# Patient Record
Sex: Male | Born: 1975 | Race: Black or African American | Hispanic: No | Marital: Single | Smoking: Current some day smoker
Health system: Southern US, Community
[De-identification: ages and names within clinical notes are randomized; demographics above are authoritative.]

## PROBLEM LIST (undated history)

## (undated) DIAGNOSIS — Z91199 Patient's noncompliance with other medical treatment and regimen due to unspecified reason: Secondary | ICD-10-CM

## (undated) DIAGNOSIS — Z789 Other specified health status: Secondary | ICD-10-CM

## (undated) DIAGNOSIS — F17200 Nicotine dependence, unspecified, uncomplicated: Secondary | ICD-10-CM

## (undated) DIAGNOSIS — I10 Essential (primary) hypertension: Secondary | ICD-10-CM

## (undated) DIAGNOSIS — I6783 Posterior reversible encephalopathy syndrome: Secondary | ICD-10-CM

## (undated) HISTORY — PX: THORACOTOMY: SHX5074

## (undated) HISTORY — DX: Posterior reversible encephalopathy syndrome: I67.83

---

## 2021-10-10 ENCOUNTER — Inpatient Hospital Stay (HOSPITAL_COMMUNITY): Payer: Medicaid Other

## 2021-10-10 ENCOUNTER — Inpatient Hospital Stay (HOSPITAL_COMMUNITY)
Admission: EM | Admit: 2021-10-10 | Discharge: 2021-10-15 | DRG: 305 | Disposition: A | Discharge status: Home / Self Care | Payer: Medicaid Other | Attending: Internal Medicine | Admitting: Internal Medicine

## 2021-10-10 ENCOUNTER — Emergency Department (HOSPITAL_COMMUNITY): Payer: Medicaid Other

## 2021-10-10 ENCOUNTER — Encounter (HOSPITAL_COMMUNITY): Payer: Self-pay | Admitting: Internal Medicine

## 2021-10-10 ENCOUNTER — Other Ambulatory Visit: Payer: Self-pay

## 2021-10-10 DIAGNOSIS — F17210 Nicotine dependence, cigarettes, uncomplicated: Secondary | ICD-10-CM | POA: Diagnosis present

## 2021-10-10 DIAGNOSIS — F17200 Nicotine dependence, unspecified, uncomplicated: Secondary | ICD-10-CM | POA: Diagnosis present

## 2021-10-10 DIAGNOSIS — I161 Hypertensive emergency: Secondary | ICD-10-CM

## 2021-10-10 DIAGNOSIS — Z9114 Patient's other noncompliance with medication regimen: Secondary | ICD-10-CM | POA: Diagnosis not present

## 2021-10-10 DIAGNOSIS — R55 Syncope and collapse: Secondary | ICD-10-CM

## 2021-10-10 DIAGNOSIS — Z597 Insufficient social insurance and welfare support: Secondary | ICD-10-CM

## 2021-10-10 DIAGNOSIS — E876 Hypokalemia: Secondary | ICD-10-CM | POA: Diagnosis present

## 2021-10-10 DIAGNOSIS — G43909 Migraine, unspecified, not intractable, without status migrainosus: Secondary | ICD-10-CM | POA: Diagnosis present

## 2021-10-10 DIAGNOSIS — T39395A Adverse effect of other nonsteroidal anti-inflammatory drugs [NSAID], initial encounter: Secondary | ICD-10-CM | POA: Diagnosis present

## 2021-10-10 DIAGNOSIS — Z79899 Other long term (current) drug therapy: Secondary | ICD-10-CM

## 2021-10-10 DIAGNOSIS — F1011 Alcohol abuse, in remission: Secondary | ICD-10-CM | POA: Diagnosis not present

## 2021-10-10 DIAGNOSIS — R1110 Vomiting, unspecified: Secondary | ICD-10-CM

## 2021-10-10 DIAGNOSIS — N186 End stage renal disease: Secondary | ICD-10-CM

## 2021-10-10 DIAGNOSIS — K2970 Gastritis, unspecified, without bleeding: Secondary | ICD-10-CM | POA: Diagnosis present

## 2021-10-10 DIAGNOSIS — D696 Thrombocytopenia, unspecified: Secondary | ICD-10-CM

## 2021-10-10 DIAGNOSIS — F1010 Alcohol abuse, uncomplicated: Secondary | ICD-10-CM | POA: Diagnosis present

## 2021-10-10 DIAGNOSIS — M5412 Radiculopathy, cervical region: Secondary | ICD-10-CM

## 2021-10-10 DIAGNOSIS — I129 Hypertensive chronic kidney disease with stage 1 through stage 4 chronic kidney disease, or unspecified chronic kidney disease: Secondary | ICD-10-CM | POA: Diagnosis present

## 2021-10-10 DIAGNOSIS — R778 Other specified abnormalities of plasma proteins: Secondary | ICD-10-CM

## 2021-10-10 DIAGNOSIS — R7989 Other specified abnormal findings of blood chemistry: Secondary | ICD-10-CM | POA: Diagnosis present

## 2021-10-10 DIAGNOSIS — F1290 Cannabis use, unspecified, uncomplicated: Secondary | ICD-10-CM

## 2021-10-10 DIAGNOSIS — R9431 Abnormal electrocardiogram [ECG] [EKG]: Secondary | ICD-10-CM

## 2021-10-10 DIAGNOSIS — Z791 Long term (current) use of non-steroidal anti-inflammatories (NSAID): Secondary | ICD-10-CM | POA: Diagnosis not present

## 2021-10-10 DIAGNOSIS — Z7982 Long term (current) use of aspirin: Secondary | ICD-10-CM

## 2021-10-10 DIAGNOSIS — I160 Hypertensive urgency: Secondary | ICD-10-CM | POA: Diagnosis not present

## 2021-10-10 DIAGNOSIS — Z91018 Allergy to other foods: Secondary | ICD-10-CM | POA: Diagnosis not present

## 2021-10-10 DIAGNOSIS — R1013 Epigastric pain: Secondary | ICD-10-CM | POA: Diagnosis present

## 2021-10-10 DIAGNOSIS — D649 Anemia, unspecified: Secondary | ICD-10-CM | POA: Diagnosis present

## 2021-10-10 DIAGNOSIS — Z992 Dependence on renal dialysis: Secondary | ICD-10-CM

## 2021-10-10 DIAGNOSIS — G4440 Drug-induced headache, not elsewhere classified, not intractable: Secondary | ICD-10-CM | POA: Diagnosis present

## 2021-10-10 DIAGNOSIS — N184 Chronic kidney disease, stage 4 (severe): Secondary | ICD-10-CM | POA: Diagnosis present

## 2021-10-10 DIAGNOSIS — N289 Disorder of kidney and ureter, unspecified: Secondary | ICD-10-CM

## 2021-10-10 DIAGNOSIS — R200 Anesthesia of skin: Secondary | ICD-10-CM

## 2021-10-10 DIAGNOSIS — N179 Acute kidney failure, unspecified: Secondary | ICD-10-CM | POA: Diagnosis present

## 2021-10-10 DIAGNOSIS — Z20822 Contact with and (suspected) exposure to covid-19: Secondary | ICD-10-CM | POA: Diagnosis present

## 2021-10-10 DIAGNOSIS — Z8249 Family history of ischemic heart disease and other diseases of the circulatory system: Secondary | ICD-10-CM | POA: Diagnosis not present

## 2021-10-10 DIAGNOSIS — N1832 Chronic kidney disease, stage 3b: Secondary | ICD-10-CM | POA: Diagnosis present

## 2021-10-10 HISTORY — DX: Hypertensive emergency: I16.1

## 2021-10-10 HISTORY — DX: End stage renal disease: Z99.2

## 2021-10-10 HISTORY — DX: Abnormal electrocardiogram (ECG) (EKG): R94.31

## 2021-10-10 HISTORY — DX: Other specified health status: Z78.9

## 2021-10-10 HISTORY — DX: Nicotine dependence, unspecified, uncomplicated: F17.200

## 2021-10-10 HISTORY — DX: Vomiting, unspecified: R11.10

## 2021-10-10 HISTORY — DX: Other specified abnormalities of plasma proteins: R77.8

## 2021-10-10 HISTORY — DX: Syncope and collapse: R55

## 2021-10-10 HISTORY — DX: Hypokalemia: E87.6

## 2021-10-10 HISTORY — DX: Other specified abnormal findings of blood chemistry: R79.89

## 2021-10-10 HISTORY — DX: Patient's noncompliance with other medical treatment and regimen due to unspecified reason: Z91.199

## 2021-10-10 HISTORY — DX: Acute kidney failure, unspecified: N17.9

## 2021-10-10 HISTORY — DX: Long term (current) use of non-steroidal anti-inflammatories (nsaid): Z79.1

## 2021-10-10 HISTORY — DX: Alcohol abuse, in remission: F10.11

## 2021-10-10 HISTORY — DX: Thrombocytopenia, unspecified: D69.6

## 2021-10-10 HISTORY — DX: Essential (primary) hypertension: I10

## 2021-10-10 HISTORY — DX: End stage renal disease: N18.6

## 2021-10-10 HISTORY — DX: Cannabis use, unspecified, uncomplicated: F12.90

## 2021-10-10 HISTORY — DX: Epigastric pain: R10.13

## 2021-10-10 HISTORY — DX: Anesthesia of skin: R20.0

## 2021-10-10 LAB — ETHANOL: Alcohol, Ethyl (B): <10 mg/dL (ref <10)

## 2021-10-10 LAB — HEMOGLOBIN A1C
Hgb A1c MFr Bld: 4.7 % — ABNORMAL LOW (ref 4.8–5.6)
Mean Plasma Glucose: 88.19 mg/dL

## 2021-10-10 LAB — I-STAT CHEM 8, ED
BUN: 20 mg/dL (ref 6–20)
Calcium, Ion: 1.18 mmol/L (ref 1.15–1.40)
Chloride: 96 mmol/L — ABNORMAL LOW (ref 98–111)
Creatinine, Ser: 2.3 mg/dL — ABNORMAL HIGH (ref 0.61–1.24)
Glucose, Bld: 175 mg/dL — ABNORMAL HIGH (ref 70–99)
HCT: 34 % — ABNORMAL LOW (ref 39.0–52.0)
Hemoglobin: 11.6 g/dL — ABNORMAL LOW (ref 13.0–17.0)
Potassium: 2.7 mmol/L — CL (ref 3.5–5.1)
Sodium: 137 mmol/L (ref 135–145)
TCO2: 27 mmol/L (ref 22–32)

## 2021-10-10 LAB — HEPATIC FUNCTION PANEL
ALT: 13 U/L (ref 0–44)
AST: 26 U/L (ref 15–41)
Albumin: 4.1 g/dL (ref 3.5–5.0)
Alkaline Phosphatase: 105 U/L (ref 38–126)
Bilirubin, Direct: 0.1 mg/dL (ref 0.0–0.2)
Indirect Bilirubin: 1 mg/dL — ABNORMAL HIGH (ref 0.3–0.9)
Total Bilirubin: 1.1 mg/dL (ref 0.3–1.2)
Total Protein: 8.7 g/dL — ABNORMAL HIGH (ref 6.5–8.1)

## 2021-10-10 LAB — COMPREHENSIVE METABOLIC PANEL
ALT: 13 U/L (ref 0–44)
AST: 25 U/L (ref 15–41)
Albumin: 3.9 g/dL (ref 3.5–5.0)
Alkaline Phosphatase: 98 U/L (ref 38–126)
Anion gap: 9 (ref 5–15)
BUN: 22 mg/dL — ABNORMAL HIGH (ref 6–20)
CO2: 26 mmol/L (ref 22–32)
Calcium: 8.8 mg/dL — ABNORMAL LOW (ref 8.9–10.3)
Chloride: 101 mmol/L (ref 98–111)
Creatinine, Ser: 2.31 mg/dL — ABNORMAL HIGH (ref 0.61–1.24)
GFR, Estimated: 35 mL/min — ABNORMAL LOW (ref >60)
Glucose, Bld: 131 mg/dL — ABNORMAL HIGH (ref 70–99)
Potassium: 3 mmol/L — ABNORMAL LOW (ref 3.5–5.1)
Sodium: 136 mmol/L (ref 135–145)
Total Bilirubin: 1.1 mg/dL (ref 0.3–1.2)
Total Protein: 7.9 g/dL (ref 6.5–8.1)

## 2021-10-10 LAB — RESP PANEL BY RT-PCR (FLU A&B, COVID) ARPGX2
Influenza A by PCR: NEGATIVE
Influenza B by PCR: NEGATIVE
SARS Coronavirus 2 by RT PCR: NEGATIVE

## 2021-10-10 LAB — RAPID URINE DRUG SCREEN, HOSP PERFORMED
Amphetamines: NOT DETECTED
Barbiturates: NOT DETECTED
Benzodiazepines: NOT DETECTED
Cocaine: NOT DETECTED
Opiates: NOT DETECTED
Tetrahydrocannabinol: POSITIVE — AB

## 2021-10-10 LAB — CBC WITH DIFFERENTIAL/PLATELET
Abs Immature Granulocytes: 0.12 10*3/uL — ABNORMAL HIGH (ref 0.00–0.07)
Basophils Absolute: 0 10*3/uL (ref 0.0–0.1)
Basophils Relative: 0 %
Eosinophils Absolute: 0.2 10*3/uL (ref 0.0–0.5)
Eosinophils Relative: 2 %
HCT: 32.4 % — ABNORMAL LOW (ref 39.0–52.0)
Hemoglobin: 10.8 g/dL — ABNORMAL LOW (ref 13.0–17.0)
Immature Granulocytes: 1 %
Lymphocytes Relative: 22 %
Lymphs Abs: 2.1 10*3/uL (ref 0.7–4.0)
MCH: 28.9 pg (ref 26.0–34.0)
MCHC: 33.3 g/dL (ref 30.0–36.0)
MCV: 86.6 fL (ref 80.0–100.0)
Monocytes Absolute: 1.1 10*3/uL — ABNORMAL HIGH (ref 0.1–1.0)
Monocytes Relative: 11 %
Neutro Abs: 6.3 10*3/uL (ref 1.7–7.7)
Neutrophils Relative %: 64 %
Platelets: 123 10*3/uL — ABNORMAL LOW (ref 150–400)
RBC: 3.74 MIL/uL — ABNORMAL LOW (ref 4.22–5.81)
RDW: 17.2 % — ABNORMAL HIGH (ref 11.5–15.5)
WBC: 9.9 10*3/uL (ref 4.0–10.5)
nRBC: 0 % (ref 0.0–0.2)

## 2021-10-10 LAB — RETICULOCYTES
Immature Retic Fract: 34.1 % — ABNORMAL HIGH (ref 2.3–15.9)
RBC.: 3.32 MIL/uL — ABNORMAL LOW (ref 4.22–5.81)
Retic Count, Absolute: 145.4 10*3/uL (ref 19.0–186.0)
Retic Ct Pct: 4.4 % — ABNORMAL HIGH (ref 0.4–3.1)

## 2021-10-10 LAB — FERRITIN: Ferritin: 147 ng/mL (ref 24–336)

## 2021-10-10 LAB — TROPONIN I (HIGH SENSITIVITY)
Troponin I (High Sensitivity): 94 ng/L — ABNORMAL HIGH (ref <18)
Troponin I (High Sensitivity): 85 ng/L — ABNORMAL HIGH (ref <18)

## 2021-10-10 LAB — TSH: TSH: 1.002 u[IU]/mL (ref 0.350–4.500)

## 2021-10-10 LAB — IRON AND TIBC
Iron: 49 ug/dL (ref 45–182)
Saturation Ratios: 16 % — ABNORMAL LOW (ref 17.9–39.5)
TIBC: 303 ug/dL (ref 250–450)
UIBC: 254 ug/dL

## 2021-10-10 LAB — LIPASE, BLOOD: Lipase: 26 U/L (ref 11–51)

## 2021-10-10 LAB — PROTIME-INR
INR: 1.1 (ref 0.8–1.2)
Prothrombin Time: 14.5 seconds (ref 11.4–15.2)

## 2021-10-10 LAB — VITAMIN B12: Vitamin B-12: 374 pg/mL (ref 180–914)

## 2021-10-10 LAB — HIV ANTIBODY (ROUTINE TESTING W REFLEX): HIV Screen 4th Generation wRfx: NONREACTIVE

## 2021-10-10 LAB — MAGNESIUM: Magnesium: 2 mg/dL (ref 1.7–2.4)

## 2021-10-10 LAB — D-DIMER, QUANTITATIVE: D-Dimer, Quant: 1.11 ug/mL-FEU — ABNORMAL HIGH (ref 0.00–0.50)

## 2021-10-10 MED ORDER — NITROGLYCERIN IN D5W 200-5 MCG/ML-% IV SOLN
0.0000 ug/min | INTRAVENOUS | Status: DC
  Administered 2021-10-10: 5 ug/min via INTRAVENOUS
  Filled 2021-10-10: qty 250

## 2021-10-10 MED ORDER — POTASSIUM CHLORIDE CRYS ER 20 MEQ PO TBCR
40.0000 meq | EXTENDED_RELEASE_TABLET | Freq: Once | ORAL | Status: AC
  Administered 2021-10-10: 40 meq via ORAL
  Filled 2021-10-10: qty 2

## 2021-10-10 MED ORDER — LABETALOL HCL 5 MG/ML IV SOLN
10.0000 mg | INTRAVENOUS | Status: DC | PRN
  Administered 2021-10-11 – 2021-10-12 (×6): 10 mg via INTRAVENOUS
  Filled 2021-10-10 (×8): qty 4

## 2021-10-10 MED ORDER — VITAMIN B-12 1000 MCG PO TABS
1000.0000 ug | ORAL_TABLET | Freq: Every day | ORAL | Status: DC
  Administered 2021-10-10 – 2021-10-15 (×6): 1000 ug via ORAL
  Filled 2021-10-10 (×6): qty 1

## 2021-10-10 MED ORDER — POTASSIUM CHLORIDE 10 MEQ/100ML IV SOLN
10.0000 meq | INTRAVENOUS | Status: AC
  Administered 2021-10-10 (×2): 10 meq via INTRAVENOUS
  Filled 2021-10-10 (×2): qty 100

## 2021-10-10 MED ORDER — CHLORHEXIDINE GLUCONATE CLOTH 2 % EX PADS
6.0000 | MEDICATED_PAD | Freq: Every day | CUTANEOUS | Status: DC
  Administered 2021-10-10 – 2021-10-11 (×2): 6 via TOPICAL

## 2021-10-10 MED ORDER — OXYCODONE HCL 5 MG PO TABS
5.0000 mg | ORAL_TABLET | ORAL | Status: DC | PRN
  Administered 2021-10-11 – 2021-10-15 (×9): 5 mg via ORAL
  Filled 2021-10-10 (×9): qty 1

## 2021-10-10 MED ORDER — DIPHENHYDRAMINE HCL 50 MG/ML IJ SOLN
25.0000 mg | Freq: Once | INTRAMUSCULAR | Status: AC
  Administered 2021-10-10: 25 mg via INTRAVENOUS
  Filled 2021-10-10: qty 1

## 2021-10-10 MED ORDER — FENTANYL CITRATE PF 50 MCG/ML IJ SOSY
100.0000 ug | PREFILLED_SYRINGE | INTRAMUSCULAR | Status: DC | PRN
  Administered 2021-10-10 (×2): 100 ug via INTRAVENOUS
  Filled 2021-10-10 (×2): qty 2

## 2021-10-10 MED ORDER — ACETAMINOPHEN 650 MG RE SUPP: 650.0000 mg | Freq: Four times a day (QID) | RECTAL | Status: DC | PRN

## 2021-10-10 MED ORDER — KETOROLAC TROMETHAMINE 30 MG/ML IJ SOLN
15.0000 mg | Freq: Once | INTRAMUSCULAR | Status: AC
  Administered 2021-10-10: 15 mg via INTRAVENOUS
  Filled 2021-10-10: qty 1

## 2021-10-10 MED ORDER — PANTOPRAZOLE SODIUM 40 MG IV SOLR: 40.0000 mg | Freq: Once | INTRAVENOUS | Status: DC

## 2021-10-10 MED ORDER — B COMPLEX-C PO TABS
1.0000 | ORAL_TABLET | Freq: Every day | ORAL | Status: DC
  Administered 2021-10-10 – 2021-10-15 (×6): 1 via ORAL
  Filled 2021-10-10 (×6): qty 1

## 2021-10-10 MED ORDER — ACETAMINOPHEN 325 MG PO TABS
650.0000 mg | ORAL_TABLET | Freq: Four times a day (QID) | ORAL | Status: DC | PRN
  Administered 2021-10-12: 650 mg via ORAL
  Filled 2021-10-10: qty 2

## 2021-10-10 MED ORDER — PANTOPRAZOLE SODIUM 40 MG PO TBEC: 40.0000 mg | DELAYED_RELEASE_TABLET | Freq: Every day | ORAL | Status: DC

## 2021-10-10 MED ORDER — NICOTINE 14 MG/24HR TD PT24
14.0000 mg | MEDICATED_PATCH | Freq: Every day | TRANSDERMAL | Status: DC
  Administered 2021-10-14 – 2021-10-15 (×2): 14 mg via TRANSDERMAL
  Filled 2021-10-10 (×4): qty 1

## 2021-10-10 MED ORDER — PANTOPRAZOLE SODIUM 40 MG IV SOLR
40.0000 mg | Freq: Two times a day (BID) | INTRAVENOUS | Status: DC
  Administered 2021-10-10 – 2021-10-13 (×6): 40 mg via INTRAVENOUS
  Filled 2021-10-10 (×5): qty 40

## 2021-10-10 MED ORDER — HYDRALAZINE HCL 50 MG PO TABS
50.0000 mg | ORAL_TABLET | Freq: Three times a day (TID) | ORAL | Status: DC
  Administered 2021-10-10 – 2021-10-11 (×3): 50 mg via ORAL
  Filled 2021-10-10: qty 1
  Filled 2021-10-10: qty 2
  Filled 2021-10-10: qty 1

## 2021-10-10 MED ORDER — POTASSIUM CHLORIDE CRYS ER 20 MEQ PO TBCR: 40.0000 meq | EXTENDED_RELEASE_TABLET | ORAL | Status: DC

## 2021-10-10 MED ORDER — HYDROMORPHONE HCL 1 MG/ML IJ SOLN
0.5000 mg | INTRAMUSCULAR | Status: DC | PRN
  Administered 2021-10-10 – 2021-10-12 (×5): 0.5 mg via INTRAVENOUS
  Filled 2021-10-10 (×5): qty 1

## 2021-10-10 MED ORDER — HYDROMORPHONE HCL 1 MG/ML IJ SOLN
1.0000 mg | Freq: Once | INTRAMUSCULAR | Status: AC
  Administered 2021-10-10: 1 mg via INTRAVENOUS
  Filled 2021-10-10: qty 1

## 2021-10-10 MED ORDER — BUTALBITAL-APAP-CAFFEINE 50-325-40 MG PO TABS
1.0000 | ORAL_TABLET | ORAL | Status: DC | PRN
  Administered 2021-10-11 – 2021-10-14 (×7): 1 via ORAL
  Filled 2021-10-10 (×7): qty 1

## 2021-10-10 MED ORDER — ONDANSETRON HCL 4 MG/2ML IJ SOLN
4.0000 mg | Freq: Once | INTRAMUSCULAR | Status: AC
  Administered 2021-10-10: 4 mg via INTRAVENOUS
  Filled 2021-10-10: qty 2

## 2021-10-10 MED ORDER — TRAZODONE HCL 50 MG PO TABS
50.0000 mg | ORAL_TABLET | Freq: Every day | ORAL | Status: DC
  Administered 2021-10-10 – 2021-10-12 (×3): 50 mg via ORAL
  Filled 2021-10-10 (×3): qty 1

## 2021-10-10 MED ORDER — MUPIROCIN 2 % EX OINT: 1.0000 "application " | TOPICAL_OINTMENT | Freq: Two times a day (BID) | CUTANEOUS | Status: DC

## 2021-10-10 MED ORDER — AMLODIPINE BESYLATE 5 MG PO TABS
5.0000 mg | ORAL_TABLET | Freq: Every day | ORAL | Status: DC
  Administered 2021-10-10 – 2021-10-11 (×2): 5 mg via ORAL
  Filled 2021-10-10 (×2): qty 1

## 2021-10-10 MED ORDER — LACTATED RINGERS IV SOLN: INTRAVENOUS | Status: DC

## 2021-10-10 MED ORDER — LABETALOL HCL 5 MG/ML IV SOLN
10.0000 mg | Freq: Once | INTRAVENOUS | Status: AC
  Administered 2021-10-10: 10 mg via INTRAVENOUS
  Filled 2021-10-10: qty 4

## 2021-10-10 MED ORDER — CHLORHEXIDINE GLUCONATE CLOTH 2 % EX PADS: 6.0000 | MEDICATED_PAD | Freq: Every day | CUTANEOUS | Status: DC

## 2021-10-10 MED ORDER — PROCHLORPERAZINE EDISYLATE 10 MG/2ML IJ SOLN
10.0000 mg | Freq: Once | INTRAMUSCULAR | Status: AC
  Administered 2021-10-10: 10 mg via INTRAVENOUS
  Filled 2021-10-10: qty 2

## 2021-10-10 NOTE — Assessment & Plan Note (Addendum)
Likely in the setting of history of alcohol use. No evidence of schistocytes. Monitor for now.

## 2021-10-10 NOTE — Assessment & Plan Note (Addendum)
Potassium level stable now. Monitor.

## 2021-10-10 NOTE — Assessment & Plan Note (Signed)
Daily use. Monitor.

## 2021-10-10 NOTE — Assessment & Plan Note (Addendum)
Patient has prior history of hypertension, ran out of his medication in September 2022 as he moved from his [place] to [place]. Systolic BP 449/201 on admission with severe headache, elevated troponin and epigastric pain as well as EKG changes. No blurred vision right now. No focal deficit at the time of my evaluation. Patient was started on IV nitroglycerin drip, discontinued on 1/11. Currently started on IV labetalol as needed, ordered Norvasc and hydralazine on a scheduled basis. UDS negative for cocaine. Appreciate cardiology assistance. Nephrology consulted as well for further assistance. Currently working up for secondary causes.  Continue Norvasc, continue Coreg, hydralazine Aldactone. Added clonidine. Patient is concerned with medication causing headache and therefore Isordil discontinued. Continue as needed labetalol.

## 2021-10-10 NOTE — ED Provider Notes (Signed)
[facility] DEPT Provider Note   CSN: [id number] Arrival date & time: 10/10/21  1025     History  Chief Complaint  Patient presents with   Hypertension   Headache    [facility] is a 46 y.o. male.  HPI He came here by private vehicle today to be evaluated for a period Randolph near syncope which occurred as he was trying to work.  He has chronic ongoing pain and a numb sensation Randolph his left arm.  This has been present for over 3 weeks.  No known trauma.  He is supposed to be on antihypertensives but has not had any in several months.  He denies having chest pain at this time.  He does feel short Randolph breath at rest.  He denies cough.  He is a smoker and drinks alcohol occasionally.  He denies use Randolph cocaine or amphetamines.  He denies vomiting, fever, chills, weakness or dizziness.    Home Medications Prior to Admission medications   Medication Sig Start Date End Date Taking? Authorizing Provider  acetaminophen (TYLENOL) 500 MG tablet Take 1,000 mg by mouth every 6 (six) hours as needed for mild pain.   Yes [provider]  aspirin-acetaminophen-caffeine (EXCEDRIN MIGRAINE) [id number] MG tablet Take 2 tablets by mouth every 6 (six) hours as needed for headache.   Yes [provider]  ibuprofen (ADVIL) 200 MG tablet Take 600-800 mg by mouth every 6 (six) hours as needed for mild pain or headache.   Yes [provider]  naproxen sodium (ALEVE) 220 MG tablet Take 440 mg by mouth daily as needed (pain).   Yes [provider]  Phenyleph-Doxylamine-DM-APAP (NYQUIL SEVERE COLD/FLU) 5-6.25-10-325 MG/15ML LIQD Take 15 mLs by mouth every 6 (six) hours as needed (cold symptoms).   Yes [provider]      Allergies    Tomato (diagnostic) and Wild lettuce [lactuca virosa]    Review Randolph Systems   Review Randolph Systems  All other systems reviewed and are negative.  Physical Exam Updated Vital Signs BP (!) 158/100    Pulse  81    Temp (!) 97.5 F (36.4 C) (Oral)    Resp 16    Ht 5' 9.5" (1.765 m)    Wt 77.1 kg    SpO2 100%    BMI 24.74 kg/m  Physical Exam Vitals and nursing note reviewed.  Constitutional:      General: He is not in acute distress.    Appearance: He is well-developed. He is not ill-appearing or diaphoretic.  HENT:     Head: Normocephalic and atraumatic.     Right Ear: External ear normal.     Left Ear: External ear normal.     Nose: No congestion.  Eyes:     Conjunctiva/sclera: Conjunctivae normal.     Pupils: Pupils are equal, round, and reactive to light.  Neck:     Trachea: Phonation normal.  Cardiovascular:     Rate and Rhythm: Normal rate and regular rhythm.     Heart sounds: Normal heart sounds.  Pulmonary:     Effort: Pulmonary effort is normal.     Breath sounds: Normal breath sounds.  Abdominal:     General: There is no distension.     Palpations: Abdomen is soft.     Tenderness: There is no abdominal tenderness.  Musculoskeletal:        General: Normal range Randolph motion.     Cervical back: Normal range Randolph motion  and neck supple.     Right lower leg: No edema.     Left lower leg: No edema.     Comments: Left arm is tender primarily in the trapezius and lateral forearm regions.  No deformities Randolph these areas.  He guards against moving the left arm and the neck secondary to pain.  The neck is nontender to palpation.  Skin:    General: Skin is warm and dry.  Neurological:     Mental Status: He is alert and oriented to person, place, and time.     Cranial Nerves: No cranial nerve deficit.     Sensory: No sensory deficit.     Motor: No abnormal muscle tone.     Coordination: Coordination normal.     Comments: No dysarthria or aphasia.  Intact sensation in the hands bilaterally.  Psychiatric:        Mood and Affect: Mood normal.        Behavior: Behavior normal.        Thought Content: Thought content normal.        Judgment: Judgment normal.    ED Results / Procedures  / Treatments   Labs (all labs ordered are listed, but only abnormal results are displayed) Labs Reviewed  CBC WITH DIFFERENTIAL/PLATELET - Abnormal; Notable for the following components:      Result Value   RBC 3.74 (*)    Hemoglobin 10.8 (*)    HCT 32.4 (*)    RDW 17.2 (*)    Platelets 123 (*)    Monocytes Absolute 1.1 (*)    Abs Immature Granulocytes 0.12 (*)    All other components within normal limits  HEPATIC FUNCTION PANEL - Abnormal; Notable for the following components:   Total Protein 8.7 (*)    Indirect Bilirubin 1.0 (*)    All other components within normal limits  I-STAT CHEM 8, ED - Abnormal; Notable for the following components:   Potassium 2.7 (*)    Chloride 96 (*)    Creatinine, Ser 2.30 (*)    Glucose, Bld 175 (*)    Hemoglobin 11.6 (*)    HCT 34.0 (*)    All other components within normal limits  TROPONIN I (HIGH SENSITIVITY) - Abnormal; Notable for the following components:   Troponin I (High Sensitivity) 94 (*)    All other components within normal limits  TROPONIN I (HIGH SENSITIVITY) - Abnormal; Notable for the following components:   Troponin I (High Sensitivity) 85 (*)    All other components within normal limits  RESP PANEL BY RT-PCR (FLU A&B, COVID) ARPGX2  ETHANOL    EKG EKG Interpretation  Date/Time:  Wednesday October 10 2021 10:36:00 EST Ventricular Rate:  64 PR Interval:  127 QRS Duration: 103 QT Interval:  450 QTC Calculation: 465 R Axis:   74 Text Interpretation: Sinus rhythm Probable left atrial enlargement LVH with secondary repolarization abnormality ST depr, consider ischemia, inferior leads Anterior ST elevation, probably due to LVH No old tracing to compare Confirmed by Daleen Bo [phone]) on 10/10/2021 11:10:41 AM    Radiology CT Head Wo Contrast  Result Date: 10/10/2021 CLINICAL DATA:  Neuro deficit, stroke suspected EXAM: CT HEAD WITHOUT CONTRAST TECHNIQUE: Contiguous axial images were obtained from the base Randolph the  skull through the vertex without intravenous contrast. RADIATION DOSE REDUCTION: This exam was performed according to the departmental dose-optimization program which includes automated exposure control, adjustment Randolph the mA and/or kV according to patient size and/or use Randolph iterative  reconstruction technique. COMPARISON:  None. FINDINGS: Brain: There is no evidence Randolph acute intracranial hemorrhage, extra-axial fluid collection, or acute infarct. Parenchymal volume is normal.  The ventricles are normal in size. There is no mass lesion.  There is no midline shift. Vascular: No hyperdense vessel or unexpected calcification. Skull: Normal. Negative for fracture or focal lesion. Sinuses/Orbits: The imaged paranasal sinuses are clear. The globes and orbits are unremarkable. Other: None. IMPRESSION: Normal head CT. Electronically Signed   By: Valetta Mole M.D.   On: 10/10/2021 12:19   CT Cervical Spine Wo Contrast  Result Date: 10/10/2021 CLINICAL DATA:  Cervical radiculopathy EXAM: CT CERVICAL SPINE WITHOUT CONTRAST TECHNIQUE: Multidetector CT imaging Randolph the cervical spine was performed without intravenous contrast. Multiplanar CT image reconstructions were also generated. RADIATION DOSE REDUCTION: This exam was performed according to the departmental dose-optimization program which includes automated exposure control, adjustment Randolph the mA and/or kV according to patient size and/or use Randolph iterative reconstruction technique. COMPARISON:  None. FINDINGS: Alignment: There is straightening Randolph the normal cervical spine lordosis. There is no antero or retrolisthesis. Skull base and vertebrae: Vertebral body heights are preserved. Skull base alignment is normal. There is no evidence Randolph acute fracture. There is no suspicious osseous lesion. Soft tissues and spinal canal: No prevertebral fluid or swelling. No visible canal hematoma. Disc levels: There is mild disc space narrowing with associated degenerative endplate change at  E9-H3. There is mild left worse than right facet arthropathy at this level resulting in mild bilateral neural foraminal stenosis without significant spinal canal stenosis. There is mild left neural foraminal stenosis at C6-C7. There is no significant spinal canal or neural foraminal stenosis at the remaining levels. Upper chest: There is mild scarring in the lung apices. Other: None. IMPRESSION: 1. Mild degenerative changes at C5-C6 and C6-C7 resulting in mild right worse than left neural foraminal stenosis at C5-C6 and mild left neural foraminal stenosis at C6-C7. No high-grade spinal canal or neural foraminal stenosis. 2. No acute findings. Electronically Signed   By: Valetta Mole M.D.   On: 10/10/2021 12:23   DG Chest Port 1 View  Result Date: 10/10/2021 CLINICAL DATA:  Shortness Randolph breath over the last few days. EXAM: PORTABLE CHEST 1 VIEW COMPARISON:  None. FINDINGS: Artifact overlies the chest. Heart and mediastinal shadows are normal. The lungs are clear. The vascularity is normal. No significant bone finding. IMPRESSION: No active disease. Electronically Signed   By: Nelson Chimes M.D.   On: 10/10/2021 11:38    Procedures .Critical Care Performed by: Daleen Bo, MD Authorized by: Daleen Bo, MD   Critical care provider statement:    Critical care time (minutes):  75   Critical care start time:  10/10/2021 10:45 AM   Critical care end time:  10/10/2021 4:37 PM   Critical care time was exclusive Randolph:  Separately billable procedures and treating other patients   Critical care was necessary to treat or prevent imminent or life-threatening deterioration Randolph the following conditions:  Circulatory failure   Critical care was time spent personally by me on the following activities:  Blood draw for specimens, development Randolph treatment plan with patient or surrogate, discussions with consultants, evaluation Randolph patient's response to treatment, examination Randolph patient, ordering and performing  treatments and interventions, ordering and review Randolph laboratory studies, ordering and review Randolph radiographic studies, pulse oximetry, re-evaluation Randolph patient's condition and review Randolph old charts    Medications Ordered in ED Medications  fentaNYL (SUBLIMAZE) injection 100 mcg (  100 mcg Intravenous Given 10/10/21 1412)  nitroGLYCERIN 50 mg in dextrose 5 % 250 mL (0.2 mg/mL) infusion (111 mcg/min Intravenous Rate/Dose Change 10/10/21 1425)  potassium chloride SA (KLOR-CON M) CR tablet 40 mEq (40 mEq Oral Given 10/10/21 1133)  potassium chloride 10 mEq in 100 mL IVPB (0 mEq Intravenous Stopped 10/10/21 1346)  HYDROmorphone (DILAUDID) injection 1 mg (1 mg Intravenous Given 10/10/21 1620)  ketorolac (TORADOL) 30 MG/ML injection 15 mg (15 mg Intravenous Given 10/10/21 1621)    ED Course/ Medical Decision Making/ A&P Clinical Course as Randolph 10/10/21 1631  Wed Oct 10, 2021  1049 I discussed the case and the EKG result with the on-call STEMI consultant, Dr. Claiborne Billings.  He does not think EKG to get STEMI and recommends treatment and evaluation starting in the ED, and that the patient will ultimately need a cardiac echo.  [EW]  1108 Patient presenting with concerning symptoms, vital signs and EKG.  Doubt STEMI based on clinical history and observation Randolph EKG.  We will proceed initially with laboratory testing, chest x-ray, and CT head prior to initiating treatment for suspected hypertensive urgency. [EW]    Clinical Course User Index [EW] Daleen Bo, MD                           Medical Decision Making   Patient Vitals for the past 24 hrs:  BP Temp Temp src Pulse Resp SpO2 Height Weight  10/10/21 1620 (!) 158/100 -- -- 81 -- 100 % -- --  10/10/21 1608 (!) 161/98 -- -- 87 16 100 % -- --  10/10/21 1600 (!) 161/98 -- -- 87 -- 100 % -- --  10/10/21 1450 (!) 162/112 -- -- 75 16 94 % -- --  10/10/21 1440 (!) 163/107 -- -- 69 -- 90 % -- --  10/10/21 1430 (!) 175/118 -- -- 84 -- 99 % -- --  10/10/21 1420 (!)  162/112 -- -- 80 -- 94 % -- --  10/10/21 1400 (!) 156/115 -- -- 86 -- 100 % -- --  10/10/21 1330 (!) 168/124 -- -- 80 17 99 % -- --  10/10/21 1315 (!) 182/135 -- -- 90 15 100 % -- --  10/10/21 1300 (!) 188/133 -- -- 80 19 100 % -- --  10/10/21 1245 (!) 195/135 -- -- 90 (!) 21 100 % -- --  10/10/21 1239 -- -- -- -- -- -- 5' 9.5" (1.765 m) 77.1 kg  10/10/21 1230 (!) 202/142 -- -- 90 (!) 22 100 % -- --  10/10/21 1215 (!) 210/140 -- -- 83 14 100 % -- --  10/10/21 1145 (!) 195/133 -- -- 84 18 100 % -- --  10/10/21 1130 (!) 217/145 -- -- 82 14 100 % -- --  10/10/21 1115 (!) 202/124 -- -- 81 (!) 21 100 % -- --  10/10/21 1100 (!) 204/135 -- -- 80 17 100 % -- --  10/10/21 1045 -- -- -- 75 (!) 22 99 % -- --  10/10/21 1036 (!) 187/124 (!) 97.5 F (36.4 C) Oral 66 18 100 % -- --    4324 PM Reevaluation with update and discussion with patient and sister who was in the room. After initial assessment and treatment, an updated evaluation reveals he continues to complain Randolph severe headache, additional medication ordered.  He states that his neck pain has improved.  Illness risk, progressive problems with uncontrolled high blood pressure, discussed. [place]  Decision Making: Summary Randolph Illness/Injury: Patient presenting with acute headache and high blood pressure, while not taking blood pressure medications.  He ran out Randolph them.  He does not present with isolated neurologic findings consistent with acute stroke or intracranial bleeding.  He requires urgent treatment to evaluate for reversible causes, and treatment.  Critical Interventions-clinical evaluation, laboratory testing, radiography, IV fluids, medications, observation and reevaluation; to evaluate  Chief Complaint  Patient presents with   Hypertension   Headache    and assess for illness characterized as Acute, Uncertain Prognosis, Complicated, Systemic Symptoms, and Threat to Life/Bodily Function   The Differential Diagnoses  include hypertensive urgency, intracranial bleeding, complications from noncompliance with medication.  I did not require  Additional Historical Information from Anyone, as the patient is a good historian.     Clinical Laboratory Tests Ordered, included CBC, Metabolic panel, and troponin, hepatic function . Review indicates normal except potassium low, creatinine high, hemoglobin low, total protein high, troponin high but stable with DELTA check. Emergent testing abnormality management required for stabilization-yes treatment Randolph hypokalemia  Radiologic Tests Ordered, included chest x-ray, CT head, CT cervical.  I independently Visualized: Radiograph images, which show no intracranial bleeding, no heart failure or pneumonia, degenerative change lower neck, left greater than right, consistent with cause for left arm radiculopathy  Cardiac Monitor Tracing which shows normal sinus rhythm, indicating no arrhythmias    This patient is Presenting for Evaluation Randolph headache and high blood pressure, which does require a range Randolph treatment options, and is a complaint that involves a high risk Randolph morbidity and mortality.  Pharmaceutical Risk Management parenteral analgesia, parenteral nitroglycerin drip, repeat analgesic medication Parenteral Treatment  Social Determinants Randolph Health Risk noncompliance with prescribed anti-hypertensive medication  Treatment Complication Risk Evaluation indicates appropriate disposition isHospitalization for further treatment  After These Interventions, the Patient was reevaluated and was found with very high blood pressure requiring treatment.  Screening for intracranial bleeding reassuring.  Initial EKG is abnormal, most likely representing changes from chronic hypertension.  Initial troponin elevated but was lower on delta checking.  I suspect this is a troponin leak because Randolph hypertensive urgency.  He has incidental hypokalemia.  He denies use Randolph cocaine.  Patient  was treated with nitroglycerin drip in the ED with improvement Randolph his blood pressure, and his left arm discomfort but not his headache.  He is critically ill and requires admission to the stepdown unit for stabilization    4:31 PM-Consult complete with hospitalist. Patient case explained and discussed. Requirement for he agreed on. Possible Risk Randolph decompensation.  He agrees to admit patient for further evaluation and treatment. Call ended at 4:32 PM  CRITICAL CARE-yes Performed by: Daleen Bo              Final Clinical Impression(s) / ED Diagnoses Final diagnoses:  Hypertensive urgency  Hypokalemia  Renal insufficiency  Cervical radiculopathy at C5    Rx / DC Orders ED Discharge Orders     None         Daleen Bo, MD 10/10/21 1637

## 2021-10-10 NOTE — Assessment & Plan Note (Addendum)
Likely from an NSAID induced gastritis. Continue scheduled PPI. Normal lipase level.  Tolerating oral diet.

## 2021-10-10 NOTE — Assessment & Plan Note (Addendum)
No prior serum creatinine to compare. Currently serum creatinine stable around 2. BUN is normal which makes me think this is chronic kidney disease stage IIIb rather than acute kidney injury. Was treated with IV hydration. Normal ultrasound renal. Nephrology consulted.  Appreciate assistance.  Outpatient follow-up recommended.  Serological work-up sent. Fena 2.2.  Suggesting intrinsic damage.  Hypoosmolar.

## 2021-10-10 NOTE — Assessment & Plan Note (Addendum)
Possibility of microangiopathic hemolytic anemia  hemoglobin significant low.  Reticulocyte count elevated. Patient denies any acute bleeding history. Iron level normal, B12 level 374, relatively low.  Haptoglobin low.  LDH elevated. Hemoglobin and platelet count both improving now. While the patient potentially may have mild hemolytic anemia, at present treatment is blood pressure control per nephrology. Continue PPI twice daily. Patient is Jehovah's Witness.  May require EPO. Will benefit from outpatient oncology referral.

## 2021-10-10 NOTE — Hospital Course (Addendum)
46 year old male with past medical history of essential hypertension, active smoker, history of alcohol abuse-sober since 1 year, from [place].  Presents with complaints of syncopal event, subacute left-sided arm numbness and epigastric pain as well as migraine.  Found to have abnormal EKG and hypertensive emergency. 1/12 cardiology consulted.  Echocardiogram completed.  Transfer out of the stepdown unit.

## 2021-10-10 NOTE — Assessment & Plan Note (Signed)
1 year ago patient was reportedly drinking 6 to 12 cans of beer every day and couple of shots every day. He only drinks occasionally at his last drink was around Christmas now.

## 2021-10-10 NOTE — Assessment & Plan Note (Addendum)
Elevated troponin. History of sudden cardiac death in family. Patient presents with complaints of epigastric pain with hypertensive emergency. EKG shows evidence of anterior ST-T wave changes concerning for anterior STEMI. ED provider discussed with Dr. Raynelle Fanning on call.  Dr. Claiborne Billings felt that the patient does not have any STEMI. Serial troponins, not consistent with ACS, also trending down. Echocardiogram shows preserved EF without any abnormality.  Shows grade 2 diastolic dysfunction Appreciate cardiology consultation.

## 2021-10-10 NOTE — Assessment & Plan Note (Signed)
1 episode in the ER after receiving pain medication. Monitor for now.

## 2021-10-10 NOTE — Assessment & Plan Note (Addendum)
Significantly heavy use of NSAID's and has any worsening or time per patient. Patient likely have medication overuse headache. NSAIDS likely responsible for his gastritis as well as possible chronic blood loss anemia and acute kidney injury versus possible chronic kidney disease. Recommend he stop using NSAID's for now.

## 2021-10-10 NOTE — Assessment & Plan Note (Signed)
Smokes 1 pack over 3 days. Nicotine patch. Currently willing to quit.

## 2021-10-10 NOTE — Assessment & Plan Note (Addendum)
Patient presents with a syncopal event. Appears to be more vasovagal event. No focal deficit.  CT head unremarkable. Echo shows no acute abnormality. Cardiology consult appreciated. Orthostatic vitals negative.

## 2021-10-10 NOTE — ED Notes (Signed)
Pt nausea/vomit x3 at this time.

## 2021-10-10 NOTE — H&P (Signed)
History and Physical    Patient: Roy Randolph:[id number] DOB: 15-Aug-1976 DOA: 10/10/2021 DOS: the patient was seen and examined on 10/10/2021 PCP: Pcp, No  Patient coming from: Home  Chief Complaint: Near syncope.  HPI: Roy Randolph is a 46 y.o. male with medical history significant of essential hypertension, active smoker, marijuana use. Patient presented with complaints of syncopal event that occurred in the morning. Patient woke up on his at his baseline.  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  After lifting a large pain patient becomes dizzy without any vertigo, and diaphoretic.  He was brought into their work [employer] where he passes out.  When he came back he was being brought to [facility], ER. At the time of my evaluation denies any complaints of dizziness or vertigo, no chest pain, no abdominal pain.  He has subacute left arm numbness and pain. He had an episode of vomiting in the ER after receiving pain medication. Denies any vomiting diarrhea at home. He has chronic migraine and takes 2-3 Excedrin 2-3 times a day for long-term. He has history of hypertension and supposed to take blood pressure medication (does not remember name) but ran out of them since September 2022 as he moved from [place] to [facility] and ever since migraine headache has worsened. For last 2 to 3 weeks reports having some numbness around his elbow joint associated with some pain which is progressively worsening. Along with the initial syncopal events he also had some epigastric pain which improved on his way to the ER.  Review of Systems: As mentioned in the history of present illness. All other systems reviewed and are negative. Past Medical History:  Diagnosis Date   Hypertension     Social History:  reports that he has been smoking cigarettes. He started smoking about 25 years ago. He has been smoking an average of .3 packs per day. He does not have any smokeless tobacco history on file. He reports that he does  not currently use alcohol after a past usage of about 12.0 standard drinks per week. He reports current drug use. Drug: Marijuana.  Allergies  Allergen Reactions   Fruit Extracts     In general all fruits and vegetable. Can eat a can of fruit.    Tomato (Diagnostic) Swelling   Wild Lettuce [Lactuca Virosa] Swelling    Family History  Problem Relation Age of Onset   Cervical cancer Mother    Breast cancer Sister    Sudden Cardiac Death Brother 32   Heart disease Maternal Grandmother    Cancer Maternal Grandmother     Prior to Admission medications   Medication Sig Start Date End Date Taking? Authorizing Provider  acetaminophen (TYLENOL) 500 MG tablet Take 1,000 mg by mouth every 6 (six) hours as needed for mild pain.   Yes [provider]  aspirin-acetaminophen-caffeine (EXCEDRIN MIGRAINE) [phone] MG tablet Take 2 tablets by mouth every 6 (six) hours as needed for headache.   Yes [provider]  ibuprofen (ADVIL) 200 MG tablet Take 600-800 mg by mouth every 6 (six) hours as needed for mild pain or headache.   Yes [provider]  naproxen sodium (ALEVE) 220 MG tablet Take 440 mg by mouth daily as needed (pain).   Yes [provider]  Phenyleph-Doxylamine-DM-APAP (NYQUIL SEVERE COLD/FLU) 5-6.25-10-325 MG/15ML LIQD Take 15 mLs by mouth every 6 (six) hours as needed (cold symptoms).   Yes [provider]    Physical Exam: Vitals:  10/10/21 1700 10/10/21 1900 10/10/21 1959 10/10/21 2003  BP: (!) 169/130 (!) 142/96  (!) 166/102  Pulse: 88 76    Resp: 16 14    Temp: 98.3 F (36.8 C)  98 F (36.7 C)   TempSrc: Oral  Oral   SpO2: 98% 99%    Weight:      Height:       Vitals:   10/10/21 1700 10/10/21 1900 10/10/21 1959 10/10/21 2003  BP: (!) 169/130 (!) 142/96  (!) 166/102  Pulse: 88 76    Resp: 16 14    Temp: 98.3 F (36.8 C)  98 F (36.7 C)   TempSrc: Oral  Oral   SpO2: 98% 99%    Weight:      Height:        General:  Appear in mild distress, no Rash; Oral Mucosa Clear, moist. no Abnormal Neck Mass Or lumps, Conjunctiva normal  Cardiovascular: S1 and S2 Present, no Murmur, Respiratory: good respiratory effort, Bilateral Air entry present and CTA, no Crackles, no wheezes Abdomen: Bowel Sound present, Soft and no tenderness Extremities: no Pedal edema Neurology: alert and oriented to time, place, and person affect appropriate. no new focal deficit Gait not checked due to patient safety concerns   Data Reviewed: EKG shows anterior ST-T wave changes concerning for acute ischemia as well as LVH. Troponins elevated trending down. Serum creatinine elevated, potassium low.  B12 low.  Hemoglobin low.  Assessment/Plan * Hypertensive emergency- (present on admission) Patient has prior history of hypertension, ran out of his medication in September 2022 as he moved from his state[place] to [place]. Systolic BP 349/179 on admission with severe headache, elevated troponin and epigastric pain as well as EKG changes. No blurred vision right now. No focal deficit at the time of my evaluation. Patient was started on IV nitroglycerin drip.  Currently drip has been discontinued. Currently started on IV labetalol as needed, ordered Norvasc and hydralazine on a scheduled basis. UDS negative for cocaine.  Syncope, vasovagal- (present on admission) Patient presents with a syncopal event. Appears to be more vasovagal event. No focal deficit.  CT head unremarkable. EKG has abnormality and patient is hypokalemic replacement is initiated and further work-up is also started. Will consult cardiology for further assistance.  Abnormal EKG- (present on admission) Elevated troponin. History of sudden cardiac death in family. Patient presents with complaints of epigastric pain with hypertensive emergency. EKG shows evidence of anterior ST-T wave changes concerning for anterior STEMI. ED provider discussed with Dr. Raynelle Fanning on  call.  Dr. Claiborne Billings felt that the patient does not have any STEMI. Serial troponins trending down. Will get echocardiogram in the morning we will consult cardiology for further assistance based on the history of sudden cardiac death in the family.  Migraine- (present on admission) Patient has history of migraine.  Progressively worsened since September 2022 once he ran out of his blood pressure medications. Does not take any medication to help with prophylaxis. Takes 2-3 Excedrin 2-3 times a day to help with his headache. At present will initiate with narcotics. Recommend long-term prophylaxis medication once his headache is better.  NSAID long-term use Significantly heavy use of NSAID's and has any worsening or time per patient. Patient likely have medication overuse headache. Anesthetics likely responsible for his gastritis as well as possible chronic blood loss anemia and acute kidney injury versus possible chronic kidney disease. Recommend he stop using NSAID's for now.   Epigastric pain- (present on admission) Likely from an  NSAID induced gastritis. Continue scheduled PPI. Check lipase level.  Vomiting- (present on admission) 1 episode in the ER after receiving pain medication. Monitor for now.  Hypokalemia- (present on admission) Mildly low.  Will replace.  AKI (acute kidney injury) ([place]) vs CKD stage IIIb- (present on admission) No prior serum creatinine to compare. Currently serum creatinine 2.31.  BUN is normal which makes me think this is chronic kidney disease stage IIIb rather than acute kidney injury. Will continue with IV hydration. Check ultrasound renal. Further renal work-up.   H/O alcohol abuse- (present on admission) 1 year ago patient was reportedly drinking 6 to 12 cans of beer every day and couple of shots every day. He only drinks occasionally at his last drink was around Christmas now.  Thrombocytopenia ([place])- (present on admission) Likely in the  setting of history of alcohol use. Monitor for now.  Normocytic anemia- (present on admission) Hemoglobin significant low.  Reticulocyte count elevated. Will maintain the patient on clear liquid diet for now. Patient denies any acute bleeding history.  Left arm numbness and pain- (present on admission) Patient reports numbness and pain involving his left upper extremity for last 3 to 4 weeks progressively worsening. CT C-spine shows evidence of mild degenerative changes without any high-grade stenosis. Will get MRI C-spine. Possibility of carpal tunnel syndrome cannot be ruled out although symptoms are involving elbow area.  Smoker- (present on admission) Smokes 1 pack over 3 days. Nicotine patch. Currently willing to quit.  Marijuana use- (present on admission) Daily use. Monitor.   Advance Care Planning:   Code Status: Full Code  Patient is Jehovah's Witness.  Does not want a blood transfusion.  If anemia is life-threatening, in that event he would like to discuss with his family/sister, to have discussion regarding blood transfusion.  Consults: Cardiology.  Family Communication: Sister at bedside.  Severity of Illness: The appropriate patient status for this patient is INPATIENT. Inpatient status is judged to be reasonable and necessary in order to provide the required intensity of service to ensure the patient's safety. The patient's presenting symptoms, physical exam findings, and initial radiographic and laboratory data in the context of their chronic comorbidities is felt to place them at high risk for further clinical deterioration. Furthermore, it is not anticipated that the patient will be medically stable for discharge from the hospital within 2 midnights of admission.   * I certify that at the point of admission it is my clinical judgment that the patient will require inpatient hospital care spanning beyond 2 midnights from the point of admission due to high intensity of  service, high risk for further deterioration and high frequency of surveillance required.*  Author: Berle Mull, MD 10/10/2021 9:21 PM  For on call review [email or web address].

## 2021-10-10 NOTE — ED Notes (Signed)
Blue, x2 Gold tops sent to labs as saves if needed.

## 2021-10-10 NOTE — Assessment & Plan Note (Addendum)
Patient has history of migraine.  Progressively worsened since September 2022 once he ran out of his blood pressure medications. Does not take any medication to help with prophylaxis. Takes 2-3 Excedrin 2-3 times a day to help with his headache. Imitrex for acute relief Fioricet as needed Opioids for severe pain Amitriptyline for prophylaxis.  Coreg and Norvasc would benefit as well.

## 2021-10-10 NOTE — Assessment & Plan Note (Addendum)
Patient reports numbness and pain involving his left upper extremity for last 3 to 4 weeks progressively worsening. CT C-spine shows evidence of mild degenerative changes without any high-grade stenosis. MRI C-spine negative for any cord compression but does show evidence of foraminal stenosis as well.   At present patient does not have any radicular symptoms.  Reports muscle spasm with his migraine episode.   Continue Robaxin. No further work-up for now outpatient follow-up with neurosurgery.

## 2021-10-10 NOTE — ED Notes (Signed)
MD at bedside. 

## 2021-10-10 NOTE — ED Notes (Signed)
Pt is a [place] witness, and has informed nurse that he spoke with the physician about refusal to accept blood. Per pt physician stated it would be added to his chart.

## 2021-10-10 NOTE — ED Notes (Signed)
EDP aware of POC results

## 2021-10-11 ENCOUNTER — Encounter (HOSPITAL_COMMUNITY): Payer: Self-pay | Admitting: Internal Medicine

## 2021-10-11 ENCOUNTER — Inpatient Hospital Stay (HOSPITAL_COMMUNITY): Payer: Medicaid Other

## 2021-10-11 DIAGNOSIS — F1011 Alcohol abuse, in remission: Secondary | ICD-10-CM

## 2021-10-11 DIAGNOSIS — R778 Other specified abnormalities of plasma proteins: Secondary | ICD-10-CM

## 2021-10-11 DIAGNOSIS — N179 Acute kidney failure, unspecified: Secondary | ICD-10-CM

## 2021-10-11 DIAGNOSIS — R55 Syncope and collapse: Secondary | ICD-10-CM

## 2021-10-11 DIAGNOSIS — I161 Hypertensive emergency: Principal | ICD-10-CM

## 2021-10-11 DIAGNOSIS — R7989 Other specified abnormal findings of blood chemistry: Secondary | ICD-10-CM

## 2021-10-11 LAB — CBC
HCT: 28.6 % — ABNORMAL LOW (ref 39.0–52.0)
Hemoglobin: 9.4 g/dL — ABNORMAL LOW (ref 13.0–17.0)
MCH: 28.8 pg (ref 26.0–34.0)
MCHC: 32.9 g/dL (ref 30.0–36.0)
MCV: 87.7 fL (ref 80.0–100.0)
Platelets: 97 10*3/uL — ABNORMAL LOW (ref 150–400)
RBC: 3.26 MIL/uL — ABNORMAL LOW (ref 4.22–5.81)
RDW: 17.6 % — ABNORMAL HIGH (ref 11.5–15.5)
WBC: 9.1 10*3/uL (ref 4.0–10.5)
nRBC: 0 % (ref 0.0–0.2)

## 2021-10-11 LAB — URINALYSIS, COMPLETE (UACMP) WITH MICROSCOPIC
Bacteria, UA: NONE SEEN
Bilirubin Urine: NEGATIVE
Glucose, UA: NEGATIVE mg/dL
Hgb urine dipstick: NEGATIVE
Ketones, ur: NEGATIVE mg/dL
Leukocytes,Ua: NEGATIVE
Nitrite: NEGATIVE
Protein, ur: 30 mg/dL — AB
Specific Gravity, Urine: 1.006 (ref 1.005–1.030)
pH: 8 (ref 5.0–8.0)

## 2021-10-11 LAB — COMPREHENSIVE METABOLIC PANEL
ALT: 10 U/L (ref 0–44)
ALT: 11 U/L (ref 0–44)
AST: 21 U/L (ref 15–41)
AST: 23 U/L (ref 15–41)
Albumin: 3.3 g/dL — ABNORMAL LOW (ref 3.5–5.0)
Albumin: 3.5 g/dL (ref 3.5–5.0)
Alkaline Phosphatase: 81 U/L (ref 38–126)
Alkaline Phosphatase: 91 U/L (ref 38–126)
Anion gap: 8 (ref 5–15)
Anion gap: 8 (ref 5–15)
BUN: 21 mg/dL — ABNORMAL HIGH (ref 6–20)
BUN: 20 mg/dL (ref 6–20)
CO2: 23 mmol/L (ref 22–32)
CO2: 25 mmol/L (ref 22–32)
Calcium: 8.5 mg/dL — ABNORMAL LOW (ref 8.9–10.3)
Calcium: 8.8 mg/dL — ABNORMAL LOW (ref 8.9–10.3)
Chloride: 103 mmol/L (ref 98–111)
Chloride: 102 mmol/L (ref 98–111)
Creatinine, Ser: 2.01 mg/dL — ABNORMAL HIGH (ref 0.61–1.24)
Creatinine, Ser: 2.11 mg/dL — ABNORMAL HIGH (ref 0.61–1.24)
GFR, Estimated: 41 mL/min — ABNORMAL LOW (ref >60)
GFR, Estimated: 39 mL/min — ABNORMAL LOW (ref >60)
Glucose, Bld: 101 mg/dL — ABNORMAL HIGH (ref 70–99)
Glucose, Bld: 108 mg/dL — ABNORMAL HIGH (ref 70–99)
Potassium: 3 mmol/L — ABNORMAL LOW (ref 3.5–5.1)
Potassium: 3.2 mmol/L — ABNORMAL LOW (ref 3.5–5.1)
Sodium: 134 mmol/L — ABNORMAL LOW (ref 135–145)
Sodium: 135 mmol/L (ref 135–145)
Total Bilirubin: 0.9 mg/dL (ref 0.3–1.2)
Total Bilirubin: 1 mg/dL (ref 0.3–1.2)
Total Protein: 6.8 g/dL (ref 6.5–8.1)
Total Protein: 7.4 g/dL (ref 6.5–8.1)

## 2021-10-11 LAB — ECHOCARDIOGRAM COMPLETE
AR max vel: 2.84 cm2
AV Area VTI: 3.25 cm2
AV Area mean vel: 2.77 cm2
AV Mean grad: 4 mmHg
AV Peak grad: 7.7 mmHg
Ao pk vel: 1.39 m/s
Area-P 1/2: 4.8 cm2
Calc EF: 56.2 %
Height: 69.5 in
MV VTI: 1.35 cm2
P 1/2 time: 1460 msec
S' Lateral: 3.5 cm
Single Plane A2C EF: 60.3 %
Single Plane A4C EF: 59.1 %
Weight: 2720 oz

## 2021-10-11 LAB — CBC WITH DIFFERENTIAL/PLATELET
Abs Immature Granulocytes: 0.1 10*3/uL — ABNORMAL HIGH (ref 0.00–0.07)
Basophils Absolute: 0 10*3/uL (ref 0.0–0.1)
Basophils Relative: 0 %
Eosinophils Absolute: 0.1 10*3/uL (ref 0.0–0.5)
Eosinophils Relative: 1 %
HCT: 26.2 % — ABNORMAL LOW (ref 39.0–52.0)
Hemoglobin: 8.6 g/dL — ABNORMAL LOW (ref 13.0–17.0)
Immature Granulocytes: 1 %
Lymphocytes Relative: 22 %
Lymphs Abs: 1.9 10*3/uL (ref 0.7–4.0)
MCH: 29 pg (ref 26.0–34.0)
MCHC: 32.8 g/dL (ref 30.0–36.0)
MCV: 88.2 fL (ref 80.0–100.0)
Monocytes Absolute: 1 10*3/uL (ref 0.1–1.0)
Monocytes Relative: 12 %
Neutro Abs: 5.7 10*3/uL (ref 1.7–7.7)
Neutrophils Relative %: 64 %
Platelets: 88 10*3/uL — ABNORMAL LOW (ref 150–400)
RBC: 2.97 MIL/uL — ABNORMAL LOW (ref 4.22–5.81)
RDW: 17.2 % — ABNORMAL HIGH (ref 11.5–15.5)
WBC: 8.9 10*3/uL (ref 4.0–10.5)
nRBC: 0 % (ref 0.0–0.2)

## 2021-10-11 LAB — MAGNESIUM: Magnesium: 1.9 mg/dL (ref 1.7–2.4)

## 2021-10-11 LAB — OSMOLALITY, URINE: Osmolality, Ur: 214 mOsm/kg — ABNORMAL LOW (ref 300–900)

## 2021-10-11 LAB — NA AND K (SODIUM & POTASSIUM), RAND UR
Potassium Urine: 11 mmol/L
Sodium, Ur: 58 mmol/L

## 2021-10-11 LAB — CREATININE, URINE, RANDOM: Creatinine, Urine: 41.34 mg/dL

## 2021-10-11 LAB — TECHNOLOGIST SMEAR REVIEW

## 2021-10-11 LAB — MRSA NEXT GEN BY PCR, NASAL: MRSA by PCR Next Gen: NOT DETECTED

## 2021-10-11 MED ORDER — SODIUM CHLORIDE 0.9% FLUSH
3.0000 mL | Freq: Two times a day (BID) | INTRAVENOUS | Status: DC
  Administered 2021-10-11 – 2021-10-15 (×9): 3 mL via INTRAVENOUS

## 2021-10-11 MED ORDER — SENNOSIDES-DOCUSATE SODIUM 8.6-50 MG PO TABS: 1.0000 | ORAL_TABLET | Freq: Every evening | ORAL | Status: DC | PRN

## 2021-10-11 MED ORDER — HYDRALAZINE HCL 50 MG PO TABS
100.0000 mg | ORAL_TABLET | Freq: Three times a day (TID) | ORAL | Status: DC
  Administered 2021-10-11 – 2021-10-15 (×12): 100 mg via ORAL
  Filled 2021-10-11 (×12): qty 2

## 2021-10-11 MED ORDER — CARVEDILOL 6.25 MG PO TABS
6.2500 mg | ORAL_TABLET | Freq: Two times a day (BID) | ORAL | Status: DC
  Administered 2021-10-11 – 2021-10-12 (×3): 6.25 mg via ORAL
  Filled 2021-10-11 (×3): qty 1

## 2021-10-11 MED ORDER — HYDRALAZINE HCL 50 MG PO TABS
50.0000 mg | ORAL_TABLET | Freq: Once | ORAL | Status: AC
  Administered 2021-10-11: 50 mg via ORAL
  Filled 2021-10-11: qty 1

## 2021-10-11 MED ORDER — HEPARIN SODIUM (PORCINE) 5000 UNIT/ML IJ SOLN: 5000.0000 [IU] | Freq: Three times a day (TID) | INTRAMUSCULAR | Status: DC

## 2021-10-11 MED ORDER — POTASSIUM CHLORIDE CRYS ER 20 MEQ PO TBCR
40.0000 meq | EXTENDED_RELEASE_TABLET | ORAL | Status: AC
  Administered 2021-10-11 (×2): 40 meq via ORAL
  Filled 2021-10-11: qty 2

## 2021-10-11 NOTE — Plan of Care (Signed)
°  Problem: Education: Goal: Knowledge of General Education information will improve Description: Including pain rating scale, medication(s)/side effects and non-pharmacologic comfort measures Outcome: Progressing   Problem: Pain Managment: Goal: General experience of comfort will improve Outcome: Progressing   Problem: Health Behavior/Discharge Planning: Goal: Ability to manage health-related needs will improve Outcome: Not Progressing   Problem: Clinical Measurements: Goal: Ability to maintain clinical measurements within normal limits will improve Outcome: Not Progressing   Problem: Nutrition: Goal: Adequate nutrition will be maintained Outcome: Not Progressing   Problem: Health Behavior/Discharge Planning: Goal: Ability to manage health-related needs will improve Outcome: Not Progressing   Problem: Clinical Measurements: Goal: Ability to maintain clinical measurements within normal limits will improve Outcome: Not Progressing   Problem: Nutrition: Goal: Adequate nutrition will be maintained Outcome: Not Progressing

## 2021-10-11 NOTE — Progress Notes (Signed)
° °  Echocardiogram 2D Echocardiogram has been performed.  Roy Randolph 10/11/2021, 8:56 AM

## 2021-10-11 NOTE — Consult Note (Signed)
Cardiology Consultation:   Patient ID: Roy Randolph MRN: [id number]; DOB: [birth year]  Admit date: 10/10/2021 Date of Consult: 10/11/2021  PCP:  Roy Randolph No   [facility] Providers Cardiologist:  Roy Casino, MD new  Patient Profile:   Roy Randolph is a 46 y.o. male with a hx of HTN, current tobacco use, THC use, hx of heavy alcohol use, and spontaneous pneumothorax on the right requiring ?pleurodesis who is being seen 10/11/2021 for the evaluation of near syncope at the request of Dr. Posey Randolph.  History of Present Illness:   Roy Randolph has no prior cardiac history. He presented to [facility] by private vehicle 10/10/21 with near syncope while at work, chronic pai and numbness in his left arm, and noncompliant on anti-hypertensives. He was in hypertensive emergency on arrival with BP  as high as 217/145. He denied chest pain. EKG concerning of LVH and ST elevation in anterior leads with TWI in V3-6. EKG was discussed with STEMI on-call MD who felt this was unlikely a STEMI and recommended medical management with echocardiogram.   He has a history of heavy alcohol use (6-12 beers daily with shots of liquor) but states his last drink was Sep 23 2021.  HS troponin 94 --> 85 EKG with sinus rhythm and ST elevation likely related to severe LVH  Echocardiogram pending final read.   During my exam, he reports running out of his blood pressure medications when he moved to [facility] in Sept 2022. He has chronic migraine headaches. 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 He was taken to the work Printmaker and syncopized with loss of consciousness. He was brought to [facility] and had once episode of emesis after pain medication.   He has a 30 pack year history of smoking.   He is not married, but has several kids, would not tell me how many. Most are in [place], two in [place]. He moved here in Sept 2021 and has not taken BP medication since then. He has not applied for medicaid in [place]. He  moved here for family (has 2 sisters here and an ?uncle who owns a Architect business) and for work. He works for the Conservation officer, historic buildings.   He states he was carrying a 5 gallon bucket of pain and became diaphoretic and lightheaded. He sat in the work truck and then passed out. He denies chest pain but reports he was having severe epigastric pain just  prior to the event. He has had chronic headache, suspect this is likely due to chronic hypertensive urgency. He also complains of ongoing left arm numbness and tingling between his muscles.   He states he is going home this weekend "no matter what."    Past Medical History:  Diagnosis Date   Alcohol use    Hypertension    Noncompliance    Smoking        Home Medications:  Prior to Admission medications   Medication Sig Start Date End Date Taking? Authorizing Provider  acetaminophen (TYLENOL) 500 MG tablet Take 1,000 mg by mouth every 6 (six) hours as needed for mild pain.   Yes [provider]  aspirin-acetaminophen-caffeine (EXCEDRIN MIGRAINE) [phone] MG tablet Take 2 tablets by mouth every 6 (six) hours as needed for headache.   Yes [provider]  ibuprofen (ADVIL) 200 MG tablet Take 600-800 mg by mouth every 6 (six) hours as needed for mild pain or headache.   Yes [provider]  naproxen sodium (ALEVE) 220 MG tablet Take 440 mg by mouth daily as needed (pain).   Yes [provider]  Phenyleph-Doxylamine-DM-APAP (NYQUIL SEVERE COLD/FLU) 5-6.25-10-325 MG/15ML LIQD Take 15 mLs by mouth every 6 (six) hours as needed (cold symptoms).   Yes [provider]    Inpatient Medications: Scheduled Meds:  amLODipine  5 mg Oral Daily   B-complex with vitamin C  1 tablet Oral Daily   carvedilol  6.25 mg Oral BID WC   Chlorhexidine Gluconate Cloth  6 each Topical Daily   hydrALAZINE  50 mg Oral Q8H   nicotine  14 mg Transdermal Daily   pantoprazole (PROTONIX) IV  40 mg Intravenous Q12H    sodium chloride flush  3 mL Intravenous Q12H   traZODone  50 mg Oral QHS   vitamin B-12  1,000 mcg Oral Daily   Continuous Infusions:  lactated ringers 50 mL/hr at 10/10/21 2313   nitroGLYCERIN Stopped (10/10/21 1901)   PRN Meds: acetaminophen **OR** acetaminophen, butalbital-acetaminophen-caffeine, HYDROmorphone (DILAUDID) injection, labetalol, oxyCODONE, senna-docusate  Allergies:    Allergies  Allergen Reactions   Fruit Extracts     In general all fruits and vegetable. Can eat a can of fruit.    Tomato (Diagnostic) Swelling   Wild Lettuce [Lactuca Virosa] Swelling    Social History:   Social History   Socioeconomic History   Marital status: Single    Spouse name: Not on file   Number of children: Not on file   Years of education: Not on file   Highest education level: Not on file  Occupational History   Not on file  Tobacco Use   Smoking status: Every Day    Packs/day: 0.30    Types: Cigarettes    Start date: 1998   Smokeless tobacco: Not on file  Substance and Sexual Activity   Alcohol use: Not Currently    Alcohol/week: 12.0 standard drinks    Types: 12 Cans of beer per week    Comment: stopped heavy drinking 2021   Drug use: Yes    Types: Marijuana   Sexual activity: Not on file  Other Topics Concern   Not on file  Social History Narrative   Not on file   Social Determinants of Health   Financial Resource Strain: Not on file  Food Insecurity: Not on file  Transportation Needs: Not on file  Physical Activity: Not on file  Stress: Not on file  Social Connections: Not on file  Intimate Partner Violence: Not on file    Family History:    Family History  Problem Relation Age of Onset   Hypertension Mother    Cervical cancer Mother    Breast cancer Sister    Sudden Cardiac Death Brother 11   Heart disease Maternal Grandmother    Cancer Maternal Grandmother      ROS:  Please see the history of present illness.   All other ROS reviewed and  negative.     Physical Exam/Data:   Vitals:   10/11/21 0639 10/11/21 0700 10/11/21 0900 10/11/21 1200  BP: (!) 164/99 (!) 171/112    Pulse:  75    Resp:  12    Temp:   97.7 F (36.5 C) 98.5 F (36.9 C)  TempSrc:   Axillary Oral  SpO2:  99%    Weight:      Height:        Intake/Output Summary (Last 24 hours) at 10/11/2021 1451 Last data filed at 10/11/2021 0900 Gross per  24 hour  Intake 221.54 ml  Output 1550 ml  Net -1328.46 ml   Last 3 Weights 10/10/2021  Weight (lbs) 170 lb  Weight (kg) 77.111 kg     Body mass index is 24.74 kg/m.  General:  Well nourished, well developed, in no acute distress HEENT: normal Neck: no JVD Vascular: No carotid bruits; Distal pulses 2+ bilaterally Cardiac:  normal S1, S2; RRR; no murmur  Lungs:  clear to auscultation bilaterally, no wheezing, rhonchi or rales  Abd: soft, nontender, no hepatomegaly  Ext: no edema Musculoskeletal:  No deformities, BUE and BLE strength normal and equal Skin: warm and dry  Neuro:  CNs 2-12 intact, no focal abnormalities noted Psych:  Normal affect   EKG:  The EKG was personally reviewed and demonstrates:  sinus rhythm with HR 64, ST depression with TWI inferior leads, STE anterior leads in the setting of LVH, TWI V4-6 Telemetry:  Telemetry was personally reviewed and demonstrates:   sinus rhythm with HR in the 80s  Relevant CV Studies:  Echo - final read pending  Laboratory Data:  High Sensitivity Troponin:   Recent Labs  Lab 10/10/21 1048 10/10/21 1535  TROPONINIHS 94* 85*     Chemistry Recent Labs  Lab 10/10/21 1758 10/11/21 0236 10/11/21 0935  NA 136 134* 135  K 3.0* 3.0* 3.2*  CL 101 103 102  CO2 26 23 25   GLUCOSE 131* 101* 108*  BUN 22* 21* 20  CREATININE 2.31* 2.01* 2.11*  CALCIUM 8.8* 8.5* 8.8*  MG 2.0 1.9  --   GFRNONAA 35* 41* 39*  ANIONGAP 9 8 8     Recent Labs  Lab 10/10/21 1758 10/11/21 0236 10/11/21 0935  PROT 7.9 6.8 7.4  ALBUMIN 3.9 3.3* 3.5  AST 25 21 23   ALT  13 10 11   ALKPHOS 98 81 91  BILITOT 1.1 0.9 1.0   Lipids No results for input(s): CHOL, TRIG, HDL, LABVLDL, LDLCALC, CHOLHDL in the last 168 hours.  Hematology Recent Labs  Lab 10/10/21 1048 10/10/21 1110 10/10/21 1758 10/11/21 0236 10/11/21 0935  WBC 9.9  --   --  8.9 9.1  RBC 3.74*  --  3.32* 2.97* 3.26*  HGB 10.8* 11.6*  --  8.6* 9.4*  HCT 32.4* 34.0*  --  26.2* 28.6*  MCV 86.6  --   --  88.2 87.7  MCH 28.9  --   --  29.0 28.8  MCHC 33.3  --   --  32.8 32.9  RDW 17.2*  --   --  17.2* 17.6*  PLT 123*  --   --  88* 97*   Thyroid  Recent Labs  Lab 10/10/21 1758  TSH 1.002    BNPNo results for input(s): BNP, PROBNP in the last 168 hours.  DDimer  Recent Labs  Lab 10/10/21 1758  DDIMER 1.11*     Radiology/Studies:  CT Head Wo Contrast  Result Date: 10/10/2021 CLINICAL DATA:  Neuro deficit, stroke suspected EXAM: CT HEAD WITHOUT CONTRAST TECHNIQUE: Contiguous axial images were obtained from the base of the skull through the vertex without intravenous contrast. RADIATION DOSE REDUCTION: This exam was performed according to the departmental dose-optimization program which includes automated exposure control, adjustment of the mA and/or kV according to patient size and/or use of iterative reconstruction technique. COMPARISON:  None. FINDINGS: Brain: There is no evidence of acute intracranial hemorrhage, extra-axial fluid collection, or acute infarct. Parenchymal volume is normal.  The ventricles are normal in size. There is no mass lesion.  There is no midline shift. Vascular: No hyperdense vessel or unexpected calcification. Skull: Normal. Negative for fracture or focal lesion. Sinuses/Orbits: The imaged paranasal sinuses are clear. The globes and orbits are unremarkable. Other: None. IMPRESSION: Normal head CT. Electronically Signed   By: Valetta Mole M.D.   On: 10/10/2021 12:19   CT Cervical Spine Wo Contrast  Result Date: 10/10/2021 CLINICAL DATA:  Cervical radiculopathy  EXAM: CT CERVICAL SPINE WITHOUT CONTRAST TECHNIQUE: Multidetector CT imaging of the cervical spine was performed without intravenous contrast. Multiplanar CT image reconstructions were also generated. RADIATION DOSE REDUCTION: This exam was performed according to the departmental dose-optimization program which includes automated exposure control, adjustment of the mA and/or kV according to patient size and/or use of iterative reconstruction technique. COMPARISON:  None. FINDINGS: Alignment: There is straightening of the normal cervical spine lordosis. There is no antero or retrolisthesis. Skull base and vertebrae: Vertebral body heights are preserved. Skull base alignment is normal. There is no evidence of acute fracture. There is no suspicious osseous lesion. Soft tissues and spinal canal: No prevertebral fluid or swelling. No visible canal hematoma. Disc levels: There is mild disc space narrowing with associated degenerative endplate change at W4-Y6. There is mild left worse than right facet arthropathy at this level resulting in mild bilateral neural foraminal stenosis without significant spinal canal stenosis. There is mild left neural foraminal stenosis at C6-C7. There is no significant spinal canal or neural foraminal stenosis at the remaining levels. Upper chest: There is mild scarring in the lung apices. Other: None. IMPRESSION: 1. Mild degenerative changes at C5-C6 and C6-C7 resulting in mild right worse than left neural foraminal stenosis at C5-C6 and mild left neural foraminal stenosis at C6-C7. No high-grade spinal canal or neural foraminal stenosis. 2. No acute findings. Electronically Signed   By: Valetta Mole M.D.   On: 10/10/2021 12:23   MR CERVICAL SPINE WO CONTRAST  Result Date: 10/11/2021 CLINICAL DATA:  Initial evaluation for neck pain extending into the left upper extremity. EXAM: MRI CERVICAL SPINE WITHOUT CONTRAST TECHNIQUE: Multiplanar, multisequence MR imaging of the cervical spine was  performed. No intravenous contrast was administered. COMPARISON:  Prior CT from earlier the same day. FINDINGS: Alignment: Examination mildly degraded by motion artifact. Straightening of the normal cervical lordosis. No significant listhesis. Vertebrae: Vertebral body height maintained without acute or chronic fracture. Bone marrow signal intensity diffusely decreased on T1 weighted sequence, nonspecific, but most commonly related to anemia, smoking or obesity. No discrete or worrisome osseous lesions. No abnormal marrow edema. Cord: Evaluation of the cervical spinal cord somewhat limited by motion artifact. No convincing cord signal changes seen. Note made of parrot hazy T2/STIR signal intensity seen about the cervicomedullary junction, favored to be artifactual. Normal cord caliber and morphology. Posterior Fossa, vertebral arteries, paraspinal tissues: Visualized brain and posterior fossa within normal limits. Craniocervical junction normal. Paraspinous and prevertebral soft tissues within normal limits. Normal flow voids seen within the vertebral arteries bilaterally. Disc levels: C2-C3: Minimal right-sided endplate spurring without significant disc bulge. No spinal stenosis. Foramina remain patent. C3-C4: Mild disc bulge with bilateral uncovertebral hypertrophy. Superimposed tiny central disc protrusion indents the ventral thecal sac (series 10, image 9). Mild spinal stenosis with minimal cord flattening but no cord signal changes. Mild to moderate left C4 foraminal stenosis. Right neural foramina remains patent. C4-C5: Mild disc bulge with uncovertebral spurring, asymmetric to the left. Bulging disc flattens and partially effaces the ventral thecal sac, worse on the left. Superimposed small central disc  protrusion contacts the ventral cord (series 10, image 14). Mild spinal stenosis with minimal cord flattening but no cord signal changes. Mild to moderate left C5 foraminal stenosis. Right neural foramen  remains patent. C5-C6: Mild disc bulge with bilateral uncovertebral hypertrophy. Broad post secondary flattening of the ventral thecal sac with resultant mild spinal stenosis. Moderate left worse than right C6 foraminal narrowing. C6-C7: Mild disc bulge with left worse than right uncovertebral spurring. No spinal stenosis. Moderate left C7 foraminal narrowing. Right neural foramen remains patent. C7-T1: Negative interspace. Left greater than right facet hypertrophy. No canal or foraminal stenosis. Visualized upper thoracic spine demonstrates no significant finding. IMPRESSION: 1. Multilevel cervical spondylosis with resultant mild spinal stenosis at C3-4 through C5-6. 2. Multifactorial degenerative changes with resultant mild to moderate left C4 through C7 foraminal stenosis as above. Findings could contribute to left upper extremity radicular symptoms. Electronically Signed   By: Jeannine Boga M.D.   On: 10/11/2021 00:30   US RENAL  Result Date: 10/10/2021 CLINICAL DATA:  Acute kidney injury EXAM: RENAL / URINARY TRACT ULTRASOUND COMPLETE COMPARISON:  None. FINDINGS: Right Kidney: Renal measurements: 9.4 x 4.8 x 4.4 cm = volume: 102.1 mL. Echogenicity within normal limits. No mass or hydronephrosis visualized. Left Kidney: Renal measurements: 9.1 x 4.7 x 4.8 cm = volume: 106.4 mL. Echogenicity within normal limits. No mass or hydronephrosis visualized. Bladder: Appears normal for degree of bladder distention. Other: None. IMPRESSION: Negative examination Electronically Signed   By: Donavan Foil M.D.   On: 10/10/2021 20:36   DG Chest Port 1 View  Result Date: 10/10/2021 CLINICAL DATA:  Shortness of breath over the last few days. EXAM: PORTABLE CHEST 1 VIEW COMPARISON:  None. FINDINGS: Artifact overlies the chest. Heart and mediastinal shadows are normal. The lungs are clear. The vascularity is normal. No significant bone finding. IMPRESSION: No active disease. Electronically Signed   By: Nelson Chimes  M.D.   On: 10/10/2021 11:38   VAS [place] LOWER EXTREMITY VENOUS (DVT)  Result Date: 10/11/2021  Lower Venous DVT Study Patient Name:  KWAN SHELLHAMMER  Date of Exam:   10/11/2021 Medical Rec #: [id number]           Accession #:    [id number] Date of Birth: 1976-03-01           Patient Gender: M Patient Age:   89 years Exam Location:  [facility] Procedure:      VAS [place] LOWER EXTREMITY VENOUS (DVT) Referring Phys: PRANAV PATEL --------------------------------------------------------------------------------  Indications: Elevated Ddimer.  Risk Factors: None identified. Comparison Study: No prior studies. Performing Technologist: Oliver Hum RVT  Examination Guidelines: A complete evaluation includes B-mode imaging, spectral Doppler, color Doppler, and power Doppler as needed of all accessible portions of each vessel. Bilateral testing is considered an integral part of a complete examination. Limited examinations for reoccurring indications may be performed as noted. The reflux portion of the exam is performed with the patient in reverse Trendelenburg.  +---------+---------------+---------+-----------+----------+--------------+  RIGHT     Compressibility Phasicity Spontaneity Properties Thrombus Aging  +---------+---------------+---------+-----------+----------+--------------+  CFV       Full            Yes       Yes                                    +---------+---------------+---------+-----------+----------+--------------+  SFJ       Full                                                             +---------+---------------+---------+-----------+----------+--------------+  FV Prox   Full                                                             +---------+---------------+---------+-----------+----------+--------------+  FV Mid    Full                                                             +---------+---------------+---------+-----------+----------+--------------+  FV Distal Full                                                              +---------+---------------+---------+-----------+----------+--------------+  PFV       Full                                                             +---------+---------------+---------+-----------+----------+--------------+  POP       Full            Yes       Yes                                    +---------+---------------+---------+-----------+----------+--------------+  PTV       Full                                                             +---------+---------------+---------+-----------+----------+--------------+  PERO      Full                                                             +---------+---------------+---------+-----------+----------+--------------+   +---------+---------------+---------+-----------+----------+--------------+  LEFT      Compressibility Phasicity Spontaneity Properties Thrombus Aging  +---------+---------------+---------+-----------+----------+--------------+  CFV       Full            Yes       Yes                                    +---------+---------------+---------+-----------+----------+--------------+  SFJ       Full                                                             +---------+---------------+---------+-----------+----------+--------------+  FV Prox   Full                                                             +---------+---------------+---------+-----------+----------+--------------+  FV Mid    Full                                                             +---------+---------------+---------+-----------+----------+--------------+  FV Distal Full                                                             +---------+---------------+---------+-----------+----------+--------------+  PFV       Full                                                             +---------+---------------+---------+-----------+----------+--------------+  POP       Full            Yes       Yes                                     +---------+---------------+---------+-----------+----------+--------------+  PTV       Full                                                             +---------+---------------+---------+-----------+----------+--------------+  PERO      Full                                                             +---------+---------------+---------+-----------+----------+--------------+     Summary: RIGHT: - There is no evidence of deep vein thrombosis in the lower extremity.  - No cystic structure found in the popliteal fossa.  LEFT: - There is no evidence of deep vein thrombosis in the lower extremity.  - No cystic structure found in the popliteal fossa.  *See table(s) above for measurements and observations.    Preliminary      Assessment and Plan:   Syncope Hypertensive emergency Hypertension Noncompliance - echo pending, LVH on EKG - telemetry does not show arrhythmia or heart block - started on 5 mg amlodipine, coreg 6.25 gm BID, and hydralazine 50 mg TID - pressure has improved to 161W systolic, still with high diastolic pressure  - consider increasing  amlodipine to 10 mg tomorrow  - will likely need increase in hydralazine, but question if he will be compliant on TID medication - recommend renal artery ultrasound to rule out RAS as a cause of secondary hypertension   Hypokalemia - K 2.9, improved now to 3.2 - continue to supplement - mg 1.9, consider    AKI - unclear what his baseline renal function is prior to hospitalization - suspect a component of CKD given uncontrolled hypertension - sCr 2.11 - recommend nephrology consult - will hold off on ACEI/ARB for now until we see what his baseline will be   Disposition - he has been in [place] since Sept 2021 and has not yet applied for medicaid - will need to be referred to [facility] [employer] [facility] for PCP care and for pharmacy benefits - appreciate SW help with this - will need to discharge with generic medications    Risk  Assessment/Risk Scores:       For questions or updates, please contact [facility] Please consult [email or web address] for contact info under    Signed, Ledora Bottcher, [place]  10/11/2021 2:51 PM

## 2021-10-11 NOTE — Progress Notes (Signed)
Progress Note   Patient: Roy Randolph OFB:[id number] DOB: 1976-08-12 DOA: 10/10/2021     1 DOS: the patient was seen and examined on 10/11/2021   Brief hospital course: 46 year old male with past medical history of essential hypertension, active smoker, history of alcohol abuse-sober since 1 year, from [place].  Presents with complaints of syncopal event, subacute left-sided arm numbness and epigastric pain as well as migraine.  Found to have abnormal EKG and hypertensive emergency. 1/12 cardiology consulted.  Echocardiogram completed.  Transfer out of the stepdown unit.  Assessment and Plan * Hypertensive emergency- (present on admission) Patient has prior history of hypertension, ran out of his medication in September 2022 as he moved from his state[place] to [place]. Systolic BP 782/423 on admission with severe headache, elevated troponin and epigastric pain as well as EKG changes. No blurred vision right now. No focal deficit at the time of my evaluation. Patient was started on IV nitroglycerin drip, discontinued on 1/11. Currently started on IV labetalol as needed, ordered Norvasc and hydralazine on a scheduled basis. UDS negative for cocaine. Appreciate cardiology assistance. Nephrology consulted as well for further assistance. Currently working up for secondary causes. Increase hydralazine from 50 3 times daily 200 3 times daily. Continue Norvasc. Added Coreg. Continue as needed labetalol.  Abnormal EKG- (present on admission) Elevated troponin. History of sudden cardiac death in family. Patient presents with complaints of epigastric pain with hypertensive emergency. EKG shows evidence of anterior ST-T wave changes concerning for anterior STEMI. ED provider discussed with Dr. Raynelle Fanning on call.  Dr. Claiborne Billings felt that the patient does not have any STEMI. Serial troponins trending down. Echocardiogram currently pending. [place] cardiology consultation.  Epigastric pain-  (present on admission) Likely from an NSAID induced gastritis. Continue scheduled PPI. Normal lipase level.  May require GI consultation for anemia.  Syncope, vasovagal- (present on admission) Patient presents with a syncopal event. Appears to be more vasovagal event. No focal deficit.  CT head unremarkable. Echo currently pending. Cardiology consult appreciated. Orthostatic vitals negative.  Hypokalemia- (present on admission) Potassium 3.2.  Continue to replace.  Recheck tomorrow. Recent Labs  Lab 10/11/21 0236 10/11/21 0935  K 3.0* 3.2*  MG 1.9  --      AKI (acute kidney injury) ([place]) vs CKD stage IIIb- (present on admission) No prior serum creatinine to compare. Currently serum creatinine stable around 2. BUN is normal which makes me think this is chronic kidney disease stage IIIb rather than acute kidney injury. Will continue with IV hydration. Normal ultrasound renal. Nephrology consulted. Fena 2.2.  Suggesting intrinsic damage.  Hypoosmolar.   H/O alcohol abuse- (present on admission) 1 year ago patient was reportedly drinking 6 to 12 cans of beer every day and couple of shots every day. He only drinks occasionally at his last drink was around Christmas now.  Migraine- (present on admission) Patient has history of migraine.  Progressively worsened since September 2022 once he ran out of his blood pressure medications. Does not take any medication to help with prophylaxis. Takes 2-3 Excedrin 2-3 times a day to help with his headache. At present will initiate with narcotics. Recommend long-term prophylaxis medication once his headache is better.  NSAID long-term use Significantly heavy use of NSAID's and has any worsening or time per patient. Patient likely have medication overuse headache. Anesthetics likely responsible for his gastritis as well as possible chronic blood loss anemia and acute kidney injury versus possible chronic kidney disease. Recommend he stop  using NSAID's  for now.   Thrombocytopenia ([place])- (present on admission) Likely in the setting of history of alcohol use. No evidence of schistocytes. Monitor for now.  Normocytic anemia- (present on admission) Hemoglobin significant low.  Reticulocyte count elevated. Will maintain the patient on clear liquid diet for now. Patient denies any acute bleeding history. Iron level normal, B12 level 374, relatively low. Monitor while the patient also has thrombocytopenia. Continue PPI twice daily. Low threshold to consult GI. Patient is Jehovah's Witness.  May require EPO.  Left arm numbness and pain- (present on admission) Patient reports numbness and pain involving his left upper extremity for last 3 to 4 weeks progressively worsening. CT C-spine shows evidence of mild degenerative changes without any high-grade stenosis. MRI C-spine negative for any cord compression but does show evidence of foraminal stenosis as well.  At present patient does not have any symptoms.  No further work-up for now outpatient follow-up with neurosurgery.  Vomiting- (present on admission) 1 episode in the ER after receiving pain medication. Monitor for now.  Smoker- (present on admission) Smokes 1 pack over 3 days. Nicotine patch. Currently willing to quit.  Marijuana use- (present on admission) Daily use. Monitor.     Subjective: No nausea no vomiting.  No fever no chills.  No further numbness of the left arm.  No chest pain.  No headache.  No diarrhea no constipation.  No bleeding reported.  Objective Vitals:   10/11/21 1600 10/11/21 1900 10/11/21 1922 10/11/21 2016  BP: (!) 208/138 (!) 173/105 (!) 179/101   Pulse: 86 88 92   Resp: 10 (!) 22 (!) 23   Temp: (!) 97.3 F (36.3 C)   98.4 F (36.9 C)  TempSrc: Oral   Oral  SpO2: 100% 95% 98%   Weight:      Height:        Vitals:   10/11/21 1600 10/11/21 1900 10/11/21 1922 10/11/21 2016  BP: (!) 208/138 (!) 173/105 (!) 179/101   Pulse: 86  88 92   Resp: 10 (!) 22 (!) 23   Temp: (!) 97.3 F (36.3 C)   98.4 F (36.9 C)  TempSrc: Oral   Oral  SpO2: 100% 95% 98%   Weight:      Height:        General: Appear in mild distress, no Rash; Oral Mucosa Clear, moist. no Abnormal Neck Mass Or lumps, Conjunctiva normal  Cardiovascular: S1 and S2 Present, no Murmur, Respiratory: good respiratory effort, Bilateral Air entry present and CTA, no Crackles, no wheezes Abdomen: Bowel Sound present, Soft and no tenderness Extremities: no Pedal edema Neurology: alert and oriented to time, place, and person affect appropriate. no new focal deficit Gait not checked due to patient safety concerns   Data Reviewed:  Renal function stable.  Worsening anemia and thrombocytopenia.  Family Communication: Brother at bedside.  Disposition: Status is: Inpatient  Remains inpatient appropriate because: Ongoing high blood pressure, further work-up for AKI and cardiac abnormalities.     Time spent: 55 minutes  Author: Berle Mull, MD 10/11/2021 8:53 PM  For on call review [email or web address].

## 2021-10-12 ENCOUNTER — Inpatient Hospital Stay (HOSPITAL_COMMUNITY): Payer: Medicaid Other

## 2021-10-12 DIAGNOSIS — I160 Hypertensive urgency: Secondary | ICD-10-CM

## 2021-10-12 DIAGNOSIS — E876 Hypokalemia: Secondary | ICD-10-CM

## 2021-10-12 LAB — MAGNESIUM: Magnesium: 2 mg/dL (ref 1.7–2.4)

## 2021-10-12 LAB — COMPREHENSIVE METABOLIC PANEL
ALT: 11 U/L (ref 0–44)
AST: 21 U/L (ref 15–41)
Albumin: 3.6 g/dL (ref 3.5–5.0)
Alkaline Phosphatase: 92 U/L (ref 38–126)
Anion gap: 8 (ref 5–15)
BUN: 17 mg/dL (ref 6–20)
CO2: 21 mmol/L — ABNORMAL LOW (ref 22–32)
Calcium: 8.5 mg/dL — ABNORMAL LOW (ref 8.9–10.3)
Chloride: 102 mmol/L (ref 98–111)
Creatinine, Ser: 1.81 mg/dL — ABNORMAL HIGH (ref 0.61–1.24)
GFR, Estimated: 47 mL/min — ABNORMAL LOW (ref >60)
Glucose, Bld: 96 mg/dL (ref 70–99)
Potassium: 3.4 mmol/L — ABNORMAL LOW (ref 3.5–5.1)
Sodium: 131 mmol/L — ABNORMAL LOW (ref 135–145)
Total Bilirubin: 1 mg/dL (ref 0.3–1.2)
Total Protein: 7.3 g/dL (ref 6.5–8.1)

## 2021-10-12 LAB — GLUCOSE, CAPILLARY: Glucose-Capillary: 93 mg/dL (ref 70–99)

## 2021-10-12 LAB — LACTATE DEHYDROGENASE: LDH: 446 U/L — ABNORMAL HIGH (ref 98–192)

## 2021-10-12 LAB — CBC WITH DIFFERENTIAL/PLATELET
Abs Immature Granulocytes: 0.07 10*3/uL (ref 0.00–0.07)
Basophils Absolute: 0 10*3/uL (ref 0.0–0.1)
Basophils Relative: 0 %
Eosinophils Absolute: 0.1 10*3/uL (ref 0.0–0.5)
Eosinophils Relative: 1 %
HCT: 28.8 % — ABNORMAL LOW (ref 39.0–52.0)
Hemoglobin: 9.4 g/dL — ABNORMAL LOW (ref 13.0–17.0)
Immature Granulocytes: 1 %
Lymphocytes Relative: 13 %
Lymphs Abs: 1 10*3/uL (ref 0.7–4.0)
MCH: 28.7 pg (ref 26.0–34.0)
MCHC: 32.6 g/dL (ref 30.0–36.0)
MCV: 88.1 fL (ref 80.0–100.0)
Monocytes Absolute: 1.1 10*3/uL — ABNORMAL HIGH (ref 0.1–1.0)
Monocytes Relative: 13 %
Neutro Abs: 5.6 10*3/uL (ref 1.7–7.7)
Neutrophils Relative %: 72 %
Platelets: 93 10*3/uL — ABNORMAL LOW (ref 150–400)
RBC: 3.27 MIL/uL — ABNORMAL LOW (ref 4.22–5.81)
RDW: 17.4 % — ABNORMAL HIGH (ref 11.5–15.5)
WBC: 7.9 10*3/uL (ref 4.0–10.5)
nRBC: 0 % (ref 0.0–0.2)

## 2021-10-12 LAB — HEPATITIS PANEL, ACUTE
HCV Ab: NONREACTIVE
Hep A IgM: NONREACTIVE
Hep B C IgM: NONREACTIVE
Hepatitis B Surface Ag: NONREACTIVE

## 2021-10-12 LAB — NO BLOOD PRODUCTS

## 2021-10-12 MED ORDER — POTASSIUM CHLORIDE CRYS ER 20 MEQ PO TBCR
40.0000 meq | EXTENDED_RELEASE_TABLET | Freq: Once | ORAL | Status: AC
  Administered 2021-10-12: 40 meq via ORAL
  Filled 2021-10-12: qty 2

## 2021-10-12 MED ORDER — AMLODIPINE BESYLATE 10 MG PO TABS
10.0000 mg | ORAL_TABLET | Freq: Every day | ORAL | Status: DC
  Administered 2021-10-12 – 2021-10-15 (×4): 10 mg via ORAL
  Filled 2021-10-12 (×4): qty 1

## 2021-10-12 MED ORDER — CARVEDILOL 12.5 MG PO TABS
12.5000 mg | ORAL_TABLET | Freq: Two times a day (BID) | ORAL | Status: DC
Start: 2021-10-12 — End: 2021-10-13
  Administered 2021-10-12 – 2021-10-13 (×2): 12.5 mg via ORAL
  Filled 2021-10-12 (×2): qty 1

## 2021-10-12 NOTE — Consult Note (Signed)
Renal Service Consult Note [facility]  Khris L Lacson 10/12/2021 Sol Blazing, MD Requesting Physician: Dr Posey Pronto, Mamie Nick.   Reason for Consult: Renal failure HPI: The patient is a 46 y.o. year-old w/ hx of etoh abuse, +tobacco, hx HTN presented to ED on 1/11 for near syncope which occurred at work. +chronic migraines and taking 2-3 excedrin 2-3 times per day for a long time. Moved to [facility] from [place] in Sept 2021 and stopped taking his BP medication then. In ED w/u showed severe HTN presenting BP 204/135. Creat was 2.31, no old creatinine. H/o heavy etoh abuse quit 1 year ago. Pt anemic w/ thrombocytopenia as well. Pt was admitted and started on coreg, hydralazine and norvasc.  BP's have improved slowly down to 170/ 100 range. No improvement in renal function. Asked to see for renal failure.   Pt seen in room. States he moved here in Sept 2021, not 2022 , and so has not had BP meds for over a year. Started BP meds in 2019.  Has lots of migraines, takes a lot of nsaids.  No voiding issues, no hematuria.    ROS - denies CP, no joint pain, no HA, no blurry vision, no rash, no diarrhea, no nausea/ vomiting, no dysuria, no difficulty voiding   Past Medical History  Past Medical History:  Diagnosis Date   Alcohol use    Hypertension    Noncompliance    Smoking    Past Surgical History  Family History  Family History  Problem Relation Age of Onset   Hypertension Mother    Cervical cancer Mother    Breast cancer Sister    Sudden Cardiac Death Brother 33   Heart disease Maternal Grandmother    Cancer Maternal Grandmother    Social History  reports that he has been smoking cigarettes. He started smoking about 25 years ago. He has been smoking an average of .3 packs per day. He does not have any smokeless tobacco history on file. He reports that he does not currently use alcohol after a past usage of about 12.0 standard drinks per week. He reports current drug use. Drug:  Marijuana. Allergies  Allergies  Allergen Reactions   Fruit Extracts     In general all fruits and vegetable. Can eat a can of fruit.    Tomato (Diagnostic) Swelling   Wild Lettuce [Lactuca Virosa] Swelling   Home medications Prior to Admission medications   Medication Sig Start Date End Date Taking? Authorizing Provider  acetaminophen (TYLENOL) 500 MG tablet Take 1,000 mg by mouth every 6 (six) hours as needed for mild pain.   Yes [provider]  aspirin-acetaminophen-caffeine (EXCEDRIN MIGRAINE) [phone] MG tablet Take 2 tablets by mouth every 6 (six) hours as needed for headache.   Yes [provider]  ibuprofen (ADVIL) 200 MG tablet Take 600-800 mg by mouth every 6 (six) hours as needed for mild pain or headache.   Yes [provider]  naproxen sodium (ALEVE) 220 MG tablet Take 440 mg by mouth daily as needed (pain).   Yes [provider]  Phenyleph-Doxylamine-DM-APAP (NYQUIL SEVERE COLD/FLU) 5-6.25-10-325 MG/15ML LIQD Take 15 mLs by mouth every 6 (six) hours as needed (cold symptoms).   Yes [provider]     Vitals:   10/11/21 2016 10/11/21 2148 10/11/21 2344 10/12/21 0417  BP:  (!) 197/129    Pulse:      Resp:      Temp: 98.4 F (36.9 C)  98.3  F (36.8 C) 97.7 F (36.5 C)  TempSrc: Oral  Oral Oral  SpO2:      Weight:      Height:       Exam Gen alert, no distress No rash, cyanosis or gangrene Sclera anicteric, throat clear  No jvd or bruits Chest clear bilat to bases, no rales/ wheezing RRR no MRG Abd soft ntnd no mass or ascites +bs GU normal MS no joint effusions or deformity Ext no LE or UE edema, no wounds or ulcers Neuro is alert, Ox 3 , nf    Home meds include - excedrin 2 q 6hrs prn ha, advila 600-800 qid prn, naproxen 440 qd prn, nyquil severe cold/ flu, tylenol prn    UA 1/12 - prot 30, o/w negative    UNa 58,  UCr 41    Na 131  K 3.4  CO2 21  BUN 17  Creat 1.81  Mg 2.0  Ca 8.5  alb 3.6  LFT's ok    eGFR 47   wBC 7K  Hb 9.4  plt 93        Assessment/ Plan: Renal failure - in setting of severe uncontrolled HTN. Also has low plts, +schistocytes on smear. UA negative, renal US wnl. 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  Renal artery duplex is pending. Will send serologies off but glom disease less likely. Suspect renal failure is due to acute and/or chronic uncont HTN +/-  heavy use of nsaids. Any acute BP damage may improve over the next few weeks/ months. Agree w/ BP management. Will need renal f/u, will arrange. Will follow.   Anemia - Hb 9's Thrombocytopenia - plts 90 K, ordered haptoglobin/ LDH Presyncope - seen by cardiology, no arrhythmia or heart block Hypokalemia - renin/ aldo ordered Prior etoh abuse Tobacco use      Rob Raylen Ken  MD 10/12/2021, 5:32 AM  Recent Labs  Lab 10/11/21 0935 10/12/21 0243  WBC 9.1 7.9  HGB 9.4* 9.4*   Recent Labs  Lab 10/11/21 0935 10/12/21 0243  K 3.2* 3.4*  BUN 20 17  CREATININE 2.11* 1.81*  CALCIUM 8.8* 8.5*

## 2021-10-12 NOTE — Progress Notes (Signed)
Renal artery duplex has been completed. Preliminary results can be found in CV Proc through chart review.   10/12/21 10:12 AM Roy Randolph RVT

## 2021-10-12 NOTE — Progress Notes (Signed)
Progress Note   Patient: Roy Randolph ZOX:[id number] DOB: [birth year] DOA: 10/10/2021     2 DOS: the patient was seen and examined on 10/12/2021   Brief hospital course: 46 year old male with past medical history of essential hypertension, active smoker, history of alcohol abuse-sober since 1 year, from [place].  Presents with complaints of syncopal event, subacute left-sided arm numbness and epigastric pain as well as migraine.  Found to have abnormal EKG and hypertensive emergency. 1/12 cardiology consulted.  Echocardiogram completed.  Transfer out of the stepdown unit.  Assessment and Plan * Hypertensive emergency due to non compliance- (present on admission) Vision changes.  Patient has prior history of hypertension, ran out of his medication in September 2022 as he moved from his state[place] to [place]. Systolic BP 811/914 on admission with severe headache, elevated troponin and epigastric pain as well as EKG changes. No blurred vision right now. No focal deficit at the time of my evaluation. Patient was started on IV nitroglycerin drip, discontinued on 1/11. Currently started on IV labetalol as needed, ordered Norvasc and hydralazine on a scheduled basis. UDS negative for cocaine. Appreciate cardiology assistance. Nephrology consulted as well for further assistance. Currently working up for secondary causes. Increase hydralazine from 50 3 times daily to 100 3 times daily. Continue Norvasc. Added Coreg. Will benefit from adding Aldactone as recommended by cardiology likely tomorrow. Continue as needed labetalol.  Abnormal EKG- (present on admission) Elevated troponin. History of sudden cardiac death in family. Patient presents with complaints of epigastric pain with hypertensive emergency. EKG shows evidence of anterior ST-T wave changes concerning for anterior STEMI. ED provider discussed with Dr. Raynelle Fanning on call.  Dr. Claiborne Billings felt that the patient does not have any  STEMI. Serial troponins trending down. Echocardiogram shows preserved EF without any abnormality.  Shows grade 2 diastolic dysfunction Appreciate cardiology consultation.  Epigastric pain- (present on admission) Likely from an NSAID induced gastritis. Continue scheduled PPI. Normal lipase level.  Tolerating oral diet.  Syncope, vasovagal- (present on admission) Patient presents with a syncopal event. Appears to be more vasovagal event. No focal deficit.  CT head unremarkable. Echo shows no acute abnormality. Cardiology consult appreciated. Orthostatic vitals negative.  Hypokalemia- (present on admission) Continue to replace.  Recheck tomorrow. Recent Labs  Lab 10/12/21 0243  K 3.4*  MG 2.0     AKI (acute kidney injury) ([place]) vs CKD stage IIIb- (present on admission) No prior serum creatinine to compare. Currently serum creatinine stable around 2. BUN is normal which makes me think this is chronic kidney disease stage IIIb rather than acute kidney injury. Was treated with IV hydration. Normal ultrasound renal. Nephrology consulted.  Appreciate assistance.  Outpatient follow-up recommended.  Serological work-up sent. Fena 2.2.  Suggesting intrinsic damage.  Hypoosmolar.   H/O alcohol abuse- (present on admission) 1 year ago patient was reportedly drinking 6 to 12 cans of beer every day and couple of shots every day. He only drinks occasionally at his last drink was around Christmas now.  Migraine- (present on admission) Patient has history of migraine.  Progressively worsened since September 2022 once he ran out of his blood pressure medications. Does not take any medication to help with prophylaxis. Takes 2-3 Excedrin 2-3 times a day to help with his headache. At present will initiate with narcotics. Recommend long-term prophylaxis medication once his headache is better.  NSAID long-term use Significantly heavy use of NSAID's and has any worsening or time per  patient. Patient likely have medication  overuse headache. Anesthetics likely responsible for his gastritis as well as possible chronic blood loss anemia and acute kidney injury versus possible chronic kidney disease. Recommend he stop using NSAID's for now.   Thrombocytopenia ([place])- (present on admission) Likely in the setting of history of alcohol use. No evidence of schistocytes. Monitor for now.  Normocytic anemia- (present on admission) Hemoglobin significant low.  Reticulocyte count elevated. Will maintain the patient on clear liquid diet for now. Patient denies any acute bleeding history. Iron level normal, B12 level 374, relatively low. Monitor while the patient also has thrombocytopenia. Continue PPI twice daily. Low threshold to consult GI. Patient is Jehovah's Witness.  May require EPO. Will benefit from outpatient oncology referral.  Left arm numbness and pain- (present on admission) Patient reports numbness and pain involving his left upper extremity for last 3 to 4 weeks progressively worsening. CT C-spine shows evidence of mild degenerative changes without any high-grade stenosis. MRI C-spine negative for any cord compression but does show evidence of foraminal stenosis as well.  At present patient does not have any symptoms.  No further work-up for now outpatient follow-up with neurosurgery.  Vomiting- (present on admission) 1 episode in the ER after receiving pain medication. Monitor for now.  Smoker- (present on admission) Smokes 1 pack over 3 days. Nicotine patch. Currently willing to quit.  Marijuana use- (present on admission) Daily use. Monitor.     Subjective: No nausea vomiting no fever no chills.  Tolerating oral diet.  Objective Vitals:   10/12/21 1600 10/12/21 1610 10/12/21 1630 10/12/21 1700  BP:      Pulse: 87  93 88  Resp: 16  19 18   Temp:  98 F (36.7 C)    TempSrc:  Oral    SpO2: 100%  100% 98%  Weight:      Height:         General: Appear in mild distress, no Rash; Oral Mucosa Clear, moist. no Abnormal Neck Mass Or lumps, Conjunctiva normal  Cardiovascular: S1 and S2 Present, no Murmur, Respiratory: good respiratory effort, Bilateral Air entry present and CTA, no Crackles, no wheezes Abdomen: Bowel Sound present, Soft and no tenderness Extremities: no Pedal edema Neurology: alert and oriented to time, place, and person affect appropriate. no new focal deficit Gait not checked due to patient safety concerns    Data Reviewed:  Serum creatinine improving.  Potassium level improving but still low.  Hemoglobin and platelets stable.  Family Communication: None at bedside.  Disposition: Status is: Inpatient  Remains inpatient appropriate because: Blood pressure still elevated.  Further work-up initiated.         Time spent: 50 minutes  Author: Berle Mull, MD 10/12/2021 8:33 PM  For on call review [email or web address].

## 2021-10-12 NOTE — Progress Notes (Signed)
Nutrition Education Note  RD consulted for nutrition education regarding low sodium diet for patient admitted with hypertensive urgency.   RD provided "Low Sodium Nutrition Therapy" and "Sodium Content of Foods" handouts from the Academy of [employer] and [employer]etics. Reviewed patient's dietary recall. Provided examples on ways to decrease sodium intake in diet. Discouraged intake of processed foods and use of salt shaker. Encouraged fresh fruits and vegetables as well as whole grain sources of carbohydrates to maximize fiber intake.   RD discussed why it is important for patient to adhere to diet recommendations, and emphasized the role of fluids, foods to avoid, and importance of weighing self daily. Teach back method used.  Patient greatly enjoys cooking. He does often use salt while cooking or once the food is prepared. Patient is aware of the importance of decreasing sodium intake and is very motivated to make changes.  He shares that as a child he loved to eat fruit and salads, but that for the past few years he has found that he gets itchy and his throat tightens with all fruits and fresh vegetables.  Encouraged patient to talk with MD about this and request outpatient referral or recommendations for outpatient services for an allergy specialist.  Expect good compliance.  Body mass index is 23.49 kg/m. Pt meets criteria for normal weight based on current BMI.  Current diet order is 2 gram Na.Labs and medications reviewed. No further nutrition interventions warranted at this time. RD contact information provided. If additional nutrition issues arise, please re-consult RD.      Roy Matin, MS, RD, LDN Inpatient Clinical Dietitian RD pager # available in [place]  After hours/weekend pager # available in [facility]

## 2021-10-12 NOTE — Progress Notes (Signed)
Progress Note  Patient Name: Roy Randolph Date of Encounter: 10/12/2021  [facility] Cardiologist: Pixie Casino, MD   Subjective   Still complains of a terrible headache, this 1 has been going on for a while.  He says these are usually brought on by high blood pressure.  Says he will be compliant with his medications if he can afford them.  No chest pain or shortness of breath.  Inpatient Medications    Scheduled Meds:  amLODipine  10 mg Oral Daily   B-complex with vitamin C  1 tablet Oral Daily   carvedilol  6.25 mg Oral BID WC   Chlorhexidine Gluconate Cloth  6 each Topical Daily   hydrALAZINE  100 mg Oral Q8H   nicotine  14 mg Transdermal Daily   pantoprazole (PROTONIX) IV  40 mg Intravenous Q12H   sodium chloride flush  3 mL Intravenous Q12H   traZODone  50 mg Oral QHS   vitamin B-12  1,000 mcg Oral Daily   Continuous Infusions:  nitroGLYCERIN Stopped (10/10/21 1901)   PRN Meds: acetaminophen **OR** acetaminophen, butalbital-acetaminophen-caffeine, HYDROmorphone (DILAUDID) injection, labetalol, oxyCODONE, senna-docusate   Vital Signs    Vitals:   10/12/21 0700 10/12/21 0715 10/12/21 0730 10/12/21 0745  BP: (!) 154/97 (!) 141/103 (!) 177/109 (!) 167/94  Pulse: 84 84 83 85  Resp: (!) 33 15 (!) 29 18  Temp:      TempSrc:      SpO2: 98% 100% 100% 99%  Weight:      Height:        Intake/Output Summary (Last 24 hours) at 10/12/2021 3536 Last data filed at 10/12/2021 0417 Gross per 24 hour  Intake --  Output 3900 ml  Net -3900 ml   Last 3 Weights 10/12/2021 10/10/2021  Weight (lbs) 161 lb 6 oz 170 lb  Weight (kg) 73.2 kg 77.111 kg      Telemetry    Sinus rhythm- Personally Reviewed  ECG    None today- Personally Reviewed  Physical Exam   GEN: No acute distress, but is sensitive to light Neck: No JVD Cardiac: RRR, no murmurs, rubs, or gallops.  Respiratory: Clear to auscultation bilaterally. GI: Soft, nontender, non-distended  MS: No  edema; No deformity. Neuro:  Nonfocal  Psych: Normal affect   Labs    High Sensitivity Troponin:   Recent Labs  Lab 10/10/21 1048 10/10/21 1535  TROPONINIHS 94* 85*     Chemistry Recent Labs  Lab 10/10/21 1758 10/11/21 0236 10/11/21 0935 10/12/21 0243  NA 136 134* 135 131*  K 3.0* 3.0* 3.2* 3.4*  CL 101 103 102 102  CO2 26 23 25  21*  GLUCOSE 131* 101* 108* 96  BUN 22* 21* 20 17  CREATININE 2.31* 2.01* 2.11* 1.81*  CALCIUM 8.8* 8.5* 8.8* 8.5*  MG 2.0 1.9  --  2.0  PROT 7.9 6.8 7.4 7.3  ALBUMIN 3.9 3.3* 3.5 3.6  AST 25 21 23 21   ALT 13 10 11 11   ALKPHOS 98 81 91 92  BILITOT 1.1 0.9 1.0 1.0  GFRNONAA 35* 41* 39* 47*  ANIONGAP 9 8 8 8     Lipids No results for input(s): CHOL, TRIG, HDL, LABVLDL, LDLCALC, CHOLHDL in the last 168 hours.  Hematology Recent Labs  Lab 10/11/21 0236 10/11/21 0935 10/12/21 0243  WBC 8.9 9.1 7.9  RBC 2.97* 3.26* 3.27*  HGB 8.6* 9.4* 9.4*  HCT 26.2* 28.6* 28.8*  MCV 88.2 87.7 88.1  MCH 29.0 28.8 28.7  MCHC  32.8 32.9 32.6  RDW 17.2* 17.6* 17.4*  PLT 88* 97* 93*   Thyroid  Recent Labs  Lab 10/10/21 1758  TSH 1.002    BNPNo results for input(s): BNP, PROBNP in the last 168 hours.  DDimer  Recent Labs  Lab 10/10/21 1758  DDIMER 1.11*     Radiology    CT Head Wo Contrast  Result Date: 10/10/2021 CLINICAL DATA:  Neuro deficit, stroke suspected EXAM: CT HEAD WITHOUT CONTRAST TECHNIQUE: Contiguous axial images were obtained from the base of the skull through the vertex without intravenous contrast. RADIATION DOSE REDUCTION: This exam was performed according to the departmental dose-optimization program which includes automated exposure control, adjustment of the mA and/or kV according to patient size and/or use of iterative reconstruction technique. COMPARISON:  None. FINDINGS: Brain: There is no evidence of acute intracranial hemorrhage, extra-axial fluid collection, or acute infarct. Parenchymal volume is normal.  The ventricles  are normal in size. There is no mass lesion.  There is no midline shift. Vascular: No hyperdense vessel or unexpected calcification. Skull: Normal. Negative for fracture or focal lesion. Sinuses/Orbits: The imaged paranasal sinuses are clear. The globes and orbits are unremarkable. Other: None. IMPRESSION: Normal head CT. Electronically Signed   By: Valetta Mole M.D.   On: 10/10/2021 12:19   CT Cervical Spine Wo Contrast  Result Date: 10/10/2021 CLINICAL DATA:  Cervical radiculopathy EXAM: CT CERVICAL SPINE WITHOUT CONTRAST TECHNIQUE: Multidetector CT imaging of the cervical spine was performed without intravenous contrast. Multiplanar CT image reconstructions were also generated. RADIATION DOSE REDUCTION: This exam was performed according to the departmental dose-optimization program which includes automated exposure control, adjustment of the mA and/or kV according to patient size and/or use of iterative reconstruction technique. COMPARISON:  None. FINDINGS: Alignment: There is straightening of the normal cervical spine lordosis. There is no antero or retrolisthesis. Skull base and vertebrae: Vertebral body heights are preserved. Skull base alignment is normal. There is no evidence of acute fracture. There is no suspicious osseous lesion. Soft tissues and spinal canal: No prevertebral fluid or swelling. No visible canal hematoma. Disc levels: There is mild disc space narrowing with associated degenerative endplate change at Q6-V7. There is mild left worse than right facet arthropathy at this level resulting in mild bilateral neural foraminal stenosis without significant spinal canal stenosis. There is mild left neural foraminal stenosis at C6-C7. There is no significant spinal canal or neural foraminal stenosis at the remaining levels. Upper chest: There is mild scarring in the lung apices. Other: None. IMPRESSION: 1. Mild degenerative changes at C5-C6 and C6-C7 resulting in mild right worse than left neural  foraminal stenosis at C5-C6 and mild left neural foraminal stenosis at C6-C7. No high-grade spinal canal or neural foraminal stenosis. 2. No acute findings. Electronically Signed   By: Valetta Mole M.D.   On: 10/10/2021 12:23   MR CERVICAL SPINE WO CONTRAST  Result Date: 10/11/2021 CLINICAL DATA:  Initial evaluation for neck pain extending into the left upper extremity. EXAM: MRI CERVICAL SPINE WITHOUT CONTRAST TECHNIQUE: Multiplanar, multisequence MR imaging of the cervical spine was performed. No intravenous contrast was administered. COMPARISON:  Prior CT from earlier the same day. FINDINGS: Alignment: Examination mildly degraded by motion artifact. Straightening of the normal cervical lordosis. No significant listhesis. Vertebrae: Vertebral body height maintained without acute or chronic fracture. Bone marrow signal intensity diffusely decreased on T1 weighted sequence, nonspecific, but most commonly related to anemia, smoking or obesity. No discrete or worrisome osseous lesions. No abnormal  marrow edema. Cord: Evaluation of the cervical spinal cord somewhat limited by motion artifact. No convincing cord signal changes seen. Note made of parrot hazy T2/STIR signal intensity seen about the cervicomedullary junction, favored to be artifactual. Normal cord caliber and morphology. Posterior Fossa, vertebral arteries, paraspinal tissues: Visualized brain and posterior fossa within normal limits. Craniocervical junction normal. Paraspinous and prevertebral soft tissues within normal limits. Normal flow voids seen within the vertebral arteries bilaterally. Disc levels: C2-C3: Minimal right-sided endplate spurring without significant disc bulge. No spinal stenosis. Foramina remain patent. C3-C4: Mild disc bulge with bilateral uncovertebral hypertrophy. Superimposed tiny central disc protrusion indents the ventral thecal sac (series 10, image 9). Mild spinal stenosis with minimal cord flattening but no cord signal  changes. Mild to moderate left C4 foraminal stenosis. Right neural foramina remains patent. C4-C5: Mild disc bulge with uncovertebral spurring, asymmetric to the left. Bulging disc flattens and partially effaces the ventral thecal sac, worse on the left. Superimposed small central disc protrusion contacts the ventral cord (series 10, image 14). Mild spinal stenosis with minimal cord flattening but no cord signal changes. Mild to moderate left C5 foraminal stenosis. Right neural foramen remains patent. C5-C6: Mild disc bulge with bilateral uncovertebral hypertrophy. Broad post secondary flattening of the ventral thecal sac with resultant mild spinal stenosis. Moderate left worse than right C6 foraminal narrowing. C6-C7: Mild disc bulge with left worse than right uncovertebral spurring. No spinal stenosis. Moderate left C7 foraminal narrowing. Right neural foramen remains patent. C7-T1: Negative interspace. Left greater than right facet hypertrophy. No canal or foraminal stenosis. Visualized upper thoracic spine demonstrates no significant finding. IMPRESSION: 1. Multilevel cervical spondylosis with resultant mild spinal stenosis at C3-4 through C5-6. 2. Multifactorial degenerative changes with resultant mild to moderate left C4 through C7 foraminal stenosis as above. Findings could contribute to left upper extremity radicular symptoms. Electronically Signed   By: Jeannine Boga M.D.   On: 10/11/2021 00:30   US RENAL  Result Date: 10/10/2021 CLINICAL DATA:  Acute kidney injury EXAM: RENAL / URINARY TRACT ULTRASOUND COMPLETE COMPARISON:  None. FINDINGS: Right Kidney: Renal measurements: 9.4 x 4.8 x 4.4 cm = volume: 102.1 mL. Echogenicity within normal limits. No mass or hydronephrosis visualized. Left Kidney: Renal measurements: 9.1 x 4.7 x 4.8 cm = volume: 106.4 mL. Echogenicity within normal limits. No mass or hydronephrosis visualized. Bladder: Appears normal for degree of bladder distention. Other: None.  IMPRESSION: Negative examination Electronically Signed   By: Donavan Foil M.D.   On: 10/10/2021 20:36   DG Chest Port 1 View  Result Date: 10/10/2021 CLINICAL DATA:  Shortness of breath over the last few days. EXAM: PORTABLE CHEST 1 VIEW COMPARISON:  None. FINDINGS: Artifact overlies the chest. Heart and mediastinal shadows are normal. The lungs are clear. The vascularity is normal. No significant bone finding. IMPRESSION: No active disease. Electronically Signed   By: Nelson Chimes M.D.   On: 10/10/2021 11:38   ECHOCARDIOGRAM COMPLETE  Result Date: 10/11/2021    ECHOCARDIOGRAM REPORT   Patient Name:   Roy Randolph Date of Exam: 10/11/2021 Medical Rec #:  [id number]          Height:       69.5 in Accession #:    [id number]         Weight:       170.0 lb Date of Birth:  Dec 20, 1975          BSA:          1.938 m  Patient Age:    86 years           BP:           171/112 mmHg Patient Gender: M                  HR:           82 bpm. Exam Location:  Inpatient Procedure: 2D Echo, Cardiac Doppler and Color Doppler Indications:    SYNCOPE  History:        Patient has no prior history of Echocardiogram examinations.                 Abnormal ECG, Signs/Symptoms:Syncope; Risk Factors:Hypertension                 and Current Smoker. ELEV, TROPONIN / ETOH Graylon Good.  Sonographer:    Beryle Beams Referring Phys: [id number] [place]  1. Left ventricular ejection fraction, by estimation, is 55%. The left ventricle has normal function. The left ventricle has no regional wall motion abnormalities. There is mild left ventricular hypertrophy. Left ventricular diastolic parameters are consistent with Grade II diastolic dysfunction (pseudonormalization).  2. Right ventricular systolic function is normal. The right ventricular size is normal. Tricuspid regurgitation signal is inadequate for assessing PA pressure.  3. Left atrial size was moderately dilated.  4. The mitral valve is normal in structure. Trivial  mitral valve regurgitation. No evidence of mitral stenosis.  5. The aortic valve is tricuspid. Aortic valve regurgitation is trivial. No aortic stenosis is present.  6. The inferior vena cava is normal in size with greater than 50% respiratory variability, suggesting right atrial pressure of 3 mmHg. FINDINGS  Left Ventricle: Left ventricular ejection fraction, by estimation, is 55%. The left ventricle has normal function. The left ventricle has no regional wall motion abnormalities. The left ventricular internal cavity size was normal in size. There is mild left ventricular hypertrophy. Left ventricular diastolic parameters are consistent with Grade II diastolic dysfunction (pseudonormalization). Right Ventricle: The right ventricular size is normal. No increase in right ventricular wall thickness. Right ventricular systolic function is normal. Tricuspid regurgitation signal is inadequate for assessing PA pressure. Left Atrium: Left atrial size was moderately dilated. Right Atrium: Right atrial size was normal in size. Pericardium: There is no evidence of pericardial effusion. Mitral Valve: The mitral valve is normal in structure. Trivial mitral valve regurgitation. No evidence of mitral valve stenosis. MV peak gradient, 45.4 mmHg. The mean mitral valve gradient is 18.0 mmHg. Tricuspid Valve: The tricuspid valve is normal in structure. Tricuspid valve regurgitation is not demonstrated. Aortic Valve: The aortic valve is tricuspid. Aortic valve regurgitation is trivial. Aortic regurgitation PHT measures 1460 msec. No aortic stenosis is present. Aortic valve mean gradient measures 4.0 mmHg. Aortic valve peak gradient measures 7.7 mmHg. Aortic valve area, by VTI measures 3.25 cm. Pulmonic Valve: The pulmonic valve was normal in structure. Pulmonic valve regurgitation is not visualized. Aorta: The aortic root is normal in size and structure. Venous: The inferior vena cava is normal in size with greater than 50%  respiratory variability, suggesting right atrial pressure of 3 mmHg. IAS/Shunts: No atrial level shunt detected by color flow Doppler.  LEFT VENTRICLE PLAX 2D LVIDd:         5.00 cm      Diastology LVIDs:         3.50 cm      LV e' medial:    8.55 cm/s LV PW:  1.30 cm      LV E/e' medial:  10.6 LV IVS:        1.30 cm      LV e' lateral:   9.95 cm/s LVOT diam:     2.20 cm      LV E/e' lateral: 9.1 LV SV:         92 LV SV Index:   47 LVOT Area:     3.80 cm  LV Volumes (MOD) LV vol d, MOD A2C: 94.7 ml LV vol d, MOD A4C: 112.0 ml LV vol s, MOD A2C: 37.6 ml LV vol s, MOD A4C: 45.8 ml LV SV MOD A2C:     57.1 ml LV SV MOD A4C:     112.0 ml LV SV MOD BP:      57.9 ml RIGHT VENTRICLE RV S prime:     16.30 cm/s TAPSE (M-mode): 2.4 cm LEFT ATRIUM             Index        RIGHT ATRIUM           Index LA diam:        4.10 cm 2.12 cm/m   RA Area:     13.00 cm LA Vol (A2C):   77.6 ml 40.05 ml/m  RA Volume:   26.70 ml  13.78 ml/m LA Vol (A4C):   81.9 ml 42.27 ml/m LA Biplane Vol: 80.0 ml 41.29 ml/m  AORTIC VALVE                    PULMONIC VALVE AV Area (Vmax):    2.84 cm     PV Vmax:       0.88 m/s AV Area (Vmean):   2.77 cm     PV Peak grad:  3.1 mmHg AV Area (VTI):     3.25 cm AV Vmax:           139.00 cm/s AV Vmean:          94.900 cm/s AV VTI:            0.283 m AV Peak Grad:      7.7 mmHg AV Mean Grad:      4.0 mmHg LVOT Vmax:         104.00 cm/s LVOT Vmean:        69.200 cm/s LVOT VTI:          0.242 m LVOT/AV VTI ratio: 0.86 AI PHT:            1460 msec  AORTA Ao Root diam: 3.30 cm Ao Asc diam:  3.20 cm MITRAL VALVE MV Area (PHT): 4.80 cm    SHUNTS MV Area VTI:   1.35 cm    Systemic VTI:  0.24 m MV Peak grad:  45.4 mmHg   Systemic Diam: 2.20 cm MV Mean grad:  18.0 mmHg MV Vmax:       3.37 m/s MV Vmean:      177.0 cm/s MV Decel Time: 158 msec MV E velocity: 90.70 cm/s MV A velocity: 72.80 cm/s MV E/A ratio:  1.25 Dalton McleanMD Electronically signed by Franki Monte Signature Date/Time:  10/11/2021/4:29:31 PM    Final    VAS [place] LOWER EXTREMITY VENOUS (DVT)  Result Date: 10/11/2021  Lower Venous DVT Study Patient Name:  Roy Randolph  Date of Exam:   10/11/2021 Medical Rec #: [id number]           Accession #:    [id number]  Date of Birth: Mar 11, 1976           Patient Gender: M Patient Age:   53 years Exam Location:  [facility] Procedure:      VAS [place] LOWER EXTREMITY VENOUS (DVT) Referring Phys: PRANAV PATEL --------------------------------------------------------------------------------  Indications: Elevated Ddimer.  Risk Factors: None identified. Comparison Study: No prior studies. Performing Technologist: Oliver Hum RVT  Examination Guidelines: A complete evaluation includes B-mode imaging, spectral Doppler, color Doppler, and power Doppler as needed of all accessible portions of each vessel. Bilateral testing is considered an integral part of a complete examination. Limited examinations for reoccurring indications may be performed as noted. The reflux portion of the exam is performed with the patient in reverse Trendelenburg.  +---------+---------------+---------+-----------+----------+--------------+  RIGHT     Compressibility Phasicity Spontaneity Properties Thrombus Aging  +---------+---------------+---------+-----------+----------+--------------+  CFV       Full            Yes       Yes                                    +---------+---------------+---------+-----------+----------+--------------+  SFJ       Full                                                             +---------+---------------+---------+-----------+----------+--------------+  FV Prox   Full                                                             +---------+---------------+---------+-----------+----------+--------------+  FV Mid    Full                                                             +---------+---------------+---------+-----------+----------+--------------+  FV Distal Full                                                              +---------+---------------+---------+-----------+----------+--------------+  PFV       Full                                                             +---------+---------------+---------+-----------+----------+--------------+  POP       Full            Yes       Yes                                    +---------+---------------+---------+-----------+----------+--------------+  PTV       Full                                                             +---------+---------------+---------+-----------+----------+--------------+  PERO      Full                                                             +---------+---------------+---------+-----------+----------+--------------+   +---------+---------------+---------+-----------+----------+--------------+  LEFT      Compressibility Phasicity Spontaneity Properties Thrombus Aging  +---------+---------------+---------+-----------+----------+--------------+  CFV       Full            Yes       Yes                                    +---------+---------------+---------+-----------+----------+--------------+  SFJ       Full                                                             +---------+---------------+---------+-----------+----------+--------------+  FV Prox   Full                                                             +---------+---------------+---------+-----------+----------+--------------+  FV Mid    Full                                                             +---------+---------------+---------+-----------+----------+--------------+  FV Distal Full                                                             +---------+---------------+---------+-----------+----------+--------------+  PFV       Full                                                             +---------+---------------+---------+-----------+----------+--------------+  POP       Full            Yes       Yes                                     +---------+---------------+---------+-----------+----------+--------------+  PTV       Full                                                             +---------+---------------+---------+-----------+----------+--------------+  PERO      Full                                                             +---------+---------------+---------+-----------+----------+--------------+     Summary: RIGHT: - There is no evidence of deep vein thrombosis in the lower extremity.  - No cystic structure found in the popliteal fossa.  LEFT: - There is no evidence of deep vein thrombosis in the lower extremity.  - No cystic structure found in the popliteal fossa.  *See table(s) above for measurements and observations. Electronically signed by Orlie Pollen on 10/11/2021 at 3:58:30 PM.    Final     Cardiac Studies   ECHO: 10/11/2021  1. Left ventricular ejection fraction, by estimation, is 55%. The left  ventricle has normal function. The left ventricle has no regional wall  motion abnormalities. There is mild left ventricular hypertrophy. Left  ventricular diastolic parameters are  consistent with Grade II diastolic dysfunction (pseudonormalization).   2. Right ventricular systolic function is normal. The right ventricular  size is normal. Tricuspid regurgitation signal is inadequate for assessing  PA pressure.   3. Left atrial size was moderately dilated.   4. The mitral valve is normal in structure. Trivial mitral valve  regurgitation. No evidence of mitral stenosis.   5. The aortic valve is tricuspid. Aortic valve regurgitation is trivial.  No aortic stenosis is present.   6. The inferior vena cava is normal in size with greater than 50%  respiratory variability, suggesting right atrial pressure of 3 mmHg.   LE DOPPLERS -negative for DVT  Renal artery ultrasound -pending but preliminary report indicates no critical stenosis  Renal ultrasound, negative  Patient Profile     46 y.o. male  w/ hx of etoh  abuse, +tobacco, hx HTN - noncompliant w/ meds, +chronic migraines taking 2-3 excedrin 2-3 times per day for a long time, was admitted 1/11 with severe hypertension, syncopal event, cards asked to see.  Assessment & Plan    1.  Syncope: -No cause by telemetry, or labs. - Happened in the setting of a severe migraine headache and significantly uncontrolled blood pressure, peak 203/139 -Continue to follow on telemetry, MD advise on him wearing a monitor at discharge  2.  Hypertension - He had been off meds for a couple of years - Has been started on amlodipine and uptitrated to 10 mg daily, carvedilol 6.25 mg twice daily, hydralazine 100 mg every 8 - Blood pressures improved but still not well controlled - The aldosterone and renin test has not been done, so will not add spironolactone -We will increase carvedilol to 12.5 mg twice daily starting this p.m.  3.  Hypokalemia: - K+ initially 2.9, now 3.4, will supplement - Mg 1.9 and 2.0  4.  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 - He has not applied  for Medicaid - He does not have a PCP - TOC consult    For questions or updates, please contact [facility] Please consult [email or web address] for contact info under        Signed, Rosaria Ferries, PA-C  10/12/2021, 8:22 AM

## 2021-10-13 LAB — CBC WITH DIFFERENTIAL/PLATELET
Abs Immature Granulocytes: 0.06 10*3/uL (ref 0.00–0.07)
Basophils Absolute: 0 10*3/uL (ref 0.0–0.1)
Basophils Relative: 0 %
Eosinophils Absolute: 0.2 10*3/uL (ref 0.0–0.5)
Eosinophils Relative: 2 %
HCT: 28.8 % — ABNORMAL LOW (ref 39.0–52.0)
Hemoglobin: 9.1 g/dL — ABNORMAL LOW (ref 13.0–17.0)
Immature Granulocytes: 1 %
Lymphocytes Relative: 22 %
Lymphs Abs: 1.6 10*3/uL (ref 0.7–4.0)
MCH: 28.3 pg (ref 26.0–34.0)
MCHC: 31.6 g/dL (ref 30.0–36.0)
MCV: 89.4 fL (ref 80.0–100.0)
Monocytes Absolute: 1.1 10*3/uL — ABNORMAL HIGH (ref 0.1–1.0)
Monocytes Relative: 15 %
Neutro Abs: 4.6 10*3/uL (ref 1.7–7.7)
Neutrophils Relative %: 60 %
Platelets: 100 10*3/uL — ABNORMAL LOW (ref 150–400)
RBC: 3.22 MIL/uL — ABNORMAL LOW (ref 4.22–5.81)
RDW: 17.4 % — ABNORMAL HIGH (ref 11.5–15.5)
WBC: 7.6 10*3/uL (ref 4.0–10.5)
nRBC: 0 % (ref 0.0–0.2)

## 2021-10-13 LAB — COMPREHENSIVE METABOLIC PANEL
ALT: 10 U/L (ref 0–44)
AST: 20 U/L (ref 15–41)
Albumin: 3.4 g/dL — ABNORMAL LOW (ref 3.5–5.0)
Alkaline Phosphatase: 95 U/L (ref 38–126)
Anion gap: 8 (ref 5–15)
BUN: 27 mg/dL — ABNORMAL HIGH (ref 6–20)
CO2: 22 mmol/L (ref 22–32)
Calcium: 9 mg/dL (ref 8.9–10.3)
Chloride: 106 mmol/L (ref 98–111)
Creatinine, Ser: 2.11 mg/dL — ABNORMAL HIGH (ref 0.61–1.24)
GFR, Estimated: 39 mL/min — ABNORMAL LOW (ref >60)
Glucose, Bld: 96 mg/dL (ref 70–99)
Potassium: 3.6 mmol/L (ref 3.5–5.1)
Sodium: 136 mmol/L (ref 135–145)
Total Bilirubin: 0.9 mg/dL (ref 0.3–1.2)
Total Protein: 7.2 g/dL (ref 6.5–8.1)

## 2021-10-13 LAB — ANTI-DNA ANTIBODY, DOUBLE-STRANDED: ds DNA Ab: <1 IU/mL (ref 0–9)

## 2021-10-13 LAB — C3 COMPLEMENT: C3 Complement: 130 mg/dL (ref 82–167)

## 2021-10-13 LAB — ANTISTREPTOLYSIN O TITER: ASO: 159 IU/mL (ref 0.0–200.0)

## 2021-10-13 LAB — GLUCOSE, CAPILLARY: Glucose-Capillary: 110 mg/dL — ABNORMAL HIGH (ref 70–99)

## 2021-10-13 LAB — C4 COMPLEMENT: Complement C4, Body Fluid: 37 mg/dL (ref 12–38)

## 2021-10-13 LAB — HAPTOGLOBIN: Haptoglobin: <10 mg/dL — ABNORMAL LOW (ref 23–355)

## 2021-10-13 LAB — MAGNESIUM: Magnesium: 2.1 mg/dL (ref 1.7–2.4)

## 2021-10-13 MED ORDER — METHOCARBAMOL 500 MG PO TABS
500.0000 mg | ORAL_TABLET | Freq: Three times a day (TID) | ORAL | Status: DC
  Administered 2021-10-13 – 2021-10-15 (×6): 500 mg via ORAL
  Filled 2021-10-13 (×6): qty 1

## 2021-10-13 MED ORDER — CARVEDILOL 25 MG PO TABS
25.0000 mg | ORAL_TABLET | Freq: Two times a day (BID) | ORAL | Status: DC
  Administered 2021-10-13 – 2021-10-15 (×4): 25 mg via ORAL
  Filled 2021-10-13 (×4): qty 1

## 2021-10-13 MED ORDER — SUMATRIPTAN SUCCINATE 25 MG PO TABS
25.0000 mg | ORAL_TABLET | ORAL | Status: DC | PRN
  Administered 2021-10-13 – 2021-10-14 (×3): 25 mg via ORAL
  Filled 2021-10-13 (×5): qty 1

## 2021-10-13 MED ORDER — AMITRIPTYLINE HCL 25 MG PO TABS
25.0000 mg | ORAL_TABLET | Freq: Every day | ORAL | Status: DC
  Administered 2021-10-13 – 2021-10-14 (×2): 25 mg via ORAL
  Filled 2021-10-13 (×2): qty 1

## 2021-10-13 MED ORDER — BUSPIRONE HCL 5 MG PO TABS
5.0000 mg | ORAL_TABLET | Freq: Two times a day (BID) | ORAL | Status: DC
  Administered 2021-10-13 – 2021-10-15 (×5): 5 mg via ORAL
  Filled 2021-10-13 (×5): qty 1

## 2021-10-13 MED ORDER — SUMATRIPTAN SUCCINATE 25 MG PO TABS: 25.0000 mg | ORAL_TABLET | ORAL | Status: DC | PRN

## 2021-10-13 MED ORDER — SPIRONOLACTONE 25 MG PO TABS
25.0000 mg | ORAL_TABLET | Freq: Every day | ORAL | Status: DC
Start: 2021-10-13 — End: 2021-10-13

## 2021-10-13 MED ORDER — SPIRONOLACTONE 25 MG PO TABS
25.0000 mg | ORAL_TABLET | Freq: Every day | ORAL | Status: DC
  Administered 2021-10-13: 25 mg via ORAL
  Filled 2021-10-13: qty 1

## 2021-10-13 MED ORDER — PANTOPRAZOLE SODIUM 40 MG PO TBEC
40.0000 mg | DELAYED_RELEASE_TABLET | Freq: Two times a day (BID) | ORAL | Status: DC
  Administered 2021-10-13 – 2021-10-15 (×4): 40 mg via ORAL
  Filled 2021-10-13 (×4): qty 1

## 2021-10-13 NOTE — Progress Notes (Addendum)
Progress Note   Patient: Roy Randolph IEP:[id number] DOB: 11/14/1975 DOA: 10/10/2021     3 DOS: the patient was seen and examined on 10/13/2021   Brief hospital course: 46 year old male with past medical history of essential hypertension, active smoker, history of alcohol abuse-sober since 1 year, from [place].  Presents with complaints of syncopal event, subacute left-sided arm numbness and epigastric pain as well as migraine.  Found to have abnormal EKG and hypertensive emergency. 1/12 cardiology consulted.  Echocardiogram completed.  Transfer out of the stepdown unit.  Assessment and Plan * Hypertensive emergency due to non compliance- (present on admission) Patient has prior history of hypertension, ran out of his medication in September 2022 as he moved from his state[place] to [place]. Systolic BP 660/630 on admission with severe headache, elevated troponin and epigastric pain as well as EKG changes. No blurred vision right now. No focal deficit at the time of my evaluation. Patient was started on IV nitroglycerin drip, discontinued on 1/11. Currently started on IV labetalol as needed, ordered Norvasc and hydralazine on a scheduled basis. UDS negative for cocaine. Appreciate cardiology assistance. Nephrology consulted as well for further assistance. Currently working up for secondary causes.  Continue Norvasc, continue Coreg as well as hydralazine dose modified. Add Aldactone. Continue as needed labetalol.  Abnormal EKG- (present on admission) Elevated troponin. History of sudden cardiac death in family. Patient presents with complaints of epigastric pain with hypertensive emergency. EKG shows evidence of anterior ST-T wave changes concerning for anterior STEMI. ED provider discussed with Dr. Raynelle Fanning on call.  Dr. Claiborne Billings felt that the patient does not have any STEMI. Serial troponins, not consistent with ACS, also trending down. Echocardiogram shows preserved EF without  any abnormality.  Shows grade 2 diastolic dysfunction Appreciate cardiology consultation.  Epigastric pain- (present on admission) Likely from an NSAID induced gastritis. Continue scheduled PPI. Normal lipase level.  Tolerating oral diet.  Syncope, vasovagal- (present on admission) Patient presents with a syncopal event. Appears to be more vasovagal event. No focal deficit.  CT head unremarkable. Echo shows no acute abnormality. Cardiology consult appreciated. Orthostatic vitals negative.  Hypokalemia- (present on admission) Potassium level stable now. Monitor.   AKI (acute kidney injury) ([place]) vs CKD stage IIIb- (present on admission) No prior serum creatinine to compare. Currently serum creatinine stable around 2. BUN is normal which makes me think this is chronic kidney disease stage IIIb rather than acute kidney injury. Was treated with IV hydration. Normal ultrasound renal. Nephrology consulted.  Appreciate assistance.  Outpatient follow-up recommended.  Serological work-up sent. Fena 2.2.  Suggesting intrinsic damage.  Hypoosmolar.   H/O alcohol abuse- (present on admission) 1 year ago patient was reportedly drinking 6 to 12 cans of beer every day and couple of shots every day. He only drinks occasionally at his last drink was around Christmas now.  Migraine- (present on admission) Patient has history of migraine.  Progressively worsened since September 2022 once he ran out of his blood pressure medications. Does not take any medication to help with prophylaxis. Takes 2-3 Excedrin 2-3 times a day to help with his headache. Imitrex for acute relief Fioricet as needed Opioids for severe pain Amitriptyline for prophylaxis.  Coreg and Norvasc would benefit as well.  NSAID long-term use Significantly heavy use of NSAID's and has any worsening or time per patient. Patient likely have medication overuse headache. NSAIDS likely responsible for his gastritis as well as  possible chronic blood loss anemia and acute kidney injury  versus possible chronic kidney disease. Recommend he stop using NSAID's for now.   Thrombocytopenia ([place])- (present on admission) Likely in the setting of history of alcohol use. No evidence of schistocytes. Monitor for now.  Normocytic anemia- (present on admission) Possibility of microangiopathic hemolytic anemia  hemoglobin significant low.  Reticulocyte count elevated. Patient denies any acute bleeding history. Iron level normal, B12 level 374, relatively low.  Haptoglobin low.  LDH elevated. Hemoglobin and platelet count both improving now. While the patient potentially may have mild hemolytic anemia, at present treatment is blood pressure control per nephrology. Continue PPI twice daily. Patient is Jehov[employer]'s [employer].  May require EPO. Will benefit from outpatient oncology referral.  Left arm numbness and pain- (present on admission) Patient reports numbness and pain involving his left upper extremity for last 3 to 4 weeks progressively worsening. CT C-spine shows evidence of mild degenerative changes without any high-grade stenosis. MRI C-spine negative for any cord compression but does show evidence of foraminal stenosis as well.   At present patient does not have any radicular symptoms.  Reports muscle spasm with his migraine episode.  Add Robaxin. No further work-up for now outpatient follow-up with neurosurgery.  Vomiting- (present on admission) 1 episode in the ER after receiving pain medication. Monitor for now.  Smoker- (present on admission) Smokes 1 pack over 3 days. Nicotine patch. Currently willing to quit.  Marijuana use- (present on admission) Daily use. Monitor.     Subjective: Reports headache episode started this morning.  No nausea no vomiting but no fever no chills.  No chest pain abdominal pain.  Reports left arm pain.  No numbness.  Objective  Vitals:   10/13/21 1000 10/13/21 1030  10/13/21 1100 10/13/21 1200  BP: (!) 161/75 (!) 149/52 (!) 147/39 (!) 158/51  Pulse: 84 77 77 82  Resp: 19 19 16  (!) 21  Temp:    98 F (36.7 C)  TempSrc:    Oral  SpO2: 98% 99% 99% 100%  Weight:      Height:        General: Appear in mild distress, no Rash; Oral Mucosa Clear, moist. no Abnormal Neck Mass Or lumps, Conjunctiva normal  Cardiovascular: S1 and S2 Present, no Murmur, Respiratory: good respiratory effort, Bilateral Air entry present and CTA, no Crackles, no wheezes Abdomen: Bowel Sound present, Soft and no tenderness Extremities: no Pedal edema Neurology: alert and oriented to time, place, and person affect appropriate. no new focal deficit Gait not checked due to patient safety concerns   Data Reviewed:  CMP stable.  CBC shows improvement in hemoglobin and platelet counts.  Reviewed notes from nephrology and cardiology.  Family Communication: None at bedside.  Disposition: Status is: Inpatient  Remains inpatient appropriate because: Ongoing high blood pressure and migraine requiring medication adjustment.         Time spent: 50 minutes  Author: Berle Mull, MD 10/13/2021 2:23 PM  For on call review [email or web address].

## 2021-10-13 NOTE — Progress Notes (Signed)
Progress Note  Patient Name: Roy Randolph Date of Encounter: 10/13/2021  Primary Cardiologist: Pixie Casino, MD   Subjective   Since last eval, renin and aldo have been collected  not resulted.  Notes persistent headache.  Notes he needs [place] medicaid and the transition from [facility] lead to medication issues.  Inpatient Medications    Scheduled Meds:  amLODipine  10 mg Oral Daily   B-complex with vitamin C  1 tablet Oral Daily   carvedilol  12.5 mg Oral BID WC   Chlorhexidine Gluconate Cloth  6 each Topical Daily   hydrALAZINE  100 mg Oral Q8H   nicotine  14 mg Transdermal Daily   pantoprazole (PROTONIX) IV  40 mg Intravenous Q12H   sodium chloride flush  3 mL Intravenous Q12H   traZODone  50 mg Oral QHS   vitamin B-12  1,000 mcg Oral Daily   Continuous Infusions:  nitroGLYCERIN Stopped (10/10/21 1901)   PRN Meds: acetaminophen **OR** acetaminophen, butalbital-acetaminophen-caffeine, labetalol, oxyCODONE, senna-docusate   Vital Signs    Vitals:   10/13/21 0300 10/13/21 0400 10/13/21 0500 10/13/21 0604  BP: (!) 161/104 (!) 167/102 (!) 160/91 (!) 151/106  Pulse: 86 88 91 97  Resp:  19  10  Temp: 98.5 F (36.9 C)     TempSrc: Oral     SpO2: 98% 99% 98% 100%  Weight:   74.6 kg   Height:        Intake/Output Summary (Last 24 hours) at 10/13/2021 0815 Last data filed at 10/13/2021 0600 Gross per 24 hour  Intake 1684.19 ml  Output 1850 ml  Net -165.81 ml   Filed Weights   10/10/21 1239 10/12/21 0500 10/13/21 0500  Weight: 77.1 kg 73.2 kg 74.6 kg    Telemetry    SR persistent TWI - Personally Reviewed  ECG    SR rate 64 LVH with secondary repolarization - Personally Reviewed  Physical Exam   Gen: no distress   Neck: No JVD, no carotid bruit Cardiac: No Rubs or Gallops, no Murmur, regula rhythm, +2 radial pulses Respiratory: Clear to auscultation bilaterally, normal effort, normal  respiratory rate GI: Soft, nontender, non-distended  MS: No  edema;   moves all extremities Integument: Skin feels warm Neuro:  At time of evaluation, alert and oriented to person/place/time/situation  Psych: Normal affect, patient feels [facility]   Labs    Chemistry Recent Labs  Lab 10/11/21 0935 10/12/21 0243 10/13/21 0233  NA 135 131* 136  K 3.2* 3.4* 3.6  CL 102 102 106  CO2 25 21* 22  GLUCOSE 108* 96 96  BUN 20 17 27*  CREATININE 2.11* 1.81* 2.11*  CALCIUM 8.8* 8.5* 9.0  PROT 7.4 7.3 7.2  ALBUMIN 3.5 3.6 3.4*  AST 23 21 20   ALT 11 11 10   ALKPHOS 91 92 95  BILITOT 1.0 1.0 0.9  GFRNONAA 39* 47* 39*  ANIONGAP 8 8 8      Hematology Recent Labs  Lab 10/11/21 0935 10/12/21 0243 10/13/21 0233  WBC 9.1 7.9 7.6  RBC 3.26* 3.27* 3.22*  HGB 9.4* 9.4* 9.1*  HCT 28.6* 28.8* 28.8*  MCV 87.7 88.1 89.4  MCH 28.8 28.7 28.3  MCHC 32.9 32.6 31.6  RDW 17.6* 17.4* 17.4*  PLT 97* 93* 100*    Cardiac EnzymesNo results for input(s): TROPONINI in the last 168 hours. No results for input(s): TROPIPOC in the last 168 hours.   BNPNo results for input(s): BNP, PROBNP in the last 168 hours.  DDimer  Recent Labs  Lab 10/10/21 1758  DDIMER 1.11*     Radiology    ECHOCARDIOGRAM COMPLETE  Result Date: 10/11/2021    ECHOCARDIOGRAM REPORT   Patient Name:   Roy Randolph Date of Exam: 10/11/2021 Medical Rec #:  [id number]          Height:       69.5 in Accession #:    [id number]         Weight:       170.0 lb Date of Birth:  1975-10-09          BSA:          1.938 m Patient Age:    46 years           BP:           171/112 mmHg Patient Gender: M                  HR:           82 bpm. Exam Location:  Inpatient Procedure: 2D Echo, Cardiac Doppler and Color Doppler Indications:    SYNCOPE  History:        Patient has no prior history of Echocardiogram examinations.                 Abnormal ECG, Signs/Symptoms:Syncope; Risk Factors:Hypertension                 and Current Smoker. ELEV, TROPONIN / ETOH Graylon Good.  Sonographer:    Beryle Beams Referring Phys:  [id number] [place]  1. Left ventricular ejection fraction, by estimation, is 55%. The left ventricle has normal function. The left ventricle has no regional wall motion abnormalities. There is mild left ventricular hypertrophy. Left ventricular diastolic parameters are consistent with Grade II diastolic dysfunction (pseudonormalization).  2. Right ventricular systolic function is normal. The right ventricular size is normal. Tricuspid regurgitation signal is inadequate for assessing PA pressure.  3. Left atrial size was moderately dilated.  4. The mitral valve is normal in structure. Trivial mitral valve regurgitation. No evidence of mitral stenosis.  5. The aortic valve is tricuspid. Aortic valve regurgitation is trivial. No aortic stenosis is present.  6. The inferior vena cava is normal in size with greater than 50% respiratory variability, suggesting right atrial pressure of 3 mmHg. FINDINGS  Left Ventricle: Left ventricular ejection fraction, by estimation, is 55%. The left ventricle has normal function. The left ventricle has no regional wall motion abnormalities. The left ventricular internal cavity size was normal in size. There is mild left ventricular hypertrophy. Left ventricular diastolic parameters are consistent with Grade II diastolic dysfunction (pseudonormalization). Right Ventricle: The right ventricular size is normal. No increase in right ventricular wall thickness. Right ventricular systolic function is normal. Tricuspid regurgitation signal is inadequate for assessing PA pressure. Left Atrium: Left atrial size was moderately dilated. Right Atrium: Right atrial size was normal in size. Pericardium: There is no evidence of pericardial effusion. Mitral Valve: The mitral valve is normal in structure. Trivial mitral valve regurgitation. No evidence of mitral valve stenosis. MV peak gradient, 45.4 mmHg. The mean mitral valve gradient is 18.0 mmHg. Tricuspid Valve: The tricuspid valve  is normal in structure. Tricuspid valve regurgitation is not demonstrated. Aortic Valve: The aortic valve is tricuspid. Aortic valve regurgitation is trivial. Aortic regurgitation PHT measures 1460 msec. No aortic stenosis is present. Aortic valve mean gradient measures 4.0 mmHg. Aortic valve peak gradient measures 7.7 mmHg. Aortic  valve area, by VTI measures 3.25 cm. Pulmonic Valve: The pulmonic valve was normal in structure. Pulmonic valve regurgitation is not visualized. Aorta: The aortic root is normal in size and structure. Venous: The inferior vena cava is normal in size with greater than 50% respiratory variability, suggesting right atrial pressure of 3 mmHg. IAS/Shunts: No atrial level shunt detected by color flow Doppler.  LEFT VENTRICLE PLAX 2D LVIDd:         5.00 cm      Diastology LVIDs:         3.50 cm      LV e' medial:    8.55 cm/s LV PW:         1.30 cm      LV E/e' medial:  10.6 LV IVS:        1.30 cm      LV e' lateral:   9.95 cm/s LVOT diam:     2.20 cm      LV E/e' lateral: 9.1 LV SV:         92 LV SV Index:   47 LVOT Area:     3.80 cm  LV Volumes (MOD) LV vol d, MOD A2C: 94.7 ml LV vol d, MOD A4C: 112.0 ml LV vol s, MOD A2C: 37.6 ml LV vol s, MOD A4C: 45.8 ml LV SV MOD A2C:     57.1 ml LV SV MOD A4C:     112.0 ml LV SV MOD BP:      57.9 ml RIGHT VENTRICLE RV S prime:     16.30 cm/s TAPSE (M-mode): 2.4 cm LEFT ATRIUM             Index        RIGHT ATRIUM           Index LA diam:        4.10 cm 2.12 cm/m   RA Area:     13.00 cm LA Vol (A2C):   77.6 ml 40.05 ml/m  RA Volume:   26.70 ml  13.78 ml/m LA Vol (A4C):   81.9 ml 42.27 ml/m LA Biplane Vol: 80.0 ml 41.29 ml/m  AORTIC VALVE                    PULMONIC VALVE AV Area (Vmax):    2.84 cm     PV Vmax:       0.88 m/s AV Area (Vmean):   2.77 cm     PV Peak grad:  3.1 mmHg AV Area (VTI):     3.25 cm AV Vmax:           139.00 cm/s AV Vmean:          94.900 cm/s AV VTI:            0.283 m AV Peak Grad:      7.7 mmHg AV Mean Grad:      4.0  mmHg LVOT Vmax:         104.00 cm/s LVOT Vmean:        69.200 cm/s LVOT VTI:          0.242 m LVOT/AV VTI ratio: 0.86 AI PHT:            1460 msec  AORTA Ao Root diam: 3.30 cm Ao Asc diam:  3.20 cm MITRAL VALVE MV Area (PHT): 4.80 cm    SHUNTS MV Area VTI:   1.35 cm    Systemic VTI:  0.24 m MV Peak grad:  45.4 mmHg   Systemic Diam: 2.20 cm MV Mean grad:  18.0 mmHg MV Vmax:       3.37 m/s MV Vmean:      177.0 cm/s MV Decel Time: 158 msec MV E velocity: 90.70 cm/s MV A velocity: 72.80 cm/s MV E/A ratio:  1.25 Dalton McleanMD Electronically signed by Franki Monte Signature Date/Time: 10/11/2021/4:29:31 PM    Final    VAS [place] LOWER EXTREMITY VENOUS (DVT)  Result Date: 10/11/2021  Lower Venous DVT Study Patient Name:  Roy Randolph  Date of Exam:   10/11/2021 Medical Rec #: [id number]           Accession #:    [id number] Date of Birth: 12/01/1975           Patient Gender: M Patient Age:   60 years Exam Location:  [facility] Procedure:      VAS [place] LOWER EXTREMITY VENOUS (DVT) Referring Phys: PRANAV PATEL --------------------------------------------------------------------------------  Indications: Elevated Ddimer.  Risk Factors: None identified. Comparison Study: No prior studies. Performing Technologist: Oliver Hum RVT  Examination Guidelines: A complete evaluation includes B-mode imaging, spectral Doppler, color Doppler, and power Doppler as needed of all accessible portions of each vessel. Bilateral testing is considered an integral part of a complete examination. Limited examinations for reoccurring indications may be performed as noted. The reflux portion of the exam is performed with the patient in reverse Trendelenburg.  +---------+---------------+---------+-----------+----------+--------------+  RIGHT     Compressibility Phasicity Spontaneity Properties Thrombus Aging  +---------+---------------+---------+-----------+----------+--------------+  CFV       Full            Yes       Yes                                     +---------+---------------+---------+-----------+----------+--------------+  SFJ       Full                                                             +---------+---------------+---------+-----------+----------+--------------+  FV Prox   Full                                                             +---------+---------------+---------+-----------+----------+--------------+  FV Mid    Full                                                             +---------+---------------+---------+-----------+----------+--------------+  FV Distal Full                                                             +---------+---------------+---------+-----------+----------+--------------+  PFV  Full                                                             +---------+---------------+---------+-----------+----------+--------------+  POP       Full            Yes       Yes                                    +---------+---------------+---------+-----------+----------+--------------+  PTV       Full                                                             +---------+---------------+---------+-----------+----------+--------------+  PERO      Full                                                             +---------+---------------+---------+-----------+----------+--------------+   +---------+---------------+---------+-----------+----------+--------------+  LEFT      Compressibility Phasicity Spontaneity Properties Thrombus Aging  +---------+---------------+---------+-----------+----------+--------------+  CFV       Full            Yes       Yes                                    +---------+---------------+---------+-----------+----------+--------------+  SFJ       Full                                                             +---------+---------------+---------+-----------+----------+--------------+  FV Prox   Full                                                              +---------+---------------+---------+-----------+----------+--------------+  FV Mid    Full                                                             +---------+---------------+---------+-----------+----------+--------------+  FV Distal Full                                                             +---------+---------------+---------+-----------+----------+--------------+  PFV       Full                                                             +---------+---------------+---------+-----------+----------+--------------+  POP       Full            Yes       Yes                                    +---------+---------------+---------+-----------+----------+--------------+  PTV       Full                                                             +---------+---------------+---------+-----------+----------+--------------+  PERO      Full                                                             +---------+---------------+---------+-----------+----------+--------------+     Summary: RIGHT: - There is no evidence of deep vein thrombosis in the lower extremity.  - No cystic structure found in the popliteal fossa.  LEFT: - There is no evidence of deep vein thrombosis in the lower extremity.  - No cystic structure found in the popliteal fossa.  *See table(s) above for measurements and observations. Electronically signed by Orlie Pollen on 10/11/2021 at 3:58:30 PM.    Final    VAS US RENAL ARTERY DUPLEX  Result Date: 10/12/2021 ABDOMINAL VISCERAL Patient Name:  Roy Randolph  Date of Exam:   10/12/2021 Medical Rec #: [id number]           Accession #:    [id number] Date of Birth: Feb 01, 1976           Patient Gender: M Patient Age:   33 years Exam Location:  [facility] Procedure:      VAS US RENAL ARTERY DUPLEX Referring Phys: [id number] [facility] M PATEL -------------------------------------------------------------------------------- Indications: Hypertension High Risk Factors: Hypertension. Comparison Study:  No prior studies. Performing Technologist: Oliver Hum RVT  Examination Guidelines: A complete evaluation includes B-mode imaging, spectral Doppler, color Doppler, and power Doppler as needed of all accessible portions of each vessel. Bilateral testing is considered an integral part of a complete examination. Limited examinations for reoccurring indications may be performed as noted.  Duplex Findings: +--------------------+--------+--------+------+--------+  Mesenteric           PSV cm/s EDV cm/s Plaque Comments  +--------------------+--------+--------+------+--------+  Aorta Mid              106       6                      +--------------------+--------+--------+------+--------+  Celiac Artery Origin   140                              +--------------------+--------+--------+------+--------+  SMA Proximal           150       34                     +--------------------+--------+--------+------+--------+    +------------------+--------+--------+-------+  Right Renal Artery PSV cm/s EDV cm/s Comment  +------------------+--------+--------+-------+  Origin                58       9              +------------------+--------+--------+-------+  Proximal             106       12             +------------------+--------+--------+-------+  Mid                   46       11             +------------------+--------+--------+-------+  Distal                47       10             +------------------+--------+--------+-------+ +-----------------+--------+--------+-------+  Left Renal Artery PSV cm/s EDV cm/s Comment  +-----------------+--------+--------+-------+  Origin              100       7              +-----------------+--------+--------+-------+  Proximal             90       16             +-----------------+--------+--------+-------+  Mid                  36       11             +-----------------+--------+--------+-------+  Distal               38       12             +-----------------+--------+--------+-------+  +------------+--------+--------+----+-----------+--------+--------+----+  Right Kidney PSV cm/s EDV cm/s RI   Left Kidney PSV cm/s EDV cm/s RI    +------------+--------+--------+----+-----------+--------+--------+----+  Upper Pole   22       7        0.70 Upper Pole  20       4        0.78  +------------+--------+--------+----+-----------+--------+--------+----+  Mid          15       5        0.66 Mid         19       5        0.75  +------------+--------+--------+----+-----------+--------+--------+----+  Lower Pole   24       6        0.75 Lower Pole  16       6        0.61  +------------+--------+--------+----+-----------+--------+--------+----+  Hilar        22       6        0.73 Hilar       30       9        0.68  +------------+--------+--------+----+-----------+--------+--------+----+ +------------------+-----+------------------+-----+  Right Kidney             Left Kidney               +------------------+-----+------------------+-----+  RAR                      RAR                       +------------------+-----+------------------+-----+  RAR (manual)       1     RAR (manual)       0.94   +------------------+-----+------------------+-----+  Cortex                   Cortex                    +------------------+-----+------------------+-----+  Cortex thickness         Corex thickness           +------------------+-----+------------------+-----+  Kidney length (cm) 10.00 Kidney length (cm) 10.00  +------------------+-----+------------------+-----+  Summary: Renal:  Right: Normal size right kidney. 1-59% stenosis of the right renal        artery. Normal right Resisitive Index. Left:  Normal size of left kidney. 1-59% stenosis of the left renal        artery. Normal left Resistive Index. Mesenteric: Normal Celiac artery and Superior Mesenteric artery findings.  *See table(s) above for measurements and observations.  Diagnosing physician: Jamelle Haring  Electronically signed by Jamelle Haring on 10/12/2021 at  2:47:56 PM.    Final       Patient Profile     46 y.o. male Hypertensive emergence  Assessment & Plan    Hypertensive emergency CKD and hypokalemia LVH Tobacco abuse - will increase coreg to 25 mg PO BID today - Norvasc 10 mg PO daily  - on hydralazine - if IM and Nephrology are in agreement, we will add aldactone 10/14/21 - nicotine cessation will help with BP , patient has planned to quit this admission  For questions or updates, please contact [place] Please consult [email or web address] for contact info under Cardiology/STEMI.      Signed, Werner Lean, MD  10/13/2021, 8:15 AM

## 2021-10-13 NOTE — Progress Notes (Signed)
[facility] Progress Note  Subjective: uOP 1.7 L yest, creat 2.11 today.  K 3.6 Hb 9.1.  p;ts up 100k   Vitals:   10/13/21 0300 10/13/21 0400 10/13/21 0500 10/13/21 0604  BP: (!) 161/104 (!) 167/102 (!) 160/91 (!) 151/106  Pulse: 86 88 91 97  Resp:  19  10  Temp: 98.5 F (36.9 C)     TempSrc: Oral     SpO2: 98% 99% 98% 100%  Weight:   74.6 kg   Height:        Exam: Gen alert, no distress No rash, cyanosis or gangrene Sclera anicteric, throat clear  No jvd or bruits Chest clear bilat to bases, no rales/ wheezing RRR no MRG Abd soft ntnd no mass or ascites +bs GU normal MS no joint effusions or deformity Ext no LE or UE edema, no wounds or ulcers Neuro is alert, Ox 3 , nf      Home meds include - excedrin 2 q 6hrs prn ha, advila 600-800 qid prn, naproxen 440 qd prn, nyquil severe cold/ flu, tylenol prn     UA 1/12 - prot 30, o/w negative    UNa 58,  UCr 41    Na 131  K 3.4  CO2 21  BUN 17  Creat 1.81  Mg 2.0  Ca 8.5  alb 3.6  LFT's ok   eGFR 47   wBC 7K  Hb 9.4  plt 93         Assessment/ Plan: Renal failure - in setting of severe uncontrolled HTN. Also has low plts, +schistocytes on smear. UA negative, renal US wnl. 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  Renal artery duplex is pending. Will send serologies off but glom disease less likely. Suspect renal failure is due to acute + chronic uncont HTN +/-  heavy use of nsaids. If there is acute renal damage it may improve over the next few weeks/ months. Agree w/ BP management. Creat stable, good UOP, medically ok for dc. Not sure socially though. Defer dc to pmd.  We will contact pt for [facility] f/u appt in 3-4 wks. Will sign off.  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 Plts improving w/ control of BP.  Presyncope - seen by cardiology, no arrhythmia or heart block Hypokalemia - renin/ aldo ordered Prior etoh abuse    Rob Raeford Brandenburg 10/13/2021, 7:10 AM   Recent Labs  Lab  10/12/21 0243 10/13/21 0233  K 3.4* 3.6  BUN 17 27*  CREATININE 1.81* 2.11*  CALCIUM 8.5* 9.0  HGB 9.4* 9.1*   Inpatient medications:  amLODipine  10 mg Oral Daily   B-complex with vitamin C  1 tablet Oral Daily   carvedilol  12.5 mg Oral BID WC   Chlorhexidine Gluconate Cloth  6 each Topical Daily   hydrALAZINE  100 mg Oral Q8H   nicotine  14 mg Transdermal Daily   pantoprazole (PROTONIX) IV  40 mg Intravenous Q12H   sodium chloride flush  3 mL Intravenous Q12H   traZODone  50 mg Oral QHS   vitamin B-12  1,000 mcg Oral Daily    nitroGLYCERIN Stopped (10/10/21 1901)   acetaminophen **OR** acetaminophen, butalbital-acetaminophen-caffeine, HYDROmorphone (DILAUDID) injection, labetalol, oxyCODONE, senna-docusate

## 2021-10-14 LAB — CBC WITH DIFFERENTIAL/PLATELET
Abs Immature Granulocytes: 0.07 10*3/uL (ref 0.00–0.07)
Basophils Absolute: 0 10*3/uL (ref 0.0–0.1)
Basophils Relative: 0 %
Eosinophils Absolute: 0.1 10*3/uL (ref 0.0–0.5)
Eosinophils Relative: 2 %
HCT: 29.1 % — ABNORMAL LOW (ref 39.0–52.0)
Hemoglobin: 9.7 g/dL — ABNORMAL LOW (ref 13.0–17.0)
Immature Granulocytes: 1 %
Lymphocytes Relative: 18 %
Lymphs Abs: 1.2 10*3/uL (ref 0.7–4.0)
MCH: 29.4 pg (ref 26.0–34.0)
MCHC: 33.3 g/dL (ref 30.0–36.0)
MCV: 88.2 fL (ref 80.0–100.0)
Monocytes Absolute: 1 10*3/uL (ref 0.1–1.0)
Monocytes Relative: 15 %
Neutro Abs: 4.2 10*3/uL (ref 1.7–7.7)
Neutrophils Relative %: 64 %
Platelets: 120 10*3/uL — ABNORMAL LOW (ref 150–400)
RBC: 3.3 MIL/uL — ABNORMAL LOW (ref 4.22–5.81)
RDW: 17.5 % — ABNORMAL HIGH (ref 11.5–15.5)
WBC: 6.6 10*3/uL (ref 4.0–10.5)
nRBC: 0 % (ref 0.0–0.2)

## 2021-10-14 LAB — RENAL FUNCTION PANEL
Albumin: 3.6 g/dL (ref 3.5–5.0)
Anion gap: 9 (ref 5–15)
BUN: 23 mg/dL — ABNORMAL HIGH (ref 6–20)
CO2: 23 mmol/L (ref 22–32)
Calcium: 9 mg/dL (ref 8.9–10.3)
Chloride: 103 mmol/L (ref 98–111)
Creatinine, Ser: 2.09 mg/dL — ABNORMAL HIGH (ref 0.61–1.24)
GFR, Estimated: 39 mL/min — ABNORMAL LOW (ref >60)
Glucose, Bld: 111 mg/dL — ABNORMAL HIGH (ref 70–99)
Phosphorus: 3 mg/dL (ref 2.5–4.6)
Potassium: 3.4 mmol/L — ABNORMAL LOW (ref 3.5–5.1)
Sodium: 135 mmol/L (ref 135–145)

## 2021-10-14 LAB — GLUCOSE, CAPILLARY: Glucose-Capillary: 109 mg/dL — ABNORMAL HIGH (ref 70–99)

## 2021-10-14 LAB — ANTINUCLEAR ANTIBODIES, IFA: ANA Ab, IFA: NEGATIVE

## 2021-10-14 MED ORDER — SPIRONOLACTONE 25 MG PO TABS
50.0000 mg | ORAL_TABLET | Freq: Every day | ORAL | Status: DC
  Administered 2021-10-14 – 2021-10-15 (×2): 50 mg via ORAL
  Filled 2021-10-14 (×2): qty 2

## 2021-10-14 MED ORDER — HEPARIN SODIUM (PORCINE) 5000 UNIT/ML IJ SOLN
5000.0000 [IU] | Freq: Three times a day (TID) | INTRAMUSCULAR | Status: DC
  Filled 2021-10-14 (×3): qty 1

## 2021-10-14 MED ORDER — ISOSORBIDE DINITRATE 10 MG PO TABS
10.0000 mg | ORAL_TABLET | Freq: Two times a day (BID) | ORAL | Status: DC
  Filled 2021-10-14: qty 1

## 2021-10-14 MED ORDER — CLONIDINE HCL 0.1 MG PO TABS
0.1000 mg | ORAL_TABLET | Freq: Every day | ORAL | Status: DC
  Administered 2021-10-14: 0.1 mg via ORAL
  Filled 2021-10-14: qty 1

## 2021-10-14 MED ORDER — CLONIDINE HCL 0.1 MG PO TABS
0.1000 mg | ORAL_TABLET | Freq: Two times a day (BID) | ORAL | Status: DC
  Administered 2021-10-14 – 2021-10-15 (×2): 0.1 mg via ORAL
  Filled 2021-10-14 (×2): qty 1

## 2021-10-14 NOTE — Plan of Care (Signed)
  Problem: Education: Goal: Knowledge of General Education information will improve Description: Including pain rating scale, medication(s)/side effects and non-pharmacologic comfort measures Outcome: Progressing   Problem: Clinical Measurements: Goal: Diagnostic test results will improve Outcome: Progressing   

## 2021-10-14 NOTE — Progress Notes (Signed)
Progress Note   Patient: Roy Randolph DOB: 1976-04-13 DOA: 10/10/2021     4 DOS: the patient was seen and examined on 10/14/2021   Brief hospital course: 46 year old male with past medical history of essential hypertension, active smoker, history of alcohol abuse-sober since 1 year, from [place].  Presents with complaints of syncopal event, subacute left-sided arm numbness and epigastric pain as well as migraine.  Found to have abnormal EKG and hypertensive emergency. 1/12 cardiology consulted.  Echocardiogram completed.  Transfer out of the stepdown unit.  Assessment and Plan * Hypertensive emergency due to non compliance- (present on admission) Patient has prior history of hypertension, ran out of his medication in September 2022 as he moved from his state[place] to [place]. Systolic BP 063/016 on admission with severe headache, elevated troponin and epigastric pain as well as EKG changes. No blurred vision right now. No focal deficit at the time of my evaluation. Patient was started on IV nitroglycerin drip, discontinued on 1/11. Currently started on IV labetalol as needed, ordered Norvasc and hydralazine on a scheduled basis. UDS negative for cocaine. Appreciate cardiology assistance. Nephrology consulted as well for further assistance. Currently working up for secondary causes.  Continue Norvasc, continue Coreg, hydralazine Aldactone. Added clonidine. Patient is concerned with medication causing headache and therefore Isordil discontinued. Continue as needed labetalol.  Abnormal EKG- (present on admission) Elevated troponin. History of sudden cardiac death in family. Patient presents with complaints of epigastric pain with hypertensive emergency. EKG shows evidence of anterior ST-T wave changes concerning for anterior STEMI. ED provider discussed with Dr. Raynelle Fanning on call.  Dr. Claiborne Billings felt that the patient does not have any STEMI. Serial troponins, not  consistent with ACS, also trending down. Echocardiogram shows preserved EF without any abnormality.  Shows grade 2 diastolic dysfunction Appreciate cardiology consultation.  Epigastric pain- (present on admission) Likely from an NSAID induced gastritis. Continue scheduled PPI. Normal lipase level.  Tolerating oral diet.  Syncope, vasovagal- (present on admission) Patient presents with a syncopal event. Appears to be more vasovagal event. No focal deficit.  CT head unremarkable. Echo shows no acute abnormality. Cardiology consult appreciated. Orthostatic vitals negative.  Hypokalemia- (present on admission) Potassium level stable now. Monitor.   AKI (acute kidney injury) ([place]) vs CKD stage IIIb- (present on admission) No prior serum creatinine to compare. Currently serum creatinine stable around 2. BUN is normal which makes me think this is chronic kidney disease stage IIIb rather than acute kidney injury. Was treated with IV hydration. Normal ultrasound renal. Nephrology consulted.  Appreciate assistance.  Outpatient follow-up recommended.  Serological work-up sent. Fena 2.2.  Suggesting intrinsic damage.  Hypoosmolar.  H/O alcohol abuse- (present on admission) 1 year ago patient was reportedly drinking 6 to 12 cans of beer every day and couple of shots every day. He only drinks occasionally at his last drink was around Christmas now.  Migraine- (present on admission) Patient has history of migraine.  Progressively worsened since September 2022 once he ran out of his blood pressure medications. Does not take any medication to help with prophylaxis. Takes 2-3 Excedrin 2-3 times a day to help with his headache. Imitrex for acute relief Fioricet as needed Opioids for severe pain Amitriptyline for prophylaxis.  Coreg and Norvasc would benefit as well.  NSAID long-term use Significantly heavy use of NSAID's and has any worsening or time per patient. Patient likely have  medication overuse headache. NSAIDS likely responsible for his gastritis as well as possible chronic blood  loss anemia and acute kidney injury versus possible chronic kidney disease. Recommend he stop using NSAID's for now.   Thrombocytopenia ([place])- (present on admission) Likely in the setting of history of alcohol use. No evidence of schistocytes. Monitor for now.  Normocytic anemia- (present on admission) Possibility of microangiopathic hemolytic anemia  hemoglobin significant low.  Reticulocyte count elevated. Patient denies any acute bleeding history. Iron level normal, B12 level 374, relatively low.  Haptoglobin low.  LDH elevated. Hemoglobin and platelet count both improving now. While the patient potentially may have mild hemolytic anemia, at present treatment is blood pressure control per nephrology. Continue PPI twice daily. Patient is Jehovah's Witness.  May require EPO. Will benefit from outpatient oncology referral.  Left arm numbness and pain- (present on admission) Patient reports numbness and pain involving his left upper extremity for last 3 to 4 weeks progressively worsening. CT C-spine shows evidence of mild degenerative changes without any high-grade stenosis. MRI C-spine negative for any cord compression but does show evidence of foraminal stenosis as well.   At present patient does not have any radicular symptoms.  Reports muscle spasm with his migraine episode.   Continue Robaxin. No further work-up for now outpatient follow-up with neurosurgery.  Vomiting- (present on admission) 1 episode in the ER after receiving pain medication. Monitor for now.  Smoker- (present on admission) Smokes 1 pack over 3 days. Nicotine patch. Currently willing to quit.  Marijuana use- (present on admission) Daily use. Monitor.     Subjective: Reports that yesterday's episode of headache resolved by the end of this day and starts having another episode of headache earlier  this morning.  He did not use Imitrex at that point.  No nausea no vomiting.  Reports some photo and phonophobia.  No diarrhea.  No focal deficit.  Reports that oxycodone is not helping his headache.  Objective Vitals:   10/14/21 0530 10/14/21 1102 10/14/21 1103 10/14/21 1336  BP: (!) 175/110 (!) 169/101  (!) 139/96  Pulse: 91  87 74  Resp: 18   17  Temp: 98.1 F (36.7 C)   98 F (36.7 C)  TempSrc: Oral   Oral  SpO2: 98%   99%  Weight:      Height:        General: Appear in mild distress, no Rash; Oral Mucosa Clear, moist. no Abnormal Neck Mass Or lumps, Conjunctiva normal  Cardiovascular: S1 and S2 Present, no Murmur, Respiratory: good respiratory effort, Bilateral Air entry present and CTA, no Crackles, no wheezes Abdomen: Bowel Sound present, Soft and no tenderness Extremities: no Pedal edema Neurology: alert and oriented to time, place, and person affect appropriate. no new focal deficit Gait not checked due to patient safety concerns   Data Reviewed:  BMP shows potassium level low.  Serum creatinine stable. CBC shows platelets improving.  Hemoglobin stable.  WBC stable.  Family Communication: None at bedside  Disposition: Status is: Inpatient  Remains inpatient appropriate because: Blood pressure remaining high, ongoing headache.  Medication adjustment.         Time spent: 35 minutes  Author: Berle Mull, MD 10/14/2021 2:08 PM  For on call review [email or web address].

## 2021-10-14 NOTE — Progress Notes (Signed)
Progress Note  Patient Name: Roy Randolph Date of Encounter: 10/14/2021  Primary Cardiologist: Pixie Casino, MD   Subjective   Renin Aldo pending Moved to floor. Patient notes worsening migraine- the blinds do not completely close.  Inpatient Medications    Scheduled Meds:  amitriptyline  25 mg Oral QHS   amLODipine  10 mg Oral Daily   B-complex with vitamin C  1 tablet Oral Daily   busPIRone  5 mg Oral BID   carvedilol  25 mg Oral BID WC   Chlorhexidine Gluconate Cloth  6 each Topical Daily   heparin injection (subcutaneous)  5,000 Units Subcutaneous Q8H   hydrALAZINE  100 mg Oral Q8H   methocarbamol  500 mg Oral TID   nicotine  14 mg Transdermal Daily   pantoprazole  40 mg Oral BID   sodium chloride flush  3 mL Intravenous Q12H   spironolactone  50 mg Oral Daily   vitamin B-12  1,000 mcg Oral Daily   Continuous Infusions:  nitroGLYCERIN Stopped (10/10/21 1901)   PRN Meds: acetaminophen **OR** acetaminophen, butalbital-acetaminophen-caffeine, labetalol, oxyCODONE, senna-docusate, SUMAtriptan   Vital Signs    Vitals:   10/13/21 2018 10/14/21 0033 10/14/21 0500 10/14/21 0530  BP: (!) 176/112 (!) 159/88  (!) 175/110  Pulse: 84 82  91  Resp: 18 18  18   Temp: 98.2 F (36.8 C) 98.3 F (36.8 C)  98.1 F (36.7 C)  TempSrc: Oral Oral  Oral  SpO2: 98% 97%  98%  Weight:   76.3 kg   Height:       No intake or output data in the 24 hours ending 10/14/21 0812  Filed Weights   10/12/21 0500 10/13/21 0500 10/14/21 0500  Weight: 73.2 kg 74.6 kg 76.3 kg    Telemetry    SR to sinus tachycardia- Personally Reviewed  ECG    SR rate 64 LVH with secondary repolarization - Personally Reviewed  Physical Exam   Gen: mild distress   Neck: No JVD, no carotid bruit Cardiac: No Rubs or Gallops, no Murmur, regula rhythm, +2 radial pulses Respiratory: Clear to auscultation bilaterally, normal effort, normal  respiratory rate GI: Soft, nontender, non-distended   MS: No  edema;  moves all extremities Integument: Skin feels warm Neuro:  At time of evaluation, alert and oriented to person/place/time/situation  Psych: Normal affect, patient feels [facility]   Labs    Chemistry Recent Labs  Lab 10/11/21 0935 10/12/21 0243 10/13/21 0233 10/14/21 0506  NA 135 131* 136 135  K 3.2* 3.4* 3.6 3.4*  CL 102 102 106 103  CO2 25 21* 22 23  GLUCOSE 108* 96 96 111*  BUN 20 17 27* 23*  CREATININE 2.11* 1.81* 2.11* 2.09*  CALCIUM 8.8* 8.5* 9.0 9.0  PROT 7.4 7.3 7.2  --   ALBUMIN 3.5 3.6 3.4* 3.6  AST 23 21 20   --   ALT 11 11 10   --   ALKPHOS 91 92 95  --   BILITOT 1.0 1.0 0.9  --   GFRNONAA 39* 47* 39* 39*  ANIONGAP 8 8 8 9      Hematology Recent Labs  Lab 10/12/21 0243 10/13/21 0233 10/14/21 0506  WBC 7.9 7.6 6.6  RBC 3.27* 3.22* 3.30*  HGB 9.4* 9.1* 9.7*  HCT 28.8* 28.8* 29.1*  MCV 88.1 89.4 88.2  MCH 28.7 28.3 29.4  MCHC 32.6 31.6 33.3  RDW 17.4* 17.4* 17.5*  PLT 93* 100* 120*    Cardiac EnzymesNo results for input(s):  TROPONINI in the last 168 hours. No results for input(s): TROPIPOC in the last 168 hours.   BNPNo results for input(s): BNP, PROBNP in the last 168 hours.   DDimer  Recent Labs  Lab 10/10/21 1758  DDIMER 1.11*     Radiology    VAS US RENAL ARTERY DUPLEX  Result Date: 10/12/2021 ABDOMINAL VISCERAL Patient Name:  Roy Randolph  Date of Exam:   10/12/2021 Medical Rec #: [id number]           Accession #:    [id number] Date of Birth: [birth year]           Patient Gender: M Patient Age:   26 years Exam Location:  [facility] Procedure:      VAS US RENAL ARTERY DUPLEX Referring Phys: [id number] [facility] M PATEL -------------------------------------------------------------------------------- Indications: Hypertension High Risk Factors: Hypertension. Comparison Study: No prior studies. Performing Technologist: Oliver Hum RVT  Examination Guidelines: A complete evaluation includes B-mode imaging, spectral Doppler,  color Doppler, and power Doppler as needed of all accessible portions of each vessel. Bilateral testing is considered an integral part of a complete examination. Limited examinations for reoccurring indications may be performed as noted.  Duplex Findings: +--------------------+--------+--------+------+--------+  Mesenteric           PSV cm/s EDV cm/s Plaque Comments  +--------------------+--------+--------+------+--------+  Aorta Mid              106       6                      +--------------------+--------+--------+------+--------+  Celiac Artery Origin   140                              +--------------------+--------+--------+------+--------+  SMA Proximal           150       34                     +--------------------+--------+--------+------+--------+    +------------------+--------+--------+-------+  Right Renal Artery PSV cm/s EDV cm/s Comment  +------------------+--------+--------+-------+  Origin                58       9              +------------------+--------+--------+-------+  Proximal             106       12             +------------------+--------+--------+-------+  Mid                   46       11             +------------------+--------+--------+-------+  Distal                47       10             +------------------+--------+--------+-------+ +-----------------+--------+--------+-------+  Left Renal Artery PSV cm/s EDV cm/s Comment  +-----------------+--------+--------+-------+  Origin              100       7              +-----------------+--------+--------+-------+  Proximal             90       16             +-----------------+--------+--------+-------+  Mid                  36       11             +-----------------+--------+--------+-------+  Distal               38       12             +-----------------+--------+--------+-------+ +------------+--------+--------+----+-----------+--------+--------+----+  Right Kidney PSV cm/s EDV cm/s RI   Left Kidney PSV cm/s EDV cm/s RI     +------------+--------+--------+----+-----------+--------+--------+----+  Upper Pole   22       7        0.70 Upper Pole  20       4        0.78  +------------+--------+--------+----+-----------+--------+--------+----+  Mid          15       5        0.66 Mid         19       5        0.75  +------------+--------+--------+----+-----------+--------+--------+----+  Lower Pole   24       6        0.75 Lower Pole  16       6        0.61  +------------+--------+--------+----+-----------+--------+--------+----+  Hilar        22       6        0.73 Hilar       30       9        0.68  +------------+--------+--------+----+-----------+--------+--------+----+ +------------------+-----+------------------+-----+  Right Kidney             Left Kidney               +------------------+-----+------------------+-----+  RAR                      RAR                       +------------------+-----+------------------+-----+  RAR (manual)       1     RAR (manual)       0.94   +------------------+-----+------------------+-----+  Cortex                   Cortex                    +------------------+-----+------------------+-----+  Cortex thickness         Corex thickness           +------------------+-----+------------------+-----+  Kidney length (cm) 10.00 Kidney length (cm) 10.00  +------------------+-----+------------------+-----+  Summary: Renal:  Right: Normal size right kidney. 1-59% stenosis of the right renal        artery. Normal right Resisitive Index. Left:  Normal size of left kidney. 1-59% stenosis of the left renal        artery. Normal left Resistive Index. Mesenteric: Normal Celiac artery and Superior Mesenteric artery findings.  *See table(s) above for measurements and observations.  Diagnosing physician: Jamelle Haring  Electronically signed by Jamelle Haring on 10/12/2021 at 2:47:56 PM.    Final       Patient Profile     46 y.o. male Hypertensive emergence  Assessment & Plan    Hypertensive emergency CKD and  hypokalemia LVH Tobacco abuse Migraine - On coreg to 25 mg PO BID  today - Norvasc 10 mg PO daily  - on hydralazine 100 mg TID - on aldactone 50 mg PO daily - nicotine cessation will help with BP , patient has planned to quit this admission  - will add isordil for today, if IM/Nephrology in agreement, given stable creatinine would like to start losartan 50 mg PO daily  For questions or updates, please contact [place] Please consult [email or web address] for contact info under Cardiology/STEMI.      Signed, Werner Lean, MD  10/14/2021, 8:12 AM

## 2021-10-15 LAB — GLUCOSE, CAPILLARY: Glucose-Capillary: 129 mg/dL — ABNORMAL HIGH (ref 70–99)

## 2021-10-15 LAB — CBC WITH DIFFERENTIAL/PLATELET
Abs Immature Granulocytes: 0.06 10*3/uL (ref 0.00–0.07)
Basophils Absolute: 0 10*3/uL (ref 0.0–0.1)
Basophils Relative: 1 %
Eosinophils Absolute: 0.2 10*3/uL (ref 0.0–0.5)
Eosinophils Relative: 3 %
HCT: 29.8 % — ABNORMAL LOW (ref 39.0–52.0)
Hemoglobin: 9.6 g/dL — ABNORMAL LOW (ref 13.0–17.0)
Immature Granulocytes: 1 %
Lymphocytes Relative: 24 %
Lymphs Abs: 1.5 10*3/uL (ref 0.7–4.0)
MCH: 28.5 pg (ref 26.0–34.0)
MCHC: 32.2 g/dL (ref 30.0–36.0)
MCV: 88.4 fL (ref 80.0–100.0)
Monocytes Absolute: 1.2 10*3/uL — ABNORMAL HIGH (ref 0.1–1.0)
Monocytes Relative: 18 %
Neutro Abs: 3.5 10*3/uL (ref 1.7–7.7)
Neutrophils Relative %: 53 %
Platelets: 122 10*3/uL — ABNORMAL LOW (ref 150–400)
RBC: 3.37 MIL/uL — ABNORMAL LOW (ref 4.22–5.81)
RDW: 17.1 % — ABNORMAL HIGH (ref 11.5–15.5)
WBC: 6.5 10*3/uL (ref 4.0–10.5)
nRBC: 0 % (ref 0.0–0.2)

## 2021-10-15 LAB — RENAL FUNCTION PANEL
Albumin: 3.7 g/dL (ref 3.5–5.0)
Anion gap: 9 (ref 5–15)
BUN: 25 mg/dL — ABNORMAL HIGH (ref 6–20)
CO2: 23 mmol/L (ref 22–32)
Calcium: 9 mg/dL (ref 8.9–10.3)
Chloride: 103 mmol/L (ref 98–111)
Creatinine, Ser: 2.4 mg/dL — ABNORMAL HIGH (ref 0.61–1.24)
GFR, Estimated: 33 mL/min — ABNORMAL LOW (ref >60)
Glucose, Bld: 111 mg/dL — ABNORMAL HIGH (ref 70–99)
Phosphorus: 2.8 mg/dL (ref 2.5–4.6)
Potassium: 3.5 mmol/L (ref 3.5–5.1)
Sodium: 135 mmol/L (ref 135–145)

## 2021-10-15 LAB — ANCA PROFILE
Anti-MPO Antibodies: <0.2 units (ref 0.0–0.9)
Anti-PR3 Antibodies: <0.2 units (ref 0.0–0.9)
Atypical P-ANCA titer: <1:20 {titer}
C-ANCA: <1:20 {titer}
P-ANCA: <1:20 {titer}

## 2021-10-15 LAB — KAPPA/LAMBDA LIGHT CHAINS
Kappa free light chain: 72.3 mg/L — ABNORMAL HIGH (ref 3.3–19.4)
Kappa, lambda light chain ratio: 1.23 (ref 0.26–1.65)
Lambda free light chains: 58.9 mg/L — ABNORMAL HIGH (ref 5.7–26.3)

## 2021-10-15 LAB — PROTEIN ELECTROPHORESIS, SERUM
A/G Ratio: 1.1 (ref 0.7–1.7)
Albumin ELP: 3.4 g/dL (ref 2.9–4.4)
Alpha-1-Globulin: 0.3 g/dL (ref 0.0–0.4)
Alpha-2-Globulin: 0.5 g/dL (ref 0.4–1.0)
Beta Globulin: 1.1 g/dL (ref 0.7–1.3)
Gamma Globulin: 1.4 g/dL (ref 0.4–1.8)
Globulin, Total: 3.2 g/dL (ref 2.2–3.9)
Total Protein ELP: 6.6 g/dL (ref 6.0–8.5)

## 2021-10-15 LAB — GLOMERULAR BASEMENT MEMBRANE ANTIBODIES: GBM Ab: <0.2 units (ref 0.0–0.9)

## 2021-10-15 MED ORDER — B COMPLEX-C PO TABS: 1.0000 | ORAL_TABLET | Freq: Every day | ORAL | 0 refills | Status: DC

## 2021-10-15 MED ORDER — CARVEDILOL 25 MG PO TABS: 25.0000 mg | ORAL_TABLET | Freq: Two times a day (BID) | ORAL | 0 refills | Status: DC

## 2021-10-15 MED ORDER — SPIRONOLACTONE 50 MG PO TABS: 50.0000 mg | ORAL_TABLET | Freq: Every day | ORAL | 0 refills | Status: DC

## 2021-10-15 MED ORDER — NICOTINE 14 MG/24HR TD PT24: 14.0000 mg | MEDICATED_PATCH | Freq: Every day | TRANSDERMAL | 0 refills | Status: DC

## 2021-10-15 MED ORDER — PANTOPRAZOLE SODIUM 40 MG PO TBEC: 40.0000 mg | DELAYED_RELEASE_TABLET | Freq: Every day | ORAL | 0 refills | Status: DC

## 2021-10-15 MED ORDER — CLONIDINE HCL 0.1 MG PO TABS: 0.1000 mg | ORAL_TABLET | Freq: Two times a day (BID) | ORAL | 11 refills | Status: DC

## 2021-10-15 MED ORDER — AMLODIPINE BESYLATE 10 MG PO TABS: 10.0000 mg | ORAL_TABLET | Freq: Every day | ORAL | 0 refills | Status: DC

## 2021-10-15 MED ORDER — HYDRALAZINE HCL 50 MG PO TABS: 100.0000 mg | ORAL_TABLET | Freq: Three times a day (TID) | ORAL | 0 refills | Status: DC

## 2021-10-15 MED ORDER — CYANOCOBALAMIN 1000 MCG PO TABS: 1000.0000 ug | ORAL_TABLET | Freq: Every day | ORAL | 0 refills | Status: DC

## 2021-10-15 MED ORDER — AMITRIPTYLINE HCL 25 MG PO TABS: 25.0000 mg | ORAL_TABLET | Freq: Every day | ORAL | 0 refills | Status: DC

## 2021-10-15 MED ORDER — BUTALBITAL-APAP-CAFFEINE 50-325-40 MG PO TABS: 1.0000 | ORAL_TABLET | ORAL | 0 refills | Status: DC | PRN

## 2021-10-15 MED ORDER — METHOCARBAMOL 500 MG PO TABS: 500.0000 mg | ORAL_TABLET | Freq: Three times a day (TID) | ORAL | 0 refills | Status: DC | PRN

## 2021-10-15 MED ORDER — SUMATRIPTAN SUCCINATE 25 MG PO TABS: 25.0000 mg | ORAL_TABLET | ORAL | 0 refills | Status: DC | PRN

## 2021-10-15 NOTE — TOC Transition Note (Addendum)
Transition of Care [facility]) - CM/SW Discharge Note   Patient Details  Name: Roy Randolph MRN: [id number] Date of Birth: July 11, 1976  [facility] CM/SW Contact:  Ross Ludwig, LCSW Phone Number: 10/15/2021, 1:35 PM   Clinical Narrative:     CSW received consult that patient needs medication assistance.  Due to patient having out of state Medicaid, only assistance CSW can provide is providing GoodRx coupons.  CSW arranged for a hospital follow up appointment for Monday February 13th at 2:30pm at [facility]  CSW asked patient to be added to the will call list in case an appointment opens up sooner.  Once patient is established at [facility], they can assist with helping him get cheaper medications.  Information added to patient's AVS.  CSW also provided contact information for [place] for him to contact to find out how to switch his Medicaid over to [facility].  CSW signing off, please reconsult with other social work needs arise.   Final next level of care: Home/Self Care Barriers to Discharge: Barriers Resolved, Financial Resources, Inadequate or no insurance   Patient Goals and CMS Choice Patient states their goals for this hospitalization and ongoing recovery are:: To return back home.      Discharge Placement  Patient going home.                     Discharge Plan and Services                                     Social Determinants of Health (SDOH) Interventions     Readmission Risk Interventions No flowsheet data found.

## 2021-10-15 NOTE — Progress Notes (Addendum)
Progress Note  Patient Name: Roy Randolph Date of Encounter: 10/15/2021  [facility] Cardiologist: Pixie Casino, MD   Subjective   No acute overnight events. Patient feeling much better this morning. He states this is first day that he has not had a headache. No chest pain or shortness of breath. BP much better this morning at 144/96 (and this is before any medications).  Inpatient Medications    Scheduled Meds:  amitriptyline  25 mg Oral QHS   amLODipine  10 mg Oral Daily   B-complex with vitamin C  1 tablet Oral Daily   busPIRone  5 mg Oral BID   carvedilol  25 mg Oral BID WC   Chlorhexidine Gluconate Cloth  6 each Topical Daily   cloNIDine  0.1 mg Oral BID   heparin injection (subcutaneous)  5,000 Units Subcutaneous Q8H   hydrALAZINE  100 mg Oral Q8H   methocarbamol  500 mg Oral TID   nicotine  14 mg Transdermal Daily   pantoprazole  40 mg Oral BID   sodium chloride flush  3 mL Intravenous Q12H   spironolactone  50 mg Oral Daily   vitamin B-12  1,000 mcg Oral Daily   Continuous Infusions:  PRN Meds: acetaminophen **OR** acetaminophen, butalbital-acetaminophen-caffeine, labetalol, oxyCODONE, senna-docusate, SUMAtriptan   Vital Signs    Vitals:   10/14/21 1749 10/14/21 2101 10/15/21 0500 10/15/21 0529  BP: 139/84 (!) 152/102  (!) 144/96  Pulse: 90 78  80  Resp:  18  18  Temp:  98 F (36.7 C)    TempSrc:  Oral  Oral  SpO2:    98%  Weight:   74.1 kg   Height:        Intake/Output Summary (Last 24 hours) at 10/15/2021 0734 Last data filed at 10/14/2021 1300 Gross per 24 hour  Intake 680 ml  Output --  Net 680 ml   Last 3 Weights 10/15/2021 10/14/2021 10/13/2021  Weight (lbs) 163 lb 5.8 oz 168 lb 3.4 oz 164 lb 7.4 oz  Weight (kg) 74.1 kg 76.3 kg 74.6 kg      Telemetry    Normal sinus rhythm with rates in the 70s to 80s. - Personally Reviewed  ECG    No new ECG tracing today. - Personally Reviewed  Physical Exam   GEN: 46 year  African-American male resting comfortably in no acute distress.   Neck: No JVD. Cardiac: RRR. No murmurs, rubs, or gallops.  Respiratory: Clear to auscultation bilaterally. No wheezes, rhonchi, or rales. GI: Soft, non-distended, and non-tender. MS: No lower extremity edema. No deformity. Skin: Warm and dry. Neuro:  No focal deficits. Psych: Normal affect. Responds appropriately.  Labs    High Sensitivity Troponin:   Recent Labs  Lab 10/10/21 1048 10/10/21 1535  TROPONINIHS 94* 85*     Chemistry Recent Labs  Lab 10/11/21 0236 10/11/21 0935 10/12/21 0243 10/13/21 0233 10/14/21 0506 10/15/21 0438  NA 134* 135 131* 136 135 135  K 3.0* 3.2* 3.4* 3.6 3.4* 3.5  CL 103 102 102 106 103 103  CO2 23 25 21* 22 23 23   GLUCOSE 101* 108* 96 96 111* 111*  BUN 21* 20 17 27* 23* 25*  CREATININE 2.01* 2.11* 1.81* 2.11* 2.09* 2.40*  CALCIUM 8.5* 8.8* 8.5* 9.0 9.0 9.0  MG 1.9  --  2.0 2.1  --   --   PROT 6.8 7.4 7.3 7.2  --   --   ALBUMIN 3.3* 3.5 3.6 3.4* 3.6 3.7  AST 21 23 21 20   --   --   ALT 10 11 11 10   --   --   ALKPHOS 81 91 92 95  --   --   BILITOT 0.9 1.0 1.0 0.9  --   --   GFRNONAA 41* 39* 47* 39* 39* 33*  ANIONGAP 8 8 8 8 9 9     Lipids No results for input(s): CHOL, TRIG, HDL, LABVLDL, LDLCALC, CHOLHDL in the last 168 hours.  Hematology Recent Labs  Lab 10/13/21 0233 10/14/21 0506 10/15/21 0438  WBC 7.6 6.6 6.5  RBC 3.22* 3.30* 3.37*  HGB 9.1* 9.7* 9.6*  HCT 28.8* 29.1* 29.8*  MCV 89.4 88.2 88.4  MCH 28.3 29.4 28.5  MCHC 31.6 33.3 32.2  RDW 17.4* 17.5* 17.1*  PLT 100* 120* 122*   Thyroid  Recent Labs  Lab 10/10/21 1758  TSH 1.002    BNPNo results for input(s): BNP, PROBNP in the last 168 hours.  DDimer  Recent Labs  Lab 10/10/21 1758  DDIMER 1.11*     Radiology    No results found.  Cardiovascular Studies   Echocardiogram 10/11/2021: Impressions:  1. Left ventricular ejection fraction, by estimation, is 55%. The left  ventricle has normal  function. The left ventricle has no regional wall  motion abnormalities. There is mild left ventricular hypertrophy. Left  ventricular diastolic parameters are  consistent with Grade II diastolic dysfunction (pseudonormalization).   2. Right ventricular systolic function is normal. The right ventricular  size is normal. Tricuspid regurgitation signal is inadequate for assessing  PA pressure.   3. Left atrial size was moderately dilated.   4. The mitral valve is normal in structure. Trivial mitral valve  regurgitation. No evidence of mitral stenosis.   5. The aortic valve is tricuspid. Aortic valve regurgitation is trivial.  No aortic stenosis is present.   6. The inferior vena cava is normal in size with greater than 50%  respiratory variability, suggesting right atrial pressure of 3 mmHg.  _______________  Lower Extremity Ultrasound 10/11/2021: Summary: RIGHT:  - There is no evidence of deep vein thrombosis in the lower extremity.  - No cystic structure found in the popliteal fossa.     LEFT:  - There is no evidence of deep vein thrombosis in the lower extremity.  - No cystic structure found in the popliteal fossa.  _______________  Renal Artery Ultrasound 10/12/2021: Summary:  Renal:  - Right: Normal size right kidney. 1-59% stenosis of the right renal artery. Normal right Resisitive Index.  - Left:  Normal size of left kidney. 1-59% stenosis of the left renal artery. Normal left Resistive Index.   Mesenteric:  - Normal Celiac artery and Superior Mesenteric artery findings.   Patient Profile     46 y.o. male with a history of hypertension, polysubstance abuse (tobacco, alcohol, marijuana), and medication noncompliance who was admitted on 10/10/2021 with hypertensive emergency after presenting with syncope.  Assessment & Plan    Hypertensive Emergency Patient presented with syncope and was found to be in hypertensive emergency. BP 217.145 on arrival. He reported not taking  any BP medications since moving to [place] in 05/2020. Echo showed normal LV function with mild LVH and grade 2 diastolic dysfunction. Renal artery ultrasound showed 1-59% stenosis of bilateral renal arteries. He has been started on multiple medications with significant improvement. BP 144/96 this morning before any medications. - Current medications: Amlodipine 10mg  daily, Coreg 25mg  twice daily, Hydralazine 100mg  three times daily, Spironolactone 50mg  daily,  and Clonidine 0.1mg  twice daily (which was started yesterday). Plan was to start Isordil yesterday but patient was concerned medications were causing headache; therefore, this was discontinued to by primary team and he was started on Clonidine instead (he never received a dose of Isordil). Headache has resolved today. - Continue current medications for now. Could consider trying Cardura or Isordil instead. - Aldosterone:renin ratio pending given hypokalemia on admission. - Continue to avoid use of NSAIDs.  Syncope Likely secondary to hypertensive emergency and possibly vasovagal component. EKG showed some ST elevation felt to be due to severe LVH. Echo showed normal LV function  with no significant valvular disease.  - No concerning arrhythmias on telemetry.  Elevated Troponin High-sensitivity troponin minimally elevated and flat at 94 >> 85. Not consistent with ACS. Echo reassuring as above. Consistent with demand ischemia in setting of hypertensive emergency. No ischemic work-up planned.  AKI vs CKD Creatinine 2.30 on admission. Unclear what is baseline is.  Creatinine initially improved some to 1.80 on 1/12 but has since trended back up. Renal artery ultrasound as above. - Creatine 2.40 today.  - Nephrology consulted but has now signed off. Serologies pending.They will follow-up as an outpatient.  Hypokalemia Potassium 2.7 on admission. Supplemented and started on Spironolactone.  - Potassium 3.5 today.  - Continue Spironolactone as above. -  Aldosterone:renin pending.  Otherwise, per primary team: - Anemia - Thrombocytopenia - Migraines - Epigastric pain - Polysubstance abuse  For questions or updates, please contact [place] Please consult [email or web address] for contact info under        Signed, Darreld Mclean, PA-C  10/15/2021, 7:34 AM    Personally seen and examined. Agree with APP above with the following comments: Briefly long standing persistent HTN and migraines  with a history of polysubstance abuse who presents with dizziness, syncope in the setting of hypertensive emergency Patient notes that despite the best BP control he has had this admission he has still have persistent headache.  We have reviewed his Fiorecet and sumatriptan We reviewed the role of clonidine in migraine prophylaxis and in rebound HTN and his need to continue this medication Exam notable for improved BP, benign exam Labs notable for creatinine 2.4 today Personally reviewed relevant tests; no ectopy of telemetry  Would recommend  - continue present antihypertensives, may need further increase in aldactone as outpatient, but given his increase in creatinine will hold for now - no cardiology barriers for discharge at this time  [place] will sign off.   Medication Recommendations:  clonidine 0.1 mg PO BID Aldactone 50 mg PO daily Coreg 25 mg PO BID Hydralazine 100 mg TID Smoking Cessation presently requiring nicotine patch Other recommendations (labs, testing, etc):  needs follow up BMP  Follow up as an outpatient:   Needs Nephrology f/u Will work on arranging cardiology follow up with Dr. Debara Pickett or POD Staying in [place], [place] and may need closer f/u in future  Rudean Haskell, MD Cardiologist [facility] [place]  [address] [place], [place] [address] [phone]  10:44 AM

## 2021-10-16 LAB — METANEPHRINES, URINE, 24 HOUR
Metaneph Total, Ur: 119 ug/L
Metanephrines, 24H Ur: 298 ug/24 hr — ABNORMAL HIGH (ref 58–276)
Normetanephrine, 24H Ur: 1065 ug/24 hr — ABNORMAL HIGH (ref 156–729)
Normetanephrine, Ur: 426 ug/L
Total Volume: 2500

## 2021-10-16 NOTE — Discharge Summary (Signed)
Physician Discharge Summary   Patient: Roy Randolph MRN: [id number] DOB: 08/25/1976  Admit date:     10/10/2021  Discharge date: 10/15/2021  Discharge Physician: Berle Mull   PCP: Pcp, No   Recommendations at discharge:   Follow-up with PCP as recommended  Discharge Diagnoses Principal Problem:   Hypertensive emergency due to non compliance Active Problems:   Epigastric pain   Elevated troponin   Abnormal EKG   AKI (acute kidney injury) (HCC) vs CKD stage IIIb   Hypokalemia   Syncope, vasovagal   NSAID long-term use   Migraine   H/O alcohol abuse   Normocytic anemia   Thrombocytopenia (HCC)   Vomiting   Left arm numbness and pain   Smoker   Marijuana use  Resolved Problems:   * No resolved hospital problems. [facility]   46 year old male with past medical history of essential hypertension, active smoker, history of alcohol abuse-sober since 1 year, from [place].  Presents with complaints of syncopal event, subacute left-sided arm numbness and epigastric pain as well as migraine.  Found to have abnormal EKG and hypertensive emergency. 1/12 cardiology consulted.  Echocardiogram completed.  Transfer out of the stepdown unit.  * Hypertensive emergency due to non compliance- (present on admission) Patient has prior history of hypertension, ran out of his medication in September 2022 as he moved from his state[place] to [place]. Systolic BP 433/295 on admission with severe headache, elevated troponin and epigastric pain as well as EKG changes. No blurred vision right now. No focal deficit at the time of my evaluation. Patient was started on IV nitroglycerin drip, discontinued on 1/11. Currently started on IV labetalol as needed, ordered Norvasc and hydralazine on a scheduled basis. UDS negative for cocaine. Appreciate cardiology assistance. Nephrology consulted as well for further assistance. Currently working up for secondary causes. Patient is concerned  with medication causing headache and therefore Isordil discontinued. Blood pressure well controlled after initiation of clonidine. On discharge regimen includes Norvasc 10 mg, clonidine 0.1 mg twice daily, carvedilol 25 mg twice daily, hydralazine 100 mg 3 times daily, Aldactone 50 mg daily. TOC was consulted.  They were able to provide patient's coupons.  Unable to provide any further assistance.  Abnormal EKG- (present on admission) Elevated troponin. History of sudden cardiac death in family. Patient presents with complaints of epigastric pain with hypertensive emergency. EKG shows evidence of anterior ST-T wave changes concerning for anterior STEMI. ED provider discussed with Dr. Raynelle Fanning on call.  Dr. Claiborne Billings felt that the patient does not have any STEMI. Serial troponins, not consistent with ACS, also trending down. Echocardiogram shows preserved EF without any abnormality.  Shows grade 2 diastolic dysfunction Appreciate cardiology consultation. Outpatient follow-up.  No further inpatient work-up.  Epigastric pain- (present on admission) Likely from an NSAID induced gastritis. Continue scheduled PPI. Normal lipase level.  Tolerating oral diet.  Syncope, vasovagal- (present on admission) Patient presents with a syncopal event. Appears to be more vasovagal event. No focal deficit.  CT head unremarkable. Echo shows no acute abnormality. Cardiology consult appreciated. Orthostatic vitals negative.  Hypokalemia- (present on admission) Potassium level stable now. Monitor.   AKI (acute kidney injury) ([place]) vs CKD stage IIIb- (present on admission) No prior serum creatinine to compare. Currently serum creatinine stable around 2. BUN is normal which makes me think this is chronic kidney disease stage IIIb rather than acute kidney injury. Was treated with IV hydration. Normal ultrasound renal. Nephrology consulted.  Appreciate assistance.  Outpatient follow-up recommended.   Serological work-up sent. Fena 2.2.  Suggesting intrinsic damage.  Hypoosmolar.  H/O alcohol abuse- (present on admission) 1 year ago patient was reportedly drinking 6 to 12 cans of beer every day and couple of shots every day. He only drinks occasionally at his last drink was around Christmas now.  Migraine- (present on admission) Patient has history of migraine.  Progressively worsened since September 2022 once he ran out of his blood pressure medications. Does not take any medication to help with prophylaxis. Takes 2-3 Excedrin 2-3 times a day to help with his headache. Imitrex for acute relief Fioricet as needed Will prescribe opioids for severe pain Amitriptyline for prophylaxis.  Coreg and Norvasc would benefit as well.  NSAID long-term use Significantly heavy use of NSAID's and has any worsening or time per patient. Patient likely have medication overuse headache. NSAIDS likely responsible for his gastritis as well as possible chronic blood loss anemia and acute kidney injury versus possible chronic kidney disease. Recommend he stop using NSAID's for now.  Thrombocytopenia ([place])- (present on admission) Likely in the setting of history of alcohol use. No evidence of schistocytes. Monitor for now.  Normocytic anemia- (present on admission) Possibility of microangiopathic hemolytic anemia  hemoglobin significant low.  Reticulocyte count elevated. Patient denies any acute bleeding history. Iron level normal, B12 level 374, relatively low.  Haptoglobin low.  LDH elevated. Hemoglobin and platelet count both improving now. While the patient potentially may have mild hemolytic anemia, at present treatment is blood pressure control per nephrology. Continue PPI twice daily. Patient is Jehovah's Witness.  May require EPO. Will benefit from outpatient oncology referral.  Left arm numbness and pain- (present on admission) Patient reports numbness and pain involving his left upper  extremity for last 3 to 4 weeks progressively worsening. CT C-spine shows evidence of mild degenerative changes without any high-grade stenosis. MRI C-spine negative for any cord compression but does show evidence of foraminal stenosis as well.   At present patient does not have any radicular symptoms.  Reports muscle spasm with his migraine episode.   Continue Robaxin. No further work-up for now outpatient follow-up with neurosurgery.  Vomiting- (present on admission) 1 episode in the ER after receiving pain medication. Monitor for now.  Smoker- (present on admission) Smokes 1 pack over 3 days. Nicotine patch. Currently willing to quit.  Marijuana use- (present on admission) Daily use. Monitor.      Pain control - [employer] Controlled Substance Reporting System database was reviewed. and patient was instructed, not to drive, operate heavy machinery, perform activities at heights, swimming or participation in water activities or provide baby-sitting services while on Pain, Sleep and Anxiety Medications; until their outpatient Physician has advised to do so again. Also recommended to not to take more than prescribed Pain, Sleep and Anxiety Medications.   Consultants: Cardiology, nephrology, phone consultation with neurosurgery Procedures performed: Echocardiogram   Disposition: Home Diet recommendation: Cardiac diet  DISCHARGE MEDICATION: Allergies as of 10/15/2021       Reactions   Fruit Extracts    In general all fruits and vegetable. Can eat a can of fruit.    Tomato (diagnostic) Swelling   Wild Lettuce [lactuca Virosa] Swelling        Medication List     STOP taking these medications    aspirin-acetaminophen-caffeine 250-250-65 MG tablet Commonly known as: EXCEDRIN MIGRAINE   ibuprofen 200 MG tablet Commonly known as: ADVIL   naproxen sodium 220 MG tablet Commonly known  as: ALEVE   NyQuil Severe Cold/Flu 5-6.25-10-325 MG/15ML Liqd Generic drug:  Phenyleph-Doxylamine-DM-APAP       TAKE these medications    acetaminophen 500 MG tablet Commonly known as: TYLENOL Take 1,000 mg by mouth every 6 (six) hours as needed for mild pain.   amitriptyline 25 MG tablet Commonly known as: ELAVIL Take 1 tablet (25 mg total) by mouth at bedtime.   amLODipine 10 MG tablet Commonly known as: NORVASC Take 1 tablet (10 mg total) by mouth daily.   B-complex with vitamin C tablet Take 1 tablet by mouth daily.   butalbital-acetaminophen-caffeine 50-325-40 MG tablet Commonly known as: FIORICET Take 1 tablet by mouth every 4 (four) hours as needed for headache or migraine.   carvedilol 25 MG tablet Commonly known as: COREG Take 1 tablet (25 mg total) by mouth 2 (two) times daily with a meal.   cloNIDine 0.1 MG tablet Commonly known as: CATAPRES Take 1 tablet (0.1 mg total) by mouth 2 (two) times daily.   cyanocobalamin 1000 MCG tablet Take 1 tablet (1,000 mcg total) by mouth daily.   hydrALAZINE 50 MG tablet Commonly known as: APRESOLINE Take 2 tablets (100 mg total) by mouth 3 (three) times daily.   methocarbamol 500 MG tablet Commonly known as: ROBAXIN Take 1 tablet (500 mg total) by mouth every 8 (eight) hours as needed for muscle spasms.   nicotine 14 mg/24hr patch Commonly known as: NICODERM CQ - dosed in mg/24 hours Place 1 patch (14 mg total) onto the skin daily.   pantoprazole 40 MG tablet Commonly known as: PROTONIX Take 1 tablet (40 mg total) by mouth daily.   spironolactone 50 MG tablet Commonly known as: ALDACTONE Take 1 tablet (50 mg total) by mouth daily.   SUMAtriptan 25 MG tablet Commonly known as: IMITREX Take 1 tablet (25 mg total) by mouth every 2 (two) hours as needed for migraine. May repeat in 2 hours if headache persists or recurs.        Follow-up Information     [facility]. Schedule an appointment as soon as possible for a visit in 2 week(s).   Specialty:  Cardiology Contact information: [address] [place] [phone]        [place], [place]. Schedule an appointment as soon as possible for a visit in 1 month(s).   Why: with CBC and BMP Contact information: [address] [place] [address] [place], [place]. Call.   Specialty: [place] Why: Call to see what the process is to switch over your Medicaid to [facility]. Contact information: [place] [address] [place]. Go on 11/12/2021.   Why: Appointment scheduled for Monday 11/12/2021 at 2:30pm with Dr. Wynetta Emery.  They can assist with getting you cheaper medications and Medicaid.  They will call you if anything opens up sooner. Contact information: [address] [place] [address] [phone]                Discharge Exam: Filed Weights   10/13/21 0500 10/14/21 0500 10/15/21 0500  Weight: 74.6 kg 76.3 kg 74.1 kg   Vitals:   10/15/21 0500 10/15/21 0529 10/15/21 0926 10/15/21 1401  BP:  (!) 144/96 (!) 152/109 (!) 136/95  Pulse:  80 88   Resp:  18  Temp:      TempSrc:  Oral    SpO2:  98%    Weight: 74.1 kg     Height:        General: Appear in mild distress, no Rash; Oral Mucosa Clear, moist. no Abnormal Neck Mass Or lumps, Conjunctiva normal  Cardiovascular: S1 and S2 Present, no Murmur, Respiratory: good respiratory effort, Bilateral Air entry present and CTA, no Crackles, no wheezes Abdomen: Bowel Sound present, Soft and no tenderness Extremities: no Pedal edema Neurology: alert and oriented to time, place, and person affect appropriate. no new focal deficit Gait not checked due to patient safety concerns   Condition at discharge: good  The results of significant diagnostics from this hospitalization (including imaging, microbiology, ancillary and  laboratory) are listed below for reference.   Imaging Studies: CT Head Wo Contrast  Result Date: 10/10/2021 CLINICAL DATA:  Neuro deficit, stroke suspected EXAM: CT HEAD WITHOUT CONTRAST TECHNIQUE: Contiguous axial images were obtained from the base of the skull through the vertex without intravenous contrast. RADIATION DOSE REDUCTION: This exam was performed according to the departmental dose-optimization program which includes automated exposure control, adjustment of the mA and/or kV according to patient size and/or use of iterative reconstruction technique. COMPARISON:  None. FINDINGS: Brain: There is no evidence of acute intracranial hemorrhage, extra-axial fluid collection, or acute infarct. Parenchymal volume is normal.  The ventricles are normal in size. There is no mass lesion.  There is no midline shift. Vascular: No hyperdense vessel or unexpected calcification. Skull: Normal. Negative for fracture or focal lesion. Sinuses/Orbits: The imaged paranasal sinuses are clear. The globes and orbits are unremarkable. Other: None. IMPRESSION: Normal head CT. Electronically Signed   By: Valetta Mole M.D.   On: 10/10/2021 12:19   CT Cervical Spine Wo Contrast  Result Date: 10/10/2021 CLINICAL DATA:  Cervical radiculopathy EXAM: CT CERVICAL SPINE WITHOUT CONTRAST TECHNIQUE: Multidetector CT imaging of the cervical spine was performed without intravenous contrast. Multiplanar CT image reconstructions were also generated. RADIATION DOSE REDUCTION: This exam was performed according to the departmental dose-optimization program which includes automated exposure control, adjustment of the mA and/or kV according to patient size and/or use of iterative reconstruction technique. COMPARISON:  None. FINDINGS: Alignment: There is straightening of the normal cervical spine lordosis. There is no antero or retrolisthesis. Skull base and vertebrae: Vertebral body heights are preserved. Skull base alignment is normal. There  is no evidence of acute fracture. There is no suspicious osseous lesion. Soft tissues and spinal canal: No prevertebral fluid or swelling. No visible canal hematoma. Disc levels: There is mild disc space narrowing with associated degenerative endplate change at L3-T3. There is mild left worse than right facet arthropathy at this level resulting in mild bilateral neural foraminal stenosis without significant spinal canal stenosis. There is mild left neural foraminal stenosis at C6-C7. There is no significant spinal canal or neural foraminal stenosis at the remaining levels. Upper chest: There is mild scarring in the lung apices. Other: None. IMPRESSION: 1. Mild degenerative changes at C5-C6 and C6-C7 resulting in mild right worse than left neural foraminal stenosis at C5-C6 and mild left neural foraminal stenosis at C6-C7. No high-grade spinal canal or neural foraminal stenosis. 2. No acute findings. Electronically Signed   By: Valetta Mole M.D.   On: 10/10/2021 12:23   MR CERVICAL SPINE WO CONTRAST  Result Date: 10/11/2021 CLINICAL DATA:  Initial evaluation for neck pain extending into the left upper extremity. EXAM: MRI CERVICAL SPINE WITHOUT CONTRAST TECHNIQUE:  Multiplanar, multisequence MR imaging of the cervical spine was performed. No intravenous contrast was administered. COMPARISON:  Prior CT from earlier the same day. FINDINGS: Alignment: Examination mildly degraded by motion artifact. Straightening of the normal cervical lordosis. No significant listhesis. Vertebrae: Vertebral body height maintained without acute or chronic fracture. Bone marrow signal intensity diffusely decreased on T1 weighted sequence, nonspecific, but most commonly related to anemia, smoking or obesity. No discrete or worrisome osseous lesions. No abnormal marrow edema. Cord: Evaluation of the cervical spinal cord somewhat limited by motion artifact. No convincing cord signal changes seen. Note made of parrot hazy T2/STIR signal  intensity seen about the cervicomedullary junction, favored to be artifactual. Normal cord caliber and morphology. Posterior Fossa, vertebral arteries, paraspinal tissues: Visualized brain and posterior fossa within normal limits. Craniocervical junction normal. Paraspinous and prevertebral soft tissues within normal limits. Normal flow voids seen within the vertebral arteries bilaterally. Disc levels: C2-C3: Minimal right-sided endplate spurring without significant disc bulge. No spinal stenosis. Foramina remain patent. C3-C4: Mild disc bulge with bilateral uncovertebral hypertrophy. Superimposed tiny central disc protrusion indents the ventral thecal sac (series 10, image 9). Mild spinal stenosis with minimal cord flattening but no cord signal changes. Mild to moderate left C4 foraminal stenosis. Right neural foramina remains patent. C4-C5: Mild disc bulge with uncovertebral spurring, asymmetric to the left. Bulging disc flattens and partially effaces the ventral thecal sac, worse on the left. Superimposed small central disc protrusion contacts the ventral cord (series 10, image 14). Mild spinal stenosis with minimal cord flattening but no cord signal changes. Mild to moderate left C5 foraminal stenosis. Right neural foramen remains patent. C5-C6: Mild disc bulge with bilateral uncovertebral hypertrophy. Broad post secondary flattening of the ventral thecal sac with resultant mild spinal stenosis. Moderate left worse than right C6 foraminal narrowing. C6-C7: Mild disc bulge with left worse than right uncovertebral spurring. No spinal stenosis. Moderate left C7 foraminal narrowing. Right neural foramen remains patent. C7-T1: Negative interspace. Left greater than right facet hypertrophy. No canal or foraminal stenosis. Visualized upper thoracic spine demonstrates no significant finding. IMPRESSION: 1. Multilevel cervical spondylosis with resultant mild spinal stenosis at C3-4 through C5-6. 2. Multifactorial  degenerative changes with resultant mild to moderate left C4 through C7 foraminal stenosis as above. Findings could contribute to left upper extremity radicular symptoms. Electronically Signed   By: Jeannine Boga M.D.   On: 10/11/2021 00:30   US RENAL  Result Date: 10/10/2021 CLINICAL DATA:  Acute kidney injury EXAM: RENAL / URINARY TRACT ULTRASOUND COMPLETE COMPARISON:  None. FINDINGS: Right Kidney: Renal measurements: 9.4 x 4.8 x 4.4 cm = volume: 102.1 mL. Echogenicity within normal limits. No mass or hydronephrosis visualized. Left Kidney: Renal measurements: 9.1 x 4.7 x 4.8 cm = volume: 106.4 mL. Echogenicity within normal limits. No mass or hydronephrosis visualized. Bladder: Appears normal for degree of bladder distention. Other: None. IMPRESSION: Negative examination Electronically Signed   By: Donavan Foil M.D.   On: 10/10/2021 20:36   DG Chest Port 1 View  Result Date: 10/10/2021 CLINICAL DATA:  Shortness of breath over the last few days. EXAM: PORTABLE CHEST 1 VIEW COMPARISON:  None. FINDINGS: Artifact overlies the chest. Heart and mediastinal shadows are normal. The lungs are clear. The vascularity is normal. No significant bone finding. IMPRESSION: No active disease. Electronically Signed   By: Nelson Chimes M.D.   On: 10/10/2021 11:38   ECHOCARDIOGRAM COMPLETE  Result Date: 10/11/2021    ECHOCARDIOGRAM REPORT   Patient Name:  Roy Randolph Date of Exam: 10/11/2021 Medical Rec #:  [id number]          Height:       69.5 in Accession #:    [id number]         Weight:       170.0 lb Date of Birth:  1975-11-27          BSA:          1.938 m Patient Age:    42 years           BP:           171/112 mmHg Patient Gender: M                  HR:           82 bpm. Exam Location:  Inpatient Procedure: 2D Echo, Cardiac Doppler and Color Doppler Indications:    SYNCOPE  History:        Patient has no prior history of Echocardiogram examinations.                 Abnormal ECG,  Signs/Symptoms:Syncope; Risk Factors:Hypertension                 and Current Smoker. ELEV, TROPONIN / ETOH Graylon Good.  Sonographer:    Beryle Beams Referring Phys: [id number] [place]  1. Left ventricular ejection fraction, by estimation, is 55%. The left ventricle has normal function. The left ventricle has no regional wall motion abnormalities. There is mild left ventricular hypertrophy. Left ventricular diastolic parameters are consistent with Grade II diastolic dysfunction (pseudonormalization).  2. Right ventricular systolic function is normal. The right ventricular size is normal. Tricuspid regurgitation signal is inadequate for assessing PA pressure.  3. Left atrial size was moderately dilated.  4. The mitral valve is normal in structure. Trivial mitral valve regurgitation. No evidence of mitral stenosis.  5. The aortic valve is tricuspid. Aortic valve regurgitation is trivial. No aortic stenosis is present.  6. The inferior vena cava is normal in size with greater than 50% respiratory variability, suggesting right atrial pressure of 3 mmHg. FINDINGS  Left Ventricle: Left ventricular ejection fraction, by estimation, is 55%. The left ventricle has normal function. The left ventricle has no regional wall motion abnormalities. The left ventricular internal cavity size was normal in size. There is mild left ventricular hypertrophy. Left ventricular diastolic parameters are consistent with Grade II diastolic dysfunction (pseudonormalization). Right Ventricle: The right ventricular size is normal. No increase in right ventricular wall thickness. Right ventricular systolic function is normal. Tricuspid regurgitation signal is inadequate for assessing PA pressure. Left Atrium: Left atrial size was moderately dilated. Right Atrium: Right atrial size was normal in size. Pericardium: There is no evidence of pericardial effusion. Mitral Valve: The mitral valve is normal in structure. Trivial mitral  valve regurgitation. No evidence of mitral valve stenosis. MV peak gradient, 45.4 mmHg. The mean mitral valve gradient is 18.0 mmHg. Tricuspid Valve: The tricuspid valve is normal in structure. Tricuspid valve regurgitation is not demonstrated. Aortic Valve: The aortic valve is tricuspid. Aortic valve regurgitation is trivial. Aortic regurgitation PHT measures 1460 msec. No aortic stenosis is present. Aortic valve mean gradient measures 4.0 mmHg. Aortic valve peak gradient measures 7.7 mmHg. Aortic valve area, by VTI measures 3.25 cm. Pulmonic Valve: The pulmonic valve was normal in structure. Pulmonic valve regurgitation is not visualized. Aorta: The aortic root is normal in size and structure. Venous: The inferior vena  cava is normal in size with greater than 50% respiratory variability, suggesting right atrial pressure of 3 mmHg. IAS/Shunts: No atrial level shunt detected by color flow Doppler.  LEFT VENTRICLE PLAX 2D LVIDd:         5.00 cm      Diastology LVIDs:         3.50 cm      LV e' medial:    8.55 cm/s LV PW:         1.30 cm      LV E/e' medial:  10.6 LV IVS:        1.30 cm      LV e' lateral:   9.95 cm/s LVOT diam:     2.20 cm      LV E/e' lateral: 9.1 LV SV:         92 LV SV Index:   47 LVOT Area:     3.80 cm  LV Volumes (MOD) LV vol d, MOD A2C: 94.7 ml LV vol d, MOD A4C: 112.0 ml LV vol s, MOD A2C: 37.6 ml LV vol s, MOD A4C: 45.8 ml LV SV MOD A2C:     57.1 ml LV SV MOD A4C:     112.0 ml LV SV MOD BP:      57.9 ml RIGHT VENTRICLE RV S prime:     16.30 cm/s TAPSE (M-mode): 2.4 cm LEFT ATRIUM             Index        RIGHT ATRIUM           Index LA diam:        4.10 cm 2.12 cm/m   RA Area:     13.00 cm LA Vol (A2C):   77.6 ml 40.05 ml/m  RA Volume:   26.70 ml  13.78 ml/m LA Vol (A4C):   81.9 ml 42.27 ml/m LA Biplane Vol: 80.0 ml 41.29 ml/m  AORTIC VALVE                    PULMONIC VALVE AV Area (Vmax):    2.84 cm     PV Vmax:       0.88 m/s AV Area (Vmean):   2.77 cm     PV Peak grad:  3.1 mmHg  AV Area (VTI):     3.25 cm AV Vmax:           139.00 cm/s AV Vmean:          94.900 cm/s AV VTI:            0.283 m AV Peak Grad:      7.7 mmHg AV Mean Grad:      4.0 mmHg LVOT Vmax:         104.00 cm/s LVOT Vmean:        69.200 cm/s LVOT VTI:          0.242 m LVOT/AV VTI ratio: 0.86 AI PHT:            1460 msec  AORTA Ao Root diam: 3.30 cm Ao Asc diam:  3.20 cm MITRAL VALVE MV Area (PHT): 4.80 cm    SHUNTS MV Area VTI:   1.35 cm    Systemic VTI:  0.24 m MV Peak grad:  45.4 mmHg   Systemic Diam: 2.20 cm MV Mean grad:  18.0 mmHg MV Vmax:       3.37 m/s MV Vmean:      177.0 cm/s MV Decel  Time: 158 msec MV E velocity: 90.70 cm/s MV A velocity: 72.80 cm/s MV E/A ratio:  1.25 Dalton McleanMD Electronically signed by Franki Monte Signature Date/Time: 10/11/2021/4:29:31 PM    Final    VAS [place] LOWER EXTREMITY VENOUS (DVT)  Result Date: 10/11/2021  Lower Venous DVT Study Patient Name:  Roy Randolph  Date of Exam:   10/11/2021 Medical Rec #: [id number]           Accession #:    [id number] Date of Birth: 1975-12-23           Patient Gender: M Patient Age:   38 years Exam Location:  [facility] Procedure:      VAS [place] LOWER EXTREMITY VENOUS (DVT) Referring Phys: Leia Coletti --------------------------------------------------------------------------------  Indications: Elevated Ddimer.  Risk Factors: None identified. Comparison Study: No prior studies. Performing Technologist: Oliver Hum RVT  Examination Guidelines: A complete evaluation includes B-mode imaging, spectral Doppler, color Doppler, and power Doppler as needed of all accessible portions of each vessel. Bilateral testing is considered an integral part of a complete examination. Limited examinations for reoccurring indications may be performed as noted. The reflux portion of the exam is performed with the patient in reverse Trendelenburg.  +---------+---------------+---------+-----------+----------+--------------+  RIGHT      Compressibility Phasicity Spontaneity Properties Thrombus Aging  +---------+---------------+---------+-----------+----------+--------------+  CFV       Full            Yes       Yes                                    +---------+---------------+---------+-----------+----------+--------------+  SFJ       Full                                                             +---------+---------------+---------+-----------+----------+--------------+  FV Prox   Full                                                             +---------+---------------+---------+-----------+----------+--------------+  FV Mid    Full                                                             +---------+---------------+---------+-----------+----------+--------------+  FV Distal Full                                                             +---------+---------------+---------+-----------+----------+--------------+  PFV       Full                                                             +---------+---------------+---------+-----------+----------+--------------+  POP       Full            Yes       Yes                                    +---------+---------------+---------+-----------+----------+--------------+  PTV       Full                                                             +---------+---------------+---------+-----------+----------+--------------+  PERO      Full                                                             +---------+---------------+---------+-----------+----------+--------------+   +---------+---------------+---------+-----------+----------+--------------+  LEFT      Compressibility Phasicity Spontaneity Properties Thrombus Aging  +---------+---------------+---------+-----------+----------+--------------+  CFV       Full            Yes       Yes                                    +---------+---------------+---------+-----------+----------+--------------+  SFJ       Full                                                              +---------+---------------+---------+-----------+----------+--------------+  FV Prox   Full                                                             +---------+---------------+---------+-----------+----------+--------------+  FV Mid    Full                                                             +---------+---------------+---------+-----------+----------+--------------+  FV Distal Full                                                             +---------+---------------+---------+-----------+----------+--------------+  PFV       Full                                                             +---------+---------------+---------+-----------+----------+--------------+  POP       Full            Yes       Yes                                    +---------+---------------+---------+-----------+----------+--------------+  PTV       Full                                                             +---------+---------------+---------+-----------+----------+--------------+  PERO      Full                                                             +---------+---------------+---------+-----------+----------+--------------+     Summary: RIGHT: - There is no evidence of deep vein thrombosis in the lower extremity.  - No cystic structure found in the popliteal fossa.  LEFT: - There is no evidence of deep vein thrombosis in the lower extremity.  - No cystic structure found in the popliteal fossa.  *See table(s) above for measurements and observations. Electronically signed by Orlie Pollen on 10/11/2021 at 3:58:30 PM.    Final    VAS US RENAL ARTERY DUPLEX  Result Date: 10/12/2021 ABDOMINAL VISCERAL Patient Name:  Roy Randolph  Date of Exam:   10/12/2021 Medical Rec #: [id number]           Accession #:    [id number] Date of Birth: 08-10-1976           Patient Gender: M Patient Age:   89 years Exam Location:  [facility] Procedure:      VAS US RENAL ARTERY DUPLEX Referring Phys: [id number]  [facility] M Gaylan Fauver -------------------------------------------------------------------------------- Indications: Hypertension High Risk Factors: Hypertension. Comparison Study: No prior studies. Performing Technologist: Oliver Hum RVT  Examination Guidelines: A complete evaluation includes B-mode imaging, spectral Doppler, color Doppler, and power Doppler as needed of all accessible portions of each vessel. Bilateral testing is considered an integral part of a complete examination. Limited examinations for reoccurring indications may be performed as noted.  Duplex Findings: +--------------------+--------+--------+------+--------+  Mesenteric           PSV cm/s EDV cm/s Plaque Comments  +--------------------+--------+--------+------+--------+  Aorta Mid              106       6                      +--------------------+--------+--------+------+--------+  Celiac Artery Origin   140                              +--------------------+--------+--------+------+--------+  SMA Proximal           150       34                     +--------------------+--------+--------+------+--------+    +------------------+--------+--------+-------+  Right Renal Artery PSV cm/s EDV cm/s Comment  +------------------+--------+--------+-------+  Origin                58       9              +------------------+--------+--------+-------+  Proximal             106       12             +------------------+--------+--------+-------+  Mid                   46       11             +------------------+--------+--------+-------+  Distal                47       10             +------------------+--------+--------+-------+ +-----------------+--------+--------+-------+  Left Renal Artery PSV cm/s EDV cm/s Comment  +-----------------+--------+--------+-------+  Origin              100       7              +-----------------+--------+--------+-------+  Proximal             90       16             +-----------------+--------+--------+-------+  Mid                   36       11             +-----------------+--------+--------+-------+  Distal               38       12             +-----------------+--------+--------+-------+ +------------+--------+--------+----+-----------+--------+--------+----+  Right Kidney PSV cm/s EDV cm/s RI   Left Kidney PSV cm/s EDV cm/s RI    +------------+--------+--------+----+-----------+--------+--------+----+  Upper Pole   22       7        0.70 Upper Pole  20       4        0.78  +------------+--------+--------+----+-----------+--------+--------+----+  Mid          15       5        0.66 Mid         19       5        0.75  +------------+--------+--------+----+-----------+--------+--------+----+  Lower Pole   24       6        0.75 Lower Pole  16       6        0.61  +------------+--------+--------+----+-----------+--------+--------+----+  Hilar        22       6        0.73 Hilar       30       9        0.68  +------------+--------+--------+----+-----------+--------+--------+----+ +------------------+-----+------------------+-----+  Right Kidney             Left Kidney               +------------------+-----+------------------+-----+  RAR                      RAR                       +------------------+-----+------------------+-----+  RAR (manual)       1     RAR (manual)       0.94   +------------------+-----+------------------+-----+  Cortex                   Cortex                    +------------------+-----+------------------+-----+  Cortex thickness         Corex thickness           +------------------+-----+------------------+-----+  Kidney length (cm) 10.00 Kidney length (cm) 10.00  +------------------+-----+------------------+-----+  Summary: Renal:  Right: Normal size right kidney. 1-59% stenosis of the right renal        artery. Normal right Resisitive Index. Left:  Normal size of left kidney. 1-59% stenosis of the left renal        artery. Normal left Resistive Index. Mesenteric: Normal Celiac artery and Superior Mesenteric  artery findings.  *See table(s) above for measurements and observations.  Diagnosing physician: Jamelle Haring  Electronically signed by Jamelle Haring on 10/12/2021 at 2:47:56 PM.    Final     Microbiology: Results for orders placed or performed during the hospital encounter of 10/10/21  Resp Panel by RT-PCR (Flu A&B, Covid) Nasopharyngeal Swab     Status: None   Collection Time: 10/10/21 10:48 AM   Specimen: Nasopharyngeal Swab; Nasopharyngeal(NP) swabs in vial transport medium  Result Value Ref Range Status   SARS Coronavirus 2 by RT PCR NEGATIVE NEGATIVE Final    Comment: (NOTE) SARS-CoV-2 target nucleic acids are NOT DETECTED.  The SARS-CoV-2 RNA is generally detectable in upper respiratory specimens during the acute phase of infection. The lowest concentration of SARS-CoV-2 viral copies this assay can detect is 138 copies/mL. A negative result does not preclude SARS-Cov-2 infection and should not be used as the sole basis for treatment or other patient management decisions. A negative result may occur with  improper specimen collection/handling, submission of specimen other than nasopharyngeal swab, presence of viral mutation(s) within the areas targeted by this assay, and inadequate number of viral copies(<138 copies/mL). A negative result must be combined with clinical observations, patient history, and epidemiological information. The expected result is Negative.  Fact Sheet for Patients:  EntrepreneurPulse.com.au  Fact Sheet for Healthcare Providers:  IncredibleEmployment.be  This test is no t yet approved or cleared by the [place] FDA and  has been authorized for detection and/or diagnosis of SARS-CoV-2 by FDA under an Emergency Use Authorization (EUA). This EUA will remain  in effect (meaning this test can be used) for the duration of the COVID-19 declaration under Section 564(b)(1) of the Act, 21 U.S.C.section 360bbb-3(b)(1),  unless the authorization is terminated  or revoked sooner.       Influenza A by PCR NEGATIVE NEGATIVE Final   Influenza B by PCR NEGATIVE NEGATIVE Final    Comment: (NOTE) The Xpert Xpress SARS-CoV-2/FLU/RSV plus assay is intended as an aid in the diagnosis of influenza from Nasopharyngeal swab specimens and should not be used as a sole basis for treatment. Nasal washings and aspirates are unacceptable for Xpert Xpress SARS-CoV-2/FLU/RSV testing.  Fact Sheet for Patients: EntrepreneurPulse.com.au  Fact Sheet for Healthcare Providers: IncredibleEmployment.be  This test is not yet approved or cleared by the [place] FDA and has been authorized for detection and/or diagnosis of SARS-CoV-2 by FDA under an Emergency Use Authorization (EUA). This EUA will remain in effect (meaning this test can be used) for the duration of the COVID-19  declaration under Section 564(b)(1) of the Act, 21 U.S.C. section 360bbb-3(b)(1), unless the authorization is terminated or revoked.  Performed at [facility], [place] [address], [place], [place] [address]   MRSA Next Gen by PCR, Nasal     Status: None   Collection Time: 10/10/21  8:16 PM   Specimen: Nasal Mucosa; Nasal Swab  Result Value Ref Range Status   MRSA by PCR Next Gen NOT DETECTED NOT DETECTED Final    Comment: (NOTE) The GeneXpert MRSA Assay (FDA approved for NASAL specimens only), is one component of a comprehensive MRSA colonization surveillance program. It is not intended to diagnose MRSA infection nor to guide or monitor treatment for MRSA infections. Test performance is not FDA approved in patients less than 25 years old. Performed at [facility], [address] [place], [place] [address]     Labs: CBC: Recent Labs  Lab 10/11/21 0236 10/11/21 0935 10/12/21 0243 10/13/21 0233 10/14/21 0506 10/15/21 0438  WBC 8.9 9.1 7.9 7.6 6.6 6.5  NEUTROABS  5.7  --  5.6 4.6 4.2 3.5  HGB 8.6* 9.4* 9.4* 9.1* 9.7* 9.6*  HCT 26.2* 28.6* 28.8* 28.8* 29.1* 29.8*  MCV 88.2 87.7 88.1 89.4 88.2 88.4  PLT 88* 97* 93* 100* 120* 983*   Basic Metabolic Panel: Recent Labs  Lab 10/10/21 1758 10/11/21 0236 10/11/21 0935 10/12/21 0243 10/13/21 0233 10/14/21 0506 10/15/21 0438  NA 136 134* 135 131* 136 135 135  K 3.0* 3.0* 3.2* 3.4* 3.6 3.4* 3.5  CL 101 103 102 102 106 103 103  CO2 26 23 25  21* 22 23 23   GLUCOSE 131* 101* 108* 96 96 111* 111*  BUN 22* 21* 20 17 27* 23* 25*  CREATININE 2.31* 2.01* 2.11* 1.81* 2.11* 2.09* 2.40*  CALCIUM 8.8* 8.5* 8.8* 8.5* 9.0 9.0 9.0  MG 2.0 1.9  --  2.0 2.1  --   --   PHOS  --   --   --   --   --  3.0 2.8   Liver Function Tests: Recent Labs  Lab 10/10/21 1758 10/11/21 0236 10/11/21 0935 10/12/21 0243 10/13/21 0233 10/14/21 0506 10/15/21 0438  AST 25 21 23 21 20   --   --   ALT 13 10 11 11 10   --   --   ALKPHOS 98 81 91 92 95  --   --   BILITOT 1.1 0.9 1.0 1.0 0.9  --   --   PROT 7.9 6.8 7.4 7.3 7.2  --   --   ALBUMIN 3.9 3.3* 3.5 3.6 3.4* 3.6 3.7   CBG: Recent Labs  Lab 10/12/21 0407 10/13/21 0711 10/14/21 0522 10/15/21 0527  GLUCAP 93 110* 109* 129*    Discharge time spent: greater than 30 minutes.  Signed: Berle Mull, MD [facility]

## 2021-10-23 LAB — ALDOSTERONE + RENIN ACTIVITY W/ RATIO
ALDO / PRA Ratio: 0.5 (ref 0.0–30.0)
Aldosterone: 2.4 ng/dL (ref 0.0–30.0)
PRA LC/MS/MS: 5.214 ng/mL/hr (ref 0.167–5.380)

## 2021-11-12 ENCOUNTER — Encounter: Payer: Self-pay | Admitting: Internal Medicine

## 2021-11-12 ENCOUNTER — Other Ambulatory Visit: Payer: Self-pay

## 2021-11-12 ENCOUNTER — Ambulatory Visit: Payer: Medicaid Other | Attending: Internal Medicine | Admitting: Internal Medicine

## 2021-11-12 VITALS — BP 157/94 | HR 81 | Resp 16 | Ht 69.0 in | Wt 169.0 lb

## 2021-11-12 DIAGNOSIS — N1832 Chronic kidney disease, stage 3b: Secondary | ICD-10-CM

## 2021-11-12 DIAGNOSIS — Z8669 Personal history of other diseases of the nervous system and sense organs: Secondary | ICD-10-CM

## 2021-11-12 DIAGNOSIS — Z7689 Persons encountering health services in other specified circumstances: Secondary | ICD-10-CM

## 2021-11-12 DIAGNOSIS — D638 Anemia in other chronic diseases classified elsewhere: Secondary | ICD-10-CM | POA: Diagnosis not present

## 2021-11-12 DIAGNOSIS — I10 Essential (primary) hypertension: Secondary | ICD-10-CM | POA: Diagnosis not present

## 2021-11-12 DIAGNOSIS — D696 Thrombocytopenia, unspecified: Secondary | ICD-10-CM

## 2021-11-12 DIAGNOSIS — T50905A Adverse effect of unspecified drugs, medicaments and biological substances, initial encounter: Secondary | ICD-10-CM

## 2021-11-12 DIAGNOSIS — K2970 Gastritis, unspecified, without bleeding: Secondary | ICD-10-CM

## 2021-11-12 DIAGNOSIS — F1091 Alcohol use, unspecified, in remission: Secondary | ICD-10-CM

## 2021-11-12 DIAGNOSIS — H539 Unspecified visual disturbance: Secondary | ICD-10-CM

## 2021-11-12 DIAGNOSIS — Z87891 Personal history of nicotine dependence: Secondary | ICD-10-CM

## 2021-11-12 MED ORDER — AMLODIPINE BESYLATE 10 MG PO TABS
10.0000 mg | ORAL_TABLET | Freq: Every day | ORAL | 4 refills | Status: DC
  Filled 2021-11-12: qty 30, 30d supply, fill #0

## 2021-11-12 MED ORDER — HYDRALAZINE HCL 50 MG PO TABS
50.0000 mg | ORAL_TABLET | Freq: Three times a day (TID) | ORAL | 4 refills | Status: DC
  Filled 2021-11-12: qty 90, 30d supply, fill #0

## 2021-11-12 MED ORDER — CYANOCOBALAMIN 1000 MCG PO TABS
1000.0000 ug | ORAL_TABLET | Freq: Every day | ORAL | 3 refills | Status: DC
  Filled 2021-11-12: qty 30, 30d supply, fill #0

## 2021-11-12 MED ORDER — METHOCARBAMOL 500 MG PO TABS
500.0000 mg | ORAL_TABLET | Freq: Every day | ORAL | 0 refills | Status: DC | PRN
  Filled 2021-11-12: qty 30, 30d supply, fill #0

## 2021-11-12 MED ORDER — SUMATRIPTAN SUCCINATE 25 MG PO TABS
ORAL_TABLET | ORAL | 0 refills | Status: DC
  Filled 2021-11-12: qty 10, 30d supply, fill #0

## 2021-11-12 MED ORDER — AMITRIPTYLINE HCL 25 MG PO TABS
25.0000 mg | ORAL_TABLET | Freq: Every day | ORAL | 4 refills | Status: DC
  Filled 2021-11-12: qty 30, 30d supply, fill #0

## 2021-11-12 MED ORDER — SPIRONOLACTONE 50 MG PO TABS
50.0000 mg | ORAL_TABLET | Freq: Every day | ORAL | 4 refills | Status: DC
  Filled 2021-11-12: qty 30, 30d supply, fill #0

## 2021-11-12 MED ORDER — PANTOPRAZOLE SODIUM 40 MG PO TBEC
40.0000 mg | DELAYED_RELEASE_TABLET | Freq: Every day | ORAL | 4 refills | Status: DC
  Filled 2021-11-12: qty 30, 30d supply, fill #0

## 2021-11-12 NOTE — Patient Instructions (Addendum)
Try to reintroduce the carvedilol after 3 days.  If you develop itching and hives, stop the medication. I have sent your prescriptions to our pharmacy. You should try to apply for the orange card/cone discount card. Try to get in with an eye doctor to have your vision evaluated.

## 2021-11-12 NOTE — Progress Notes (Signed)
Patient ID: Roy Randolph, male    DOB: May 03, 1976  MRN: [id number]  CC: Hospitalization Follow-up   Subjective: Roy Randolph is a 46 y.o. male who presents for new pt visit and hosp f/u His concerns today include:  Patient with history of HTN, tobacco dependence, EtOH use disorder in remission, migraines, anemia/thrombocytopenia  Patient presents to establish care and for hospital follow-up.  Patient had relocated here from [place] 1 year ago.  He had not established with a PCP.  Hospitalized 1/11-16/2023 with complaints of syncopal event, subacute left-sided numbness, epigastric pain and headache. Found to have abnormal EKG and hypertensive emergency with blood pressure of 204/125.  Troponin elevated.  Assessed by cardiologist Dr. Claiborne Billings not to have an STEMI.  Patient treated for the hypertensive emergency.  Epigastric pain thought to be due to NSAID induced gastritis from frequent use of NSAIDs for headache.  Started on PPI -Syncope thought to be vasovagal.  CT of head negative.  Echo revealed no regional wall abnormalities, normal EF, mild left ventricular hypertrophy with grade 2 diastolic dysfunction. -Found to have AKA versus CKD stage IIIb with creatinine around 2.  GFR 30s to 40s.  GFR at the time of discharge 33.  Kidney ultrasound normal.  Seen by nephrology. -Found to have thrombocytopenia with normocytic anemia.  Former thought to be due to to history of EtOH use.  Iron studies consistent with ACD. B12 level 374.  Placed on B12 supplement. -Patient complained of left arm numbness and pain.  CT of the C-spine showed mild degenerative changes without any high-grade stenosis.  MRI or I of C-spine revealed multilevel cervical spondylosis with mild spinal stenosis at C3-4 through C5-6 and degenerative changes. -Patient discharged on several blood pressure medications including amlodipine 10 mg daily, carvedilol 25 mg twice a day, clonidine 0.1 mg twice a day, carvedilol 100 mg  3 times a day, spironolactone 50 mg daily.  Today: Since hospital discharge, patient did not have the hydralazine.  He was just able to get the carvedilol and clonidine 1 week ago.  However he reports generalized body itching with hives that occurred this past weekend.  He attributes it to carvedilol or clonidine.  He stopped both and took some Benadryl.  Took a clonidine today.  Had mild itching but not like what he had over the weekend. -He is uninsured.  Had Medicaid in [place].  Just recently applied for Medicaid in [place].  He was working Architect as a Chief Strategy Officer.  He plans to apply for disability this week based on having had a pneumothorax in the past and his current condition.  Complains of having problems with his eyes.  He wears glasses for distance.  Since being on blood pressure medications he has been seeing "blotches."  Reports that he quit drinking 2 years ago.  Still smokes marijuana about every other day.  He quit cigarettes after this recent hospitalization.  Requesting refill on his migraine medication and the Protonix Patient Active Problem List   Diagnosis Date Noted   Hypertensive emergency due to non compliance 10/10/2021   AKI (acute kidney injury) ([place]) vs CKD stage IIIb 10/10/2021   Vomiting 10/10/2021   Epigastric pain 10/10/2021   Hypokalemia 10/10/2021   Elevated troponin 10/10/2021   Normocytic anemia 10/10/2021   Thrombocytopenia ([place]) 10/10/2021   NSAID long-term use 10/10/2021   Left arm numbness and pain 10/10/2021   Migraine 10/10/2021   Smoker 10/10/2021   Marijuana use 10/10/2021   H/O  alcohol abuse 10/10/2021   Abnormal EKG 10/10/2021   Syncope, vasovagal 10/10/2021     Current Outpatient Medications on File Prior to Visit  Medication Sig Dispense Refill   acetaminophen (TYLENOL) 500 MG tablet Take 1,000 mg by mouth every 6 (six) hours as needed for mild pain.     butalbital-acetaminophen-caffeine (FIORICET) 50-325-40 MG tablet Take  1 tablet by mouth every 4 (four) hours as needed for headache or migraine. 14 tablet 0   carvedilol (COREG) 25 MG tablet Take 1 tablet (25 mg total) by mouth 2 (two) times daily with a meal. 60 tablet 0   cloNIDine (CATAPRES) 0.1 MG tablet Take 1 tablet (0.1 mg total) by mouth 2 (two) times daily. 60 tablet 11   B Complex-C (B-COMPLEX WITH VITAMIN C) tablet Take 1 tablet by mouth daily. (Patient not taking: Reported on 11/12/2021) 30 tablet 0   No current facility-administered medications on file prior to visit.    Allergies  Allergen Reactions   Fruit Extracts     In general all fruits and vegetable. Can eat a can of fruit.    Tomato (Diagnostic) Swelling   Wild Lettuce [Lactuca Virosa] Swelling    Social History   Socioeconomic History   Marital status: Single    Spouse name: Not on file   Number of children: Not on file   Years of education: Not on file   Highest education level: Not on file  Occupational History   Not on file  Tobacco Use   Smoking status: Every Day    Packs/day: 0.30    Types: Cigarettes    Start date: 1998   Smokeless tobacco: Not on file  Substance and Sexual Activity   Alcohol use: Not Currently    Alcohol/week: 12.0 standard drinks    Types: 12 Cans of beer per week    Comment: stopped heavy drinking 2021   Drug use: Yes    Types: Marijuana   Sexual activity: Not on file  Other Topics Concern   Not on file  Social History Narrative   Not on file   Social Determinants of Health   Financial Resource Strain: Not on file  Food Insecurity: Not on file  Transportation Needs: Not on file  Physical Activity: Not on file  Stress: Not on file  Social Connections: Not on file  Intimate Partner Violence: Not on file    Family History  Problem Relation Age of Onset   Hypertension Mother    Cervical cancer Mother    Breast cancer Sister    Sudden Cardiac Death Brother 53   Heart disease Maternal Grandmother    Cancer Maternal Grandmother       ROS: Review of Systems Negative except as stated above  PHYSICAL EXAM: BP (!) 157/94    Pulse 81    Resp 16    Ht 5\' 9"  (1.753 m)    Wt 169 lb (76.7 kg)    SpO2 100%    BMI 24.96 kg/m   Physical Exam   General appearance - alert, well appearing, middle-age African-American male and in no distress Mental status - normal mood, behavior, speech, dress, motor activity, and thought processes Neck - supple, no significant adenopathy Chest - clear to auscultation, no wheezes, rales or rhonchi, symmetric air entry Heart - normal rate, regular rhythm, normal S1, S2, no murmurs, rubs, clicks or gallops Extremities -no lower extremity edema.  SPE: Pattern appears unremarkable.  No evidence of monoclonal protein. ANA negative. Anti- ds DNA:  Negative  Lab Results  Component Value Date   IRON 49 10/10/2021   TIBC 303 10/10/2021   FERRITIN 147 10/10/2021    CMP Latest Ref Rng & Units 11/12/2021 10/15/2021 10/14/2021  Glucose 70 - 99 mg/dL 85 111(H) 111(H)  BUN 6 - 24 mg/dL 33(H) 25(H) 23(H)  Creatinine 0.76 - 1.27 mg/dL 3.72(H) 2.40(H) 2.09(H)  Sodium 134 - 144 mmol/L 138 135 135  Potassium 3.5 - 5.2 mmol/L 4.5 3.5 3.4(L)  Chloride 96 - 106 mmol/L 101 103 103  CO2 20 - 29 mmol/L 20 23 23   Calcium 8.7 - 10.2 mg/dL 9.3 9.0 9.0  Total Protein 6.5 - 8.1 g/dL - - -  Total Bilirubin 0.3 - 1.2 mg/dL - - -  Alkaline Phos 38 - 126 U/L - - -  AST 15 - 41 U/L - - -  ALT 0 - 44 U/L - - -   Lipid Panel  No results found for: CHOL, TRIG, HDL, CHOLHDL, VLDL, LDLCALC, LDLDIRECT  CBC    Component Value Date/Time   WBC 7.9 11/12/2021 1529   WBC 6.5 10/15/2021 0438   RBC 3.31 (L) 11/12/2021 1529   RBC 3.37 (L) 10/15/2021 0438   HGB 9.0 (L) 11/12/2021 1529   HCT 27.6 (L) 11/12/2021 1529   PLT 287 11/12/2021 1529   MCV 83 11/12/2021 1529   MCH 27.2 11/12/2021 1529   MCH 28.5 10/15/2021 0438   MCHC 32.6 11/12/2021 1529   MCHC 32.2 10/15/2021 0438   RDW 15.9 (H) 11/12/2021 1529    LYMPHSABS 1.5 10/15/2021 0438   MONOABS 1.2 (H) 10/15/2021 0438   EOSABS 0.2 10/15/2021 0438   BASOSABS 0.0 10/15/2021 0438    ASSESSMENT AND PLAN: 1. Encounter to establish care Informed patient of how to apply for the orange card/cone discount card and encouraged him to do so.  He also has applied for [place] Medicaid.  2. MAlignant hypertension Not at goal.  Patient has financial issues with being able to afford his medications.  Sent to our pharmacy to see if he can get assistance Refill amlodipine, spironolactone and hydralazine. Advised to wait about 3 days and then try taking the carvedilol again.  If he develops rash or itching he should discontinue the carvedilol.  Hold off on clonidine since he did have some itching today when he took it. - amLODipine (NORVASC) 10 MG tablet; Take 1 tablet (10 mg total) by mouth daily.  Dispense: 30 tablet; Refill: 4 - spironolactone (ALDACTONE) 50 MG tablet; Take 1 tablet (50 mg total) by mouth daily.  Dispense: 30 tablet; Refill: 4 - hydrALAZINE (APRESOLINE) 50 MG tablet; Take 1 tablet (50 mg total) by mouth 3 (three) times daily.  Dispense: 90 tablet; Refill: 4  3. Stage 3b chronic kidney disease ([place]) Discussed the importance of good blood pressure control.  May have chronic kidney disease due to prolonged uncontrolled blood pressure. Advised to stop all NSAIDs Referred to nephrology - Basic Metabolic Panel  4. Anemia, chronic disease Iron studies consistent with anemia of chronic disease.  There is a question of microangiopathic hemolytic anemia from work-up in the hospital with haptoglobin being low, LDL elevated and retic count elevated.  Plan to refer to oncology and gastroenterology once he has Medicaid or the orange card/cone discount card. - CBC  5. Thrombocytopenia ([place]) See #4 above.  May be due to prior history of EtOH use.  6. Alcohol use disorder in remission Commended him and encouraged him to remain free of alcohol.  7. History  of migraine - SUMAtriptan (IMITREX) 25 MG tablet; 1 tab at start of headache.May repeat in 2 hours if headache persists or recurs. Max 2 tabs/24 hrs  Dispense: 10 tablet; Refill: 0 - amitriptyline (ELAVIL) 25 MG tablet; Take 1 tablet (25 mg total) by mouth at bedtime.  Dispense: 30 tablet; Refill: 4  8. Adverse effect of drug, initial encounter Stop clonidine for now.  57. Former smoker Commended him on quitting and and encouraged him to remain tobacco free  10. Gastritis without bleeding, unspecified chronicity, unspecified gastritis type Refill given on pantoprazole. Not the best given CKD but works better than H2 blocker We will need referral to gastroenterology once he has Medicaid or the orange card/cone discount card - pantoprazole (PROTONIX) 40 MG tablet; Take 1 tablet (40 mg total) by mouth daily.  Dispense: 30 tablet; Refill: 4  11. Vision disturbance Encouraged him to see an ophthalmologist as soon as possible.   Patient was given the opportunity to ask questions.  Patient verbalized understanding of the plan and was able to repeat key elements of the plan.   Orders Placed This Encounter  Procedures   CBC   Basic Metabolic Panel     Requested Prescriptions   Signed Prescriptions Disp Refills   pantoprazole (PROTONIX) 40 MG tablet 30 tablet 4    Sig: Take 1 tablet (40 mg total) by mouth daily.   SUMAtriptan (IMITREX) 25 MG tablet 10 tablet 0    Sig: 1 tab at start of headache.May repeat in 2 hours if headache persists or recurs. Max 2 tabs/24 hrs   amLODipine (NORVASC) 10 MG tablet 30 tablet 4    Sig: Take 1 tablet (10 mg total) by mouth daily.   methocarbamol (ROBAXIN) 500 MG tablet 30 tablet 0    Sig: Take 1 tablet (500 mg total) by mouth daily as needed for muscle spasms.   spironolactone (ALDACTONE) 50 MG tablet 30 tablet 4    Sig: Take 1 tablet (50 mg total) by mouth daily.   hydrALAZINE (APRESOLINE) 50 MG tablet 90 tablet 4    Sig: Take 1 tablet (50 mg total)  by mouth 3 (three) times daily.   cyanocobalamin 1000 MCG tablet 30 tablet 3    Sig: Take 1 tablet (1,000 mcg total) by mouth daily.   amitriptyline (ELAVIL) 25 MG tablet 30 tablet 4    Sig: Take 1 tablet (25 mg total) by mouth at bedtime.    Return in about 2 months (around 01/10/2022) for Appt with [facility] in 2 wks for BP check.  Karle Plumber, MD, FACP

## 2021-11-13 LAB — CBC
Hematocrit: 27.6 % — ABNORMAL LOW (ref 37.5–51.0)
Hemoglobin: 9 g/dL — ABNORMAL LOW (ref 13.0–17.7)
MCH: 27.2 pg (ref 26.6–33.0)
MCHC: 32.6 g/dL (ref 31.5–35.7)
MCV: 83 fL (ref 79–97)
Platelets: 287 10*3/uL (ref 150–450)
RBC: 3.31 x10E6/uL — ABNORMAL LOW (ref 4.14–5.80)
RDW: 15.9 % — ABNORMAL HIGH (ref 11.6–15.4)
WBC: 7.9 10*3/uL (ref 3.4–10.8)

## 2021-11-13 LAB — BASIC METABOLIC PANEL
BUN/Creatinine Ratio: 9 (ref 9–20)
BUN: 33 mg/dL — ABNORMAL HIGH (ref 6–24)
CO2: 20 mmol/L (ref 20–29)
Calcium: 9.3 mg/dL (ref 8.7–10.2)
Chloride: 101 mmol/L (ref 96–106)
Creatinine, Ser: 3.72 mg/dL — ABNORMAL HIGH (ref 0.76–1.27)
Glucose: 85 mg/dL (ref 70–99)
Potassium: 4.5 mmol/L (ref 3.5–5.2)
Sodium: 138 mmol/L (ref 134–144)
eGFR: 20 mL/min/{1.73_m2} — ABNORMAL LOW (ref >59)

## 2021-11-14 ENCOUNTER — Telehealth (INDEPENDENT_AMBULATORY_CARE_PROVIDER_SITE_OTHER): Payer: Self-pay

## 2021-11-14 DIAGNOSIS — Z8669 Personal history of other diseases of the nervous system and sense organs: Secondary | ICD-10-CM | POA: Insufficient documentation

## 2021-11-14 DIAGNOSIS — I10 Essential (primary) hypertension: Secondary | ICD-10-CM | POA: Insufficient documentation

## 2021-11-14 DIAGNOSIS — K2970 Gastritis, unspecified, without bleeding: Secondary | ICD-10-CM | POA: Insufficient documentation

## 2021-11-14 DIAGNOSIS — F1091 Alcohol use, unspecified, in remission: Secondary | ICD-10-CM | POA: Insufficient documentation

## 2021-11-14 DIAGNOSIS — Z87891 Personal history of nicotine dependence: Secondary | ICD-10-CM | POA: Insufficient documentation

## 2021-11-14 DIAGNOSIS — H539 Unspecified visual disturbance: Secondary | ICD-10-CM

## 2021-11-14 HISTORY — DX: Alcohol use, unspecified, in remission: F10.91

## 2021-11-14 HISTORY — DX: Personal history of other diseases of the nervous system and sense organs: Z86.69

## 2021-11-14 HISTORY — DX: Unspecified visual disturbance: H53.9

## 2021-11-14 NOTE — Progress Notes (Signed)
Let patient know that his kidney function is still not 100% and has worsened since hospitalization.  I have sent a message to our referral coordinator to try to get him in with the kidney specialist as soon as possible for follow-up.  He is still anemic.  Platelet blood cell count has normalized.  Please let me know once he has been approved for Medicaid or the orange card/cone discount card.

## 2021-11-14 NOTE — Telephone Encounter (Signed)
Contacted pt to go over lab results pt didn't answer was unable to lvm due to vm being full   Sent a CRM and forward labs to NT to give pt labs when they call back

## 2021-11-16 ENCOUNTER — Telehealth: Payer: Self-pay | Admitting: Internal Medicine

## 2021-11-16 NOTE — Telephone Encounter (Signed)
-----   Message from Ena Dawley sent at 11/16/2021  7:40 PM EST ----- Regarding: RE: Nephrology [place] date 12/24/2021 08:40 AM Seen By Gean Quint  ----- Message ----- From: Ladell Pier, MD Sent: 11/14/2021  11:58 AM EST To: Ena Dawley Subject: Nephrology                                     Please try to get him in with a nephrologist ASAP.  Seen by [facility] during recent hosp.  Kidney function has worsen.  He has applied for Medicaid and awaiting approval.

## 2021-11-23 ENCOUNTER — Telehealth: Payer: Self-pay | Admitting: Internal Medicine

## 2021-11-23 NOTE — Telephone Encounter (Signed)
-----   Message from Ena Dawley sent at 11/22/2021  9:15 PM EST ----- Regarding: RE: Nephrology Patient scheduled   [place] date 12/24/2021 08:40 AM Seen By Gean Quint ----- Message ----- From: Ladell Pier, MD Sent: 11/14/2021  11:58 AM EST To: Ena Dawley Subject: Nephrology                                     Please try to get him in with a nephrologist ASAP.  Seen by [facility] during recent hosp.  Kidney function has worsen.  He has applied for Medicaid and awaiting approval.

## 2021-11-30 ENCOUNTER — Emergency Department (HOSPITAL_COMMUNITY): Payer: Medicaid Other

## 2021-11-30 ENCOUNTER — Inpatient Hospital Stay (HOSPITAL_COMMUNITY): Payer: Medicaid Other

## 2021-11-30 ENCOUNTER — Inpatient Hospital Stay (HOSPITAL_COMMUNITY)
Admission: EM | Admit: 2021-11-30 | Discharge: 2021-12-07 | DRG: 304 | Disposition: A | Discharge status: Home / Self Care | Payer: Medicaid Other | Attending: Internal Medicine | Admitting: Internal Medicine

## 2021-11-30 ENCOUNTER — Encounter (HOSPITAL_COMMUNITY): Payer: Self-pay

## 2021-11-30 ENCOUNTER — Other Ambulatory Visit: Payer: Self-pay

## 2021-11-30 DIAGNOSIS — I130 Hypertensive heart and chronic kidney disease with heart failure and stage 1 through stage 4 chronic kidney disease, or unspecified chronic kidney disease: Secondary | ICD-10-CM | POA: Diagnosis present

## 2021-11-30 DIAGNOSIS — I161 Hypertensive emergency: Principal | ICD-10-CM | POA: Diagnosis present

## 2021-11-30 DIAGNOSIS — N179 Acute kidney failure, unspecified: Secondary | ICD-10-CM | POA: Diagnosis present

## 2021-11-30 DIAGNOSIS — Z79899 Other long term (current) drug therapy: Secondary | ICD-10-CM | POA: Diagnosis not present

## 2021-11-30 DIAGNOSIS — D509 Iron deficiency anemia, unspecified: Secondary | ICD-10-CM | POA: Diagnosis present

## 2021-11-30 DIAGNOSIS — F17210 Nicotine dependence, cigarettes, uncomplicated: Secondary | ICD-10-CM | POA: Diagnosis present

## 2021-11-30 DIAGNOSIS — Z803 Family history of malignant neoplasm of breast: Secondary | ICD-10-CM

## 2021-11-30 DIAGNOSIS — K219 Gastro-esophageal reflux disease without esophagitis: Secondary | ICD-10-CM | POA: Diagnosis present

## 2021-11-30 DIAGNOSIS — F1210 Cannabis abuse, uncomplicated: Secondary | ICD-10-CM | POA: Diagnosis present

## 2021-11-30 DIAGNOSIS — Z91199 Patient's noncompliance with other medical treatment and regimen due to unspecified reason: Secondary | ICD-10-CM

## 2021-11-30 DIAGNOSIS — D631 Anemia in chronic kidney disease: Secondary | ICD-10-CM | POA: Diagnosis present

## 2021-11-30 DIAGNOSIS — D72828 Other elevated white blood cell count: Secondary | ICD-10-CM | POA: Diagnosis present

## 2021-11-30 DIAGNOSIS — D649 Anemia, unspecified: Secondary | ICD-10-CM | POA: Diagnosis not present

## 2021-11-30 DIAGNOSIS — E871 Hypo-osmolality and hyponatremia: Secondary | ICD-10-CM | POA: Diagnosis present

## 2021-11-30 DIAGNOSIS — D6959 Other secondary thrombocytopenia: Secondary | ICD-10-CM | POA: Diagnosis present

## 2021-11-30 DIAGNOSIS — I6783 Posterior reversible encephalopathy syndrome: Secondary | ICD-10-CM | POA: Diagnosis present

## 2021-11-30 DIAGNOSIS — F1110 Opioid abuse, uncomplicated: Secondary | ICD-10-CM | POA: Diagnosis present

## 2021-11-30 DIAGNOSIS — L299 Pruritus, unspecified: Secondary | ICD-10-CM | POA: Diagnosis present

## 2021-11-30 DIAGNOSIS — Z8049 Family history of malignant neoplasm of other genital organs: Secondary | ICD-10-CM

## 2021-11-30 DIAGNOSIS — N184 Chronic kidney disease, stage 4 (severe): Secondary | ICD-10-CM | POA: Diagnosis present

## 2021-11-30 DIAGNOSIS — I5032 Chronic diastolic (congestive) heart failure: Secondary | ICD-10-CM | POA: Diagnosis present

## 2021-11-30 DIAGNOSIS — Z8249 Family history of ischemic heart disease and other diseases of the circulatory system: Secondary | ICD-10-CM

## 2021-11-30 DIAGNOSIS — E872 Acidosis, unspecified: Secondary | ICD-10-CM | POA: Diagnosis present

## 2021-11-30 DIAGNOSIS — I248 Other forms of acute ischemic heart disease: Secondary | ICD-10-CM | POA: Diagnosis present

## 2021-11-30 DIAGNOSIS — Z20822 Contact with and (suspected) exposure to covid-19: Secondary | ICD-10-CM | POA: Diagnosis present

## 2021-11-30 DIAGNOSIS — E876 Hypokalemia: Secondary | ICD-10-CM | POA: Diagnosis present

## 2021-11-30 DIAGNOSIS — E279 Disorder of adrenal gland, unspecified: Secondary | ICD-10-CM | POA: Diagnosis present

## 2021-11-30 DIAGNOSIS — I10 Essential (primary) hypertension: Secondary | ICD-10-CM | POA: Diagnosis not present

## 2021-11-30 DIAGNOSIS — E781 Pure hyperglyceridemia: Secondary | ICD-10-CM | POA: Diagnosis present

## 2021-11-30 DIAGNOSIS — K2970 Gastritis, unspecified, without bleeding: Secondary | ICD-10-CM

## 2021-11-30 DIAGNOSIS — N186 End stage renal disease: Secondary | ICD-10-CM | POA: Diagnosis not present

## 2021-11-30 LAB — BASIC METABOLIC PANEL
Anion gap: 16 — ABNORMAL HIGH (ref 5–15)
BUN: 50 mg/dL — ABNORMAL HIGH (ref 6–20)
CO2: 22 mmol/L (ref 22–32)
Calcium: 9.6 mg/dL (ref 8.9–10.3)
Chloride: 92 mmol/L — ABNORMAL LOW (ref 98–111)
Creatinine, Ser: 5.62 mg/dL — ABNORMAL HIGH (ref 0.61–1.24)
GFR, Estimated: 12 mL/min — ABNORMAL LOW (ref >60)
Glucose, Bld: 157 mg/dL — ABNORMAL HIGH (ref 70–99)
Potassium: 3.5 mmol/L (ref 3.5–5.1)
Sodium: 130 mmol/L — ABNORMAL LOW (ref 135–145)

## 2021-11-30 LAB — CBC WITH DIFFERENTIAL/PLATELET
Abs Immature Granulocytes: 0.1 10*3/uL — ABNORMAL HIGH (ref 0.00–0.07)
Basophils Absolute: 0 10*3/uL (ref 0.0–0.1)
Basophils Relative: 0 %
Eosinophils Absolute: 0 10*3/uL (ref 0.0–0.5)
Eosinophils Relative: 0 %
HCT: 34.2 % — ABNORMAL LOW (ref 39.0–52.0)
Hemoglobin: 11.3 g/dL — ABNORMAL LOW (ref 13.0–17.0)
Immature Granulocytes: 1 %
Lymphocytes Relative: 9 %
Lymphs Abs: 1.1 10*3/uL (ref 0.7–4.0)
MCH: 26.2 pg (ref 26.0–34.0)
MCHC: 33 g/dL (ref 30.0–36.0)
MCV: 79.4 fL — ABNORMAL LOW (ref 80.0–100.0)
Monocytes Absolute: 1 10*3/uL (ref 0.1–1.0)
Monocytes Relative: 8 %
Neutro Abs: 10.6 10*3/uL — ABNORMAL HIGH (ref 1.7–7.7)
Neutrophils Relative %: 82 %
Platelets: 87 10*3/uL — ABNORMAL LOW (ref 150–400)
RBC: 4.31 MIL/uL (ref 4.22–5.81)
RDW: 16.8 % — ABNORMAL HIGH (ref 11.5–15.5)
WBC: 12.8 10*3/uL — ABNORMAL HIGH (ref 4.0–10.5)
nRBC: 0 % (ref 0.0–0.2)

## 2021-11-30 LAB — ETHANOL: Alcohol, Ethyl (B): <10 mg/dL (ref <10)

## 2021-11-30 LAB — I-STAT CHEM 8, ED
BUN: 44 mg/dL — ABNORMAL HIGH (ref 6–20)
Calcium, Ion: 1.08 mmol/L — ABNORMAL LOW (ref 1.15–1.40)
Chloride: 97 mmol/L — ABNORMAL LOW (ref 98–111)
Creatinine, Ser: 6.5 mg/dL — ABNORMAL HIGH (ref 0.61–1.24)
Glucose, Bld: 147 mg/dL — ABNORMAL HIGH (ref 70–99)
HCT: 32 % — ABNORMAL LOW (ref 39.0–52.0)
Hemoglobin: 10.9 g/dL — ABNORMAL LOW (ref 13.0–17.0)
Potassium: 3.5 mmol/L (ref 3.5–5.1)
Sodium: 132 mmol/L — ABNORMAL LOW (ref 135–145)
TCO2: 23 mmol/L (ref 22–32)

## 2021-11-30 LAB — RAPID URINE DRUG SCREEN, HOSP PERFORMED
Amphetamines: NOT DETECTED
Barbiturates: NOT DETECTED
Benzodiazepines: NOT DETECTED
Cocaine: NOT DETECTED
Opiates: POSITIVE — AB
Tetrahydrocannabinol: POSITIVE — AB

## 2021-11-30 LAB — PROTEIN / CREATININE RATIO, URINE
Creatinine, Urine: 178.48 mg/dL
Total Protein, Urine: <6 mg/dL

## 2021-11-30 LAB — URINALYSIS, ROUTINE W REFLEX MICROSCOPIC
Bilirubin Urine: NEGATIVE
Glucose, UA: NEGATIVE mg/dL
Ketones, ur: NEGATIVE mg/dL
Leukocytes,Ua: NEGATIVE
Nitrite: NEGATIVE
Protein, ur: >=300 mg/dL — AB
Specific Gravity, Urine: 1.017 (ref 1.005–1.030)
pH: 5 (ref 5.0–8.0)

## 2021-11-30 LAB — TROPONIN I (HIGH SENSITIVITY)
Troponin I (High Sensitivity): 104 ng/L (ref <18)
Troponin I (High Sensitivity): 127 ng/L (ref <18)

## 2021-11-30 LAB — RESP PANEL BY RT-PCR (FLU A&B, COVID) ARPGX2
Influenza A by PCR: NEGATIVE
Influenza B by PCR: NEGATIVE
SARS Coronavirus 2 by RT PCR: NEGATIVE

## 2021-11-30 LAB — APTT: aPTT: 36 seconds (ref 24–36)

## 2021-11-30 LAB — PROTIME-INR
INR: 1.2 (ref 0.8–1.2)
Prothrombin Time: 15.4 seconds — ABNORMAL HIGH (ref 11.4–15.2)

## 2021-11-30 MED ORDER — ONDANSETRON HCL 4 MG/2ML IJ SOLN
4.0000 mg | Freq: Once | INTRAMUSCULAR | Status: AC | PRN
  Filled 2021-11-30: qty 2

## 2021-11-30 MED ORDER — LORAZEPAM 2 MG/ML IJ SOLN
1.0000 mg | Freq: Once | INTRAMUSCULAR | Status: AC
  Administered 2021-11-30: 1 mg via INTRAVENOUS
  Filled 2021-11-30: qty 1

## 2021-11-30 MED ORDER — ONDANSETRON HCL 4 MG/2ML IJ SOLN
4.0000 mg | Freq: Once | INTRAMUSCULAR | Status: DC
Start: 2021-11-30 — End: 2021-11-30

## 2021-11-30 MED ORDER — CHLORHEXIDINE GLUCONATE CLOTH 2 % EX PADS
6.0000 | MEDICATED_PAD | Freq: Every day | CUTANEOUS | Status: DC
  Administered 2021-11-30 – 2021-12-07 (×6): 6 via TOPICAL

## 2021-11-30 MED ORDER — LORAZEPAM 2 MG/ML IJ SOLN: 1.0000 mg | Freq: Every day | INTRAMUSCULAR | Status: DC | PRN

## 2021-11-30 MED ORDER — MORPHINE SULFATE (PF) 4 MG/ML IV SOLN
4.0000 mg | Freq: Once | INTRAVENOUS | Status: AC
  Administered 2021-11-30: 4 mg via INTRAVENOUS
  Filled 2021-11-30: qty 1

## 2021-11-30 MED ORDER — LABETALOL HCL 5 MG/ML IV SOLN
20.0000 mg | Freq: Once | INTRAVENOUS | Status: AC
  Administered 2021-11-30: 20 mg via INTRAVENOUS
  Filled 2021-11-30: qty 4

## 2021-11-30 MED ORDER — CLEVIDIPINE BUTYRATE 0.5 MG/ML IV EMUL
0.0000 mg/h | INTRAVENOUS | Status: DC
  Administered 2021-11-30 (×2): 12 mg/h via INTRAVENOUS
  Administered 2021-11-30: 2 mg/h via INTRAVENOUS
  Administered 2021-11-30: 12 mg/h via INTRAVENOUS
  Administered 2021-12-01: 8 mg/h via INTRAVENOUS
  Administered 2021-12-01: 10 mg/h via INTRAVENOUS
  Administered 2021-12-01: 12 mg/h via INTRAVENOUS
  Administered 2021-12-01 (×2): 10 mg/h via INTRAVENOUS
  Administered 2021-12-01: 12 mg/h via INTRAVENOUS
  Administered 2021-12-01: 10 mg/h via INTRAVENOUS
  Administered 2021-12-01: 12 mg/h via INTRAVENOUS
  Administered 2021-12-02 (×2): 14 mg/h via INTRAVENOUS
  Administered 2021-12-02: 18 mg/h via INTRAVENOUS
  Administered 2021-12-02 (×2): 16 mg/h via INTRAVENOUS
  Filled 2021-11-30 (×5): qty 50
  Filled 2021-11-30: qty 100
  Filled 2021-11-30 (×6): qty 50
  Filled 2021-11-30: qty 100
  Filled 2021-11-30 (×3): qty 50

## 2021-11-30 MED ORDER — HEPARIN SODIUM (PORCINE) 5000 UNIT/ML IJ SOLN
5000.0000 [IU] | Freq: Three times a day (TID) | INTRAMUSCULAR | Status: DC
  Administered 2021-12-01 – 2021-12-07 (×19): 5000 [IU] via SUBCUTANEOUS
  Filled 2021-11-30 (×19): qty 1

## 2021-11-30 MED ORDER — PANTOPRAZOLE SODIUM 40 MG PO TBEC
40.0000 mg | DELAYED_RELEASE_TABLET | Freq: Every day | ORAL | Status: DC
  Administered 2021-12-01 – 2021-12-07 (×7): 40 mg via ORAL
  Filled 2021-11-30 (×7): qty 1

## 2021-11-30 MED ORDER — ONDANSETRON HCL 4 MG/2ML IJ SOLN
INTRAMUSCULAR | Status: AC
  Administered 2021-11-30: 4 mg via INTRAVENOUS
  Filled 2021-11-30: qty 2

## 2021-11-30 NOTE — Progress Notes (Signed)
eLink Physician-Brief Progress Note ?Patient Name: Allen L Sisneros ?DOB: 09/18/1976 ?MRN: [id number] ? ? ?Date of Service ? 11/30/2021  ?HPI/Events of Note ? 46 yo AAM with PMHx of HTN, recent admit for HTN urgency, tobacco use, former EtOH use, migranes  presenting with complaints of worsening HA and visual changes. Admitted for possible PRES, had MRI done . Also found to have AKI.   ?eICU Interventions ? - seen by ground team admitted  ?-On arrival BP 223/156 (MAP 175); given labetolol and started on cleviprex  ?- goal MAP 120-130 for tonight  - currently on camera alert oriented, non focal, MAP 137  ?-MRI looks like old prior episode of PRES  ?-continue current care, avoid over-rapid BP reductions  ?-nephrology will f/u in AM   ? ? ? ?  ? ?Hewitt Shorts Twanna Resh ?11/30/2021, 11:59 PM ?

## 2021-11-30 NOTE — Progress Notes (Signed)
Called to get report on patient, nurse is currently busy.  Left number for nurse to call back and give report.   ?

## 2021-11-30 NOTE — ED Notes (Signed)
Accompanied pt to MRI. ?

## 2021-11-30 NOTE — ED Provider Triage Note (Signed)
Emergency Medicine Provider Triage Evaluation Note ? ?Roy Randolph , a 46 y.o. male  was evaluated in triage.  Pt complains of HA, blurred vision, CP, SOB over last 2 days. Recent admit in Jan for hTN urgency. Compliant with BP meds at home however did not take today. Bl blurred vision, no diplopia, unilateral weakness. Intermittent central CP. Non exertional. No LE swelling, PND, orthopnea. ? ?Review of Systems  ?Positive: HA, blurred vision, CP, SOB, emesis ?Negative: Abd pain ? ?Physical Exam  ?There were no vitals taken for this visit. ?Gen:   Awake, no distress   ?Resp:  Normal effort  ?MSK:   Moves extremities without difficulty, No LE edema ?Neuro:  Cn 2-12 intact. Ambulatory, equal hand grip ?Other:   ? ?Medical Decision Making  ?Medically screening exam initiated at 5:11 PM.  Appropriate orders placed.  Roy Randolph was informed that the remainder of the evaluation will be completed by another provider, this initial triage assessment does not replace that evaluation, and the importance of remaining in the ED until their evaluation is complete. ? ?HA, blurred vision, CP ? ?Not a code stroke, No LVO criteria given sx x 2 days ? ?BP 230/161>> Concern for HTN emergency/ urgency ? ?Nursing aware patient needs room in back ?  ?Lacey Dotson A, PA-C ?11/30/21 1714 ? ?

## 2021-11-30 NOTE — H&P (Signed)
NAME:  Roy Randolph, MRN:  [id number], DOB:  12-30-1975, LOS: 0 ADMISSION DATE:  11/30/2021, CONSULTATION DATE:  11/30/2021  REFERRING MD:  Milton Ferguson MD, CHIEF COMPLAINT:  HA, visual changes    History of Present Illness:  Pt is a 46 yo AAM with PMHx of HTN, recent admit for HTN urgency (09/2021), tobacco use, former EtOH use, migranes  presenting with complaints of worsening HA and visual changes. Pt reports that symptoms were similar in January but had improved. Over last 2 weeks have gradually worsened prompting return to ED today.  Post discharge patient followed up with PCP and was referred to nephrologist due to worsening renal function but has not had appointment yet. He reports compliance with all meds - developed diffuse pruritis which he thought was medication related. The last 2 weeks he reports HA worsening, un relieved by migrane RX and worsening visual changes. He is unable to make out shapes or colors.  He notes he has had long standing HTN since late teens - early 19s; was previously well controlled on lisinopril however stopped taking it in 2018 when he relocated and did not have PCP.   Former Ecologist use (sober 3+ years), 30+ PYH tobacco use - quit 09/2021, + marijuana use, no other illicit substances   Pertinent  Medical History  HTN  Hx of HTN urgency  Former Tobacco use (quit 09/2021)  Former EtOH use   Significant Hospital Events: Including procedures, antibiotic start and stop dates in addition to other pertinent events     Interim History / Subjective:    Objective   Blood pressure (!) 170/118, pulse 90, temperature 98.1 F (36.7 C), temperature source Oral, resp. rate (!) 24, SpO2 99 %.        Intake/Output Summary (Last 24 hours) at 11/30/2021 2102 Last data filed at 11/30/2021 1902 Gross per 24 hour  Intake 10.11 ml  Output --  Net 10.11 ml   There were no vitals filed for this visit.  Examination: Gen: well appearing, no acute distress HENT: NCAT, OP  clear, neck supple without masses Eyes: PERRL, EOMi PULM: CTA B CV: RRR, no mgr, no JVD GI: BS+, soft, nontender, no hsm Derm: no rash, some excoriation to neck/upper back  MSK: normal bulk and tone Neuro: A&Ox4, CN III-XII intact, strength 5/5 in all 4 extremities, no peripheral vision, blurred central vision, unable to identify shapes or colors. 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  - send LDH/Hapto/blood smear/LFTs   AKI on CKD4  - in setting of uncontrolled HTN  - UA w/ proteinuria, hematuria  - no emergent HD indication, Nephro consult in AM  - send UPCR, CPK, mag, phos   High anion gap metabolic acidosis  - likely secondary to AKI, uremia  - check LA   Troponinemia  -  likely demand secondary to HTN & in setting of renal failure  - asymtpomatic - no CP/sob  - trend  - repeat EKG in AM   Leukocytosis  - suspect stress response over infectious etiology  - no s/s, of infxn, afebrile  - repeat CBC   Grade 2 diastolic CHF  - Echo 3/61 w/ diastolic failure, mild LVH, EF 55%  - currently appears compensated  - will benefit from diuresis long term   Pruritis  - likely secondary to renal failure  - check phos   Headache - suspect related to PRES > migraine  - unchanged with morphine in ED  - symptom control as able - lights/ice/etc... - should improve with BP improvement    GERD  - home PPI   Marijuana Use  Former Tobacco use  - quit 09/2021    Best Practice (right click and "Reselect all SmartList Selections" daily)   Diet/type: Regular consistency (see orders) DVT prophylaxis: prophylactic heparin  GI prophylaxis: N/A Lines: N/A Foley:  N/A Code Status:  full code Last date of multidisciplinary goals of care discussion []   Labs   CBC: Recent Labs  Lab 11/30/21 1707 11/30/21 1842  WBC 12.8*  --   NEUTROABS 10.6*  --   HGB 11.3* 10.9*  HCT 34.2* 32.0*  MCV 79.4*  --   PLT 87*  --     Basic Metabolic Panel: Recent Labs  Lab 11/30/21 1707 11/30/21 1842  NA 130* 132*  K 3.5 3.5  CL 92* 97*  CO2 22  --   GLUCOSE 157* 147*  BUN 50* 44*  CREATININE 5.62* 6.50*  CALCIUM 9.6  --    GFR: CrCl cannot be calculated (Unknown ideal weight.). Recent Labs  Lab 11/30/21 1707  WBC 12.8*    Liver Function Tests: No results for input(s): AST, ALT, ALKPHOS, BILITOT, PROT, ALBUMIN in the last 168 hours. No results for input(s): LIPASE, AMYLASE in the last 168 hours. No results for input(s): AMMONIA in the last 168 hours.  ABG    Component Value Date/Time   TCO2 23 11/30/2021 1842     Coagulation Profile: Recent Labs  Lab 11/30/21 1748  INR 1.2    Cardiac Enzymes: No results for input(s): CKTOTAL, CKMB, CKMBINDEX, TROPONINI in the last 168 hours.  HbA1C: Hgb A1c MFr Bld  Date/Time Value Ref Range Status  10/10/2021 05:58 PM 4.7 (L) 4.8 - 5.6 % Final    Comment:    (NOTE) Pre diabetes:          5.7%-6.4%  Diabetes:              >6.4%  Glycemic control for   <7.0% adults with diabetes     CBG: No results for input(s): GLUCAP in the last 168 hours.  Review of Systems:   Bolds are positive  Constitutional: weight loss, gain, night sweats, Fevers, chills, fatigue .  HEENT: headaches, Sore throat, sneezing, nasal congestion, post nasal drip, Difficulty swallowing, Tooth/dental problems,  ear ache CV:  chest pain,  ,Orthopnea, PND, swelling in lower extremities, dizziness, palpitations, syncope.  GI  heartburn, indigestion, abdominal pain, nausea, vomiting, diarrhea, change in bowel habits, loss of appetite, bloody stools.  Resp: cough,  hemoptysis, dyspnea Skin: rash or itching or icterus GU: dysuria, change in color of urine, urgency or frequency. flank pain, hematuria  MS: joint pain or swelling. decreased range of motion  Psych: change in mood or affect. depression or anxiety.  Neuro: difficulty with speech, weakness, numbness,  ataxia, visual complaints visual changes, denies bladder and bowel incontinence   Past Medical History:  He,  has a past medical history of Alcohol use, Hypertension, Noncompliance, and Smoking.   Surgical History:   Past Surgical History:  Procedure Laterality Date   THORACOTOMY Right      Social History:   reports that he has been smoking cigarettes. He started smoking about 25 years ago. He has been smoking an average of .3 packs per day. He does not have any smokeless tobacco history on file. He reports that he does not currently use alcohol after a past usage of about 12.0 standard drinks per week. He reports current drug use. Drug: Marijuana.   Family History:  His family history includes Breast cancer in his sister; Cancer in his maternal grandmother; Cervical cancer in his mother; Heart disease in his maternal grandmother; Hypertension in his mother; Sudden Cardiac Death (age of onset: 42) in his brother.   Allergies Allergies  Allergen Reactions   Fruit Extracts     In general all fruits and vegetable. Can eat a can of fruit.    Tomato (Diagnostic) Swelling   Wild Lettuce [Lactuca Virosa] Swelling     Home Medications  Prior to Admission medications   Medication Sig Start Date End Date Taking? Authorizing Provider  acetaminophen (TYLENOL) 500 MG tablet Take 1,000 mg by mouth every 6 (six) hours as needed for mild pain.    [provider]   amitriptyline (ELAVIL) 25 MG tablet Take 1 tablet (25 mg total) by mouth at bedtime. 11/12/21   Ladell Pier, MD  amLODipine (NORVASC) 10 MG tablet Take 1 tablet (10 mg total) by mouth daily. 11/12/21   Ladell Pier, MD  B Complex-C (B-COMPLEX WITH VITAMIN C) tablet Take 1 tablet by mouth daily. Patient not taking: Reported on 11/12/2021 10/16/21   Lavina Hamman, MD  butalbital-acetaminophen-caffeine (FIORICET) [id number] MG tablet Take 1 tablet by mouth every 4 (four) hours as needed for headache or migraine. 10/15/21   Lavina Hamman, MD  carvedilol (COREG) 25 MG tablet Take 1 tablet (25 mg total) by mouth 2 (two) times daily with a meal. 10/15/21   Lavina Hamman, MD  cyanocobalamin 1000 MCG tablet Take 1 tablet (1,000 mcg total) by mouth daily. 11/12/21   Ladell Pier, MD  hydrALAZINE (APRESOLINE) 50 MG tablet Take 1 tablet (50 mg total) by mouth 3 (three) times daily. 11/12/21   Ladell Pier, MD  methocarbamol (ROBAXIN) 500 MG tablet Take 1 tablet (500 mg total) by mouth daily as needed for muscle spasms. 11/12/21   Ladell Pier, MD  pantoprazole (PROTONIX) 40 MG tablet Take 1 tablet (40 mg total) by mouth daily. 11/12/21   Ladell Pier, MD  spironolactone (ALDACTONE) 50 MG tablet Take 1 tablet (50 mg total) by mouth daily. 11/12/21   Ladell Pier, MD  SUMAtriptan (IMITREX) 25 MG tablet 1 tab at start of headache.May repeat in 2 hours if headache persists or recurs. Max 2 tabs/24 hrs 11/12/21   Ladell Pier, MD     Critical care time: 55 minutes    Critical care time was exclusive of separately billable procedures and treating other patients.  Critical care was necessary to treat or prevent imminent or life-threatening deterioration.  Critical care was time spent personally by me on the following activities: development of treatment plan with patient and/or surrogate as well as nursing, discussions with consultants, evaluation of patient's response to  treatment, examination of patient, obtaining history from patient or surrogate, ordering and performing treatments and interventions, ordering and review of laboratory studies, ordering and review of radiographic studies, pulse oximetry and re-evaluation of patient's condition.

## 2021-11-30 NOTE — ED Provider Notes (Signed)
[facility] DEPT Provider Note   CSN: [id number] Arrival date & time: 11/30/21  1655     History  Chief Complaint  Patient presents with   Hypertension    Roy Randolph is a 46 y.o. male.  Patient complains of blurred vision and headache for 2 days.  He was admitted 2 months ago with hypertensive crisis.  Patient has a history of poorly controlled blood pressure  The history is provided by the patient and medical records. No language interpreter was used.  Hypertension This is a recurrent problem. The current episode started more than 2 days ago. The problem occurs constantly. The problem has not changed since onset.Pertinent negatives include no chest pain, no abdominal pain and no headaches. Nothing aggravates the symptoms. Nothing relieves the symptoms. He has tried nothing for the symptoms.      Home Medications Prior to Admission medications   Medication Sig Start Date End Date Taking? Authorizing Provider  acetaminophen (TYLENOL) 500 MG tablet Take 1,000 mg by mouth every 6 (six) hours as needed for mild pain.    [provider]  amitriptyline (ELAVIL) 25 MG tablet Take 1 tablet (25 mg total) by mouth at bedtime. 11/12/21   Ladell Pier, MD  amLODipine (NORVASC) 10 MG tablet Take 1 tablet (10 mg total) by mouth daily. 11/12/21   Ladell Pier, MD  B Complex-C (B-COMPLEX WITH VITAMIN C) tablet Take 1 tablet by mouth daily. Patient not taking: Reported on 11/12/2021 10/16/21   Lavina Hamman, MD  butalbital-acetaminophen-caffeine (FIORICET) [id number] MG tablet Take 1 tablet by mouth every 4 (four) hours as needed for headache or migraine. 10/15/21   Lavina Hamman, MD  carvedilol (COREG) 25 MG tablet Take 1 tablet (25 mg total) by mouth 2 (two) times daily with a meal. 10/15/21   Lavina Hamman, MD  cyanocobalamin 1000 MCG tablet Take 1 tablet (1,000 mcg total) by mouth daily. 11/12/21   Ladell Pier, MD  hydrALAZINE  (APRESOLINE) 50 MG tablet Take 1 tablet (50 mg total) by mouth 3 (three) times daily. 11/12/21   Ladell Pier, MD  methocarbamol (ROBAXIN) 500 MG tablet Take 1 tablet (500 mg total) by mouth daily as needed for muscle spasms. 11/12/21   Ladell Pier, MD  pantoprazole (PROTONIX) 40 MG tablet Take 1 tablet (40 mg total) by mouth daily. 11/12/21   Ladell Pier, MD  spironolactone (ALDACTONE) 50 MG tablet Take 1 tablet (50 mg total) by mouth daily. 11/12/21   Ladell Pier, MD  SUMAtriptan (IMITREX) 25 MG tablet 1 tab at start of headache.May repeat in 2 hours if headache persists or recurs. Max 2 tabs/24 hrs 11/12/21   Ladell Pier, MD      Allergies    Fruit extracts, Tomato (diagnostic), and Wild lettuce [lactuca virosa]    Review of Systems   Review of Systems  Constitutional:  Negative for appetite change and fatigue.  HENT:  Negative for congestion, ear discharge and sinus pressure.        Blurred vision and headache  Eyes:  Negative for discharge.  Respiratory:  Negative for cough.   Cardiovascular:  Negative for chest pain.  Gastrointestinal:  Negative for abdominal pain and diarrhea.  Genitourinary:  Negative for frequency and hematuria.  Musculoskeletal:  Negative for back pain.  Skin:  Negative for rash.  Neurological:  Negative for seizures and headaches.  Psychiatric/Behavioral:  Negative for hallucinations.    Physical Exam Updated  Vital Signs BP (!) 143/100    Pulse 92    Temp 98.1 F (36.7 C) (Oral)    Resp (!) 24    SpO2 99%  Physical Exam Vitals and nursing note reviewed.  Constitutional:      Appearance: He is well-developed.  HENT:     Head: Normocephalic.     Nose: Nose normal.  Eyes:     General: No scleral icterus.    Comments: Patient can only see blurry objects  Neck:     Thyroid: No thyromegaly.  Cardiovascular:     Rate and Rhythm: Normal rate and regular rhythm.     Heart sounds: No murmur heard.   No friction rub. No gallop.   Pulmonary:     Breath sounds: No stridor. No wheezing or rales.  Chest:     Chest wall: No tenderness.  Abdominal:     General: There is no distension.     Tenderness: There is no abdominal tenderness. There is no rebound.  Musculoskeletal:        General: Normal range of motion.     Cervical back: Neck supple.  Lymphadenopathy:     Cervical: No cervical adenopathy.  Skin:    Findings: No erythema or rash.  Neurological:     Mental Status: He is alert and oriented to person, place, and time.     Motor: No abnormal muscle tone.     Coordination: Coordination normal.  Psychiatric:        Behavior: Behavior normal.    ED Results / Procedures / Treatments   Labs (all labs ordered are listed, but only abnormal results are displayed) Labs Reviewed  CBC WITH DIFFERENTIAL/PLATELET - Abnormal; Notable for the following components:      Result Value   WBC 12.8 (*)    Hemoglobin 11.3 (*)    HCT 34.2 (*)    MCV 79.4 (*)    RDW 16.8 (*)    Platelets 87 (*)    Neutro Abs 10.6 (*)    Abs Immature Granulocytes 0.10 (*)    All other components within normal limits  BASIC METABOLIC PANEL - Abnormal; Notable for the following components:   Sodium 130 (*)    Chloride 92 (*)    Glucose, Bld 157 (*)    BUN 50 (*)    Creatinine, Ser 5.62 (*)    GFR, Estimated 12 (*)    Anion gap 16 (*)    All other components within normal limits  PROTIME-INR - Abnormal; Notable for the following components:   Prothrombin Time 15.4 (*)    All other components within normal limits  I-STAT CHEM 8, ED - Abnormal; Notable for the following components:   Sodium 132 (*)    Chloride 97 (*)    BUN 44 (*)    Creatinine, Ser 6.50 (*)    Glucose, Bld 147 (*)    Calcium, Ion 1.08 (*)    Hemoglobin 10.9 (*)    HCT 32.0 (*)    All other components within normal limits  TROPONIN I (HIGH SENSITIVITY) - Abnormal; Notable for the following components:   Troponin I (High Sensitivity) 104 (*)    All other  components within normal limits  RESP PANEL BY RT-PCR (FLU A&B, COVID) ARPGX2  ETHANOL  APTT  RAPID URINE DRUG SCREEN, HOSP PERFORMED  URINALYSIS, ROUTINE W REFLEX MICROSCOPIC  PROTEIN / CREATININE RATIO, URINE  TROPONIN I (HIGH SENSITIVITY)    EKG None  Radiology DG Chest 2  View  Result Date: 11/30/2021 CLINICAL DATA:  Provided history: Chest pain. EXAM: CHEST - 2 VIEW COMPARISON:  Chest radiographs 10/10/2021. FINDINGS: Heart size within normal limits. Focus of linear atelectasis versus scarring within the right lung base. No appreciable airspace consolidation. No evidence of pleural effusion or pneumothorax. No acute bony abnormality identified. IMPRESSION: Focus of linear atelectasis versus scarring within the lateral right lung base. Otherwise, no evidence of acute cardiopulmonary abnormality. Electronically Signed   By: Kellie Simmering D.O.   On: 11/30/2021 17:30   CT HEAD WO CONTRAST (5MM)  Result Date: 11/30/2021 CLINICAL DATA:  Provided history: Diplopia, headache, blurred vision, hypertension. EXAM: CT HEAD WITHOUT CONTRAST TECHNIQUE: Contiguous axial images were obtained from the base of the skull through the vertex without intravenous contrast. RADIATION DOSE REDUCTION: This exam was performed according to the departmental dose-optimization program which includes automated exposure control, adjustment of the mA and/or kV according to patient size and/or use of iterative reconstruction technique. COMPARISON:  Head CT 10/10/2021. FINDINGS: Brain: Cerebral volume is normal. There is symmetric abnormal cortical/subcortical hypodensity within the bilateral occipital lobes. Additionally, there is apparent mild abnormal hypodensity within the right cerebellar hemisphere (for instance as seen on series 2, image 6). There is no acute intracranial hemorrhage. No extra-axial fluid collection. No evidence of an intracranial mass. No midline shift. Vascular: No hyperdense vessel. Skull: Normal.  Negative for fracture or focal lesion. Sinuses/Orbits: Visualized orbits show no acute finding. No significant paranasal sinus disease at the imaged levels. These results were called by telephone at the time of interpretation on 11/30/2021 at 5:40 pm to provider Dr. Roderic Palau, who verbally acknowledged these results. IMPRESSION: Symmetric abnormal cortical/subcortical hypodensity within the bilateral occipital lobes. Additionally, there is apparent mild abnormal hypodensity within the right cerebellar hemisphere. 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 A brain MRI is recommended for further evaluation and to exclude superimposed acute infarcts at these sites. Electronically Signed   By: Kellie Simmering D.O.   On: 11/30/2021 17:42    Procedures Procedures    Medications Ordered in ED Medications  labetalol (NORMODYNE) injection 20 mg (20 mg Intravenous Given 11/30/21 1814)    And  clevidipine (CLEVIPREX) infusion 0.5 mg/mL (14 mg/hr Intravenous Infusion Verify 11/30/21 1902)  ondansetron (ZOFRAN) injection 4 mg (4 mg Intravenous Given 11/30/21 1833)  morphine (PF) 4 MG/ML injection 4 mg (4 mg Intravenous Given 11/30/21 1851)    ED Course/ Medical Decision Making/ A&P    CRITICAL CARE Performed by: Milton Ferguson Total critical care time: 50 minutes Critical care time was exclusive of separately billable procedures and treating other patients. Critical care was necessary to treat or prevent imminent or life-threatening deterioration. Critical care was time spent personally by me on the following activities: development of treatment plan with patient and/or surrogate as well as nursing, discussions with consultants, evaluation of patient's response to treatment, examination of patient, obtaining history from patient or surrogate, ordering and performing treatments and interventions,  ordering and review of laboratory studies, ordering and review of radiographic studies, pulse oximetry and re-evaluation of patient's condition.    Patient with hypertensive emergency.  CT scan suggestive of press syndrome.  I spoke with neurology and they recommended getting an MRI and having critical care admit to [facility].  Medical Decision Making Amount and/or Complexity of Data Reviewed Labs: ordered. Radiology: ordered.  Risk Prescription drug management. Decision regarding hospitalization.   This patient presents to the ED for concern of headache, this involves an extensive number of treatment options, and is a complaint that carries with it a high risk of complications and morbidity.  The differential diagnosis includes hypertensive urgency, stroke   Co morbidities that complicate the patient evaluation  Poorly controlled blood pressure   Additional history obtained:  Additional history obtained from family External records from outside source obtained and reviewed including hospital record   Lab Tests:  I Ordered, and personally interpreted labs.  The pertinent results include: CBC with white count elevated 15.5 and hemoglobin low at 10.9   Imaging Studies ordered:  I ordered imaging studies including CT head and MRI of the head I independently visualized and interpreted imaging which showed MRI shows no acute problems.  But probably old press seen.  CT scan shows what looks like press I agree with the radiologist interpretation   Cardiac Monitoring:  The patient was maintained on a cardiac monitor.  I personally viewed and interpreted the cardiac monitored which showed an underlying rhythm of: Normal sinus rhythm   Medicines ordered and prescription drug management:  I ordered medication including Cleviprex for blood pressure Reevaluation of the patient after these medicines showed that the patient improved I have reviewed  the patients home medicines and have made adjustments as needed   Test Considered:  None   Critical Interventions:  IV Cleviprex and critical care consult   Consultations Obtained:  I requested consultation with the critical care and neurology,  and discussed lab and imaging findings as well as pertinent plan - they recommend: Critical care will admit to [facility] with neurology consult   Problem List / ED Course:  Hypertensive emergency    Social Determinants of Health:  None      hypertensive emergency with press syndrome.  Patient is placed on Cleviprex to control his hypertensive crisis.  Critical care will admit the patient to [facility] and neurology has been consulted and will see him at [facility]        Final Clinical Impression(s) / ED Diagnoses Final diagnoses:  Hypertensive emergency    Rx / DC Orders ED Discharge Orders     None         Milton Ferguson, MD 12/01/21 1015

## 2021-11-30 NOTE — ED Notes (Signed)
[facility] arrived to transport pt to [facility] 4N 30C-1. ?

## 2021-11-30 NOTE — ED Notes (Signed)
Alroy Dust, RN 4N 30C-1, given an update on pt's status and arrival. ?

## 2021-11-30 NOTE — ED Triage Notes (Signed)
Pt arrived via POV, c/o HTN, vision changes, migraine.  ?

## 2021-12-01 DIAGNOSIS — I10 Essential (primary) hypertension: Secondary | ICD-10-CM | POA: Diagnosis not present

## 2021-12-01 DIAGNOSIS — I6783 Posterior reversible encephalopathy syndrome: Secondary | ICD-10-CM

## 2021-12-01 DIAGNOSIS — I161 Hypertensive emergency: Principal | ICD-10-CM

## 2021-12-01 LAB — COMPREHENSIVE METABOLIC PANEL
ALT: 13 U/L (ref 0–44)
AST: 27 U/L (ref 15–41)
Albumin: 3.7 g/dL (ref 3.5–5.0)
Alkaline Phosphatase: 104 U/L (ref 38–126)
Anion gap: 16 — ABNORMAL HIGH (ref 5–15)
BUN: 53 mg/dL — ABNORMAL HIGH (ref 6–20)
CO2: 22 mmol/L (ref 22–32)
Calcium: 9.5 mg/dL (ref 8.9–10.3)
Chloride: 91 mmol/L — ABNORMAL LOW (ref 98–111)
Creatinine, Ser: 6.33 mg/dL — ABNORMAL HIGH (ref 0.61–1.24)
GFR, Estimated: 10 mL/min — ABNORMAL LOW (ref >60)
Glucose, Bld: 162 mg/dL — ABNORMAL HIGH (ref 70–99)
Potassium: 3 mmol/L — ABNORMAL LOW (ref 3.5–5.1)
Sodium: 129 mmol/L — ABNORMAL LOW (ref 135–145)
Total Bilirubin: 0.8 mg/dL (ref 0.3–1.2)
Total Protein: 9.1 g/dL — ABNORMAL HIGH (ref 6.5–8.1)

## 2021-12-01 LAB — BASIC METABOLIC PANEL
Anion gap: 17 — ABNORMAL HIGH (ref 5–15)
BUN: 55 mg/dL — ABNORMAL HIGH (ref 6–20)
CO2: 23 mmol/L (ref 22–32)
Calcium: 9.3 mg/dL (ref 8.9–10.3)
Chloride: 89 mmol/L — ABNORMAL LOW (ref 98–111)
Creatinine, Ser: 6.19 mg/dL — ABNORMAL HIGH (ref 0.61–1.24)
GFR, Estimated: 11 mL/min — ABNORMAL LOW (ref >60)
Glucose, Bld: 177 mg/dL — ABNORMAL HIGH (ref 70–99)
Potassium: 3.1 mmol/L — ABNORMAL LOW (ref 3.5–5.1)
Sodium: 129 mmol/L — ABNORMAL LOW (ref 135–145)

## 2021-12-01 LAB — CBC
HCT: 32.2 % — ABNORMAL LOW (ref 39.0–52.0)
HCT: 31 % — ABNORMAL LOW (ref 39.0–52.0)
Hemoglobin: 10.9 g/dL — ABNORMAL LOW (ref 13.0–17.0)
Hemoglobin: 10.8 g/dL — ABNORMAL LOW (ref 13.0–17.0)
MCH: 26.5 pg (ref 26.0–34.0)
MCH: 27.1 pg (ref 26.0–34.0)
MCHC: 33.9 g/dL (ref 30.0–36.0)
MCHC: 34.8 g/dL (ref 30.0–36.0)
MCV: 78.3 fL — ABNORMAL LOW (ref 80.0–100.0)
MCV: 77.7 fL — ABNORMAL LOW (ref 80.0–100.0)
Platelets: 107 10*3/uL — ABNORMAL LOW (ref 150–400)
Platelets: 91 10*3/uL — ABNORMAL LOW (ref 150–400)
RBC: 4.11 MIL/uL — ABNORMAL LOW (ref 4.22–5.81)
RBC: 3.99 MIL/uL — ABNORMAL LOW (ref 4.22–5.81)
RDW: 17 % — ABNORMAL HIGH (ref 11.5–15.5)
RDW: 17 % — ABNORMAL HIGH (ref 11.5–15.5)
WBC: 15.5 10*3/uL — ABNORMAL HIGH (ref 4.0–10.5)
WBC: 14 10*3/uL — ABNORMAL HIGH (ref 4.0–10.5)
nRBC: 0 % (ref 0.0–0.2)
nRBC: 0 % (ref 0.0–0.2)

## 2021-12-01 LAB — MRSA NEXT GEN BY PCR, NASAL: MRSA by PCR Next Gen: NOT DETECTED

## 2021-12-01 LAB — LACTIC ACID, PLASMA: Lactic Acid, Venous: 2 mmol/L (ref 0.5–1.9)

## 2021-12-01 LAB — MAGNESIUM
Magnesium: 2.2 mg/dL (ref 1.7–2.4)
Magnesium: 2.3 mg/dL (ref 1.7–2.4)

## 2021-12-01 LAB — PHOSPHORUS
Phosphorus: 4.8 mg/dL — ABNORMAL HIGH (ref 2.5–4.6)
Phosphorus: 5.8 mg/dL — ABNORMAL HIGH (ref 2.5–4.6)

## 2021-12-01 LAB — LACTATE DEHYDROGENASE: LDH: 624 U/L — ABNORMAL HIGH (ref 98–192)

## 2021-12-01 LAB — CK: Total CK: 50 U/L (ref 49–397)

## 2021-12-01 MED ORDER — ASPIRIN EC 81 MG PO TBEC
81.0000 mg | DELAYED_RELEASE_TABLET | Freq: Every day | ORAL | Status: DC
  Administered 2021-12-01 – 2021-12-07 (×7): 81 mg via ORAL
  Filled 2021-12-01 (×7): qty 1

## 2021-12-01 MED ORDER — DIPHENHYDRAMINE HCL 25 MG PO CAPS
25.0000 mg | ORAL_CAPSULE | Freq: Four times a day (QID) | ORAL | Status: DC | PRN
  Administered 2021-12-01 (×2): 50 mg via ORAL
  Administered 2021-12-02: 25 mg via ORAL
  Administered 2021-12-02 – 2021-12-03 (×2): 50 mg via ORAL
  Administered 2021-12-04 – 2021-12-06 (×3): 25 mg via ORAL
  Administered 2021-12-06: 21:00:00 50 mg via ORAL
  Filled 2021-12-01 (×2): qty 1
  Filled 2021-12-01: qty 2
  Filled 2021-12-01: qty 1
  Filled 2021-12-01 (×3): qty 2
  Filled 2021-12-01: qty 1

## 2021-12-01 MED ORDER — AMLODIPINE BESYLATE 10 MG PO TABS
10.0000 mg | ORAL_TABLET | Freq: Every day | ORAL | Status: DC
  Administered 2021-12-01 – 2021-12-07 (×7): 10 mg via ORAL
  Filled 2021-12-01 (×7): qty 1

## 2021-12-01 MED ORDER — RENA-VITE PO TABS
1.0000 | ORAL_TABLET | Freq: Every day | ORAL | Status: DC
Start: 2021-12-01 — End: 2021-12-07
  Administered 2021-12-01 – 2021-12-06 (×6): 1 via ORAL
  Filled 2021-12-01 (×7): qty 1

## 2021-12-01 NOTE — Progress Notes (Signed)
? ?NAME:  Roy Randolph, MRN:  [id number], DOB:  12-07-1975, LOS: 1 ?ADMISSION DATE:  11/30/2021, CONSULTATION DATE:  3/4 ?REFERRING MD:  Roderic Palau, CHIEF COMPLAINT:  headache, visual changes  ? ?History of Present Illness:  ?46 y/o male with a history of difficult to control hypertension presented with visual changes and headache on 3/3, diagnosed with PRES syndrome.   ? ?Pertinent  Medical History  ?HTN  ?Hx of HTN urgency  ?Former Tobacco use (quit 09/2021)  ?Former EtOH use  ? ?Significant Hospital Events: ?Including procedures, antibiotic start and stop dates in addition to other pertinent events   ?3/3 admission to ICU started on cleviprex ?3/4 itching persists, visual changes have improved ? ?Interim History / Subjective:  ? ?itching persists, visual changes have improved; he thinks itching is from hydralazine ? ?Objective   ?Blood pressure (!) 177/106, pulse 88, temperature 98.8 ?F (37.1 ?C), temperature source Oral, resp. rate 19, height 5\' 9"  (1.753 m), weight 69.9 kg, SpO2 91 %. ?   ?   ? ?Intake/Output Summary (Last 24 hours) at 12/01/2021 0759 ?Last data filed at 12/01/2021 0600 ?Gross per 24 hour  ?Intake 232.84 ml  ?Output --  ?Net 232.84 ml  ? ?Filed Weights  ? 11/30/21 2252 11/30/21 2312  ?Weight: 76 kg 69.9 kg  ? ? ?Examination: ? ?General:  Resting comfortably in bed ?HENT: NCAT OP clear ?PULM: CTA B, normal effort ?CV: RRR, no mgr ?GI: BS+, soft, nontender ?MSK: normal bulk and tone ?Derm: slight macular rash extensor surfaces ?Neuro: awake, alert, no distress, MAEW ? ? ?Resolved Hospital Problem list   ? ? ?Assessment & Plan:  ?Remains critically ill due to Hypertensive emergency with associated PRES syndrome ?Headache related to PRES ?Poorly controlled hypertension at baseline with hypokalemia, concern for hyper aldosteronism; has been worked up for pheochromocytoma as well; reportedly not taking coreg, clonidine at home.  He says he has been taking hydralazine and wonders if it is causing his  itching.  ?3/4 visual changes have improved ?Current BP goal is < 180/120; at 1600 today goal can be lowered to < 160/110 ?Continue close neuro monitoring in ICU setting ?Add home amlodipine ?Discuss med rec with pharmacy: was he taking hydralazine? ?Hold coreg for now, will add back 3/5 ?Hold hydralazine ?Hold clonidine ? ?AKI on CKD4 ?Renal consulted ?Monitor BMET and UOP ?Replace electrolytes as needed ? ?Itching: hydralazine related? ?Benadryl ?Hold hydralazine ? ?Thrombocytopenia without bleeding ?Monitor for bleeding ?Transfuse PRBC for Hgb < 7 gm/dL ? ?Demand ischemia ?Tele ?ASA 81mg  ? ?Grade 2 diastolic dysfunction, last echo 09/2021 ?Tele ?Monitor hemodynamics as above ? ?GERD ?PPI ? ? ?Best Practice (right click and "Reselect all SmartList Selections" daily)  ? ?Diet/type: Regular consistency (see orders) ?DVT prophylaxis: prophylactic heparin  ?GI prophylaxis: N/A ?Lines: N/A ?Foley:  N/A ?Code Status:  full code ?Last date of multidisciplinary goals of care discussion [3/3] ? ?Labs   ?CBC: ?Recent Labs  ?Lab 11/30/21 ?1707 11/30/21 ?1842 11/30/21 ?2324 12/01/21 ?0251  ?WBC 12.8*  --  15.5* 14.0*  ?NEUTROABS 10.6*  --   --   --   ?HGB 11.3* 10.9* 10.9* 10.8*  ?HCT 34.2* 32.0* 32.2* 31.0*  ?MCV 79.4*  --  78.3* 77.7*  ?PLT 87*  --  107* 91*  ? ? ?Basic Metabolic Panel: ?Recent Labs  ?Lab 11/30/21 ?1707 11/30/21 ?1842 11/30/21 ?2324 12/01/21 ?0251  ?NA 130* 132* 129* 129*  ?K 3.5 3.5 3.0* 3.1*  ?CL 92* 97* 91* 89*  ?CO2 22  --  22 23  ?GLUCOSE 157* 147* 162* 177*  ?BUN 50* 44* 53* 55*  ?CREATININE 5.62* 6.50* 6.33* 6.19*  ?CALCIUM 9.6  --  9.5 9.3  ?MG  --   --  2.2 2.3  ?PHOS  --   --  4.8* 5.8*  ? ?GFR: ?Estimated Creatinine Clearance: 15.1 mL/min (A) (by C-G formula based on SCr of 6.19 mg/dL (H)). ?Recent Labs  ?Lab 11/30/21 ?1707 11/30/21 ?2324 12/01/21 ?0251  ?WBC 12.8* 15.5* 14.0*  ?LATICACIDVEN  --  2.0*  --   ? ? ?Liver Function Tests: ?Recent Labs  ?Lab 11/30/21 ?2324  ?AST 27  ?ALT 13  ?ALKPHOS  104  ?BILITOT 0.8  ?PROT 9.1*  ?ALBUMIN 3.7  ? ?No results for input(s): LIPASE, AMYLASE in the last 168 hours. ?No results for input(s): AMMONIA in the last 168 hours. ? ?ABG ?   ?Component Value Date/Time  ? TCO2 23 11/30/2021 1842  ?  ? ?Coagulation Profile: ?Recent Labs  ?Lab 11/30/21 ?8325  ?INR 1.2  ? ? ?Cardiac Enzymes: ?Recent Labs  ?Lab 11/30/21 ?2324  ?CKTOTAL 50  ? ? ?HbA1C: ?Hgb A1c MFr Bld  ?Date/Time Value Ref Range Status  ?10/10/2021 05:58 PM 4.7 (L) 4.8 - 5.6 % Final  ?  Comment:  ?  (NOTE) ?Pre diabetes:          5.7%-6.4% ? ?Diabetes:              >6.4% ? ?Glycemic control for   <7.0% ?adults with diabetes ?  ? ? ?CBG: ?No results for input(s): GLUCAP in the last 168 hours. ? ?Critical care time: 32 minutes ?  ? ?Roselie Awkward, MD ?[facility] ?Pager: [phone] ?Cell: [phone] ?After 7:00 pm call Elink  [phone] ? ? ? ? ? ?

## 2021-12-01 NOTE — Consult Note (Signed)
Neurology Consult H&P ? ?Roy Randolph ?MR# [id number] ?12/01/2021 ? ? ?CC: PRES ? ?History is obtained from: patient and chart. ? ?HPI: Roy Randolph is a 46 y.o. male PMHx as reviewed below, uncontrolled chronic HTN with gradually worsening HA and visual changes over the past 2 weeks now  unable to make out shapes or colors. He reports having similar symptoms January 2023 which improved.  He states his vision has now much improved however not yet back to baseline ? ?ROS: A complete ROS was performed and is negative except as noted in the HPI.   ? ?Past Medical History:  ?Diagnosis Date  ? Alcohol use   ? Hypertension   ? Noncompliance   ? Smoking   ? ? ?Family History  ?Problem Relation Age of Onset  ? Hypertension Mother   ? Cervical cancer Mother   ? Breast cancer Sister   ? Sudden Cardiac Death Brother 53  ? Heart disease Maternal Grandmother   ? Cancer Maternal Grandmother   ? ? ?Social History:  reports that he has been smoking cigarettes. He started smoking about 25 years ago. He has been smoking an average of .3 packs per day. He does not have any smokeless tobacco history on file. He reports that he does not currently use alcohol after a past usage of about 12.0 standard drinks per week. He reports current drug use. Drug: Marijuana. ? ? ?Prior to Admission medications   ?Medication Sig Start Date End Date Taking? Authorizing Provider  ?acetaminophen (TYLENOL) 325 MG tablet Take 325-650 mg by mouth every 6 (six) hours as needed for mild pain or headache.   Yes [provider]  ?amLODipine (NORVASC) 10 MG tablet Take 1 tablet (10 mg total) by mouth daily. 11/12/21  Yes Ladell Pier, MD  ?Ascorbic Acid (VITAMIN C PO) Take 1 tablet by mouth daily.   Yes [provider]  ?butalbital-acetaminophen-caffeine (FIORICET) 50-325-40 MG tablet Take 1 tablet by mouth every 4 (four) hours as needed for headache or migraine. 10/15/21  Yes Lavina Hamman, MD  ?CALCIUM PO Take 1 tablet by mouth  daily.   Yes [provider]  ?methocarbamol (ROBAXIN) 500 MG tablet Take 1 tablet (500 mg total) by mouth daily as needed for muscle spasms. 11/12/21  Yes Ladell Pier, MD  ?pantoprazole (PROTONIX) 40 MG tablet Take 1 tablet (40 mg total) by mouth daily. 11/12/21  Yes Ladell Pier, MD  ?spironolactone (ALDACTONE) 50 MG tablet Take 1 tablet (50 mg total) by mouth daily. 11/12/21  Yes Ladell Pier, MD  ?SUMAtriptan (IMITREX) 25 MG tablet 1 tab at start of headache.May repeat in 2 hours if headache persists or recurs. Max 2 tabs/24 hrs ?Patient taking differently: Take 25 mg by mouth See admin instructions. Take 25 mg by mouth at the start of a headache and may repeat in 2 hours if headache persists or recurs. Max 2 tabs/24 hrs. 11/12/21  Yes Ladell Pier, MD  ?amitriptyline (ELAVIL) 25 MG tablet Take 1 tablet (25 mg total) by mouth at bedtime. ?Patient not taking: Reported on 11/30/2021 11/12/21   Ladell Pier, MD  ?B Complex-C (B-COMPLEX WITH VITAMIN C) tablet Take 1 tablet by mouth daily. ?Patient not taking: Reported on 11/30/2021 10/16/21   Lavina Hamman, MD  ?carvedilol (COREG) 25 MG tablet Take 1 tablet (25 mg total) by mouth 2 (two) times daily with a meal. ?Patient not taking: Reported on 11/30/2021 10/15/21   Lavina Hamman,  MD  ?cloNIDine (CATAPRES) 0.1 MG tablet Take 0.1 mg by mouth 2 (two) times daily. ?Patient not taking: Reported on 11/30/2021    [provider]  ?cyanocobalamin 1000 MCG tablet Take 1 tablet (1,000 mcg total) by mouth daily. ?Patient not taking: Reported on 11/30/2021 11/12/21   Ladell Pier, MD  ?hydrALAZINE (APRESOLINE) 50 MG tablet Take 1 tablet (50 mg total) by mouth 3 (three) times daily. ?Patient not taking: Reported on 11/30/2021 11/12/21   Ladell Pier, MD  ? ? ?Exam: ?Current vital signs: ?BP (!) 161/102 Comment: titrated  Pulse 94   Temp 98.4 ?F (36.9 ?C) (Oral)   Resp (!) 21   Ht 5\' 9"  (1.753 m)   Wt 69.9 kg   SpO2 93%   BMI  22.76 kg/m?  ? ?Physical Exam  ?Constitutional: Appears well-developed and well-nourished.  ?Psych: Affect appropriate to situation ?Eyes: No scleral injection ?HENT: No OP obstruction. ?Head: Normocephalic.  ?Cardiovascular: Normal rate and regular rhythm.  ?Respiratory: Effort normal, symmetric excursions bilaterally, no audible wheezing. ?GI: Soft.  No distension. There is no tenderness.  ?Skin: WDI ? ?Neuro: ?Mental Status: ?Patient is awake, alert, oriented to person, place, month, year, and situation. ?Patient is able to give a clear and coherent history. ?Speech fluent, intact comprehension and repetition. ?No signs of aphasia or neglect. ?Visual Fields are full. Pupils are equal, round, and reactive to light. ?EOMI without ptosis or diplopia.  ?Facial sensation is symmetric to temperature ?Facial movement is symmetric.  ?Hearing is intact to voice. ?Uvula midline and palate elevates symmetrically. ?Shoulder shrug is symmetric. ?Tongue is midline without atrophy or fasciculations.  ?Tone is normal. Bulk is normal. 5/5 strength was present in all four extremities. ?Sensation is symmetric to light touch and temperature in the arms and legs. ?Deep Tendon Reflexes: 2+ and symmetric in the biceps and patellae. ?Toes are downgoing bilaterally. ?FNF and HKS are intact bilaterally. ?Gait - Deferred ? ?I have reviewed labs in epic and the pertinent results are: ?Lactic Acid of 2.0 ? ?I have reviewed the images obtained: ?MRI brain showed no acute intracranial abnormality. Hyperintense T2-weighted signal and multifocal chronic ?microhemorrhage within both occipital lobes and cerebellar ?hemispheres. ? ?Assessment: Roy Randolph is a 46 y.o. male PMHx uncontrolled chronic HTN, likely previous episode of PRES January 2023 with hypertensive emergency (BP 223/156) and recurrence of PRES. he has had blurry vision for the past 3 days which is now improving. he states that he has been compliant with his blood pressure  medication however when he takes the medication and he bends over at work he tends to get lightheaded and dizzy which completely resolves.  This has been going on for several months.  He was evaluated by nephrology found to have proteinuria however he has no clear plan.  He was also supposed to see cardiology today however he would not be able to due to admission. ? ?Impression:  ?PRES ?Malignant HTN ?Uncontrolled HTN ? ?Plan: ?Recommend continue clevidipine to gradually reduce MAP~10-20% first hour (<180/<118mmHg) then by 5-15% over next 23 hours.  ?Then <160/<135mmHg for the next 23 hours for target BP <140/90. ? ?Consult nephrology for assistance with proteinuria and hypertension. ?Consider cardiology consult for assistance antihypertensive. ? ?This patient is critically ill and at significant risk of neurological worsening, death and care requires constant monitoring of vital signs, hemodynamics,respiratory and cardiac monitoring, neurological assessment, discussion with family, other specialists and medical decision making of high complexity. I spent 65 minutes of neurocritical care  time  in the care of  this patient. This was time spent independent of any time provided by nurse practitioner or PA. ? ?Electronically signed by:  ?Lynnae Sandhoff, MD ?Page: [phone] ?12/01/2021, 1:23 AM ? ?If 7pm- 7am, please page neurology on call as listed in [place]. ? ?

## 2021-12-01 NOTE — Progress Notes (Signed)
Initial Nutrition Assessment ? ?DOCUMENTATION CODES:  ? ?Not applicable ? ?INTERVENTION:  ? ?Follow-up and review/reinforce low sodium diet; reinforce importance of strict BP control with regards to delaying progression of CKD ? ?Add Renal MVI daily ? ?If po intake inadequate, recommend addition of nutrition supplement ? ?NUTRITION DIAGNOSIS:  ? ?Inadequate oral intake related to acute illness as evidenced by NPO status. ? ?GOAL:  ? ?Patient will meet greater than or equal to 90% of their needs ? ?MONITOR:  ? ?PO intake, Labs, Weight trends ? ?REASON FOR ASSESSMENT:  ? ?Malnutrition Screening Tool ?  ? ?ASSESSMENT:  ? ?46 yo male admitted with HTN emergency with PRES syndrome, AKI on CKD 4. PMH includes poorly controlled HTN, tobacco use, former EtOH use ? ?3/3 Admitted ? ?Remains on Cleviprex ? ?Currently no active diet order; discussed with RN. Plan to advance diet today, recommend 2g sodium ? ?Phosphorus elevated at present, monitor trend. Noted no current indication for dialysis at present per Nephrology ? ?Noted pt was educated on low sodium diet by inpatient RD in January 2023. RD to follow up and reinforce importance of following a low sodium diet and controlling HTN with regards to delaying the progression of CKD ? ?Noted pt indicating he cannot heat raw/fresh fruits and vegetables; he can eat cooked/canned versions ? ?Weight is down per weight encounters. Current wt 69.9 kg. Noted weight of 76.7 kg in Feb 2023, 74.1 kg Jan 2023 ? ?Labs: sodium 129 (L), potassium 3.1 (L), Creatinine 6.19, BUN 55, phosphorus 5.8 (H) ?Meds: reviewed ? ?NUTRITION - FOCUSED PHYSICAL EXAM: ? ?Unable to assess ? ?Diet Order:   ?Diet Order   ? ?       ?  Diet Heart Room service appropriate? Yes; Fluid consistency: Thin  Diet effective now       ?  ? ?  ?  ? ?  ? ? ?EDUCATION NEEDS:  ? ?Education needs have been addressed ? ?Skin:  Skin Assessment: Reviewed RN Assessment ? ?Last BM:  PTA ? ?Height:  ? ?Ht Readings from Last 1  Encounters:  ?11/30/21 5\' 9"  (1.753 m)  ? ? ?Weight:  ? ?Wt Readings from Last 1 Encounters:  ?11/30/21 69.9 kg  ? ? ? ?BMI:  Body mass index is 22.76 kg/m?. ? ?Estimated Nutritional Needs:  ? ?Kcal:  2100-2300 kcals ? ?Protein:  85-100 g ? ?Fluid:  >/= 2 L ? ? ? ?Kerman Passey MS, RDN, LDN, CNSC ?Registered Dietitian III ?Clinical Nutrition ?RD Pager and On-Call Pager Number Located in [place]  ? ?

## 2021-12-01 NOTE — Consult Note (Signed)
[place] Date: 11/30/2021 12/01/2021 Roy Randolph Requesting Physician:  Lake Bells MD  Reason for Consult:  AoCKD, HTN emergency HPI:  63M PMH including longstanding hypertension, tobacco use, former alcohol use, migraines who presented overnight to the ED with progressive headache and vision changes.  Presenting blood pressures were as high as 231/164.  CT of the head was consistent with PRES syndrome.  He was admitted to the ICU, placed on clevidipine drip, and neurology consult.  Blood pressures have slowly improved, most recently 170s over 100s.  He was admitted in January for similar concerns.  At that time his creatinine was around 2.0.  Work-up demonstrated findings consistent with a TMA, suppressed aldosterone and renin, negative renal duplex.  Plasma metanephrines were very mildly elevated.  Working etiology is malignant hypertension without identified secondary cause.  His discharge creatinine was 2.4 on 10/15/2021.  He was seen in our office on 2/2 by Dr. Candiss Norse.  His creatinine was 2.9 at that time.  He had not yet obtained his blood pressure medications.  His blood pressure was 190/120.  He was seen by PCP on 2/13 and his blood pressure was 157/94.  Creatinine was 3.7.  He had only been on medication for 1 week, which was only carvedilol and clonidine.  Currently he feels good.  His vision has improved somewhat.  Headache is improved.  He denies use of NSAIDs prior to admission.  His creatinine has worsened at presentation compared to the past several months, as demonstrated below.  Creatinine, Ser (mg/dL)  Date Value  12/01/2021 6.19 (H)  11/30/2021 6.33 (H)  11/30/2021 6.50 (H)  11/30/2021 5.62 (H)  11/12/2021 3.72 (H)  10/15/2021 2.40 (H)  10/14/2021 2.09 (H)  10/13/2021 2.11 (H)  10/12/2021 1.81 (H)  10/11/2021 2.11 (H)  ] No nausea, vomiting, dysgeusia, anorexia  I/Os: I/O last 3 completed shifts: In: 232.8 [I.V.:232.8] Out: -   ROS NSAIDS: No  identified exposure IV Contrast no exposure Balance of 12 systems is negative w/ exceptions as above  PMH  Past Medical History:  Diagnosis Date   Alcohol use    Hypertension    Noncompliance    Smoking    PSH  Past Surgical History:  Procedure Laterality Date   THORACOTOMY Right    FH  Family History  Problem Relation Age of Onset   Hypertension Mother    Cervical cancer Mother    Breast cancer Sister    Sudden Cardiac Death Brother 70   Heart disease Maternal Grandmother    Cancer Maternal Grandmother    SH  reports that he has been smoking cigarettes. He started smoking about 25 years ago. He has been smoking an average of .3 packs per day. He does not have any smokeless tobacco history on file. He reports that he does not currently use alcohol after a past usage of about 12.0 standard drinks per week. He reports current drug use. Drug: Marijuana. Allergies  Allergies  Allergen Reactions   Cucumber Extract Anaphylaxis and Itching   Fruit Extracts Anaphylaxis, Itching, Swelling and Other (See Comments)    The patient CANNOT eat raw/fresh fruit or vegetables. Reaction starts with ears itching, then progresses from there. He can eat canned versions of these foods, however.   Other Anaphylaxis, Itching, Swelling and Other (See Comments)    The patient CANNOT eat raw/fresh fruit or vegetables. Reaction starts with ears itching, then progresses from there. He can eat canned versions of these foods, however.   Tomato (  Diagnostic) Anaphylaxis and Swelling   Wild Lettuce [Lactuca Virosa] Anaphylaxis and Swelling   Hydralazine Hives, Itching and Other (See Comments)    SEVERE ITCHING   Home medications Prior to Admission medications   Medication Sig Start Date End Date Taking? Authorizing Provider  acetaminophen (TYLENOL) 325 MG tablet Take 325-650 mg by mouth every 6 (six) hours as needed for mild pain or headache.   Yes [provider]  amLODipine (NORVASC) 10 MG  tablet Take 1 tablet (10 mg total) by mouth daily. 11/12/21  Yes Ladell Pier, MD  Ascorbic Acid (VITAMIN C PO) Take 1 tablet by mouth daily.   Yes [provider]  butalbital-acetaminophen-caffeine (FIORICET) 50-325-40 MG tablet Take 1 tablet by mouth every 4 (four) hours as needed for headache or migraine. 10/15/21  Yes Lavina Hamman, MD  CALCIUM PO Take 1 tablet by mouth daily.   Yes [provider]  methocarbamol (ROBAXIN) 500 MG tablet Take 1 tablet (500 mg total) by mouth daily as needed for muscle spasms. 11/12/21  Yes Ladell Pier, MD  pantoprazole (PROTONIX) 40 MG tablet Take 1 tablet (40 mg total) by mouth daily. 11/12/21  Yes Ladell Pier, MD  spironolactone (ALDACTONE) 50 MG tablet Take 1 tablet (50 mg total) by mouth daily. 11/12/21  Yes Ladell Pier, MD  SUMAtriptan (IMITREX) 25 MG tablet 1 tab at start of headache.May repeat in 2 hours if headache persists or recurs. Max 2 tabs/24 hrs Patient taking differently: Take 25 mg by mouth See admin instructions. Take 25 mg by mouth at the start of a headache and may repeat in 2 hours if headache persists or recurs. Max 2 tabs/24 hrs. 11/12/21  Yes Ladell Pier, MD  amitriptyline (ELAVIL) 25 MG tablet Take 1 tablet (25 mg total) by mouth at bedtime. Patient not taking: Reported on 11/30/2021 11/12/21   Ladell Pier, MD  B Complex-C (B-COMPLEX WITH VITAMIN C) tablet Take 1 tablet by mouth daily. Patient not taking: Reported on 11/30/2021 10/16/21   Lavina Hamman, MD  carvedilol (COREG) 25 MG tablet Take 1 tablet (25 mg total) by mouth 2 (two) times daily with a meal. Patient not taking: Reported on 11/30/2021 10/15/21   Lavina Hamman, MD  cloNIDine (CATAPRES) 0.1 MG tablet Take 0.1 mg by mouth 2 (two) times daily. Patient not taking: Reported on 11/30/2021    [provider]  cyanocobalamin 1000 MCG tablet Take 1 tablet (1,000 mcg total) by mouth daily. Patient not taking: Reported on  11/30/2021 11/12/21   Ladell Pier, MD  hydrALAZINE (APRESOLINE) 50 MG tablet Take 1 tablet (50 mg total) by mouth 3 (three) times daily. Patient not taking: Reported on 11/30/2021 11/12/21   Ladell Pier, MD    Current Medications Scheduled Meds:  amLODipine  10 mg Oral Daily   aspirin EC  81 mg Oral Daily   Chlorhexidine Gluconate Cloth  6 each Topical Daily   heparin injection (subcutaneous)  5,000 Units Subcutaneous Q8H   pantoprazole  40 mg Oral Daily   Continuous Infusions:  clevidipine 8 mg/hr (12/01/21 0900)   PRN Meds:.diphenhydrAMINE, LORazepam  CBC Recent Labs  Lab 11/30/21 1707 11/30/21 1842 11/30/21 2324 12/01/21 0251  WBC 12.8*  --  15.5* 14.0*  NEUTROABS 10.6*  --   --   --   HGB 11.3* 10.9* 10.9* 10.8*  HCT 34.2* 32.0* 32.2* 31.0*  MCV 79.4*  --  78.3* 77.7*  PLT 87*  --  107* 91*   Basic Metabolic Panel Recent Labs  Lab 11/30/21 1707 11/30/21 1842 11/30/21 2324 12/01/21 0251  NA 130* 132* 129* 129*  K 3.5 3.5 3.0* 3.1*  CL 92* 97* 91* 89*  CO2 22  --  22 23  GLUCOSE 157* 147* 162* 177*  BUN 50* 44* 53* 55*  CREATININE 5.62* 6.50* 6.33* 6.19*  CALCIUM 9.6  --  9.5 9.3  PHOS  --   --  4.8* 5.8*    Physical Exam   Blood pressure (!) 175/103, pulse 92, temperature 98.6 F (37 C), temperature source Oral, resp. rate 19, height 5\' 9"  (1.753 m), weight 69.9 kg, SpO2 93 %. GEN: NAD, lying ENT: NCAT EYES: EOMI CV: Regular, normal S1-2 PULM: Clear bilaterally, normal work of breathing ABD: Soft, nontender, no abdominal bruits, bowel present SKIN: No edema, rashes, purpura EXT:NO LEE   Assessment 21M with AoCKD4 from malignant HTN, here with PRES.  AoCKD4: He has accelerated hypertensive sclerosis/hypertensive TMA.  Indefinite consistent blood pressure control will be key to best kidney outcomes, but he is at high risk of progression to dialysis dependence.  No current HD indications.  HTN Emergency / PRES: on IV clevidipine. Agree with  stepwise addition of PO agents back.  WIll assist CCM.  NO changes right now.  WOuld go amlodipine --> carvedilol + chlorthalidone --> hydralazine --> ? Minoxidil ANemia, MIld.   BOrderline nephrotic proteinuria related to #1  Plan As above Daily weights, Daily Renal Panel, Strict I/Os, Avoid nephrotoxins (NSAIDs, judicious IV Contrast)    Roy Randolph  12/01/2021, 10:33 AM

## 2021-12-01 NOTE — Progress Notes (Signed)
Notified CCM of critical Lactic Acid of 2.0.  No new orders at this time.   ?

## 2021-12-02 DIAGNOSIS — I161 Hypertensive emergency: Secondary | ICD-10-CM | POA: Diagnosis not present

## 2021-12-02 LAB — COMPREHENSIVE METABOLIC PANEL
ALT: 11 U/L (ref 0–44)
AST: 15 U/L (ref 15–41)
Albumin: 3.2 g/dL — ABNORMAL LOW (ref 3.5–5.0)
Alkaline Phosphatase: 99 U/L (ref 38–126)
Anion gap: 16 — ABNORMAL HIGH (ref 5–15)
BUN: 63 mg/dL — ABNORMAL HIGH (ref 6–20)
CO2: 22 mmol/L (ref 22–32)
Calcium: 8.9 mg/dL (ref 8.9–10.3)
Chloride: 91 mmol/L — ABNORMAL LOW (ref 98–111)
Creatinine, Ser: 7.84 mg/dL — ABNORMAL HIGH (ref 0.61–1.24)
GFR, Estimated: 8 mL/min — ABNORMAL LOW (ref >60)
Glucose, Bld: 124 mg/dL — ABNORMAL HIGH (ref 70–99)
Potassium: 2.8 mmol/L — ABNORMAL LOW (ref 3.5–5.1)
Sodium: 129 mmol/L — ABNORMAL LOW (ref 135–145)
Total Bilirubin: 0.4 mg/dL (ref 0.3–1.2)
Total Protein: 7.9 g/dL (ref 6.5–8.1)

## 2021-12-02 LAB — POTASSIUM: Potassium: 3.2 mmol/L — ABNORMAL LOW (ref 3.5–5.1)

## 2021-12-02 LAB — TRIGLYCERIDES: Triglycerides: 370 mg/dL — ABNORMAL HIGH (ref <150)

## 2021-12-02 LAB — HAPTOGLOBIN: Haptoglobin: 12 mg/dL — ABNORMAL LOW (ref 23–355)

## 2021-12-02 MED ORDER — NICARDIPINE HCL IN NACL 20-0.86 MG/200ML-% IV SOLN
3.0000 mg/h | INTRAVENOUS | Status: DC
  Administered 2021-12-02 (×2): 15 mg/h via INTRAVENOUS
  Administered 2021-12-02: 10 mg/h via INTRAVENOUS
  Administered 2021-12-02: 3 mg/h via INTRAVENOUS
  Administered 2021-12-03 (×2): 5 mg/h via INTRAVENOUS
  Filled 2021-12-02 (×2): qty 200
  Filled 2021-12-02: qty 400
  Filled 2021-12-02: qty 200

## 2021-12-02 MED ORDER — POTASSIUM CHLORIDE CRYS ER 20 MEQ PO TBCR
40.0000 meq | EXTENDED_RELEASE_TABLET | Freq: Once | ORAL | Status: AC
  Administered 2021-12-02: 40 meq via ORAL
  Filled 2021-12-02: qty 2

## 2021-12-02 MED ORDER — LABETALOL HCL 5 MG/ML IV SOLN: 20.0000 mg | INTRAVENOUS | Status: DC | PRN

## 2021-12-02 MED ORDER — LABETALOL HCL 5 MG/ML IV SOLN
20.0000 mg | INTRAVENOUS | Status: DC | PRN
  Administered 2021-12-02: 20 mg via INTRAVENOUS
  Filled 2021-12-02: qty 4

## 2021-12-02 MED ORDER — CARVEDILOL 12.5 MG PO TABS
12.5000 mg | ORAL_TABLET | Freq: Two times a day (BID) | ORAL | Status: DC
Start: 2021-12-02 — End: 2021-12-03
  Administered 2021-12-02 – 2021-12-03 (×2): 12.5 mg via ORAL
  Filled 2021-12-02 (×2): qty 1

## 2021-12-02 NOTE — Progress Notes (Signed)
? ?NAME:  Roy Randolph, MRN:  [id number], DOB:  1976-08-09, LOS: 2 ?ADMISSION DATE:  11/30/2021, CONSULTATION DATE:  3/4 ?REFERRING MD:  Roderic Palau, CHIEF COMPLAINT:  headache, visual changes  ? ?History of Present Illness:  ?46 y/o male with a history of difficult to control hypertension presented with visual changes and headache on 3/3, diagnosed with PRES syndrome.   ? ?Pertinent  Medical History  ?HTN  ?Hx of HTN urgency  ?Former Tobacco use (quit 09/2021)  ?Former EtOH use  ? ?Significant Hospital Events: ?Including procedures, antibiotic start and stop dates in addition to other pertinent events   ?3/3 admission to ICU started on cleviprex ?3/4 itching persists, visual changes have improved ? ?Interim History / Subjective:  ? ?Feels better ?Visual changes improving ? ?Objective   ?Blood pressure (!) 157/83, pulse 96, temperature 98.9 ?F (37.2 ?C), temperature source Oral, resp. rate 14, height 5\' 9"  (1.753 m), weight 69.9 kg, SpO2 95 %. ?   ?   ? ?Intake/Output Summary (Last 24 hours) at 12/02/2021 0724 ?Last data filed at 12/02/2021 3295 ?Gross per 24 hour  ?Intake 1041.07 ml  ?Output 1400 ml  ?Net -358.93 ml  ? ?Filed Weights  ? 11/30/21 2252 11/30/21 2312  ?Weight: 76 kg 69.9 kg  ? ? ?Examination: ? ?General:  Resting comfortably in bed ?HENT: NCAT OP clear ?PULM: CTA B, normal effort ?CV: RRR, no mgr ?GI: BS+, soft, nontender ?MSK: normal bulk and tone ?Neuro: awake, alert, no distress, MAEW ? ? ? ?Resolved Hospital Problem list   ? ? ?Assessment & Plan:  ?Remains critically ill due to Hypertensive emergency with associated PRES syndrome ?Headache related to PRES ?Poorly controlled hypertension at baseline with hypokalemia, concern for hyper aldosteronism; has been worked up for pheochromocytoma as well; reportedly not taking coreg, clonidine at home.  He says he has been taking hydralazine and wonders if it is causing his itching.  ?3/5 improving ?Add coreg 12.5 mg bid ?Change cleviprex to cardene given  elevated triglycerides ?Goal today 160/110, consider dropping goal again later in the week ?Hold hydralazine ?Hold clonidine ?Ask cardiology about hypertension work up from January 2023 (had assessment for pheo, renin-aldo etc)> consider consulting them again on 3/6 (he was supposed to see them on 3/8) ? ?AKI on CKD4 > worsening in setting of hypertensive emergency ?Associated hypokalemia ?Monitor BMET and UOP ?Replace electrolytes as needed ?Renal consult appreciated, gradually bring BP down ? ?Itching: hydralazine related? ?Benadryl ?Hold hydralazine ? ?Thrombocytopenia without bleeding ?Monitor for bleeding ?Transfuse PRBC for Hgb < 7 gm/dL ? ?Demand ischemia ?Tele ?Asa 81 mg ? ?Grade 2 diastolic dysfunction, last echo 09/2021 ?Tele ?Monitor hemodynamics as above ? ?GERD ?PPI ? ? ?Best Practice (right click and "Reselect all SmartList Selections" daily)  ? ?Diet/type: Regular consistency (see orders) ?DVT prophylaxis: prophylactic heparin  ?GI prophylaxis: N/A ?Lines: N/A ?Foley:  N/A ?Code Status:  full code ?Last date of multidisciplinary goals of care discussion [3/3] ? ?Labs   ?CBC: ?Recent Labs  ?Lab 11/30/21 ?1707 11/30/21 ?1842 11/30/21 ?2324 12/01/21 ?0251  ?WBC 12.8*  --  15.5* 14.0*  ?NEUTROABS 10.6*  --   --   --   ?HGB 11.3* 10.9* 10.9* 10.8*  ?HCT 34.2* 32.0* 32.2* 31.0*  ?MCV 79.4*  --  78.3* 77.7*  ?PLT 87*  --  107* 91*  ? ? ?Basic Metabolic Panel: ?Recent Labs  ?Lab 11/30/21 ?1707 11/30/21 ?1884 11/30/21 ?2324 12/01/21 ?1660 12/02/21 ?6301  ?NA 130* 132* 129* 129* 129*  ?K  3.5 3.5 3.0* 3.1* 2.8*  ?CL 92* 97* 91* 89* 91*  ?CO2 22  --  22 23 22   ?GLUCOSE 157* 147* 162* 177* 124*  ?BUN 50* 44* 53* 55* 63*  ?CREATININE 5.62* 6.50* 6.33* 6.19* 7.84*  ?CALCIUM 9.6  --  9.5 9.3 8.9  ?MG  --   --  2.2 2.3  --   ?PHOS  --   --  4.8* 5.8*  --   ? ?GFR: ?Estimated Creatinine Clearance: 11.9 mL/min (A) (by C-G formula based on SCr of 7.84 mg/dL (H)). ?Recent Labs  ?Lab 11/30/21 ?1707 11/30/21 ?2324  12/01/21 ?0251  ?WBC 12.8* 15.5* 14.0*  ?LATICACIDVEN  --  2.0*  --   ? ? ?Liver Function Tests: ?Recent Labs  ?Lab 11/30/21 ?2324 12/02/21 ?0305  ?AST 27 15  ?ALT 13 11  ?ALKPHOS 104 99  ?BILITOT 0.8 0.4  ?PROT 9.1* 7.9  ?ALBUMIN 3.7 3.2*  ? ?No results for input(s): LIPASE, AMYLASE in the last 168 hours. ?No results for input(s): AMMONIA in the last 168 hours. ? ?ABG ?   ?Component Value Date/Time  ? TCO2 23 11/30/2021 1842  ?  ? ?Coagulation Profile: ?Recent Labs  ?Lab 11/30/21 ?1833  ?INR 1.2  ? ? ?Cardiac Enzymes: ?Recent Labs  ?Lab 11/30/21 ?2324  ?CKTOTAL 50  ? ? ?HbA1C: ?Hgb A1c MFr Bld  ?Date/Time Value Ref Range Status  ?10/10/2021 05:58 PM 4.7 (L) 4.8 - 5.6 % Final  ?  Comment:  ?  (NOTE) ?Pre diabetes:          5.7%-6.4% ? ?Diabetes:              >6.4% ? ?Glycemic control for   <7.0% ?adults with diabetes ?  ? ? ?CBG: ?No results for input(s): GLUCAP in the last 168 hours. ? ?Critical care time: 31 minutes ?  ? ?Roselie Awkward, MD ?[facility] ?Pager: [phone] ?Cell: [phone] ?After 7:00 pm call Elink  [phone] ? ? ? ? ? ?

## 2021-12-02 NOTE — Progress Notes (Signed)
eLink Physician-Brief Progress Note ?Patient Name: Roy Randolph ?DOB: [birth year] ?MRN: [id number] ? ? ?Date of Service ? 12/02/2021  ?HPI/Events of Note ? AM K+2.8 with Cr 7.84  GFR 8    Able to take PO's, has PIV's  ?eICU Interventions ? Klor con 40 meq oral once. Follow K level at 9 AM.   ? ? ? ?Intervention Category ?Intermediate Interventions: Electrolyte abnormality - evaluation and management ? ?Elmer Sow ?12/02/2021, 6:04 AM ?

## 2021-12-02 NOTE — Progress Notes (Addendum)
Admit: 11/30/2021 ?LOS: 2 ? 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 ? ?Subjective:  ?BPs improving, on clevidipine  ?SCr worsened to 7.8, K 2.8; given 46mEq this AM ?1.4L UOP ?No c/o from pt. No N/V; good PO ?HA and vision improved ? ?03/04 0701 - 03/05 0700 ?In: 1041.1 [P.O.:500; I.V.:541.1] ?Out: 1400 [Urine:1400] ? ?Filed Weights  ? 11/30/21 2252 11/30/21 2312  ?Weight: 76 kg 69.9 kg  ? ? ?Scheduled Meds: ? amLODipine  10 mg Oral Daily  ? aspirin EC  81 mg Oral Daily  ? Chlorhexidine Gluconate Cloth  6 each Topical Daily  ? heparin injection (subcutaneous)  5,000 Units Subcutaneous Q8H  ? multivitamin  1 tablet Oral QHS  ? pantoprazole  40 mg Oral Daily  ? ?Continuous Infusions: ? clevidipine 18 mg/hr (12/02/21 0935)  ? ?PRN Meds:.diphenhydrAMINE, LORazepam ? ?Current Labs: reviewed  ? ? ?Physical Exam:  Blood pressure 134/71, pulse 98, temperature 98.3 ?F (36.8 ?C), temperature source Oral, resp. rate (!) 23, height 5\' 9"  (1.753 m), weight 69.9 kg, SpO2 93 %. ?GEN: NAD, lying ?ENT: NCAT ?EYES: EOMI ?CV: Regular, normal S1-2 ?PULM: Clear bilaterally, normal work of breathing ?ABD: Soft, nontender, no abdominal bruits, bowel present ?SKIN: No edema, rashes, purpura ?EXT:NO LEE ?NEURO: AAO x3, no asterixus ? ?A ?AoCKD4:  ?He has accelerated hypertensive sclerosis/hypertensive TMA.   ?Indefinite consistent blood pressure control will be key to best kidney outcomes, but he is at high risk of progression to dialysis dependence.   ?No current HD indications.  ?Worsening in GFR is not surprising, cont supportive care ?HTN Emergency / PRES: on IV clevidipine. Agree with stepwise addition of PO agents back.   WOuld go amlodipine --> carvedilol / chlorthalidone --> hydralazine --> ? Minoxidil ?ANemia, MIld.   ?BOrderline nephrotic proteinuria related to #1 ? ? ?P ?Worsening GFR but no HD needs ?CTM ?Daily weights, Daily Renal Panel, Strict I/Os, Avoid nephrotoxins (NSAIDs, judicious IV Contrast)  ?Medication  Issues; ?Preferred narcotic agents for pain control are hydromorphone, fentanyl, and methadone. Morphine should not be used.  ?Baclofen should be avoided ?Avoid oral sodium phosphate and magnesium citrate based laxatives / bowel preps  ? ? ?Pearson Grippe MD ?12/02/2021, 9:59 AM ? ?Recent Labs  ?Lab 11/30/21 ?2324 12/01/21 ?0251 12/02/21 ?0305  ?NA 129* 129* 129*  ?K 3.0* 3.1* 2.8*  ?CL 91* 89* 91*  ?CO2 22 23 22   ?GLUCOSE 162* 177* 124*  ?BUN 53* 55* 63*  ?CREATININE 6.33* 6.19* 7.84*  ?CALCIUM 9.5 9.3 8.9  ?PHOS 4.8* 5.8*  --   ? ?Recent Labs  ?Lab 11/30/21 ?1707 11/30/21 ?1842 11/30/21 ?2324 12/01/21 ?0251  ?WBC 12.8*  --  15.5* 14.0*  ?NEUTROABS 10.6*  --   --   --   ?HGB 11.3* 10.9* 10.9* 10.8*  ?HCT 34.2* 32.0* 32.2* 31.0*  ?MCV 79.4*  --  78.3* 77.7*  ?PLT 87*  --  107* 91*  ? ? ? ? ? ? ? ? ? ?  ?

## 2021-12-03 ENCOUNTER — Inpatient Hospital Stay (HOSPITAL_COMMUNITY): Payer: Medicaid Other

## 2021-12-03 ENCOUNTER — Telehealth: Payer: Self-pay | Admitting: Pulmonary Disease

## 2021-12-03 DIAGNOSIS — N179 Acute kidney failure, unspecified: Secondary | ICD-10-CM

## 2021-12-03 DIAGNOSIS — I161 Hypertensive emergency: Principal | ICD-10-CM

## 2021-12-03 DIAGNOSIS — I10 Essential (primary) hypertension: Secondary | ICD-10-CM

## 2021-12-03 DIAGNOSIS — R0683 Snoring: Secondary | ICD-10-CM

## 2021-12-03 DIAGNOSIS — N184 Chronic kidney disease, stage 4 (severe): Secondary | ICD-10-CM

## 2021-12-03 LAB — COMPREHENSIVE METABOLIC PANEL
ALT: 9 U/L (ref 0–44)
AST: 15 U/L (ref 15–41)
Albumin: 3 g/dL — ABNORMAL LOW (ref 3.5–5.0)
Alkaline Phosphatase: 89 U/L (ref 38–126)
Anion gap: 13 (ref 5–15)
BUN: 68 mg/dL — ABNORMAL HIGH (ref 6–20)
CO2: 23 mmol/L (ref 22–32)
Calcium: 8.7 mg/dL — ABNORMAL LOW (ref 8.9–10.3)
Chloride: 94 mmol/L — ABNORMAL LOW (ref 98–111)
Creatinine, Ser: 7.57 mg/dL — ABNORMAL HIGH (ref 0.61–1.24)
GFR, Estimated: 8 mL/min — ABNORMAL LOW (ref >60)
Glucose, Bld: 97 mg/dL (ref 70–99)
Potassium: 2.8 mmol/L — ABNORMAL LOW (ref 3.5–5.1)
Sodium: 130 mmol/L — ABNORMAL LOW (ref 135–145)
Total Bilirubin: 0.5 mg/dL (ref 0.3–1.2)
Total Protein: 7.5 g/dL (ref 6.5–8.1)

## 2021-12-03 LAB — CBC
HCT: 26 % — ABNORMAL LOW (ref 39.0–52.0)
Hemoglobin: 8.7 g/dL — ABNORMAL LOW (ref 13.0–17.0)
MCH: 26.2 pg (ref 26.0–34.0)
MCHC: 33.5 g/dL (ref 30.0–36.0)
MCV: 78.3 fL — ABNORMAL LOW (ref 80.0–100.0)
Platelets: 128 10*3/uL — ABNORMAL LOW (ref 150–400)
RBC: 3.32 MIL/uL — ABNORMAL LOW (ref 4.22–5.81)
RDW: 17.1 % — ABNORMAL HIGH (ref 11.5–15.5)
WBC: 9.4 10*3/uL (ref 4.0–10.5)
nRBC: 0 % (ref 0.0–0.2)

## 2021-12-03 LAB — MAGNESIUM: Magnesium: 2 mg/dL (ref 1.7–2.4)

## 2021-12-03 MED ORDER — POTASSIUM CHLORIDE 20 MEQ PO PACK: 60.0000 meq | PACK | Freq: Once | ORAL | Status: DC

## 2021-12-03 MED ORDER — CLONIDINE HCL 0.1 MG PO TABS
0.1000 mg | ORAL_TABLET | Freq: Two times a day (BID) | ORAL | Status: DC
Start: 2021-12-03 — End: 2021-12-04
  Administered 2021-12-03 (×2): 0.1 mg via ORAL
  Filled 2021-12-03 (×2): qty 1

## 2021-12-03 MED ORDER — LABETALOL HCL 100 MG PO TABS
200.0000 mg | ORAL_TABLET | Freq: Two times a day (BID) | ORAL | Status: DC
  Administered 2021-12-03 (×2): 200 mg via ORAL
  Filled 2021-12-03 (×2): qty 2

## 2021-12-03 MED ORDER — POTASSIUM CHLORIDE CRYS ER 20 MEQ PO TBCR
40.0000 meq | EXTENDED_RELEASE_TABLET | Freq: Once | ORAL | Status: AC
  Administered 2021-12-03: 40 meq via ORAL
  Filled 2021-12-03: qty 2

## 2021-12-03 MED ORDER — POLYETHYLENE GLYCOL 3350 17 G PO PACK
17.0000 g | PACK | Freq: Every day | ORAL | Status: DC
  Administered 2021-12-03 – 2021-12-04 (×2): 17 g via ORAL
  Filled 2021-12-03 (×2): qty 1

## 2021-12-03 MED ORDER — DOCUSATE SODIUM 100 MG PO CAPS
100.0000 mg | ORAL_CAPSULE | Freq: Two times a day (BID) | ORAL | Status: DC
Start: 2021-12-03 — End: 2021-12-07
  Administered 2021-12-03 – 2021-12-06 (×5): 100 mg via ORAL
  Filled 2021-12-03 (×6): qty 1

## 2021-12-03 MED ORDER — POTASSIUM CHLORIDE CRYS ER 20 MEQ PO TBCR
20.0000 meq | EXTENDED_RELEASE_TABLET | Freq: Once | ORAL | Status: AC
  Administered 2021-12-03: 20 meq via ORAL
  Filled 2021-12-03: qty 1

## 2021-12-03 MED ORDER — CARVEDILOL 12.5 MG PO TABS: 25.0000 mg | ORAL_TABLET | Freq: Two times a day (BID) | ORAL | Status: DC

## 2021-12-03 NOTE — Progress Notes (Signed)
? ?NAME:  Roy Randolph, MRN:  [id number], DOB:  26-Aug-1976, LOS: 3 ?ADMISSION DATE:  11/30/2021, CONSULTATION DATE:  3/4 ?REFERRING MD:  Roderic Palau, CHIEF COMPLAINT:  headache, visual changes  ? ?History of Present Illness:  ?46 y/o male with a history of difficult to control hypertension presented with visual changes and headache on 3/3, diagnosed with PRES syndrome.   ? ?Pertinent  Medical History  ?HTN  ?Hx of HTN urgency  ?Former Tobacco use (quit 09/2021)  ?Former EtOH use  ? ?Significant Hospital Events: ?Including procedures, antibiotic start and stop dates in addition to other pertinent events   ?3/3 admission to ICU started on cleviprex ?3/4 itching persists, visual changes have improved ?3/6 Off Cardene, but BP above goal this AM and was resumed, good UOP and back to baseline neurologically  ? ?Interim History / Subjective:  ? ?BP improving, no complaints this morning, no itching  ?Reports baseline blurry vision, but has not had an eye exam in years and wears glassess ? ?Objective   ?Blood pressure (!) 173/122, pulse 86, temperature 98.5 ?F (36.9 ?C), temperature source Oral, resp. rate 20, height 5\' 9"  (1.753 m), weight 69.9 kg, SpO2 99 %. ?   ?   ? ?Intake/Output Summary (Last 24 hours) at 12/03/2021 0746 ?Last data filed at 12/03/2021 0700 ?Gross per 24 hour  ?Intake 1634.89 ml  ?Output 4300 ml  ?Net -2665.11 ml  ? ? ?Filed Weights  ? 11/30/21 2252 11/30/21 2312  ?Weight: 76 kg 69.9 kg  ? ? ?General:  thin M, awake in no acute distress ?HEENT: MM pink/moist, pupils equal and sclera anicteric ?Neuro: awake, alert, no focal deficits, moving all extremities equally  ?CV: s1s2 rrr, no m/r/g ?PULM:  clear bilaterally without rhonchi or wheezing on RA ?GI: soft, bsx4 active  ?Extremities: warm/dry, no edema, normal bulk and tone ?Skin: no rashes or lesions ? ? ? ?Resolved Hospital Problem list   ? ? ?Assessment & Plan:  ? ? ? ?Hypertensive emergency with associated PRES syndrome ?Headache related to PRES ?Poorly  controlled hypertension at baseline with hypokalemia, concern for hyper aldosteronism; has been worked up for pheochromocytoma as well; reportedly not taking coreg, clonidine at home.  He says he has been taking hydralazine and wonders if it is causing his itching.  ?-MRI with Hyperintense T2 signal and chronic micro-hemorrhages  ?-Cardene gtt off this AM and SBP >170, so was resumed ?-increase Coreg to 25mg  bid ?-resume home Clonidine  ?-hydralazine held secondary to possible itching  ?-hopefully off Cardene and out of ICU today ?-had an outpatient appt with cardiology scheduled this week, will ask for consult while admitted to assist with outpatient anti-hypertensive regimen, also had hypertension work up from January 2023 (had assessment for pheo, renin-aldo etc)>  supposed to see them on 3/8 ? ? ?Acute on Chronic Kidney Injury ?AKI on CKD4 > worsening in setting of hypertensive emergency with associated hypokalemia ?-creatinine remains elevated at 7.5 and BUN 68, though 4L UOP yesterday and suspect labs are lagging renal recovery, appreciate Nephrology following  ?-replace K and check Mag ?-continue to monitor BMET and UOP ?-Replace electrolytes as needed ? ? ?Itching: hydralazine related? ?-continue Benadryl ?-Hold hydralazine ? ?Thrombocytopenia without bleeding ?Platelets improved to 128 today ?-continue to monitor for bleeding and transfuse PRBC for Hgb < 7 gm/dL ? ?Demand ischemia ?-continue Tele and Asa 81 mg, denies chest pain or pressure ? ?Grade 2 diastolic dysfunction, last echo 09/2021 ?-continue Tele Monitor hemodynamics as above ?-holding spironolactone in  the setting of AKI ? ?GERD ?-continue PPI ? ? ?Best Practice (right click and "Reselect all SmartList Selections" daily)  ? ?Diet/type: Regular consistency (see orders) ?DVT prophylaxis: prophylactic heparin  ?GI prophylaxis: PPI ?Lines: N/A ?Foley:  N/A ?Code Status:  full code ?Last date of multidisciplinary goals of care discussion [3/3] ? ?Labs    ?CBC: ?Recent Labs  ?Lab 11/30/21 ?1707 11/30/21 ?0109 11/30/21 ?2324 12/01/21 ?3235 12/03/21 ?5732  ?WBC 12.8*  --  15.5* 14.0* 9.4  ?NEUTROABS 10.6*  --   --   --   --   ?HGB 11.3* 10.9* 10.9* 10.8* 8.7*  ?HCT 34.2* 32.0* 32.2* 31.0* 26.0*  ?MCV 79.4*  --  78.3* 77.7* 78.3*  ?PLT 87*  --  107* 91* 128*  ? ? ? ?Basic Metabolic Panel: ?Recent Labs  ?Lab 11/30/21 ?1707 11/30/21 ?2025 11/30/21 ?2324 12/01/21 ?4270 12/02/21 ?6237 12/02/21 ?6283 12/03/21 ?1517  ?NA 130* 132* 129* 129* 129*  --  130*  ?K 3.5 3.5 3.0* 3.1* 2.8* 3.2* 2.8*  ?CL 92* 97* 91* 89* 91*  --  94*  ?CO2 22  --  22 23 22   --  23  ?GLUCOSE 157* 147* 162* 177* 124*  --  97  ?BUN 50* 44* 53* 55* 63*  --  68*  ?CREATININE 5.62* 6.50* 6.33* 6.19* 7.84*  --  7.57*  ?CALCIUM 9.6  --  9.5 9.3 8.9  --  8.7*  ?MG  --   --  2.2 2.3  --   --   --   ?PHOS  --   --  4.8* 5.8*  --   --   --   ? ? ?GFR: ?Estimated Creatinine Clearance: 12.3 mL/min (A) (by C-G formula based on SCr of 7.57 mg/dL (H)). ?Recent Labs  ?Lab 11/30/21 ?1707 11/30/21 ?2324 12/01/21 ?0251 12/03/21 ?0419  ?WBC 12.8* 15.5* 14.0* 9.4  ?LATICACIDVEN  --  2.0*  --   --   ? ? ? ?Liver Function Tests: ?Recent Labs  ?Lab 11/30/21 ?2324 12/02/21 ?0305 12/03/21 ?0419  ?AST 27 15 15   ?ALT 13 11 9   ?ALKPHOS 104 99 89  ?BILITOT 0.8 0.4 0.5  ?PROT 9.1* 7.9 7.5  ?ALBUMIN 3.7 3.2* 3.0*  ? ? ?No results for input(s): LIPASE, AMYLASE in the last 168 hours. ?No results for input(s): AMMONIA in the last 168 hours. ? ?ABG ?   ?Component Value Date/Time  ? TCO2 23 11/30/2021 1842  ? ?  ? ?Coagulation Profile: ?Recent Labs  ?Lab 11/30/21 ?6160  ?INR 1.2  ? ? ? ?Cardiac Enzymes: ?Recent Labs  ?Lab 11/30/21 ?2324  ?CKTOTAL 50  ? ? ? ?HbA1C: ?Hgb A1c MFr Bld  ?Date/Time Value Ref Range Status  ?10/10/2021 05:58 PM 4.7 (L) 4.8 - 5.6 % Final  ?  Comment:  ?  (NOTE) ?Pre diabetes:          5.7%-6.4% ? ?Diabetes:              >6.4% ? ?Glycemic control for   <7.0% ?adults with diabetes ?  ? ? ?CBG: ?No results for  input(s): GLUCAP in the last 168 hours. ? ?Critical care time: 33 minutes ?  ? ?CRITICAL CARE ?Performed by: Otilio Carpen Osman Calzadilla ? ? ?Total critical care time: 33 minutes ? ?Critical care time was exclusive of separately billable procedures and treating other patients. ? ?Critical care was necessary to treat or prevent imminent or life-threatening deterioration. ? ?Critical care was time spent personally by me on the following  activities: development of treatment plan with patient and/or surrogate as well as nursing, discussions with consultants, evaluation of patient's response to treatment, examination of patient, obtaining history from patient or surrogate, ordering and performing treatments and interventions, ordering and review of laboratory studies, ordering and review of radiographic studies, pulse oximetry and re-evaluation of patient's condition. ? ? ?Otilio Carpen Coraima Tibbs, PA-C ?[facility] ?See Amion for pager ?If no response to pager , please call [phone] until 7pm ?After 7:00 pm call Elink  [phone] ? ? ? ? ? ? ? ?

## 2021-12-03 NOTE — Progress Notes (Signed)
?  Transition of Care (TOC) Screening Note ? ? ?Patient Details  ?Name: Quanta L Lenk ?Date of Birth: 1976-02-14 ? ? ?Transition of Care (TOC) CM/SW Contact:    ?Benard Halsted, LCSW ?Phone Number: ?12/03/2021, 8:32 AM ? ? ? ?Transition of Care Department [facility]) has reviewed patient and no TOC needs have been identified at this time. Patient has applied for [place] Medicaid (moved here from [place] a year ago) and has followed up with [employer] and [employer]. We will continue to monitor patient advancement through interdisciplinary progression rounds. If new patient transition needs arise, please place a TOC consult. ? ? ?

## 2021-12-03 NOTE — Telephone Encounter (Signed)
Discussed with patient about having sleep study done in order to evaluate him for sleep apnea in regards to his resistant hypertension. I have placed order for outpatient sleep study. If he has sleep apnea based on test results, we will then schedule him for follow up with one of our sleep specialists.  ? ?Freda Jackson, MD ?[facility] ?Office: [phone] ? ? ?See Amion for personal pager ?PCCM on call pager [phone] until 7pm. ?Please call Elink 7p-7a. [phone] ? ?

## 2021-12-03 NOTE — Consult Note (Addendum)
Cardiology Consultation:   Patient ID: KENDELL SAGRAVES MRN: [id number]; DOB: 1976/06/18  Admit date: 11/30/2021 Date of Consult: 12/03/2021  PCP:  Ladell Pier, MD   [facility] Cardiologist:  Pixie Casino, MD     Patient Profile:   Roy Randolph is a 46 y.o. male with a hx of malignant HTN, tobacco use, THC use, ETOH use and spontaneous pneumothorax on the right who is being seen 12/03/2021 for the evaluation of hypertension at the request of Dr. Erin Fulling.  History of Present Illness:   Roy Randolph is a 46 yo male with PMH noted above. He was elevated by [facility] back in January for evaluation of near syncope.  It was noted he was in hypertensive emergency during on admission.  Also reported to have a history of EtOH use.  Echocardiogram 10/11/2021 showed EF of 55%, mild LVH, grade 2 diastolic dysfunction with no significant valvular disease.  Renal ultrasound without acute finding.  He was discharged on amlodipine 10 mg daily, Coreg 25 mg twice daily, hydralazine 100 mg 3 times daily, spironolactone 50 mg daily and clonidine 0.1 twice daily.  Aldosterone/renin ratio was WNL. Of note his creatinine was noted at 2.3 on admission, did have transient improvement but was noted at 2.4 on discharge.  He was instructed to follow-up with nephrology as an outpatient.   He presented to the ED on 3/3 with complaints of headache and visual changes.  Over the past 2 weeks symptoms gradually worsened prompting visit to the ED.  Stated he did follow-up with PCP after discharge and was referred to nephrology due to worsening renal function and reported seeing last week.  Did report he stopped taking coreg and hydralazine PTA as he thought this was contributing to pruritis.   In the ED his labs showed sodium 130, potassium 3.5, creatinine 5.6, high-sensitivity troponin 104 WBC 12.8, hemoglobin 11.3.  Respiratory panel negative.  EKG showed sinus tachycardia, 105 bpm, LVH, nonspecific  changes.  Blood pressure was noted 223/156 with a MAP of 175.  He was given labetalol and started on Cleviprex.  Underwent head CT which showed hypodensities in bilateral occipital lobes and right cerebellum concerning for PRES. he was admitted to [facility] and neurology consulted.  It was recommended to continue on Cleviprex to reduce MAP gradually as well as slowly reintroduce home medications.  Also evaluated by nephrology who noted he was high risk for progression to dialysis.  Instructed to add back home medications starting with amlodipine, then carvedilol, chlorthalidone, hydralazine.  Cleviprex was transitioned to Cardene given elevated triglycerides.  Coreg 12.5 mg twice daily was added on 3/5.  Blood pressures remained elevated and was transition from carvedilol to labetalol 200 mg twice daily.    Creatine continued to rise, peaking at 7.84, though UOP has been good.   Cardiology now asked to evaluate for further recommendations regarding outpatient BP management.  In talking with the patient he has seen his PCP as an outpatient as well as made it to his first nephrology visit. He was not taking his BPs at home, needs a cuff. He was scheduled to see cardiology in the office 3/8.    Past Medical History:  Diagnosis Date   Alcohol use    Hypertension    Noncompliance    Smoking     Past Surgical History:  Procedure Laterality Date   THORACOTOMY Right      Home Medications:  Prior to Admission medications   Medication Sig Start Date  End Date Taking? Authorizing Provider  acetaminophen (TYLENOL) 325 MG tablet Take 325-650 mg by mouth every 6 (six) hours as needed for mild pain or headache.   Yes [provider]  amLODipine (NORVASC) 10 MG tablet Take 1 tablet (10 mg total) by mouth daily. 11/12/21  Yes Ladell Pier, MD  Ascorbic Acid (VITAMIN C PO) Take 1 tablet by mouth daily.   Yes [provider]  butalbital-acetaminophen-caffeine (FIORICET) 50-325-40 MG tablet  Take 1 tablet by mouth every 4 (four) hours as needed for headache or migraine. 10/15/21  Yes Lavina Hamman, MD  CALCIUM PO Take 1 tablet by mouth daily.   Yes [provider]  methocarbamol (ROBAXIN) 500 MG tablet Take 1 tablet (500 mg total) by mouth daily as needed for muscle spasms. 11/12/21  Yes Ladell Pier, MD  pantoprazole (PROTONIX) 40 MG tablet Take 1 tablet (40 mg total) by mouth daily. 11/12/21  Yes Ladell Pier, MD  spironolactone (ALDACTONE) 50 MG tablet Take 1 tablet (50 mg total) by mouth daily. 11/12/21  Yes Ladell Pier, MD  SUMAtriptan (IMITREX) 25 MG tablet 1 tab at start of headache.May repeat in 2 hours if headache persists or recurs. Max 2 tabs/24 hrs Patient taking differently: Take 25 mg by mouth See admin instructions. Take 25 mg by mouth at the start of a headache and may repeat in 2 hours if headache persists or recurs. Max 2 tabs/24 hrs. 11/12/21  Yes Ladell Pier, MD  amitriptyline (ELAVIL) 25 MG tablet Take 1 tablet (25 mg total) by mouth at bedtime. Patient not taking: Reported on 11/30/2021 11/12/21   Ladell Pier, MD  B Complex-C (B-COMPLEX WITH VITAMIN C) tablet Take 1 tablet by mouth daily. Patient not taking: Reported on 11/30/2021 10/16/21   Lavina Hamman, MD  carvedilol (COREG) 25 MG tablet Take 1 tablet (25 mg total) by mouth 2 (two) times daily with a meal. Patient not taking: Reported on 11/30/2021 10/15/21   Lavina Hamman, MD  cloNIDine (CATAPRES) 0.1 MG tablet Take 0.1 mg by mouth 2 (two) times daily. Patient not taking: Reported on 11/30/2021    [provider]  cyanocobalamin 1000 MCG tablet Take 1 tablet (1,000 mcg total) by mouth daily. Patient not taking: Reported on 11/30/2021 11/12/21   Ladell Pier, MD  hydrALAZINE (APRESOLINE) 50 MG tablet Take 1 tablet (50 mg total) by mouth 3 (three) times daily. Patient not taking: Reported on 11/30/2021 11/12/21   Ladell Pier, MD    Inpatient  Medications: Scheduled Meds:  amLODipine  10 mg Oral Daily   aspirin EC  81 mg Oral Daily   Chlorhexidine Gluconate Cloth  6 each Topical Daily   cloNIDine  0.1 mg Oral BID   heparin injection (subcutaneous)  5,000 Units Subcutaneous Q8H   labetalol  200 mg Oral BID   multivitamin  1 tablet Oral QHS   pantoprazole  40 mg Oral Daily   potassium chloride  20 mEq Oral Once   Continuous Infusions:  niCARDipine Stopped (12/03/21 1018)   PRN Meds: diphenhydrAMINE, labetalol, LORazepam  Allergies:    Allergies  Allergen Reactions   Cucumber Extract Anaphylaxis and Itching   Fruit Extracts Anaphylaxis, Itching, Swelling and Other (See Comments)    The patient CANNOT eat raw/fresh fruit or vegetables. Reaction starts with ears itching, then progresses from there. He can eat canned versions of these foods, however.   Other Anaphylaxis, Itching, Swelling and Other (See Comments)  The patient CANNOT eat raw/fresh fruit or vegetables. Reaction starts with ears itching, then progresses from there. He can eat canned versions of these foods, however.   Tomato (Diagnostic) Anaphylaxis and Swelling   Wild Lettuce [Lactuca Virosa] Anaphylaxis and Swelling   Hydralazine Hives, Itching and Other (See Comments)    SEVERE ITCHING    Social History:   Social History   Socioeconomic History   Marital status: Single    Spouse name: Not on file   Number of children: Not on file   Years of education: Not on file   Highest education level: Not on file  Occupational History   Not on file  Tobacco Use   Smoking status: Every Day    Packs/day: 0.30    Types: Cigarettes    Start date: 1998   Smokeless tobacco: Not on file  Substance and Sexual Activity   Alcohol use: Not Currently    Alcohol/week: 12.0 standard drinks    Types: 12 Cans of beer per week    Comment: stopped heavy drinking 2021   Drug use: Yes    Types: Marijuana   Sexual activity: Not on file  Other Topics Concern   Not on  file  Social History Narrative   Not on file   Social Determinants of Health   Financial Resource Strain: Not on file  Food Insecurity: Not on file  Transportation Needs: Not on file  Physical Activity: Not on file  Stress: Not on file  Social Connections: Not on file  Intimate Partner Violence: Not on file    Family History:    Family History  Problem Relation Age of Onset   Hypertension Mother    Cervical cancer Mother    Breast cancer Sister    Sudden Cardiac Death Brother 61   Heart disease Maternal Grandmother    Cancer Maternal Grandmother      ROS:  Please see the history of present illness.   All other ROS reviewed and negative.     Physical Exam/Data:   Vitals:   12/03/21 0845 12/03/21 0900 12/03/21 0915 12/03/21 1146  BP: (!) 149/102 (!) 152/101 (!) 158/109   Pulse: 82 86 86   Resp: 18 (!) 26 20   Temp:    97.8 F (36.6 C)  TempSrc:    Oral  SpO2: 98% (!) 88% (!) 86%   Weight:      Height:        Intake/Output Summary (Last 24 hours) at 12/03/2021 1224 Last data filed at 12/03/2021 0900 Gross per 24 hour  Intake 975.97 ml  Output 3600 ml  Net -2624.03 ml   Last 3 Weights 11/30/2021 11/30/2021 11/12/2021  Weight (lbs) 154 lb 1.6 oz 167 lb 8.8 oz 169 lb  Weight (kg) 69.9 kg 76 kg 76.658 kg     Body mass index is 22.76 kg/m.  General:  Well nourished, well developed, young AAM, sitting up in bed. HEENT: normal Neck: no JVD Vascular: No carotid bruits; Distal pulses 2+ bilaterally Cardiac:  normal S1, S2; RRR; no murmur  Lungs:  clear to auscultation bilaterally, no wheezing, rhonchi or rales  Abd: soft, nontender, no hepatomegaly  Ext: no edema Musculoskeletal:  No deformities, BUE and BLE strength normal and equal Skin: warm and dry  Neuro:  CNs 2-12 intact, no focal abnormalities noted Psych:  Normal affect   EKG:  The EKG was personally reviewed and demonstrates: Sinus tachycardia, 105 bpm, LVH, nonspecific changes    Relevant CV  Studies:  Echo: 09/2021  IMPRESSIONS     1. Left ventricular ejection fraction, by estimation, is 55%. The left  ventricle has normal function. The left ventricle has no regional wall  motion abnormalities. There is mild left ventricular hypertrophy. Left  ventricular diastolic parameters are  consistent with Grade II diastolic dysfunction (pseudonormalization).   2. Right ventricular systolic function is normal. The right ventricular  size is normal. Tricuspid regurgitation signal is inadequate for assessing  PA pressure.   3. Left atrial size was moderately dilated.   4. The mitral valve is normal in structure. Trivial mitral valve  regurgitation. No evidence of mitral stenosis.   5. The aortic valve is tricuspid. Aortic valve regurgitation is trivial.  No aortic stenosis is present.   6. The inferior vena cava is normal in size with greater than 50%  respiratory variability, suggesting right atrial pressure of 3 mmHg.   FINDINGS   Left Ventricle: Left ventricular ejection fraction, by estimation, is  55%. The left ventricle has normal function. The left ventricle has no  regional wall motion abnormalities. The left ventricular internal cavity  size was normal in size. There is mild  left ventricular hypertrophy. Left ventricular diastolic parameters are  consistent with Grade II diastolic dysfunction (pseudonormalization).   Right Ventricle: The right ventricular size is normal. No increase in  right ventricular wall thickness. Right ventricular systolic function is  normal. Tricuspid regurgitation signal is inadequate for assessing PA  pressure.   Left Atrium: Left atrial size was moderately dilated.   Right Atrium: Right atrial size was normal in size.   Pericardium: There is no evidence of pericardial effusion.   Mitral Valve: The mitral valve is normal in structure. Trivial mitral  valve regurgitation. No evidence of mitral valve stenosis. MV peak  gradient, 45.4  mmHg. The mean mitral valve gradient is 18.0 mmHg.   Tricuspid Valve: The tricuspid valve is normal in structure. Tricuspid  valve regurgitation is not demonstrated.   Aortic Valve: The aortic valve is tricuspid. Aortic valve regurgitation is  trivial. Aortic regurgitation PHT measures 1460 msec. No aortic stenosis  is present. Aortic valve mean gradient measures 4.0 mmHg. Aortic valve  peak gradient measures 7.7 mmHg.  Aortic valve area, by VTI measures 3.25 cm.   Pulmonic Valve: The pulmonic valve was normal in structure. Pulmonic valve  regurgitation is not visualized.   Aorta: The aortic root is normal in size and structure.   Venous: The inferior vena cava is normal in size with greater than 50%  respiratory variability, suggesting right atrial pressure of 3 mmHg.   IAS/Shunts: No atrial level shunt detected by color flow Doppler.   Laboratory Data:  High Sensitivity Troponin:   Recent Labs  Lab 11/30/21 1707 11/30/21 2051  TROPONINIHS 104* 127*     Chemistry Recent Labs  Lab 11/30/21 2324 12/01/21 0251 12/02/21 0305 12/02/21 0950 12/03/21 0419  NA 129* 129* 129*  --  130*  K 3.0* 3.1* 2.8* 3.2* 2.8*  CL 91* 89* 91*  --  94*  CO2 22 23 22   --  23  GLUCOSE 162* 177* 124*  --  97  BUN 53* 55* 63*  --  68*  CREATININE 6.33* 6.19* 7.84*  --  7.57*  CALCIUM 9.5 9.3 8.9  --  8.7*  MG 2.2 2.3  --   --  2.0  GFRNONAA 10* 11* 8*  --  8*  ANIONGAP 16* 17* 16*  --  13  Recent Labs  Lab 11/30/21 2324 12/02/21 0305 12/03/21 0419  PROT 9.1* 7.9 7.5  ALBUMIN 3.7 3.2* 3.0*  AST 27 15 15   ALT 13 11 9   ALKPHOS 104 99 89  BILITOT 0.8 0.4 0.5   Lipids  Recent Labs  Lab 12/02/21 0305  TRIG 370*    Hematology Recent Labs  Lab 11/30/21 2324 12/01/21 0251 12/03/21 0419  WBC 15.5* 14.0* 9.4  RBC 4.11* 3.99* 3.32*  HGB 10.9* 10.8* 8.7*  HCT 32.2* 31.0* 26.0*  MCV 78.3* 77.7* 78.3*  MCH 26.5 27.1 26.2  MCHC 33.9 34.8 33.5  RDW 17.0* 17.0* 17.1*  PLT 107*  91* 128*   Thyroid No results for input(s): TSH, FREET4 in the last 168 hours.  BNPNo results for input(s): BNP, PROBNP in the last 168 hours.  DDimer No results for input(s): DDIMER in the last 168 hours.   Radiology/Studies:  DG Chest 2 View  Result Date: 11/30/2021 CLINICAL DATA:  Provided history: Chest pain. EXAM: CHEST - 2 VIEW COMPARISON:  Chest radiographs 10/10/2021. FINDINGS: Heart size within normal limits. Focus of linear atelectasis versus scarring within the right lung base. No appreciable airspace consolidation. No evidence of pleural effusion or pneumothorax. No acute bony abnormality identified. IMPRESSION: Focus of linear atelectasis versus scarring within the lateral right lung base. Otherwise, no evidence of acute cardiopulmonary abnormality. Electronically Signed   By: Kellie Simmering D.O.   On: 11/30/2021 17:30   CT HEAD WO CONTRAST (5MM)  Result Date: 11/30/2021 CLINICAL DATA:  Provided history: Diplopia, headache, blurred vision, hypertension. EXAM: CT HEAD WITHOUT CONTRAST TECHNIQUE: Contiguous axial images were obtained from the base of the skull through the vertex without intravenous contrast. RADIATION DOSE REDUCTION: This exam was performed according to the departmental dose-optimization program which includes automated exposure control, adjustment of the mA and/or kV according to patient size and/or use of iterative reconstruction technique. COMPARISON:  Head CT 10/10/2021. FINDINGS: Brain: Cerebral volume is normal. There is symmetric abnormal cortical/subcortical hypodensity within the bilateral occipital lobes. Additionally, there is apparent mild abnormal hypodensity within the right cerebellar hemisphere (for instance as seen on series 2, image 6). There is no acute intracranial hemorrhage. No extra-axial fluid collection. No evidence of an intracranial mass. No midline shift. Vascular: No hyperdense vessel. Skull: Normal. Negative for fracture or focal lesion.  Sinuses/Orbits: Visualized orbits show no acute finding. No significant paranasal sinus disease at the imaged levels. These results were called by telephone at the time of interpretation on 11/30/2021 at 5:40 pm to provider Dr. Roderic Palau, who verbally acknowledged these results. IMPRESSION: Symmetric abnormal cortical/subcortical hypodensity within the bilateral occipital lobes. Additionally, there is apparent mild abnormal hypodensity within the right cerebellar hemisphere. 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 A brain MRI is recommended for further evaluation and to exclude superimposed acute infarcts at these sites. Electronically Signed   By: Kellie Simmering D.O.   On: 11/30/2021 17:42   MR BRAIN WO CONTRAST  Result Date: 11/30/2021 CLINICAL DATA:  Encephalopathy EXAM: MRI HEAD WITHOUT CONTRAST TECHNIQUE: Multiplanar, multiecho pulse sequences of the brain and surrounding structures were obtained without intravenous contrast. COMPARISON:  Head CT 11/30/2021 and 10/10/2021 FINDINGS: Brain: No acute infarct, mass effect or extra-axial collection. Multifocal chronic microhemorrhage within both occipital lobes and the cerebellum. There is hyperintense T2-weighted signal within the cerebellum and both occipital lobes. No abnormal volume loss. The midline structures are normal. Vascular: Major  flow voids are preserved. Skull and upper cervical spine: Normal calvarium and skull base. Visualized upper cervical spine and soft tissues are normal. Sinuses/Orbits:No paranasal sinus fluid levels or advanced mucosal thickening. No mastoid or middle ear effusion. Normal orbits. IMPRESSION: 1. No acute intracranial abnormality. 2. Hyperintense T2-weighted signal and multifocal chronic microhemorrhage within both occipital lobes and cerebellar hemispheres. Given history of malignant hypertension, these  findings are favored to be sequelae of a prior episode of PRES. Gliosis related to a prior traumatic injury is a less likely alternative possibility. Electronically Signed   By: Ulyses Jarred M.D.   On: 11/30/2021 23:22     Assessment and Plan:   Roy Randolph is a 46 y.o. male with a hx of malignant HTN, tobacco use, THC use, ETOH use and spontaneous pneumothorax on the right who is being seen 12/03/2021 for the evaluation of hypertension at the request of Dr. Erin Fulling.  Malignant HTN with associated PRES syndrome: presented with headache and vision changes. CT head consistent with PRES. Initial BPs >299/242 systolic. Started on cleviprex, then transitioned to Cardene. He has currently been weaned from IV cardene -- initially coreg 25mg  BID, switched this morning to labetalol 200mg  BID, amlodipine 10mg  daily. Consider further increase in labetalol to 300mg  BID if blood pressures remain elevated for morning dose.  -- resumed on clonidine 0.1mg  BID -- unable to add ACE/ARB/spiro in the setting of worsening renal function. Has been off hydralazine due to complaints of itching. -- discussed follow up in the HTN clinic at discharge, though suspect nephrology with likely manage in the future if he progresses to HD  CKD stage IV/V: prior to this admission baseline Cr about 2.5, now peaking at 7.8 this admission. Noted high risk for progression to HD -- nephrology following, considering placement of HD cath this admission  Polysubstance abuse: cessation advised -- + for opiates, THC on admission  Noncompliance: does have hx of same, but reports he is working to get things back on track. Has not established with PCP and nephrology as an outpatient. Cardiology follow up to be arranged. He is getting his medicaid transferred from [place] to [place].      For questions or updates, please contact [place] Please consult [email or web address] for contact info under    Signed, Reino Bellis, NP  12/03/2021 12:24  PM

## 2021-12-03 NOTE — Progress Notes (Signed)
[facility] ?ROUNDING NOTE  ? ?Subjective:  ? ?Interval History: This is a 46 year old gentleman with a history of stage IV chronic kidney disease with malignant hypertension and press syndrome. ? ?Blood pressure 166/101 pulse 83 temperature 98 O2 sats 92.  Urine output 3.7 L 12/02/2021 ? ?Sodium 130 potassium 2.8 chloride 94 CO2 23 BUN 68 creatinine 7.57 glucose 97 albumin 3 hemoglobin 8.7 ? ?Objective:  ?Vital signs in last 24 hours:  ?Temp:  [98 ?F (36.7 ?C)-98.7 ?F (37.1 ?C)] 98 ?F (36.7 ?C) (03/06 0753) ?Pulse Rate:  [78-98] 86 (03/06 0745) ?Resp:  [10-40] 20 (03/06 0745) ?BP: (133-179)/(71-130) 173/122 (03/06 0745) ?SpO2:  [91 %-100 %] 99 % (03/06 0745) ? ?Weight change:  ?Filed Weights  ? 11/30/21 2252 11/30/21 2312  ?Weight: 76 kg 69.9 kg  ? ? ?Intake/Output: ?I/O last 3 completed shifts: ?In: 2408.5 [P.O.:1220; I.V.:1188.5] ?Out: 9702 [Urine:5700] ?  ?Intake/Output this shift: ? No intake/output data recorded. ? ?CVS- RRR ?RS- CTA ?ABD- BS present soft non-distended ?EXT- no edema ? ? ?Basic Metabolic Panel: ?Recent Labs  ?Lab 11/30/21 ?1707 11/30/21 ?6378 11/30/21 ?2324 12/01/21 ?5885 12/02/21 ?0277 12/02/21 ?4128 12/03/21 ?7867  ?NA 130* 132* 129* 129* 129*  --  130*  ?K 3.5 3.5 3.0* 3.1* 2.8* 3.2* 2.8*  ?CL 92* 97* 91* 89* 91*  --  94*  ?CO2 22  --  22 23 22   --  23  ?GLUCOSE 157* 147* 162* 177* 124*  --  97  ?BUN 50* 44* 53* 55* 63*  --  68*  ?CREATININE 5.62* 6.50* 6.33* 6.19* 7.84*  --  7.57*  ?CALCIUM 9.6  --  9.5 9.3 8.9  --  8.7*  ?MG  --   --  2.2 2.3  --   --   --   ?PHOS  --   --  4.8* 5.8*  --   --   --   ? ? ?Liver Function Tests: ?Recent Labs  ?Lab 11/30/21 ?2324 12/02/21 ?0305 12/03/21 ?0419  ?AST 27 15 15   ?ALT 13 11 9   ?ALKPHOS 104 99 89  ?BILITOT 0.8 0.4 0.5  ?PROT 9.1* 7.9 7.5  ?ALBUMIN 3.7 3.2* 3.0*  ? ?No results for input(s): LIPASE, AMYLASE in the last 168 hours. ?No results for input(s): AMMONIA in the last 168 hours. ? ?CBC: ?Recent Labs  ?Lab 11/30/21 ?1707  11/30/21 ?6720 11/30/21 ?2324 12/01/21 ?9470 12/03/21 ?9628  ?WBC 12.8*  --  15.5* 14.0* 9.4  ?NEUTROABS 10.6*  --   --   --   --   ?HGB 11.3* 10.9* 10.9* 10.8* 8.7*  ?HCT 34.2* 32.0* 32.2* 31.0* 26.0*  ?MCV 79.4*  --  78.3* 77.7* 78.3*  ?PLT 87*  --  107* 91* 128*  ? ? ?Cardiac Enzymes: ?Recent Labs  ?Lab 11/30/21 ?2324  ?CKTOTAL 50  ? ? ?BNP: ?Invalid input(s): POCBNP ? ?CBG: ?No results for input(s): GLUCAP in the last 168 hours. ? ?Microbiology: ?Results for orders placed or performed during the hospital encounter of 11/30/21  ?Resp Panel by RT-PCR (Flu A&B, Covid) Nasopharyngeal Swab     Status: None  ? Collection Time: 11/30/21  5:12 PM  ? Specimen: Nasopharyngeal Swab; Nasopharyngeal(NP) swabs in vial transport medium  ?Result Value Ref Range Status  ? SARS Coronavirus 2 by RT PCR NEGATIVE NEGATIVE Final  ?  Comment: (NOTE) ?SARS-CoV-2 target nucleic acids are NOT DETECTED. ? ?The SARS-CoV-2 RNA is generally detectable in upper respiratory ?specimens during the acute phase of infection. The lowest ?  concentration of SARS-CoV-2 viral copies this assay can detect is ?138 copies/mL. A negative result does not preclude SARS-Cov-2 ?infection and should not be used as the sole basis for treatment or ?other patient management decisions. A negative result may occur with  ?improper specimen collection/handling, submission of specimen other ?than nasopharyngeal swab, presence of viral mutation(s) within the ?areas targeted by this assay, and inadequate number of viral ?copies(<138 copies/mL). A negative result must be combined with ?clinical observations, patient history, and epidemiological ?information. The expected result is Negative. ? ?Fact Sheet for Patients:  ?EntrepreneurPulse.com.au ? ?Fact Sheet for Healthcare Providers:  ?IncredibleEmployment.be ? ?This test is no t yet approved or cleared by the [place] FDA and  ?has been authorized for detection and/or diagnosis of  SARS-CoV-2 by ?FDA under an Emergency Use Authorization (EUA). This EUA will remain  ?in effect (meaning this test can be used) for the duration of the ?COVID-19 declaration under Section 564(b)(1) of the Act, 21 ?U.S.C.section 360bbb-3(b)(1), unless the authorization is terminated  ?or revoked sooner.  ? ? ?  ? Influenza A by PCR NEGATIVE NEGATIVE Final  ? Influenza B by PCR NEGATIVE NEGATIVE Final  ?  Comment: (NOTE) ?The Xpert Xpress SARS-CoV-2/FLU/RSV plus assay is intended as an aid ?in the diagnosis of influenza from Nasopharyngeal swab specimens and ?should not be used as a sole basis for treatment. Nasal washings and ?aspirates are unacceptable for Xpert Xpress SARS-CoV-2/FLU/RSV ?testing. ? ?Fact Sheet for Patients: ?EntrepreneurPulse.com.au ? ?Fact Sheet for Healthcare Providers: ?IncredibleEmployment.be ? ?This test is not yet approved or cleared by the [place] FDA and ?has been authorized for detection and/or diagnosis of SARS-CoV-2 by ?FDA under an Emergency Use Authorization (EUA). This EUA will remain ?in effect (meaning this test can be used) for the duration of the ?COVID-19 declaration under Section 564(b)(1) of the Act, 21 U.S.C. ?section 360bbb-3(b)(1), unless the authorization is terminated or ?revoked. ? ?Performed at [facility], [address] ?[place], [place] [address] ?  ?MRSA Next Gen by PCR, Nasal     Status: None  ? Collection Time: 11/30/21 11:11 PM  ? Specimen: Nasal Mucosa; Nasal Swab  ?Result Value Ref Range Status  ? MRSA by PCR Next Gen NOT DETECTED NOT DETECTED Final  ?  Comment: (NOTE) ?The GeneXpert MRSA Assay (FDA approved for NASAL specimens only), ?is one component of a comprehensive MRSA colonization surveillance ?program. It is not intended to diagnose MRSA infection nor to guide ?or monitor treatment for MRSA infections. ?Test performance is not FDA approved in patients less than 2 years ?old. ?Performed at  [facility], [place] [address] [place], [place] ?[address] ?  ? ? ?Coagulation Studies: ?Recent Labs  ?  11/30/21 ?1748  ?LABPROT 15.4*  ?INR 1.2  ? ? ?Urinalysis: ?Recent Labs  ?  11/30/21 ?2108  ?COLORURINE YELLOW  ?LABSPEC 1.017  ?PHURINE 5.0  ?GLUCOSEU NEGATIVE  ?HGBUR MODERATE*  ?BILIRUBINUR NEGATIVE  ?KETONESUR NEGATIVE  ?PROTEINUR >=300*  ?NITRITE NEGATIVE  ?LEUKOCYTESUR NEGATIVE  ?  ? ? ?Imaging: ?No results found. ? ? ?Medications:  ? ? niCARDipine 5 mg/hr (12/03/21 0750)  ? ? amLODipine  10 mg Oral Daily  ? aspirin EC  81 mg Oral Daily  ? carvedilol  25 mg Oral BID WC  ? Chlorhexidine Gluconate Cloth  6 each Topical Daily  ? cloNIDine  0.1 mg Oral BID  ? heparin injection (subcutaneous)  5,000 Units Subcutaneous Q8H  ? multivitamin  1 tablet Oral QHS  ?  pantoprazole  40 mg Oral Daily  ? potassium chloride  20 mEq Oral Once  ? ?diphenhydrAMINE, labetalol, LORazepam ? ?Assessment/ Plan:  ?Stage IV/V acute on chronic kidney disease.  With accelerated hypertension high risk to progression to dialysis. 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  Blood pressure still elevated with change carvedilol to labetalol 200 mg twice daily ?Borderline nephrotic range proteinuria. ?Very high risk for progression to end-stage renal disease probably will need dialysis and dialysis catheter placed during hospitalization ? ? LOS: 3 ?Sherril Croon ?@TODAY @8 :35 AM ?  ?

## 2021-12-04 DIAGNOSIS — N179 Acute kidney failure, unspecified: Secondary | ICD-10-CM

## 2021-12-04 DIAGNOSIS — N184 Chronic kidney disease, stage 4 (severe): Secondary | ICD-10-CM

## 2021-12-04 DIAGNOSIS — E871 Hypo-osmolality and hyponatremia: Secondary | ICD-10-CM

## 2021-12-04 LAB — BASIC METABOLIC PANEL
Anion gap: 12 (ref 5–15)
BUN: 72 mg/dL — ABNORMAL HIGH (ref 6–20)
CO2: 23 mmol/L (ref 22–32)
Calcium: 8.4 mg/dL — ABNORMAL LOW (ref 8.9–10.3)
Chloride: 95 mmol/L — ABNORMAL LOW (ref 98–111)
Creatinine, Ser: 7.21 mg/dL — ABNORMAL HIGH (ref 0.61–1.24)
GFR, Estimated: 9 mL/min — ABNORMAL LOW (ref >60)
Glucose, Bld: 132 mg/dL — ABNORMAL HIGH (ref 70–99)
Potassium: 3 mmol/L — ABNORMAL LOW (ref 3.5–5.1)
Sodium: 130 mmol/L — ABNORMAL LOW (ref 135–145)

## 2021-12-04 LAB — CBC
HCT: 23 % — ABNORMAL LOW (ref 39.0–52.0)
Hemoglobin: 7.9 g/dL — ABNORMAL LOW (ref 13.0–17.0)
MCH: 27.2 pg (ref 26.0–34.0)
MCHC: 34.3 g/dL (ref 30.0–36.0)
MCV: 79.3 fL — ABNORMAL LOW (ref 80.0–100.0)
Platelets: 140 10*3/uL — ABNORMAL LOW (ref 150–400)
RBC: 2.9 MIL/uL — ABNORMAL LOW (ref 4.22–5.81)
RDW: 17.3 % — ABNORMAL HIGH (ref 11.5–15.5)
WBC: 8 10*3/uL (ref 4.0–10.5)
nRBC: 0 % (ref 0.0–0.2)

## 2021-12-04 LAB — MAGNESIUM: Magnesium: 2 mg/dL (ref 1.7–2.4)

## 2021-12-04 MED ORDER — LABETALOL HCL 100 MG PO TABS
300.0000 mg | ORAL_TABLET | Freq: Two times a day (BID) | ORAL | Status: DC
Start: 2021-12-04 — End: 2021-12-07
  Administered 2021-12-04 – 2021-12-07 (×7): 300 mg via ORAL
  Filled 2021-12-04 (×7): qty 3

## 2021-12-04 MED ORDER — POTASSIUM CHLORIDE CRYS ER 20 MEQ PO TBCR
20.0000 meq | EXTENDED_RELEASE_TABLET | Freq: Once | ORAL | Status: AC
  Administered 2021-12-04: 20 meq via ORAL
  Filled 2021-12-04: qty 1

## 2021-12-04 MED ORDER — CLONIDINE HCL 0.2 MG PO TABS
0.2000 mg | ORAL_TABLET | Freq: Two times a day (BID) | ORAL | Status: DC
  Administered 2021-12-04 – 2021-12-05 (×3): 0.2 mg via ORAL
  Filled 2021-12-04 (×3): qty 1

## 2021-12-04 MED ORDER — POTASSIUM CHLORIDE CRYS ER 20 MEQ PO TBCR
40.0000 meq | EXTENDED_RELEASE_TABLET | Freq: Once | ORAL | Status: AC
  Administered 2021-12-04: 40 meq via ORAL
  Filled 2021-12-04: qty 2

## 2021-12-04 NOTE — Progress Notes (Addendum)
PROGRESS NOTE        PATIENT DETAILS Name: Roy Randolph Age: 46 y.o. Sex: male Date of Birth: 1975/11/14 Admit Date: 11/30/2021 Admitting Physician Samara Deist, MD OMB:[id number], [name], MD  Brief Summary: Patient is a 46 y.o.  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  Admitted to the [facility] started on Cardene infusion.  Upon stability-transfer to the [facility] service.  Significant Hospital events: 3/3>> presenting with headaches/visual problems-uncontrolled HTN-AKI-admit to ICU for Cardene infusion.  MRI suggestive of PRES. 3/7>> transfer to [facility]  Significant imaging studies: 3/3>> MRI brain: Findings suggestive of PRES. 3/6>> MRI abdomen: No adrenal mass, 1.5 cm complex cyst involving left kidney  Significant microbiology data: 3/3>> COVID/influenza PCR: Negative  Procedures: None  Consults:  PCCM, nephrology, neurology, cardiology  Subjective: Lying comfortably in bed-denies any chest pain or shortness of breath.  No headaches.  Says he is continues to have some issues with blurry vision-apparently this has been ongoing since his discharge from the hospital since January.  Compared to how he first presented to the ED on 3/3-his vision has improved and now is back to baseline.  Objective: Vitals: Blood pressure (!) 139/99, pulse 73, temperature 97.8 F (36.6 C), temperature source Oral, resp. rate 12, height 5\' 9"  (1.753 m), weight 69.9 kg, SpO2 (!) 89 %.   Exam: Gen Exam:Alert awake-not in any distress HEENT:atraumatic, normocephalic Chest: B/L clear to auscultation anteriorly CVS:S1S2 regular Abdomen:soft non tender, non distended Extremities:no edema Neurology: Non focal Skin: no  rash  Pertinent Labs/Radiology: CBC Latest Ref Rng & Units 12/04/2021 12/03/2021 12/01/2021  WBC 4.0 - 10.5 K/uL 8.0 9.4 14.0(H)  Hemoglobin 13.0 - 17.0 g/dL 7.9(L) 8.7(L) 10.8(L)  Hematocrit 39.0 - 52.0 % 23.0(L) 26.0(L) 31.0(L)  Platelets 150 - 400 K/uL 140(L) 128(L) 91(L)    Lab Results  Component Value Date   NA 130 (L) 12/04/2021   K 3.0 (L) 12/04/2021   CL 95 (L) 12/04/2021   CO2 23 12/04/2021      Assessment/Plan: AKI on CKD stage IV: AKI likely hemodynamically mediated-in the setting of uncontrolled hypertension.  Nephrology following-concerned that he may be progressing to ESRD.  Will await further recommendations-currently no acute indications to start RRT at this point.  Follow electrolytes/volume status closely.  Hypertensive emergency: Resolved-required Cardene infusion-blood pressure continues to fluctuate-see below.  Some concern that he may have secondary causes of hypertension-cardiology will see him in the outpatient setting for further continued work-up.  Currently nephrology following.  PRES: Due to above-headache resolved-had significant visual issues when he first presented-that has since resolved and he is now back to his baseline (blurry vision at baseline since discharge from the hospital in January.  HTN: Fluctuating-increase labetalol to 300 mg twice daily, increase clonidine to 0.2 mg twice daily-continue amlodipine.  Follow and adjust.  Microcytic anemia: Suspect related to CKD-defer Aranesp/IV iron to nephrology service.  Check anemia panel with a.m. labs.  Hyponatremia: Mild-probably due to impaired free water excretion in the setting of AKI.  Watch closely.  Hypokalemia: Replete and recheck.  Prior aldosterone/PRA ratio not consistent with primary hyperaldosteronism.  HFpEF: Volume status stable  ?  OSA: PCCM planning outpatient sleep  study.  Nutrition Status: Nutrition Problem: Inadequate oral intake Etiology: acute illness Signs/Symptoms: NPO  status Interventions: MVI, Refer to RD note for recommendations, Education  BMI: Estimated body mass index is 22.76 kg/m as calculated from the following:   Height as of this encounter: 5\' 9"  (1.753 m).   Weight as of this encounter: 69.9 kg.   Code status:   Code Status: Prior   DVT Prophylaxis: heparin injection 5,000 Units Start: 12/01/21 1000 Place and maintain sequential compression device Start: 11/30/21 2148   Family Communication: None at bedside   Disposition Plan: Status is: Inpatient Remains inpatient appropriate because: AKI-concern for progression to ESRD-not yet stable for discharge   Planned Discharge Destination:Home   Diet: Diet Order             Diet Heart Room service appropriate? Yes with Assist; Fluid consistency: Thin  Diet effective now                     Antimicrobial agents: Anti-infectives (From admission, onward)    None        MEDICATIONS: Scheduled Meds:  amLODipine  10 mg Oral Daily   aspirin EC  81 mg Oral Daily   Chlorhexidine Gluconate Cloth  6 each Topical Daily   cloNIDine  0.2 mg Oral BID   docusate sodium  100 mg Oral BID   heparin injection (subcutaneous)  5,000 Units Subcutaneous Q8H   labetalol  300 mg Oral BID   multivitamin  1 tablet Oral QHS   pantoprazole  40 mg Oral Daily   polyethylene glycol  17 g Oral QHS   potassium chloride  20 mEq Oral Once   Continuous Infusions: PRN Meds:.diphenhydrAMINE, labetalol, LORazepam   I have personally reviewed following labs and imaging studies  LABORATORY DATA: CBC: Recent Labs  Lab 11/30/21 1707 11/30/21 1842 11/30/21 2324 12/01/21 0251 12/03/21 0419 12/04/21 0333  WBC 12.8*  --  15.5* 14.0* 9.4 8.0  NEUTROABS 10.6*  --   --   --   --   --   HGB 11.3* 10.9* 10.9* 10.8* 8.7* 7.9*  HCT 34.2* 32.0* 32.2* 31.0* 26.0* 23.0*  MCV 79.4*  --  78.3* 77.7* 78.3* 79.3*  PLT 87*  --  107* 91* 128* 140*    Basic Metabolic Panel: Recent Labs  Lab  11/30/21 2324 12/01/21 0251 12/02/21 0305 12/02/21 0950 12/03/21 0419 12/04/21 0333  NA 129* 129* 129*  --  130* 130*  K 3.0* 3.1* 2.8* 3.2* 2.8* 3.0*  CL 91* 89* 91*  --  94* 95*  CO2 22 23 22   --  23 23  GLUCOSE 162* 177* 124*  --  97 132*  BUN 53* 55* 63*  --  68* 72*  CREATININE 6.33* 6.19* 7.84*  --  7.57* 7.21*  CALCIUM 9.5 9.3 8.9  --  8.7* 8.4*  MG 2.2 2.3  --   --  2.0 2.0  PHOS 4.8* 5.8*  --   --   --   --     GFR: Estimated Creatinine Clearance: 12.9 mL/min (A) (by C-G formula based on SCr of 7.21 mg/dL (H)).  Liver Function Tests: Recent Labs  Lab 11/30/21 2324 12/02/21 0305 12/03/21 0419  AST 27 15 15   ALT 13 11 9   ALKPHOS 104 99 89  BILITOT 0.8 0.4 0.5  PROT 9.1* 7.9 7.5  ALBUMIN 3.7 3.2* 3.0*   No results for input(s): LIPASE, AMYLASE in the last 168 hours. No results for input(s):  AMMONIA in the last 168 hours.  Coagulation Profile: Recent Labs  Lab 11/30/21 1748  INR 1.2    Cardiac Enzymes: Recent Labs  Lab 11/30/21 2324  CKTOTAL 50    BNP (last 3 results) No results for input(s): PROBNP in the last 8760 hours.  Lipid Profile: Recent Labs    12/02/21 0305  TRIG 370*    Thyroid Function Tests: No results for input(s): TSH, T4TOTAL, FREET4, T3FREE, THYROIDAB in the last 72 hours.  Anemia Panel: No results for input(s): VITAMINB12, FOLATE, FERRITIN, TIBC, IRON, RETICCTPCT in the last 72 hours.  Urine analysis:    Component Value Date/Time   COLORURINE YELLOW 11/30/2021 2108   APPEARANCEUR HAZY (A) 11/30/2021 2108   LABSPEC 1.017 11/30/2021 2108   PHURINE 5.0 11/30/2021 2108   GLUCOSEU NEGATIVE 11/30/2021 2108   HGBUR MODERATE (A) 11/30/2021 2108   BILIRUBINUR NEGATIVE 11/30/2021 2108   KETONESUR NEGATIVE 11/30/2021 2108   PROTEINUR >=300 (A) 11/30/2021 2108   NITRITE NEGATIVE 11/30/2021 2108   LEUKOCYTESUR NEGATIVE 11/30/2021 2108    Sepsis Labs: Lactic Acid, Venous    Component Value Date/Time   LATICACIDVEN 2.0  (HH) 11/30/2021 2324    MICROBIOLOGY: Recent Results (from the past 240 hour(s))  Resp Panel by RT-PCR (Flu A&B, Covid) Nasopharyngeal Swab     Status: None   Collection Time: 11/30/21  5:12 PM   Specimen: Nasopharyngeal Swab; Nasopharyngeal(NP) swabs in vial transport medium  Result Value Ref Range Status   SARS Coronavirus 2 by RT PCR NEGATIVE NEGATIVE Final    Comment: (NOTE) SARS-CoV-2 target nucleic acids are NOT DETECTED.  The SARS-CoV-2 RNA is generally detectable in upper respiratory specimens during the acute phase of infection. The lowest concentration of SARS-CoV-2 viral copies this assay can detect is 138 copies/mL. A negative result does not preclude SARS-Cov-2 infection and should not be used as the sole basis for treatment or other patient management decisions. A negative result may occur with  improper specimen collection/handling, submission of specimen other than nasopharyngeal swab, presence of viral mutation(s) within the areas targeted by this assay, and inadequate number of viral copies(<138 copies/mL). A negative result must be combined with clinical observations, patient history, and epidemiological information. The expected result is Negative.  Fact Sheet for Patients:  EntrepreneurPulse.com.au  Fact Sheet for Healthcare Providers:  IncredibleEmployment.be  This test is no t yet approved or cleared by the [place] FDA and  has been authorized for detection and/or diagnosis of SARS-CoV-2 by FDA under an Emergency Use Authorization (EUA). This EUA will remain  in effect (meaning this test can be used) for the duration of the COVID-19 declaration under Section 564(b)(1) of the Act, 21 U.S.C.section 360bbb-3(b)(1), unless the authorization is terminated  or revoked sooner.       Influenza A by PCR NEGATIVE NEGATIVE Final   Influenza B by PCR NEGATIVE NEGATIVE Final    Comment: (NOTE) The Xpert Xpress  SARS-CoV-2/FLU/RSV plus assay is intended as an aid in the diagnosis of influenza from Nasopharyngeal swab specimens and should not be used as a sole basis for treatment. Nasal washings and aspirates are unacceptable for Xpert Xpress SARS-CoV-2/FLU/RSV testing.  Fact Sheet for Patients: EntrepreneurPulse.com.au  Fact Sheet for Healthcare Providers: IncredibleEmployment.be  This test is not yet approved or cleared by the [place] FDA and has been authorized for detection and/or diagnosis of SARS-CoV-2 by FDA under an Emergency Use Authorization (EUA). This EUA will remain in effect (meaning this test can be used) for the  duration of the COVID-19 declaration under Section 564(b)(1) of the Act, 21 U.S.C. section 360bbb-3(b)(1), unless the authorization is terminated or revoked.  Performed at [facility], [place] [address], [place], [place] [address]   MRSA Next Gen by PCR, Nasal     Status: None   Collection Time: 11/30/21 11:11 PM   Specimen: Nasal Mucosa; Nasal Swab  Result Value Ref Range Status   MRSA by PCR Next Gen NOT DETECTED NOT DETECTED Final    Comment: (NOTE) The GeneXpert MRSA Assay (FDA approved for NASAL specimens only), is one component of a comprehensive MRSA colonization surveillance program. It is not intended to diagnose MRSA infection nor to guide or monitor treatment for MRSA infections. Test performance is not FDA approved in patients less than 44 years old. Performed at [facility], [place] [address], [place], [place] [address]     RADIOLOGY STUDIES/RESULTS: MR ABDOMEN WO CONTRAST  Result Date: 12/03/2021 CLINICAL DATA:  Hypertension, possible adrenal lesion EXAM: MRI ABDOMEN WITHOUT CONTRAST TECHNIQUE: Multiplanar multisequence MR imaging was performed without the administration of intravenous contrast. COMPARISON:  None. FINDINGS: Study is limited due to motion. Lower chest: Unremarkable.  Hepatobiliary: Liver is normal in size and contour with no suspicious mass visualized. Subcentimeter hyperintense T2 signal likely cyst in the left hepatic lobe. Gallbladder appears normal. No biliary ductal dilatation. Pancreas: No mass, inflammatory changes, or other parenchymal abnormality identified. Spleen:  Within normal limits in size and appearance. Adrenals/Urinary Tract: Adrenal mass identified. 1.5 cm mildly complex cyst in the left kidney with thin internal septations. 0.8 cm simple appearing cyst in the right kidney. No hydronephrosis. Stomach/Bowel: No evidence of bowel obstruction. Vascular/Lymphatic: No pathologically enlarged lymph nodes identified. No abdominal aortic aneurysm demonstrated. Other:  No ascites. Musculoskeletal: No suspicious bone lesions identified. IMPRESSION: 1. No adrenal mass or acute process identified. 2. 1.5 cm mildly complex cyst in the left kidney. Electronically Signed   By: Ofilia Neas M.D.   On: 12/03/2021 19:31     LOS: 4 days   Oren Binet, MD  [facility]    To contact the attending provider between 7A-7P or the covering provider during after hours 7P-7A, please log into the web site [email or web address] and access using universal [place] password for that web site. If you do not have the password, please call the hospital operator.  12/04/2021, 11:07 AM

## 2021-12-04 NOTE — Progress Notes (Signed)
[facility] ROUNDING NOTE   Subjective:   Interval History: This is a 46 year old gentleman with a history of stage IV chronic kidney disease with malignant hypertension and press syndrome.  Blood pressure 136/71 pulse 72 temperature 97.8 O2 sats 98% room air  Sodium 130 potassium 3 chloride 95 CO2 23 BUN 72 creatinine 7.2 glucose 132 calcium 8.4 hemoglobin 7.9  Objective:  Vital signs in last 24 hours:  Temp:  [97.8 F (36.6 C)-98.7 F (37.1 C)] 97.8 F (36.6 C) (03/07 0400) Pulse Rate:  [70-97] 73 (03/07 0600) Resp:  [11-33] 17 (03/07 0600) BP: (136-181)/(71-123) 136/71 (03/07 0600) SpO2:  [86 %-100 %] 89 % (03/07 0600)  Weight change:  Filed Weights   11/30/21 2252 11/30/21 2312  Weight: 76 kg 69.9 kg    Intake/Output: I/O last 3 completed shifts: In: 1218.7 [P.O.:1000; I.V.:218.7] Out: 4926 [TMAUQ:3335]   Intake/Output this shift:  No intake/output data recorded.  CVS- RRR RS- CTA ABD- BS present soft non-distended EXT- no edema   Basic Metabolic Panel: Recent Labs  Lab 11/30/21 2324 12/01/21 0251 12/02/21 0305 12/02/21 0950 12/03/21 0419 12/04/21 0333  NA 129* 129* 129*  --  130* 130*  K 3.0* 3.1* 2.8* 3.2* 2.8* 3.0*  CL 91* 89* 91*  --  94* 95*  CO2 22 23 22   --  23 23  GLUCOSE 162* 177* 124*  --  97 132*  BUN 53* 55* 63*  --  68* 72*  CREATININE 6.33* 6.19* 7.84*  --  7.57* 7.21*  CALCIUM 9.5 9.3 8.9  --  8.7* 8.4*  MG 2.2 2.3  --   --  2.0 2.0  PHOS 4.8* 5.8*  --   --   --   --      Liver Function Tests: Recent Labs  Lab 11/30/21 2324 12/02/21 0305 12/03/21 0419  AST 27 15 15   ALT 13 11 9   ALKPHOS 104 99 89  BILITOT 0.8 0.4 0.5  PROT 9.1* 7.9 7.5  ALBUMIN 3.7 3.2* 3.0*    No results for input(s): LIPASE, AMYLASE in the last 168 hours. No results for input(s): AMMONIA in the last 168 hours.  CBC: Recent Labs  Lab 11/30/21 1707 11/30/21 1842 11/30/21 2324 12/01/21 0251 12/03/21 0419 12/04/21 0333  WBC 12.8*   --  15.5* 14.0* 9.4 8.0  NEUTROABS 10.6*  --   --   --   --   --   HGB 11.3* 10.9* 10.9* 10.8* 8.7* 7.9*  HCT 34.2* 32.0* 32.2* 31.0* 26.0* 23.0*  MCV 79.4*  --  78.3* 77.7* 78.3* 79.3*  PLT 87*  --  107* 91* 128* 140*     Cardiac Enzymes: Recent Labs  Lab 11/30/21 2324  CKTOTAL 50     BNP: Invalid input(s): POCBNP  CBG: No results for input(s): GLUCAP in the last 168 hours.  Microbiology: Results for orders placed or performed during the hospital encounter of 11/30/21  Resp Panel by RT-PCR (Flu A&B, Covid) Nasopharyngeal Swab     Status: None   Collection Time: 11/30/21  5:12 PM   Specimen: Nasopharyngeal Swab; Nasopharyngeal(NP) swabs in vial transport medium  Result Value Ref Range Status   SARS Coronavirus 2 by RT PCR NEGATIVE NEGATIVE Final    Comment: (NOTE) SARS-CoV-2 target nucleic acids are NOT DETECTED.  The SARS-CoV-2 RNA is generally detectable in upper respiratory specimens during the acute phase of infection. The lowest concentration of SARS-CoV-2 viral copies this assay can detect is 138 copies/mL. A negative  result does not preclude SARS-Cov-2 infection and should not be used as the sole basis for treatment or other patient management decisions. A negative result may occur with  improper specimen collection/handling, submission of specimen other than nasopharyngeal swab, presence of viral mutation(s) within the areas targeted by this assay, and inadequate number of viral copies(<138 copies/mL). A negative result must be combined with clinical observations, patient history, and epidemiological information. The expected result is Negative.  Fact Sheet for Patients:  EntrepreneurPulse.com.au  Fact Sheet for Healthcare Providers:  IncredibleEmployment.be  This test is no t yet approved or cleared by the [place] FDA and  has been authorized for detection and/or diagnosis of SARS-CoV-2 by FDA under an Emergency  Use Authorization (EUA). This EUA will remain  in effect (meaning this test can be used) for the duration of the COVID-19 declaration under Section 564(b)(1) of the Act, 21 U.S.C.section 360bbb-3(b)(1), unless the authorization is terminated  or revoked sooner.       Influenza A by PCR NEGATIVE NEGATIVE Final   Influenza B by PCR NEGATIVE NEGATIVE Final    Comment: (NOTE) The Xpert Xpress SARS-CoV-2/FLU/RSV plus assay is intended as an aid in the diagnosis of influenza from Nasopharyngeal swab specimens and should not be used as a sole basis for treatment. Nasal washings and aspirates are unacceptable for Xpert Xpress SARS-CoV-2/FLU/RSV testing.  Fact Sheet for Patients: EntrepreneurPulse.com.au  Fact Sheet for Healthcare Providers: IncredibleEmployment.be  This test is not yet approved or cleared by the [place] FDA and has been authorized for detection and/or diagnosis of SARS-CoV-2 by FDA under an Emergency Use Authorization (EUA). This EUA will remain in effect (meaning this test can be used) for the duration of the COVID-19 declaration under Section 564(b)(1) of the Act, 21 U.S.C. section 360bbb-3(b)(1), unless the authorization is terminated or revoked.  Performed at [facility], [place] [address], [place], [place] [address]   MRSA Next Gen by PCR, Nasal     Status: None   Collection Time: 11/30/21 11:11 PM   Specimen: Nasal Mucosa; Nasal Swab  Result Value Ref Range Status   MRSA by PCR Next Gen NOT DETECTED NOT DETECTED Final    Comment: (NOTE) The GeneXpert MRSA Assay (FDA approved for NASAL specimens only), is one component of a comprehensive MRSA colonization surveillance program. It is not intended to diagnose MRSA infection nor to guide or monitor treatment for MRSA infections. Test performance is not FDA approved in patients less than 85 years old. Performed at [facility], [place] [address],  [place],  [address]     Coagulation Studies: No results for input(s): LABPROT, INR in the last 72 hours.   Urinalysis: No results for input(s): COLORURINE, LABSPEC, PHURINE, GLUCOSEU, HGBUR, BILIRUBINUR, KETONESUR, PROTEINUR, UROBILINOGEN, NITRITE, LEUKOCYTESUR in the last 72 hours.  Invalid input(s): APPERANCEUR     Imaging: MR ABDOMEN WO CONTRAST  Result Date: 12/03/2021 CLINICAL DATA:  Hypertension, possible adrenal lesion EXAM: MRI ABDOMEN WITHOUT CONTRAST TECHNIQUE: Multiplanar multisequence MR imaging was performed without the administration of intravenous contrast. COMPARISON:  None. FINDINGS: Study is limited due to motion. Lower chest: Unremarkable. Hepatobiliary: Liver is normal in size and contour with no suspicious mass visualized. Subcentimeter hyperintense T2 signal likely cyst in the left hepatic lobe. Gallbladder appears normal. No biliary ductal dilatation. Pancreas: No mass, inflammatory changes, or other parenchymal abnormality identified. Spleen:  Within normal limits in size and appearance. Adrenals/Urinary Tract: Adrenal mass identified. 1.5 cm mildly complex cyst in the  left kidney with thin internal septations. 0.8 cm simple appearing cyst in the right kidney. No hydronephrosis. Stomach/Bowel: No evidence of bowel obstruction. Vascular/Lymphatic: No pathologically enlarged lymph nodes identified. No abdominal aortic aneurysm demonstrated. Other:  No ascites. Musculoskeletal: No suspicious bone lesions identified. IMPRESSION: 1. No adrenal mass or acute process identified. 2. 1.5 cm mildly complex cyst in the left kidney. Electronically Signed   By: Ofilia Neas M.D.   On: 12/03/2021 19:31     Medications:    niCARDipine Stopped (12/03/21 2203)    amLODipine  10 mg Oral Daily   aspirin EC  81 mg Oral Daily   Chlorhexidine Gluconate Cloth  6 each Topical Daily   cloNIDine  0.1 mg Oral BID   docusate sodium  100 mg Oral BID   heparin injection (subcutaneous)   5,000 Units Subcutaneous Q8H   labetalol  200 mg Oral BID   multivitamin  1 tablet Oral QHS   pantoprazole  40 mg Oral Daily   polyethylene glycol  17 g Oral QHS   potassium chloride  20 mEq Oral Once   diphenhydrAMINE, labetalol, LORazepam  Assessment/ Plan:  Stage IV/V acute on chronic kidney disease.  With accelerated hypertension high risk to progression to dialysis. 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  Blood pressure seems to have improved we will continue to follow.  It appears that nicardipine drip has been discontinued Borderline nephrotic range proteinuria. Very high risk for progression to end-stage renal disease probably will need dialysis and dialysis catheter placed during hospitalization   LOS: [place] @TODAY @7 :50 AM

## 2021-12-05 ENCOUNTER — Ambulatory Visit: Payer: Medicaid Other | Admitting: Physician Assistant

## 2021-12-05 LAB — RETICULOCYTES
Immature Retic Fract: 30.3 % — ABNORMAL HIGH (ref 2.3–15.9)
RBC.: 2.79 MIL/uL — ABNORMAL LOW (ref 4.22–5.81)
Retic Count, Absolute: 112.7 10*3/uL (ref 19.0–186.0)
Retic Ct Pct: 4 % — ABNORMAL HIGH (ref 0.4–3.1)

## 2021-12-05 LAB — RENAL FUNCTION PANEL
Albumin: 2.8 g/dL — ABNORMAL LOW (ref 3.5–5.0)
Anion gap: 11 (ref 5–15)
BUN: 76 mg/dL — ABNORMAL HIGH (ref 6–20)
CO2: 21 mmol/L — ABNORMAL LOW (ref 22–32)
Calcium: 8.6 mg/dL — ABNORMAL LOW (ref 8.9–10.3)
Chloride: 96 mmol/L — ABNORMAL LOW (ref 98–111)
Creatinine, Ser: 7.55 mg/dL — ABNORMAL HIGH (ref 0.61–1.24)
GFR, Estimated: 8 mL/min — ABNORMAL LOW (ref >60)
Glucose, Bld: 106 mg/dL — ABNORMAL HIGH (ref 70–99)
Phosphorus: 5 mg/dL — ABNORMAL HIGH (ref 2.5–4.6)
Potassium: 3.7 mmol/L (ref 3.5–5.1)
Sodium: 128 mmol/L — ABNORMAL LOW (ref 135–145)

## 2021-12-05 LAB — IRON AND TIBC
Iron: 27 ug/dL — ABNORMAL LOW (ref 45–182)
Saturation Ratios: 10 % — ABNORMAL LOW (ref 17.9–39.5)
TIBC: 259 ug/dL (ref 250–450)
UIBC: 232 ug/dL

## 2021-12-05 LAB — CBC
HCT: 22.4 % — ABNORMAL LOW (ref 39.0–52.0)
Hemoglobin: 7.5 g/dL — ABNORMAL LOW (ref 13.0–17.0)
MCH: 27 pg (ref 26.0–34.0)
MCHC: 33.5 g/dL (ref 30.0–36.0)
MCV: 80.6 fL (ref 80.0–100.0)
Platelets: 140 10*3/uL — ABNORMAL LOW (ref 150–400)
RBC: 2.78 MIL/uL — ABNORMAL LOW (ref 4.22–5.81)
RDW: 17.4 % — ABNORMAL HIGH (ref 11.5–15.5)
WBC: 8.4 10*3/uL (ref 4.0–10.5)
nRBC: 0 % (ref 0.0–0.2)

## 2021-12-05 LAB — VITAMIN B12: Vitamin B-12: 611 pg/mL (ref 180–914)

## 2021-12-05 LAB — FERRITIN: Ferritin: 233 ng/mL (ref 24–336)

## 2021-12-05 LAB — FOLATE: Folate: 28.1 ng/mL (ref >5.9)

## 2021-12-05 MED ORDER — CLONIDINE HCL 0.1 MG PO TABS
0.1000 mg | ORAL_TABLET | Freq: Two times a day (BID) | ORAL | Status: DC
  Administered 2021-12-05 – 2021-12-07 (×4): 0.1 mg via ORAL
  Filled 2021-12-05 (×4): qty 1

## 2021-12-05 MED ORDER — POLYETHYLENE GLYCOL 3350 17 G PO PACK
17.0000 g | PACK | Freq: Two times a day (BID) | ORAL | Status: DC
  Administered 2021-12-05 (×2): 17 g via ORAL
  Filled 2021-12-05 (×3): qty 1

## 2021-12-05 NOTE — Progress Notes (Signed)
[facility] ROUNDING NOTE   Subjective:   Interval History: This is a 46 year old gentleman with a history of stage IV chronic kidney disease with malignant hypertension and press syndrome.  Blood pressure 138/84 pulse 70 temperature 98.9    Labs pending from 12/05/2021  Objective:  Vital signs in last 24 hours:  Temp:  [97.5 F (36.4 C)-99.2 F (37.3 C)] 98.9 F (37.2 C) (03/08 0400) Resp:  [12-25] 25 (03/08 0500) BP: (108-158)/(65-107) 136/77 (03/08 0500) SpO2:  [96 %-100 %] 100 % (03/08 0000)  Weight change:  Filed Weights   11/30/21 2252 11/30/21 2312  Weight: 76 kg 69.9 kg    Intake/Output: I/O last 3 completed shifts: In: 1810.4 [P.O.:1650; I.V.:160.4] Out: 2450 [Urine:2450]   Intake/Output this shift:  No intake/output data recorded.  CVS- RRR RS- CTA ABD- BS present soft non-distended EXT- no edema   Basic Metabolic Panel: Recent Labs  Lab 11/30/21 2324 12/01/21 0251 12/02/21 0305 12/02/21 0950 12/03/21 0419 12/04/21 0333  NA 129* 129* 129*  --  130* 130*  K 3.0* 3.1* 2.8* 3.2* 2.8* 3.0*  CL 91* 89* 91*  --  94* 95*  CO2 22 23 22   --  23 23  GLUCOSE 162* 177* 124*  --  97 132*  BUN 53* 55* 63*  --  68* 72*  CREATININE 6.33* 6.19* 7.84*  --  7.57* 7.21*  CALCIUM 9.5 9.3 8.9  --  8.7* 8.4*  MG 2.2 2.3  --   --  2.0 2.0  PHOS 4.8* 5.8*  --   --   --   --      Liver Function Tests: Recent Labs  Lab 11/30/21 2324 12/02/21 0305 12/03/21 0419  AST 27 15 15   ALT 13 11 9   ALKPHOS 104 99 89  BILITOT 0.8 0.4 0.5  PROT 9.1* 7.9 7.5  ALBUMIN 3.7 3.2* 3.0*    No results for input(s): LIPASE, AMYLASE in the last 168 hours. No results for input(s): AMMONIA in the last 168 hours.  CBC: Recent Labs  Lab 11/30/21 1707 11/30/21 1842 11/30/21 2324 12/01/21 0251 12/03/21 0419 12/04/21 0333 12/05/21 0536  WBC 12.8*  --  15.5* 14.0* 9.4 8.0 8.4  NEUTROABS 10.6*  --   --   --   --   --   --   HGB 11.3*   < > 10.9* 10.8* 8.7* 7.9*  7.5*  HCT 34.2*   < > 32.2* 31.0* 26.0* 23.0* 22.4*  MCV 79.4*  --  78.3* 77.7* 78.3* 79.3* 80.6  PLT 87*  --  107* 91* 128* 140* 140*   < > = values in this interval not displayed.     Cardiac Enzymes: Recent Labs  Lab 11/30/21 2324  CKTOTAL 50     BNP: Invalid input(s): POCBNP  CBG: No results for input(s): GLUCAP in the last 168 hours.  Microbiology: Results for orders placed or performed during the hospital encounter of 11/30/21  Resp Panel by RT-PCR (Flu A&B, Covid) Nasopharyngeal Swab     Status: None   Collection Time: 11/30/21  5:12 PM   Specimen: Nasopharyngeal Swab; Nasopharyngeal(NP) swabs in vial transport medium  Result Value Ref Range Status   SARS Coronavirus 2 by RT PCR NEGATIVE NEGATIVE Final    Comment: (NOTE) SARS-CoV-2 target nucleic acids are NOT DETECTED.  The SARS-CoV-2 RNA is generally detectable in upper respiratory specimens during the acute phase of infection. The lowest concentration of SARS-CoV-2 viral copies this assay can detect is 138  copies/mL. A negative result does not preclude SARS-Cov-2 infection and should not be used as the sole basis for treatment or other patient management decisions. A negative result may occur with  improper specimen collection/handling, submission of specimen other than nasopharyngeal swab, presence of viral mutation(s) within the areas targeted by this assay, and inadequate number of viral copies(<138 copies/mL). A negative result must be combined with clinical observations, patient history, and epidemiological information. The expected result is Negative.  Fact Sheet for Patients:  EntrepreneurPulse.com.au  Fact Sheet for Healthcare Providers:  IncredibleEmployment.be  This test is no t yet approved or cleared by the [place] FDA and  has been authorized for detection and/or diagnosis of SARS-CoV-2 by FDA under an Emergency Use Authorization (EUA). This EUA will  remain  in effect (meaning this test can be used) for the duration of the COVID-19 declaration under Section 564(b)(1) of the Act, 21 U.S.C.section 360bbb-3(b)(1), unless the authorization is terminated  or revoked sooner.       Influenza A by PCR NEGATIVE NEGATIVE Final   Influenza B by PCR NEGATIVE NEGATIVE Final    Comment: (NOTE) The Xpert Xpress SARS-CoV-2/FLU/RSV plus assay is intended as an aid in the diagnosis of influenza from Nasopharyngeal swab specimens and should not be used as a sole basis for treatment. Nasal washings and aspirates are unacceptable for Xpert Xpress SARS-CoV-2/FLU/RSV testing.  Fact Sheet for Patients: EntrepreneurPulse.com.au  Fact Sheet for Healthcare Providers: IncredibleEmployment.be  This test is not yet approved or cleared by the [place] FDA and has been authorized for detection and/or diagnosis of SARS-CoV-2 by FDA under an Emergency Use Authorization (EUA). This EUA will remain in effect (meaning this test can be used) for the duration of the COVID-19 declaration under Section 564(b)(1) of the Act, 21 U.S.C. section 360bbb-3(b)(1), unless the authorization is terminated or revoked.  Performed at [facility], [place] [address] [place], [place] [address]   MRSA Next Gen by PCR, Nasal     Status: None   Collection Time: 11/30/21 11:11 PM   Specimen: Nasal Mucosa; Nasal Swab  Result Value Ref Range Status   MRSA by PCR Next Gen NOT DETECTED NOT DETECTED Final    Comment: (NOTE) The GeneXpert MRSA Assay (FDA approved for NASAL specimens only), is one component of a comprehensive MRSA colonization surveillance program. It is not intended to diagnose MRSA infection nor to guide or monitor treatment for MRSA infections. Test performance is not FDA approved in patients less than 60 years old. Performed at [facility], [place] [address], [place],  [address]      Coagulation Studies: No results for input(s): LABPROT, INR in the last 72 hours.   Urinalysis: No results for input(s): COLORURINE, LABSPEC, PHURINE, GLUCOSEU, HGBUR, BILIRUBINUR, KETONESUR, PROTEINUR, UROBILINOGEN, NITRITE, LEUKOCYTESUR in the last 72 hours.  Invalid input(s): APPERANCEUR     Imaging: MR ABDOMEN WO CONTRAST  Result Date: 12/03/2021 CLINICAL DATA:  Hypertension, possible adrenal lesion EXAM: MRI ABDOMEN WITHOUT CONTRAST TECHNIQUE: Multiplanar multisequence MR imaging was performed without the administration of intravenous contrast. COMPARISON:  None. FINDINGS: Study is limited due to motion. Lower chest: Unremarkable. Hepatobiliary: Liver is normal in size and contour with no suspicious mass visualized. Subcentimeter hyperintense T2 signal likely cyst in the left hepatic lobe. Gallbladder appears normal. No biliary ductal dilatation. Pancreas: No mass, inflammatory changes, or other parenchymal abnormality identified. Spleen:  Within normal limits in size and appearance. Adrenals/Urinary Tract: Adrenal mass identified. 1.5 cm mildly complex  cyst in the left kidney with thin internal septations. 0.8 cm simple appearing cyst in the right kidney. No hydronephrosis. Stomach/Bowel: No evidence of bowel obstruction. Vascular/Lymphatic: No pathologically enlarged lymph nodes identified. No abdominal aortic aneurysm demonstrated. Other:  No ascites. Musculoskeletal: No suspicious bone lesions identified. IMPRESSION: 1. No adrenal mass or acute process identified. 2. 1.5 cm mildly complex cyst in the left kidney. Electronically Signed   By: Ofilia Neas M.D.   On: 12/03/2021 19:31     Medications:      amLODipine  10 mg Oral Daily   aspirin EC  81 mg Oral Daily   Chlorhexidine Gluconate Cloth  6 each Topical Daily   cloNIDine  0.2 mg Oral BID   docusate sodium  100 mg Oral BID   heparin injection (subcutaneous)  5,000 Units Subcutaneous Q8H   labetalol  300 mg Oral BID    multivitamin  1 tablet Oral QHS   pantoprazole  40 mg Oral Daily   polyethylene glycol  17 g Oral QHS   diphenhydrAMINE, labetalol, LORazepam  Assessment/ Plan:  Stage IV/V acute on chronic kidney disease.  With accelerated hypertension high risk to progression to dialysis. 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  Blood pressure seems to have improved we will continue to follow.  It appears that nicardipine drip has been discontinued. Borderline nephrotic range proteinuria. Very high risk for progression to end-stage renal disease probably will need dialysis and dialysis catheter placed during hospitalization   LOS: [place] @TODAY @7 :34 AM

## 2021-12-05 NOTE — Progress Notes (Signed)
PROGRESS NOTE        PATIENT DETAILS Name: Roy Randolph Age: 46 y.o. Sex: male Date of Birth: 02-Apr-1976 Admit Date: 11/30/2021 Admitting Physician Samara Deist, MD [id number], Roy Batman, MD  Brief Summary: Patient is a 46 y.o.  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  Admitted to the [facility] started on Cardene infusion.  Upon stability-transfer to the [facility].  Significant Hospital events: 3/3>> presenting with headaches/visual problems-uncontrolled HTN-AKI-admit to ICU for Cardene infusion.  MRI suggestive of PRES. 3/7>> transfer to [facility]  Significant imaging studies: 3/3>> MRI brain: Findings suggestive of PRES. 3/6>> MRI abdomen: No adrenal mass, 1.5 cm complex cyst involving left kidney  Significant microbiology data: 3/3>> COVID/influenza PCR: Negative  Procedures: None  Consults:  PCCM, nephrology, neurology, cardiology  Subjective: Lying comfortably in bed-no chest pain or shortness of breath.  Objective: Vitals: Blood pressure 124/87, pulse 74, temperature 97.9 F (36.6 C), temperature source Oral, resp. rate 14, height 5\' 9"  (1.753 m), weight 69.9 kg, SpO2 100 %.   Exam: Gen Exam:Alert awake-not in any distress HEENT:atraumatic, normocephalic Chest: B/L clear to auscultation anteriorly CVS:S1S2 regular Abdomen:soft non tender, non distended Extremities:no edema Neurology: Non focal Skin: no rash   Pertinent Labs/Radiology: CBC Latest Ref Rng & Units 12/05/2021 12/04/2021 12/03/2021  WBC 4.0 - 10.5 K/uL 8.4 8.0 9.4  Hemoglobin 13.0 - 17.0 g/dL 7.5(L) 7.9(L) 8.7(L)  Hematocrit 39.0 - 52.0 % 22.4(L) 23.0(L) 26.0(L)  Platelets 150 - 400 K/uL 140(L) 140(L) 128(L)    Lab Results   Component Value Date   NA 128 (L) 12/05/2021   K 3.7 12/05/2021   CL 96 (L) 12/05/2021   CO2 21 (L) 12/05/2021      Assessment/Plan: AKI on CKD stage IV: AKI likely hemodynamically mediated-in the setting of uncontrolled hypertension.  Given persistent elevation in his creatinine level-concerned that he may be progressing to ESRD.  However he has been stable for the past several days-good urine output-electrolytes are stable.  Continue to watch closely-hopefully with better control of his blood pressure-his creatinine will start to improve.  No acute indication for RRT at this point.  Nephrology following.  Hypertensive emergency: Resolved-required Cardene infusion and ICU monitoring.  Now on oral antihypertensives-see below.  PRES: Due to above-headache resolved-had significant visual issues when he first presented-that has since resolved and he is now back to his baseline (blurry vision at baseline since discharge from the hospital in January.  HTN: Better controlled-continue current dosing of labetalol/clonidine and amlodipine.  Follow and adjust.    Microcytic anemia: Suspect related to CKD-defer Aranesp/IV iron to nephrology service.  Check anemia panel with a.m. labs.  Hyponatremia: Mild-probably due to impaired free water excretion in the setting of AKI.  Watch closely.  Hypokalemia: Replete and recheck.  Prior aldosterone/PRA ratio not consistent with primary hyperaldosteronism.  HFpEF: Volume status stable  ?  OSA: PCCM planning outpatient sleep study.  Nutrition Status: Nutrition Problem: Inadequate oral intake Etiology: acute illness Signs/Symptoms: NPO status Interventions: MVI, Refer to RD note for recommendations, Education  BMI: Estimated body mass index is 22.76 kg/m as calculated from the following:   Height as of this encounter: 5\' 9"  (  1.753 m).   Weight as of this encounter: 69.9 kg.   Code status:   Code Status: Full Code   DVT Prophylaxis: heparin  injection 5,000 Units Start: 12/01/21 1000 Place and maintain sequential compression device Start: 11/30/21 2148   Family Communication: None at bedside   Disposition Plan: Status is: Inpatient Remains inpatient appropriate because: AKI-concern for progression to ESRD-not yet stable for discharge   Planned Discharge Destination:Home   Diet: Diet Order             Diet Heart Room service appropriate? Yes with Assist; Fluid consistency: Thin  Diet effective now                     Antimicrobial agents: Anti-infectives (From admission, onward)    None        MEDICATIONS: Scheduled Meds:  amLODipine  10 mg Oral Daily   aspirin EC  81 mg Oral Daily   Chlorhexidine Gluconate Cloth  6 each Topical Daily   cloNIDine  0.2 mg Oral BID   docusate sodium  100 mg Oral BID   heparin injection (subcutaneous)  5,000 Units Subcutaneous Q8H   labetalol  300 mg Oral BID   multivitamin  1 tablet Oral QHS   pantoprazole  40 mg Oral Daily   polyethylene glycol  17 g Oral BID   Continuous Infusions: PRN Meds:.diphenhydrAMINE, labetalol, LORazepam   I have personally reviewed following labs and imaging studies  LABORATORY DATA: CBC: Recent Labs  Lab 11/30/21 1707 11/30/21 1842 11/30/21 2324 12/01/21 0251 12/03/21 0419 12/04/21 0333 12/05/21 0536  WBC 12.8*  --  15.5* 14.0* 9.4 8.0 8.4  NEUTROABS 10.6*  --   --   --   --   --   --   HGB 11.3*   < > 10.9* 10.8* 8.7* 7.9* 7.5*  HCT 34.2*   < > 32.2* 31.0* 26.0* 23.0* 22.4*  MCV 79.4*  --  78.3* 77.7* 78.3* 79.3* 80.6  PLT 87*  --  107* 91* 128* 140* 140*   < > = values in this interval not displayed.     Basic Metabolic Panel: Recent Labs  Lab 11/30/21 2324 12/01/21 0251 12/02/21 0305 12/02/21 0950 12/03/21 0419 12/04/21 0333 12/05/21 0536  NA 129* 129* 129*  --  130* 130* 128*  K 3.0* 3.1* 2.8* 3.2* 2.8* 3.0* 3.7  CL 91* 89* 91*  --  94* 95* 96*  CO2 22 23 22   --  23 23 21*  GLUCOSE 162* 177* 124*  --   97 132* 106*  BUN 53* 55* 63*  --  68* 72* 76*  CREATININE 6.33* 6.19* 7.84*  --  7.57* 7.21* 7.55*  CALCIUM 9.5 9.3 8.9  --  8.7* 8.4* 8.6*  MG 2.2 2.3  --   --  2.0 2.0  --   PHOS 4.8* 5.8*  --   --   --   --  5.0*     GFR: Estimated Creatinine Clearance: 12.3 mL/min (A) (by C-G formula based on SCr of 7.55 mg/dL (H)).  Liver Function Tests: Recent Labs  Lab 11/30/21 2324 12/02/21 0305 12/03/21 0419 12/05/21 0536  AST 27 15 15   --   ALT 13 11 9   --   ALKPHOS 104 99 89  --   BILITOT 0.8 0.4 0.5  --   PROT 9.1* 7.9 7.5  --   ALBUMIN 3.7 3.2* 3.0* 2.8*    No results for input(s): LIPASE, AMYLASE in the  last 168 hours. No results for input(s): AMMONIA in the last 168 hours.  Coagulation Profile: Recent Labs  Lab 11/30/21 1748  INR 1.2     Cardiac Enzymes: Recent Labs  Lab 11/30/21 2324  CKTOTAL 50     BNP (last 3 results) No results for input(s): PROBNP in the last 8760 hours.  Lipid Profile: No results for input(s): CHOL, HDL, LDLCALC, TRIG, CHOLHDL, LDLDIRECT in the last 72 hours.   Thyroid Function Tests: No results for input(s): TSH, T4TOTAL, FREET4, T3FREE, THYROIDAB in the last 72 hours.  Anemia Panel: Recent Labs    12/05/21 0536 12/05/21 0631  VITAMINB12 611  --   FOLATE  --  28.1  FERRITIN 233  --   TIBC 259  --   IRON 27*  --   RETICCTPCT 4.0*  --     Urine analysis:    Component Value Date/Time   COLORURINE YELLOW 11/30/2021 2108   APPEARANCEUR HAZY (A) 11/30/2021 2108   LABSPEC 1.017 11/30/2021 2108   PHURINE 5.0 11/30/2021 2108   GLUCOSEU NEGATIVE 11/30/2021 2108   HGBUR MODERATE (A) 11/30/2021 2108   BILIRUBINUR NEGATIVE 11/30/2021 2108   KETONESUR NEGATIVE 11/30/2021 2108   PROTEINUR >=300 (A) 11/30/2021 2108   NITRITE NEGATIVE 11/30/2021 2108   LEUKOCYTESUR NEGATIVE 11/30/2021 2108    Sepsis Labs: Lactic Acid, Venous    Component Value Date/Time   LATICACIDVEN 2.0 (HH) 11/30/2021 2324    MICROBIOLOGY: Recent  Results (from the past 240 hour(s))  Resp Panel by RT-PCR (Flu A&B, Covid) Nasopharyngeal Swab     Status: None   Collection Time: 11/30/21  5:12 PM   Specimen: Nasopharyngeal Swab; Nasopharyngeal(NP) swabs in vial transport medium  Result Value Ref Range Status   SARS Coronavirus 2 by RT PCR NEGATIVE NEGATIVE Final    Comment: (NOTE) SARS-CoV-2 target nucleic acids are NOT DETECTED.  The SARS-CoV-2 RNA is generally detectable in upper respiratory specimens during the acute phase of infection. The lowest concentration of SARS-CoV-2 viral copies this assay can detect is 138 copies/mL. A negative result does not preclude SARS-Cov-2 infection and should not be used as the sole basis for treatment or other patient management decisions. A negative result may occur with  improper specimen collection/handling, submission of specimen other than nasopharyngeal swab, presence of viral mutation(s) within the areas targeted by this assay, and inadequate number of viral copies(<138 copies/mL). A negative result must be combined with clinical observations, patient history, and epidemiological information. The expected result is Negative.  Fact Sheet for Patients:  EntrepreneurPulse.com.au  Fact Sheet for Healthcare Providers:  IncredibleEmployment.be  This test is no t yet approved or cleared by the [place] FDA and  has been authorized for detection and/or diagnosis of SARS-CoV-2 by FDA under an Emergency Use Authorization (EUA). This EUA will remain  in effect (meaning this test can be used) for the duration of the COVID-19 declaration under Section 564(b)(1) of the Act, 21 U.S.C.section 360bbb-3(b)(1), unless the authorization is terminated  or revoked sooner.       Influenza A by PCR NEGATIVE NEGATIVE Final   Influenza B by PCR NEGATIVE NEGATIVE Final    Comment: (NOTE) The Xpert Xpress SARS-CoV-2/FLU/RSV plus assay is intended as an aid in the  diagnosis of influenza from Nasopharyngeal swab specimens and should not be used as a sole basis for treatment. Nasal washings and aspirates are unacceptable for Xpert Xpress SARS-CoV-2/FLU/RSV testing.  Fact Sheet for Patients: EntrepreneurPulse.com.au  Fact Sheet for Healthcare Providers: IncredibleEmployment.be  This test is not yet approved or cleared by the [place] and has been authorized for detection and/or diagnosis of SARS-CoV-2 by FDA under an Emergency Use Authorization (EUA). This EUA will remain in effect (meaning this test can be used) for the duration of the COVID-19 declaration under Section 564(b)(1) of the Act, 21 U.S.C. section 360bbb-3(b)(1), unless the authorization is terminated or revoked.  Performed at [facility], [place] [address] [place], [place] [address]   MRSA Next Gen by PCR, Nasal     Status: None   Collection Time: 11/30/21 11:11 PM   Specimen: Nasal Mucosa; Nasal Swab  Result Value Ref Range Status   MRSA by PCR Next Gen NOT DETECTED NOT DETECTED Final    Comment: (NOTE) The GeneXpert MRSA Assay (FDA approved for NASAL specimens only), is one component of a comprehensive MRSA colonization surveillance program. It is not intended to diagnose MRSA infection nor to guide or monitor treatment for MRSA infections. Test performance is not FDA approved in patients less than 80 years old. Performed at [facility], [place] [address] [place], [place] [address]     RADIOLOGY STUDIES/RESULTS: MR ABDOMEN WO CONTRAST  Result Date: 12/03/2021 CLINICAL DATA:  Hypertension, possible adrenal lesion EXAM: MRI ABDOMEN WITHOUT CONTRAST TECHNIQUE: Multiplanar multisequence MR imaging was performed without the administration of intravenous contrast. COMPARISON:  None. FINDINGS: Study is limited due to motion. Lower chest: Unremarkable. Hepatobiliary: Liver is normal in size and contour with no  suspicious mass visualized. Subcentimeter hyperintense T2 signal likely cyst in the left hepatic lobe. Gallbladder appears normal. No biliary ductal dilatation. Pancreas: No mass, inflammatory changes, or other parenchymal abnormality identified. Spleen:  Within normal limits in size and appearance. Adrenals/Urinary Tract: Adrenal mass identified. 1.5 cm mildly complex cyst in the left kidney with thin internal septations. 0.8 cm simple appearing cyst in the right kidney. No hydronephrosis. Stomach/Bowel: No evidence of bowel obstruction. Vascular/Lymphatic: No pathologically enlarged lymph nodes identified. No abdominal aortic aneurysm demonstrated. Other:  No ascites. Musculoskeletal: No suspicious bone lesions identified. IMPRESSION: 1. No adrenal mass or acute process identified. 2. 1.5 cm mildly complex cyst in the left kidney. Electronically Signed   By: Ofilia Neas M.D.   On: 12/03/2021 19:31     LOS: 5 days   Oren Binet, MD  [facility]    To contact the attending provider between 7A-7P or the covering provider during after hours 7P-7A, please log into the web site [email or web address] and access using universal [place] password for that web site. If you do not have the password, please call the hospital operator.  12/05/2021, 2:14 PM

## 2021-12-06 DIAGNOSIS — D649 Anemia, unspecified: Secondary | ICD-10-CM

## 2021-12-06 DIAGNOSIS — E876 Hypokalemia: Secondary | ICD-10-CM

## 2021-12-06 LAB — RENAL FUNCTION PANEL
Albumin: 2.7 g/dL — ABNORMAL LOW (ref 3.5–5.0)
Anion gap: 13 (ref 5–15)
BUN: 79 mg/dL — ABNORMAL HIGH (ref 6–20)
CO2: 20 mmol/L — ABNORMAL LOW (ref 22–32)
Calcium: 8.8 mg/dL — ABNORMAL LOW (ref 8.9–10.3)
Chloride: 99 mmol/L (ref 98–111)
Creatinine, Ser: 7.65 mg/dL — ABNORMAL HIGH (ref 0.61–1.24)
GFR, Estimated: 8 mL/min — ABNORMAL LOW (ref >60)
Glucose, Bld: 118 mg/dL — ABNORMAL HIGH (ref 70–99)
Phosphorus: 5.4 mg/dL — ABNORMAL HIGH (ref 2.5–4.6)
Potassium: 3.8 mmol/L (ref 3.5–5.1)
Sodium: 132 mmol/L — ABNORMAL LOW (ref 135–145)

## 2021-12-06 LAB — CBC
HCT: 22 % — ABNORMAL LOW (ref 39.0–52.0)
Hemoglobin: 7.1 g/dL — ABNORMAL LOW (ref 13.0–17.0)
MCH: 26.3 pg (ref 26.0–34.0)
MCHC: 32.3 g/dL (ref 30.0–36.0)
MCV: 81.5 fL (ref 80.0–100.0)
Platelets: 151 10*3/uL (ref 150–400)
RBC: 2.7 MIL/uL — ABNORMAL LOW (ref 4.22–5.81)
RDW: 17.7 % — ABNORMAL HIGH (ref 11.5–15.5)
WBC: 8.2 10*3/uL (ref 4.0–10.5)
nRBC: 0 % (ref 0.0–0.2)

## 2021-12-06 MED ORDER — SODIUM CHLORIDE 0.9 % IV SOLN
200.0000 mg | INTRAVENOUS | Status: DC
  Administered 2021-12-06 – 2021-12-07 (×2): 200 mg via INTRAVENOUS
  Filled 2021-12-06 (×2): qty 10

## 2021-12-06 MED ORDER — NEPRO/CARBSTEADY PO LIQD
237.0000 mL | Freq: Two times a day (BID) | ORAL | Status: DC
Start: 2021-12-06 — End: 2021-12-07
  Administered 2021-12-07: 237 mL via ORAL

## 2021-12-06 NOTE — Progress Notes (Signed)
[facility] ?ROUNDING NOTE  ? ?Subjective:  ? ?Interval History: This is a 46 year old gentleman with a history of stage IV chronic kidney disease with malignant hypertension and press syndrome. ? ?Blood pressure 150/81 pulse 90 temperature 98 O2 sats 97%  ? ?sodium 132 potassium 3.8 chloride 99 CO2 20 BUN 79 creatinine 7.65 glucose 118 calcium 8.8 phosphorus 5.4 iron saturation is 10%. ? ?  ? ?Objective:  ?Vital signs in last 24 hours:  ?Temp:  [97.8 ?F (36.6 ?C)-98.7 ?F (37.1 ?C)] 98 ?F (36.7 ?C) (03/09 0800) ?Pulse Rate:  [69-82] 73 (03/09 0600) ?Resp:  [13-25] 20 (03/09 0600) ?BP: (109-143)/(63-108) 138/83 (03/09 0600) ?SpO2:  [93 %-100 %] 97 % (03/09 0600) ? ?Weight change:  ?Filed Weights  ? 11/30/21 2252 11/30/21 2312  ?Weight: 76 kg 69.9 kg  ? ? ?Intake/Output: ?I/O last 3 completed shifts: ?In: 1220 [P.O.:1220] ?Out: 2575 [Urine:2575] ?  ?Intake/Output this shift: ? No intake/output data recorded. ? ?CVS- RRR no murmurs rubs or gallops ?RS- CTA no wheezes rales ?ABD- BS present soft non-distended ?EXT- no edema ? ? ?Basic Metabolic Panel: ?Recent Labs  ?Lab 11/30/21 ?2324 12/01/21 ?0251 12/02/21 ?6967 12/02/21 ?8938 12/03/21 ?1017 12/04/21 ?5102 12/05/21 ?5852 12/06/21 ?0255  ?NA 129* 129* 129*  --  130* 130* 128* 132*  ?K 3.0* 3.1* 2.8* 3.2* 2.8* 3.0* 3.7 3.8  ?CL 91* 89* 91*  --  94* 95* 96* 99  ?CO2 22 23 22   --  23 23 21* 20*  ?GLUCOSE 162* 177* 124*  --  97 132* 106* 118*  ?BUN 53* 55* 63*  --  68* 72* 76* 79*  ?CREATININE 6.33* 6.19* 7.84*  --  7.57* 7.21* 7.55* 7.65*  ?CALCIUM 9.5 9.3 8.9  --  8.7* 8.4* 8.6* 8.8*  ?MG 2.2 2.3  --   --  2.0 2.0  --   --   ?PHOS 4.8* 5.8*  --   --   --   --  5.0* 5.4*  ? ? ? ?Liver Function Tests: ?Recent Labs  ?Lab 11/30/21 ?2324 12/02/21 ?0305 12/03/21 ?0419 12/05/21 ?7782 12/06/21 ?0255  ?AST 27 15 15   --   --   ?ALT 13 11 9   --   --   ?ALKPHOS 104 99 89  --   --   ?BILITOT 0.8 0.4 0.5  --   --   ?PROT 9.1* 7.9 7.5  --   --   ?ALBUMIN 3.7 3.2* 3.0*  2.8* 2.7*  ? ? ?No results for input(s): LIPASE, AMYLASE in the last 168 hours. ?No results for input(s): AMMONIA in the last 168 hours. ? ?CBC: ?Recent Labs  ?Lab 11/30/21 ?1707 11/30/21 ?4235 12/01/21 ?3614 12/03/21 ?4315 12/04/21 ?4008 12/05/21 ?6761 12/06/21 ?0255  ?WBC 12.8*   < > 14.0* 9.4 8.0 8.4 8.2  ?NEUTROABS 10.6*  --   --   --   --   --   --   ?HGB 11.3*   < > 10.8* 8.7* 7.9* 7.5* 7.1*  ?HCT 34.2*   < > 31.0* 26.0* 23.0* 22.4* 22.0*  ?MCV 79.4*   < > 77.7* 78.3* 79.3* 80.6 81.5  ?PLT 87*   < > 91* 128* 140* 140* 151  ? < > = values in this interval not displayed.  ? ? ? ?Cardiac Enzymes: ?Recent Labs  ?Lab 11/30/21 ?2324  ?CKTOTAL 50  ? ? ? ?BNP: ?Invalid input(s): POCBNP ? ?CBG: ?No results for input(s): GLUCAP in the last 168 hours. ? ?Microbiology: ?Results for  orders placed or performed during the hospital encounter of 11/30/21  ?Resp Panel by RT-PCR (Flu A&B, Covid) Nasopharyngeal Swab     Status: None  ? Collection Time: 11/30/21  5:12 PM  ? Specimen: Nasopharyngeal Swab; Nasopharyngeal(NP) swabs in vial transport medium  ?Result Value Ref Range Status  ? SARS Coronavirus 2 by RT PCR NEGATIVE NEGATIVE Final  ?  Comment: (NOTE) ?SARS-CoV-2 target nucleic acids are NOT DETECTED. ? ?The SARS-CoV-2 RNA is generally detectable in upper respiratory ?specimens during the acute phase of infection. The lowest ?concentration of SARS-CoV-2 viral copies this assay can detect is ?138 copies/mL. A negative result does not preclude SARS-Cov-2 ?infection and should not be used as the sole basis for treatment or ?other patient management decisions. A negative result may occur with  ?improper specimen collection/handling, submission of specimen other ?than nasopharyngeal swab, presence of viral mutation(s) within the ?areas targeted by this assay, and inadequate number of viral ?copies(<138 copies/mL). A negative result must be combined with ?clinical observations, patient history, and epidemiological ?information.  The expected result is Negative. ? ?Fact Sheet for Patients:  ?EntrepreneurPulse.com.au ? ?Fact Sheet for Healthcare Providers:  ?IncredibleEmployment.be ? ?This test is no t yet approved or cleared by the [place] FDA and  ?has been authorized for detection and/or diagnosis of SARS-CoV-2 by ?FDA under an Emergency Use Authorization (EUA). This EUA will remain  ?in effect (meaning this test can be used) for the duration of the ?COVID-19 declaration under Section 564(b)(1) of the Act, 21 ?U.S.C.section 360bbb-3(b)(1), unless the authorization is terminated  ?or revoked sooner.  ? ? ?  ? Influenza A by PCR NEGATIVE NEGATIVE Final  ? Influenza B by PCR NEGATIVE NEGATIVE Final  ?  Comment: (NOTE) ?The Xpert Xpress SARS-CoV-2/FLU/RSV plus assay is intended as an aid ?in the diagnosis of influenza from Nasopharyngeal swab specimens and ?should not be used as a sole basis for treatment. Nasal washings and ?aspirates are unacceptable for Xpert Xpress SARS-CoV-2/FLU/RSV ?testing. ? ?Fact Sheet for Patients: ?EntrepreneurPulse.com.au ? ?Fact Sheet for Healthcare Providers: ?IncredibleEmployment.be ? ?This test is not yet approved or cleared by the [place] FDA and ?has been authorized for detection and/or diagnosis of SARS-CoV-2 by ?FDA under an Emergency Use Authorization (EUA). This EUA will remain ?in effect (meaning this test can be used) for the duration of the ?COVID-19 declaration under Section 564(b)(1) of the Act, 21 U.S.C. ?section 360bbb-3(b)(1), unless the authorization is terminated or ?revoked. ? ?Performed at [facility], [place] [address] ?[place], [place] [address] ?  ?MRSA Next Gen by PCR, Nasal     Status: None  ? Collection Time: 11/30/21 11:11 PM  ? Specimen: Nasal Mucosa; Nasal Swab  ?Result Value Ref Range Status  ? MRSA by PCR Next Gen NOT DETECTED NOT DETECTED Final  ?  Comment: (NOTE) ?The GeneXpert MRSA  Assay (FDA approved for NASAL specimens only), ?is one component of a comprehensive MRSA colonization surveillance ?program. It is not intended to diagnose MRSA infection nor to guide ?or monitor treatment for MRSA infections. ?Test performance is not FDA approved in patients less than 2 years ?old. ?Performed at [facility], [place] [address], [place], [place] ?[address] ?  ? ? ?Coagulation Studies: ?No results for input(s): LABPROT, INR in the last 72 hours. ? ? ?Urinalysis: ?No results for input(s): COLORURINE, LABSPEC, [place], GLUCOSEU, HGBUR, BILIRUBINUR, KETONESUR, PROTEINUR, UROBILINOGEN, NITRITE, LEUKOCYTESUR in the last 72 hours. ? ?Invalid input(s): APPERANCEUR ?  ? ? ?Imaging: ?No  results found. ? ? ?Medications:  ? ? ? ? amLODipine  10 mg Oral Daily  ? aspirin EC  81 mg Oral Daily  ? Chlorhexidine Gluconate Cloth  6 each Topical Daily  ? cloNIDine  0.1 mg Oral BID  ? docusate sodium  100 mg Oral BID  ? heparin injection (subcutaneous)  5,000 Units Subcutaneous Q8H  ? labetalol  300 mg Oral BID  ? multivitamin  1 tablet Oral QHS  ? pantoprazole  40 mg Oral Daily  ? polyethylene glycol  17 g Oral BID  ? ?diphenhydrAMINE, labetalol, LORazepam ? ?Assessment/ Plan:  ?Stage IV/V acute on chronic kidney disease.  With accelerated hypertension high risk to progression to dialysis. 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  Blood pressure control improved ?Borderline nephrotic range proteinuria. ?Very high risk for progression to end-stage renal disease probably will need dialysis and dialysis catheter placed during hospitalization ? ? LOS: 6 ?Sherril Croon ?@TODAY @8 :24 AM ?  ?

## 2021-12-06 NOTE — Progress Notes (Signed)
Nutrition Follow-up ? ?DOCUMENTATION CODES:  ?Not applicable ? ?INTERVENTION:  ?Adjust diet to 2g Sodium, encourage PO intake ?Nepro Shake po BID, each supplement provides 425 kcal and 19 grams protein ?Continue MVI ?Snacks TID ? ?NUTRITION DIAGNOSIS:  ?Inadequate oral intake related to acute illness as evidenced by NPO status. ?- remains applicable, diet advanced ? ?GOAL:  ?Patient will meet greater than or equal to 90% of their needs ?- progressing, diet advanced, supplements added ? ?MONITOR:  ?PO intake, Labs, Weight trends ? ?REASON FOR ASSESSMENT:  ?Malnutrition Screening Tool ?  ? ?ASSESSMENT:  ?46 yo male admitted with HTN emergency with PRES syndrome, AKI on CKD 4, PMH includes poorly controlled HTN, CHF, GERD, tobacco use, former EtOH use ? ?Pt resting in bed at the time of assessment. Reports that appetite is present, but he is still hungry after meals, feels his options are limited and portions are smaller than he wound normally be eating. Discussed changing diet to a tighter sodium restriction but taking off fat restriction. Pt reports that he is also being limited in his fruits and vegetables due to reporting he is allergic to fresh fruits and vegetables. Pt adamant that he can eat cooked versions, just cannot consumed raw. Will remove allergy as pt is alert and oriented and able to avoid ordering these foods. ? ?Discussed weight, pt reports significant weight loss over the last 3 months from 190 lbs as his usual weight to his current of ~150. This amount is not seen in hx, but wt hx is limited. Muscle and fat deficits are present, but not to the extent expected with reported weight loss.  ? ?Did spend some time reviewing recommendations for slowing down progression of CKD. Discussed the importance of adhering to a reduced sodium diet rich in fruits and vegetables. Also discussed managing his blood pressure to avoid further vasculature damage. Reviewed label reading and cooking methods.    ? ? ?Intake/Output Summary (Last 24 hours) at 12/06/2021 1004 ?Last data filed at 12/06/2021 0735 ?Gross per 24 hour  ?Intake 720 ml  ?Output 2225 ml  ?Net -1505 ml  ?Net IO Since Admission: -5,457.42 mL [12/06/21 1004] ? ?Average Meal Intake: ?3/5-3/8: 86% intake x 5 recorded meals ? ?Nutritionally Relevant Medications: ?Scheduled Meds: ? docusate sodium  100 mg Oral BID  ? multivitamin  1 tablet Oral QHS  ? pantoprazole  40 mg Oral Daily  ? polyethylene glycol  17 g Oral BID  ? ?Continuous Infusions: ? iron sucrose    ? ?PRN Meds: diphenhydrAMINE ? ?Labs Reviewed: ?Sodium 132 ?BUN 79, creatinine 7.65 ?Calcium corrects to 9.84 for low albumin ?Phosphorus 5.4 ? ?NUTRITION - FOCUSED PHYSICAL EXAM: ?Flowsheet Row Most Recent Value  ?Orbital Region No depletion  ?Upper Arm Region Moderate depletion  ?Thoracic and Lumbar Region No depletion  ?Buccal Region No depletion  ?Temple Region No depletion  ?Clavicle Bone Region No depletion  ?Clavicle and Acromion Bone Region No depletion  ?Scapular Bone Region Mild depletion  ?Dorsal Hand No depletion  ?Patellar Region Moderate depletion  ?Anterior Thigh Region Moderate depletion  ?Posterior Calf Region Moderate depletion  ?Edema (RD Assessment) None  ?Hair Reviewed  ?Eyes Reviewed  ?Mouth Reviewed  ?Skin Reviewed  ?Nails Reviewed  ? ?Diet Order:   ?Diet Order   ? ?       ?  Diet Heart Room service appropriate? Yes with Assist; Fluid consistency: Thin  Diet effective now       ?  ? ?  ?  ? ?  ? ? ?  EDUCATION NEEDS:  ?Education needs have been addressed ? ?Skin:  Skin Assessment: Reviewed RN Assessment ? ?Last BM:  3/8 ? ?Height:  ?Ht Readings from Last 1 Encounters:  ?11/30/21 5\' 9"  (1.753 m)  ? ? ?Weight:  ?Wt Readings from Last 1 Encounters:  ?11/30/21 69.9 kg  ? ? ?Ideal Body Weight:  72.7 kg ? ?BMI:  Body mass index is 22.76 kg/m?. ? ?Estimated Nutritional Needs:  ?Kcal:  2100-2300 kcals ?Protein:  85-100 g ?Fluid:  >/= 2 L ? ? ?Ranell Patrick, RD, LDN ?Clinical Dietitian ?RD  pager # available in [facility]  ?After hours/weekend pager # available in [place] ?

## 2021-12-06 NOTE — Progress Notes (Signed)
PROGRESS NOTE        PATIENT DETAILS Name: Roy Randolph Age: 46 y.o. Sex: male Date of Birth: [birth year] Admit Date: 11/30/2021 Admitting Physician Samara Deist, MD [id number], [name], MD  Brief Summary: Patient is a 46 y.o.  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  Admitted to the [facility] started on Cardene infusion.  Upon stability-transfer to the [facility].  Significant Hospital events: 3/3>> presenting with headaches/visual problems-uncontrolled HTN-AKI-admit to ICU for Cardene infusion.  MRI suggestive of PRES. 3/7>> transfer to [facility]  Significant imaging studies: 3/3>> MRI brain: Findings suggestive of PRES. 3/6>> MRI abdomen: No adrenal mass, 1.5 cm complex cyst involving left kidney  Significant microbiology data: 3/3>> COVID/influenza PCR: Negative  Procedures: None  Consults:  PCCM, nephrology, neurology, cardiology  Subjective: Lying comfortably in bed.  Denies any chest pain or shortness of breath.  Objective: Vitals: Blood pressure (!) 139/93, pulse 80, temperature 98 F (36.7 C), temperature source Oral, resp. rate 20, height 5\' 9"  (1.753 m), weight 69.9 kg, SpO2 97 %.   Exam: Gen Exam:Alert awake-not in any distress HEENT:atraumatic, normocephalic Chest: B/L clear to auscultation anteriorly CVS:S1S2 regular Abdomen:soft non tender, non distended Extremities:no edema Neurology: Non focal Skin: no rash   Pertinent Labs/Radiology: CBC Latest Ref Rng & Units 12/06/2021 12/05/2021 12/04/2021  WBC 4.0 - 10.5 K/uL 8.2 8.4 8.0  Hemoglobin 13.0 - 17.0 g/dL 7.1(L) 7.5(L) 7.9(L)  Hematocrit 39.0 - 52.0 % 22.0(L) 22.4(L) 23.0(L)  Platelets 150 - 400 K/uL 151 140(L) 140(L)    Lab  Results  Component Value Date   NA 132 (L) 12/06/2021   K 3.8 12/06/2021   CL 99 12/06/2021   CO2 20 (L) 12/06/2021      Assessment/Plan: AKI on CKD stage IV: AKI likely hemodynamically mediated-in the setting of uncontrolled hypertension.  Given persistent elevation in his creatinine level-concerned that he may be progressing to ESRD.  Renal function has been relatively stable over the past several days-has good urine output-other electrolytes are stable-no urgent indication to start RRT at this point.  If this trend continues-suspect we should be able to let him go home soon with close outpatient nephrology follow-up.  Will discuss with nephrology if this is feasible.  Hypertensive emergency: Resolved-required Cardene infusion and ICU monitoring.  Now on oral antihypertensives-see below.  PRES: Due to above-headache resolved-had significant visual issues when he first presented-that has since resolved and he is now back to his baseline (blurry vision at baseline since discharge from the hospital in [place] is scheduling a appointment with his ophthalmologist)   HTN: Better controlled-continue current dosing of labetalol/clonidine and amlodipine.  Follow and adjust.    Microcytic anemia: Suspect related to CKD-defer Aranesp/IV iron to nephrology service.  Check anemia panel with a.m. labs.  Hyponatremia: Mild-probably due to impaired free water excretion in the setting of AKI.  Watch closely.  Hypokalemia: Repleted. Prior aldosterone/PRA ratio not consistent with primary hyperaldosteronism.  HFpEF: Volume status stable  ?  OSA: PCCM planning outpatient sleep study.  Nutrition Status: Nutrition Problem: Inadequate oral intake Etiology: acute illness Signs/Symptoms: NPO status Interventions: MVI, Refer to RD note for recommendations, Education  BMI: Estimated body  mass index is 22.76 kg/m as calculated from the following:   Height as of this encounter: 5\' 9"  (1.753 m).    Weight as of this encounter: 69.9 kg.   Code status:   Code Status: Full Code   DVT Prophylaxis: heparin injection 5,000 Units Start: 12/01/21 1000 Place and maintain sequential compression device Start: 11/30/21 2148   Family Communication: None at bedside   Disposition Plan: Status is: Inpatient Remains inpatient appropriate because: AKI-concern for progression to ESRD-not yet stable for discharge   Planned Discharge Destination:Home   Diet: Diet Order             Diet 2 gram sodium Room service appropriate? Yes; Fluid consistency: Thin  Diet effective now                     Antimicrobial agents: Anti-infectives (From admission, onward)    None        MEDICATIONS: Scheduled Meds:  amLODipine  10 mg Oral Daily   aspirin EC  81 mg Oral Daily   Chlorhexidine Gluconate Cloth  6 each Topical Daily   cloNIDine  0.1 mg Oral BID   docusate sodium  100 mg Oral BID   feeding supplement (NEPRO CARB STEADY)  237 mL Oral BID BM   heparin injection (subcutaneous)  5,000 Units Subcutaneous Q8H   labetalol  300 mg Oral BID   multivitamin  1 tablet Oral QHS   pantoprazole  40 mg Oral Daily   polyethylene glycol  17 g Oral BID   Continuous Infusions:  iron sucrose 200 mg (12/06/21 1030)   PRN Meds:.diphenhydrAMINE, labetalol, LORazepam   I have personally reviewed following labs and imaging studies  LABORATORY DATA: CBC: Recent Labs  Lab 11/30/21 1707 11/30/21 1842 12/01/21 0251 12/03/21 0419 12/04/21 0333 12/05/21 0536 12/06/21 0255  WBC 12.8*   < > 14.0* 9.4 8.0 8.4 8.2  NEUTROABS 10.6*  --   --   --   --   --   --   HGB 11.3*   < > 10.8* 8.7* 7.9* 7.5* 7.1*  HCT 34.2*   < > 31.0* 26.0* 23.0* 22.4* 22.0*  MCV 79.4*   < > 77.7* 78.3* 79.3* 80.6 81.5  PLT 87*   < > 91* 128* 140* 140* 151   < > = values in this interval not displayed.     Basic Metabolic Panel: Recent Labs  Lab 11/30/21 2324 12/01/21 0251 12/02/21 0305 12/02/21 0950  12/03/21 0419 12/04/21 0333 12/05/21 0536 12/06/21 0255  NA 129* 129* 129*  --  130* 130* 128* 132*  K 3.0* 3.1* 2.8* 3.2* 2.8* 3.0* 3.7 3.8  CL 91* 89* 91*  --  94* 95* 96* 99  CO2 22 23 22   --  23 23 21* 20*  GLUCOSE 162* 177* 124*  --  97 132* 106* 118*  BUN 53* 55* 63*  --  68* 72* 76* 79*  CREATININE 6.33* 6.19* 7.84*  --  7.57* 7.21* 7.55* 7.65*  CALCIUM 9.5 9.3 8.9  --  8.7* 8.4* 8.6* 8.8*  MG 2.2 2.3  --   --  2.0 2.0  --   --   PHOS 4.8* 5.8*  --   --   --   --  5.0* 5.4*     GFR: Estimated Creatinine Clearance: 12.2 mL/min (A) (by C-G formula based on SCr of 7.65 mg/dL (H)).  Liver Function Tests: Recent Labs  Lab 11/30/21 2324 12/02/21 0305 12/03/21 0419 12/05/21  0086 12/06/21 0255  AST 27 15 15   --   --   ALT 13 11 9   --   --   ALKPHOS 104 99 89  --   --   BILITOT 0.8 0.4 0.5  --   --   PROT 9.1* 7.9 7.5  --   --   ALBUMIN 3.7 3.2* 3.0* 2.8* 2.7*    No results for input(s): LIPASE, AMYLASE in the last 168 hours. No results for input(s): AMMONIA in the last 168 hours.  Coagulation Profile: Recent Labs  Lab 11/30/21 1748  INR 1.2     Cardiac Enzymes: Recent Labs  Lab 11/30/21 2324  CKTOTAL 50     BNP (last 3 results) No results for input(s): PROBNP in the last 8760 hours.  Lipid Profile: No results for input(s): CHOL, HDL, LDLCALC, TRIG, CHOLHDL, LDLDIRECT in the last 72 hours.   Thyroid Function Tests: No results for input(s): TSH, T4TOTAL, FREET4, T3FREE, THYROIDAB in the last 72 hours.  Anemia Panel: Recent Labs    12/05/21 0536 12/05/21 0631  VITAMINB12 611  --   FOLATE  --  28.1  FERRITIN 233  --   TIBC 259  --   IRON 27*  --   RETICCTPCT 4.0*  --      Urine analysis:    Component Value Date/Time   COLORURINE YELLOW 11/30/2021 2108   APPEARANCEUR HAZY (A) 11/30/2021 2108   LABSPEC 1.017 11/30/2021 2108   PHURINE 5.0 11/30/2021 2108   GLUCOSEU NEGATIVE 11/30/2021 2108   HGBUR MODERATE (A) 11/30/2021 2108    BILIRUBINUR NEGATIVE 11/30/2021 2108   KETONESUR NEGATIVE 11/30/2021 2108   PROTEINUR >=300 (A) 11/30/2021 2108   NITRITE NEGATIVE 11/30/2021 2108   LEUKOCYTESUR NEGATIVE 11/30/2021 2108    Sepsis Labs: Lactic Acid, Venous    Component Value Date/Time   LATICACIDVEN 2.0 (HH) 11/30/2021 2324    MICROBIOLOGY: Recent Results (from the past 240 hour(s))  Resp Panel by RT-PCR (Flu A&B, Covid) Nasopharyngeal Swab     Status: None   Collection Time: 11/30/21  5:12 PM   Specimen: Nasopharyngeal Swab; Nasopharyngeal(NP) swabs in vial transport medium  Result Value Ref Range Status   SARS Coronavirus 2 by RT PCR NEGATIVE NEGATIVE Final    Comment: (NOTE) SARS-CoV-2 target nucleic acids are NOT DETECTED.  The SARS-CoV-2 RNA is generally detectable in upper respiratory specimens during the acute phase of infection. The lowest concentration of SARS-CoV-2 viral copies this assay can detect is 138 copies/mL. A negative result does not preclude SARS-Cov-2 infection and should not be used as the sole basis for treatment or other patient management decisions. A negative result may occur with  improper specimen collection/handling, submission of specimen other than nasopharyngeal swab, presence of viral mutation(s) within the areas targeted by this assay, and inadequate number of viral copies(<138 copies/mL). A negative result must be combined with clinical observations, patient history, and epidemiological information. The expected result is Negative.  Fact Sheet for Patients:  EntrepreneurPulse.com.au  Fact Sheet for Healthcare Providers:  IncredibleEmployment.be  This test is no t yet approved or cleared by the [place] FDA and  has been authorized for detection and/or diagnosis of SARS-CoV-2 by FDA under an Emergency Use Authorization (EUA). This EUA will remain  in effect (meaning this test can be used) for the duration of the COVID-19  declaration under Section 564(b)(1) of the Act, 21 U.S.C.section 360bbb-3(b)(1), unless the authorization is terminated  or revoked sooner.  Influenza A by PCR NEGATIVE NEGATIVE Final   Influenza B by PCR NEGATIVE NEGATIVE Final    Comment: (NOTE) The Xpert Xpress SARS-CoV-2/FLU/RSV plus assay is intended as an aid in the diagnosis of influenza from Nasopharyngeal swab specimens and should not be used as a sole basis for treatment. Nasal washings and aspirates are unacceptable for Xpert Xpress SARS-CoV-2/FLU/RSV testing.  Fact Sheet for Patients: EntrepreneurPulse.com.au  Fact Sheet for Healthcare Providers: IncredibleEmployment.be  This test is not yet approved or cleared by the [place] FDA and has been authorized for detection and/or diagnosis of SARS-CoV-2 by FDA under an Emergency Use Authorization (EUA). This EUA will remain in effect (meaning this test can be used) for the duration of the COVID-19 declaration under Section 564(b)(1) of the Act, 21 U.S.C. section 360bbb-3(b)(1), unless the authorization is terminated or revoked.  Performed at [facility], [place] [address], [place], [place] [address]   MRSA Next Gen by PCR, Nasal     Status: None   Collection Time: 11/30/21 11:11 PM   Specimen: Nasal Mucosa; Nasal Swab  Result Value Ref Range Status   MRSA by PCR Next Gen NOT DETECTED NOT DETECTED Final    Comment: (NOTE) The GeneXpert MRSA Assay (FDA approved for NASAL specimens only), is one component of a comprehensive MRSA colonization surveillance program. It is not intended to diagnose MRSA infection nor to guide or monitor treatment for MRSA infections. Test performance is not FDA approved in patients less than 24 years old. Performed at [facility], [place] [address], [place], [place] [address]     RADIOLOGY STUDIES/RESULTS: No results found.   LOS: 6 days   Oren Binet, MD  [facility]    To contact the attending provider between 7A-7P or the covering provider during after hours 7P-7A, please log into the web site [email or web address] and access using universal [place] password for that web site. If you do not have the password, please call the hospital operator.  12/06/2021, 12:20 PM

## 2021-12-07 ENCOUNTER — Other Ambulatory Visit (HOSPITAL_COMMUNITY): Payer: Self-pay

## 2021-12-07 ENCOUNTER — Telehealth: Payer: Self-pay

## 2021-12-07 LAB — RENAL FUNCTION PANEL
Albumin: 2.9 g/dL — ABNORMAL LOW (ref 3.5–5.0)
Anion gap: 12 (ref 5–15)
BUN: 84 mg/dL — ABNORMAL HIGH (ref 6–20)
CO2: 22 mmol/L (ref 22–32)
Calcium: 9.2 mg/dL (ref 8.9–10.3)
Chloride: 100 mmol/L (ref 98–111)
Creatinine, Ser: 7.61 mg/dL — ABNORMAL HIGH (ref 0.61–1.24)
GFR, Estimated: 8 mL/min — ABNORMAL LOW (ref >60)
Glucose, Bld: 102 mg/dL — ABNORMAL HIGH (ref 70–99)
Phosphorus: 5.8 mg/dL — ABNORMAL HIGH (ref 2.5–4.6)
Potassium: 4.4 mmol/L (ref 3.5–5.1)
Sodium: 134 mmol/L — ABNORMAL LOW (ref 135–145)

## 2021-12-07 LAB — CBC
HCT: 23.5 % — ABNORMAL LOW (ref 39.0–52.0)
Hemoglobin: 7.8 g/dL — ABNORMAL LOW (ref 13.0–17.0)
MCH: 26.7 pg (ref 26.0–34.0)
MCHC: 33.2 g/dL (ref 30.0–36.0)
MCV: 80.5 fL (ref 80.0–100.0)
Platelets: 168 10*3/uL (ref 150–400)
RBC: 2.92 MIL/uL — ABNORMAL LOW (ref 4.22–5.81)
RDW: 17.9 % — ABNORMAL HIGH (ref 11.5–15.5)
WBC: 8.3 10*3/uL (ref 4.0–10.5)
nRBC: 0 % (ref 0.0–0.2)

## 2021-12-07 MED ORDER — PANTOPRAZOLE SODIUM 40 MG PO TBEC
40.0000 mg | DELAYED_RELEASE_TABLET | Freq: Every day | ORAL | 4 refills | Status: DC
  Filled 2021-12-07 – 2022-02-01 (×3): qty 30, 30d supply, fill #0

## 2021-12-07 MED ORDER — AMLODIPINE BESYLATE 10 MG PO TABS
10.0000 mg | ORAL_TABLET | Freq: Every day | ORAL | 4 refills | Status: DC
  Filled 2021-12-07 – 2021-12-12 (×2): qty 30, 30d supply, fill #0

## 2021-12-07 MED ORDER — CLONIDINE HCL 0.1 MG PO TABS
0.1000 mg | ORAL_TABLET | Freq: Two times a day (BID) | ORAL | 11 refills | Status: DC
  Filled 2021-12-07 – 2021-12-12 (×2): qty 60, 30d supply, fill #0

## 2021-12-07 MED ORDER — CERTAVITE/ANTIOXIDANTS PO TABS
1.0000 | ORAL_TABLET | Freq: Every day | ORAL | 3 refills | Status: DC
  Filled 2021-12-07: qty 30, 30d supply, fill #0

## 2021-12-07 MED ORDER — LABETALOL HCL 300 MG PO TABS
300.0000 mg | ORAL_TABLET | Freq: Two times a day (BID) | ORAL | 11 refills | Status: DC
  Filled 2021-12-07 – 2021-12-12 (×2): qty 60, 30d supply, fill #0

## 2021-12-07 NOTE — Telephone Encounter (Signed)
Mrs  [facility] case manager called requested a [place]  for pt being discharged today with a 1 wk f/u date was given 3/17 with Dr Howie Ill @ 9:15  ?

## 2021-12-07 NOTE — Progress Notes (Signed)
[facility] ROUNDING NOTE   Subjective:   Interval History: This is a 46 year old gentleman with a history of stage IV chronic kidney disease with malignant hypertension and press syndrome.  Blood pressure 140/101 temperature 98 pulse 73 O2 sats 93% room air  sodium 134 potassium 4.4 chloride 100 CO2 22 BUN 84 creatinine 7.6 glucose 102 calcium 9.2 phosphorus 5.8 hemoglobin 7.8    Objective:  Vital signs in last 24 hours:  Temp:  [97.3 F (36.3 C)-99.2 F (37.3 C)] 98 F (36.7 C) (03/10 0400) Pulse Rate:  [71-91] 73 (03/10 0500) Resp:  [10-26] 17 (03/10 0500) BP: (110-150)/(75-96) 141/86 (03/10 0500) SpO2:  [95 %-100 %] 99 % (03/10 0500)  Weight change:  Filed Weights   11/30/21 2252 11/30/21 2312  Weight: 76 kg 69.9 kg    Intake/Output: I/O last 3 completed shifts: In: 590 [P.O.:480; IV Piggyback:110] Out: 5100 [Urine:5100]   Intake/Output this shift:  No intake/output data recorded.  CVS- RRR no murmurs rubs or gallops RS- CTA no wheezes rales ABD- BS present soft non-distended EXT- no edema   Basic Metabolic Panel: Recent Labs  Lab 11/30/21 2324 12/01/21 0251 12/02/21 0305 12/03/21 0419 12/04/21 0333 12/05/21 0536 12/06/21 0255 12/07/21 0638  NA 129* 129*   < > 130* 130* 128* 132* 134*  K 3.0* 3.1*   < > 2.8* 3.0* 3.7 3.8 4.4  CL 91* 89*   < > 94* 95* 96* 99 100  CO2 22 23   < > 23 23 21* 20* 22  GLUCOSE 162* 177*   < > 97 132* 106* 118* 102*  BUN 53* 55*   < > 68* 72* 76* 79* 84*  CREATININE 6.33* 6.19*   < > 7.57* 7.21* 7.55* 7.65* 7.61*  CALCIUM 9.5 9.3   < > 8.7* 8.4* 8.6* 8.8* 9.2  MG 2.2 2.3  --  2.0 2.0  --   --   --   PHOS 4.8* 5.8*  --   --   --  5.0* 5.4* 5.8*   < > = values in this interval not displayed.     Liver Function Tests: Recent Labs  Lab 11/30/21 2324 12/02/21 0305 12/03/21 0419 12/05/21 0536 12/06/21 0255 12/07/21 0638  AST 27 15 15   --   --   --   ALT 13 11 9   --   --   --   ALKPHOS 104 99 89  --    --   --   BILITOT 0.8 0.4 0.5  --   --   --   PROT 9.1* 7.9 7.5  --   --   --   ALBUMIN 3.7 3.2* 3.0* 2.8* 2.7* 2.9*    No results for input(s): LIPASE, AMYLASE in the last 168 hours. No results for input(s): AMMONIA in the last 168 hours.  CBC: Recent Labs  Lab 11/30/21 1707 11/30/21 1842 12/03/21 0419 12/04/21 0333 12/05/21 0536 12/06/21 0255 12/07/21 0638  WBC 12.8*   < > 9.4 8.0 8.4 8.2 8.3  NEUTROABS 10.6*  --   --   --   --   --   --   HGB 11.3*   < > 8.7* 7.9* 7.5* 7.1* 7.8*  HCT 34.2*   < > 26.0* 23.0* 22.4* 22.0* 23.5*  MCV 79.4*   < > 78.3* 79.3* 80.6 81.5 80.5  PLT 87*   < > 128* 140* 140* 151 168   < > = values in this interval not displayed.  Cardiac Enzymes: Recent Labs  Lab 11/30/21 2324  CKTOTAL 50     BNP: Invalid input(s): POCBNP  CBG: No results for input(s): GLUCAP in the last 168 hours.  Microbiology: Results for orders placed or performed during the hospital encounter of 11/30/21  Resp Panel by RT-PCR (Flu A&B, Covid) Nasopharyngeal Swab     Status: None   Collection Time: 11/30/21  5:12 PM   Specimen: Nasopharyngeal Swab; Nasopharyngeal(NP) swabs in vial transport medium  Result Value Ref Range Status   SARS Coronavirus 2 by RT PCR NEGATIVE NEGATIVE Final    Comment: (NOTE) SARS-CoV-2 target nucleic acids are NOT DETECTED.  The SARS-CoV-2 RNA is generally detectable in upper respiratory specimens during the acute phase of infection. The lowest concentration of SARS-CoV-2 viral copies this assay can detect is 138 copies/mL. A negative result does not preclude SARS-Cov-2 infection and should not be used as the sole basis for treatment or other patient management decisions. A negative result may occur with  improper specimen collection/handling, submission of specimen other than nasopharyngeal swab, presence of viral mutation(s) within the areas targeted by this assay, and inadequate number of viral copies(<138 copies/mL). A  negative result must be combined with clinical observations, patient history, and epidemiological information. The expected result is Negative.  Fact Sheet for Patients:  EntrepreneurPulse.com.au  Fact Sheet for Healthcare Providers:  IncredibleEmployment.be  This test is no t yet approved or cleared by the [place] FDA and  has been authorized for detection and/or diagnosis of SARS-CoV-2 by FDA under an Emergency Use Authorization (EUA). This EUA will remain  in effect (meaning this test can be used) for the duration of the COVID-19 declaration under Section 564(b)(1) of the Act, 21 U.S.C.section 360bbb-3(b)(1), unless the authorization is terminated  or revoked sooner.       Influenza A by PCR NEGATIVE NEGATIVE Final   Influenza B by PCR NEGATIVE NEGATIVE Final    Comment: (NOTE) The Xpert Xpress SARS-CoV-2/FLU/RSV plus assay is intended as an aid in the diagnosis of influenza from Nasopharyngeal swab specimens and should not be used as a sole basis for treatment. Nasal washings and aspirates are unacceptable for Xpert Xpress SARS-CoV-2/FLU/RSV testing.  Fact Sheet for Patients: EntrepreneurPulse.com.au  Fact Sheet for Healthcare Providers: IncredibleEmployment.be  This test is not yet approved or cleared by the [place] FDA and has been authorized for detection and/or diagnosis of SARS-CoV-2 by FDA under an Emergency Use Authorization (EUA). This EUA will remain in effect (meaning this test can be used) for the duration of the COVID-19 declaration under Section 564(b)(1) of the Act, 21 U.S.C. section 360bbb-3(b)(1), unless the authorization is terminated or revoked.  Performed at [facility], [place] [address], [place], [place] [address]   MRSA Next Gen by PCR, Nasal     Status: None   Collection Time: 11/30/21 11:11 PM   Specimen: Nasal Mucosa; Nasal Swab  Result Value  Ref Range Status   MRSA by PCR Next Gen NOT DETECTED NOT DETECTED Final    Comment: (NOTE) The GeneXpert MRSA Assay (FDA approved for NASAL specimens only), is one component of a comprehensive MRSA colonization surveillance program. It is not intended to diagnose MRSA infection nor to guide or monitor treatment for MRSA infections. Test performance is not FDA approved in patients less than 12 years old. Performed at [facility], [place] [address], [place], [place] [address]     Coagulation Studies: No results for input(s): LABPROT, INR in the last  72 hours.   Urinalysis: No results for input(s): COLORURINE, LABSPEC, PHURINE, GLUCOSEU, HGBUR, BILIRUBINUR, KETONESUR, PROTEINUR, UROBILINOGEN, NITRITE, LEUKOCYTESUR in the last 72 hours.  Invalid input(s): APPERANCEUR     Imaging: No results found.   Medications:    iron sucrose Stopped (12/06/21 1101)     amLODipine  10 mg Oral Daily   aspirin EC  81 mg Oral Daily   Chlorhexidine Gluconate Cloth  6 each Topical Daily   cloNIDine  0.1 mg Oral BID   docusate sodium  100 mg Oral BID   feeding supplement (NEPRO CARB STEADY)  237 mL Oral BID BM   heparin injection (subcutaneous)  5,000 Units Subcutaneous Q8H   labetalol  300 mg Oral BID   multivitamin  1 tablet Oral QHS   pantoprazole  40 mg Oral Daily   polyethylene glycol  17 g Oral BID   diphenhydrAMINE, labetalol, LORazepam  Assessment/ Plan:  Stage IV/V acute on chronic kidney disease.  With accelerated hypertension high risk to progression to dialysis.  There appears to be no indication for dialysis at this point.  It would be nice to have vascular access placed while patient is in the hospital.  If patient is agreeable we will consult vein and vascular surgery. 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  Blood pressure control improved Borderline  nephrotic range proteinuria. Very high risk for progression to end-stage renal disease would like to obtain vascular surgery consultation if possible to see if we can have a fistula placed while patient is in the hospital.   LOS: [place] @TODAY @7 :41 AM

## 2021-12-07 NOTE — Progress Notes (Signed)
Patient given discharge instructions, questions answered, IV removed. TOC meds delivered to bedside. Patient taken to discharge lounge to wait for ride.  ?

## 2021-12-07 NOTE — Discharge Summary (Signed)
PATIENT DETAILS Name: Roy Randolph Age: 46 y.o. Sex: male Date of Birth: 07-26-1976 MRN: [id number]. Admitting Physician: Samara Deist, MD OIZ:[email or web address], [name], MD  Admit Date: 11/30/2021 Discharge date: 12/07/2021  Recommendations for Outpatient Follow-up:  Follow up with PCP in 1-2 weeks Please obtain CMP/CBC in one week Please ensure follow up with nephrology.  Admitted From:  Home  Disposition: Home   Discharge Condition: good  CODE STATUS:   Code Status: Full Code   Diet recommendation:  Diet Order             Diet - low sodium heart healthy           Diet 2 gram sodium Room service appropriate? Yes; Fluid consistency: Thin  Diet effective now                    Brief Summary: Patient is a 46 y.o.  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  Admitted to the [facility] started on Cardene infusion.  Upon stability-transfer to the [facility] hospitalist service.   Significant Hospital events: 3/3>> presenting with headaches/visual problems-uncontrolled HTN-AKI-admit to ICU for Cardene infusion.  MRI suggestive of PRES. 3/7>> transfer to [facility]   Significant imaging studies: 3/3>> MRI brain: Findings suggestive of PRES. 3/6>> MRI abdomen: No adrenal mass, 1.5 cm complex cyst involving left kidney   Significant microbiology data: 3/3>> COVID/influenza PCR: Negative   Procedures: None   Consults:  PCCM, nephrology, neurology, cardiology  Brief Hospital Course: AKI on CKD stage IV: AKI likely hemodynamically mediated-in the setting of uncontrolled hypertension.  Although creatinine still significantly elevated-no acute indications to start hemodialysis-has very good urine output.  His creatinine has been very stable for the past  several days.  Discussed with nephrologist-Dr. Justin Mend on 3/10-patient follows with Dr. Candiss Norse as an outpatient-okay to discharge today-nephrology office will get him a follow-up appointment for close monitoring-repeat labs-and to arrange for outpatient HD access.   Hypertensive emergency: Resolved-required Cardene infusion and ICU monitoring.  Now on oral antihypertensives-see below.   PRES: Due to above-headache resolved-had significant visual issues when he first presented-that has since resolved and he is now back to his baseline (blurry vision at baseline since discharge from the hospital in [place] is scheduling a appointment with his ophthalmologist)    HTN: Better controlled-continue current dosing of labetalol/clonidine and amlodipine.  Follow and adjust.     Microcytic anemia: Suspect related to CKD-defer Aranesp/IV iron to nephrology service.    Hyponatremia: Mild-probably due to impaired free water excretion in the setting of AKI.  Watch closely.  Hypokalemia: Repleted. Prior aldosterone/PRA ratio not consistent with primary hyperaldosteronism.   HFpEF: Volume status stable   ?  OSA: PCCM planning outpatient sleep study.   Nutrition Status: Nutrition Problem: Inadequate oral intake Etiology: acute illness Signs/Symptoms: NPO status Interventions: MVI, Refer to RD note for recommendations, Education   BMI: Estimated body mass index is 22.76 kg/m as calculated from the following:   Height as of this encounter: 5\' 9"  (1.753 m).   Weight as of this encounter: 69.9 kg.   Discharge Diagnoses:  Principal Problem:   Hypertensive emergency due to non compliance Active Problems:   Acute renal failure superimposed on stage 4 chronic kidney disease [facility])   Discharge Instructions:  Activity:  As tolerated  Discharge Instructions     Call MD for:  difficulty breathing, headache or visual disturbances   Complete by: As directed    Diet - low sodium heart healthy   Complete  by: As directed    Discharge instructions   Complete by: As directed    Follow with Primary MD  Ladell Pier, MD in 1-2 weeks  You will get a call from [facility] (Dr. Howard Pouch a follow-up appointment.  Please continue taking your blood pressure medications.  Please get a complete blood count and chemistry panel checked by your Primary MD at your next visit, and again as instructed by your Primary MD.  Get Medicines reviewed and adjusted: Please take all your medications with you for your next visit with your Primary MD  Laboratory/radiological data: Please request your Primary MD to go over all hospital tests and procedure/radiological results at the follow up, please ask your Primary MD to get all Hospital records sent to his/her office.  In some cases, they will be blood work, cultures and biopsy results pending at the time of your discharge. Please request that your primary care M.D. follows up on these results.  Also Note the following: If you experience worsening of your admission symptoms, develop shortness of breath, life threatening emergency, suicidal or homicidal thoughts you must seek medical attention immediately by calling 911 or calling your MD immediately  if symptoms less severe.  You must read complete instructions/literature along with all the possible adverse reactions/side effects for all the Medicines you take and that have been prescribed to you. Take any new Medicines after you have completely understood and accpet all the possible adverse reactions/side effects.   Do not drive when taking Pain medications or sleeping medications (Benzodaizepines)  Do not take more than prescribed Pain, Sleep and Anxiety Medications. It is not advisable to combine anxiety,sleep and pain medications without talking with your primary care practitioner  Special Instructions: If you have smoked or chewed Tobacco  in the last 2 yrs please stop smoking, stop any regular  Alcohol  and or any Recreational drug use.  Wear Seat belts while driving.  Please note: You were cared for by a hospitalist during your hospital stay. Once you are discharged, your primary care physician will handle any further medical issues. Please note that NO REFILLS for any discharge medications will be authorized once you are discharged, as it is imperative that you return to your primary care physician (or establish a relationship with a primary care physician if you do not have one) for your post hospital discharge needs so that they can reassess your need for medications and monitor your lab values.   Increase activity slowly   Complete by: As directed       Allergies as of 12/07/2021       Reactions   Cucumber Extract Anaphylaxis, Itching   Other Anaphylaxis, Itching, Swelling, Other (See Comments)   The patient CANNOT eat raw/fresh fruit or vegetables. Reaction starts with ears itching, then progresses from there. He can eat canned versions of these foods, however.   Tomato (diagnostic) Anaphylaxis, Swelling   Wild Lettuce [lactuca Virosa] Anaphylaxis, Swelling   Hydralazine Hives, Itching, Other (See Comments)   SEVERE ITCHING        Medication List     STOP taking these medications    amitriptyline 25 MG tablet Commonly known as: ELAVIL   B-complex with vitamin C tablet   butalbital-acetaminophen-caffeine 50-325-40 MG tablet Commonly known as: FIORICET  CALCIUM PO   carvedilol 25 MG tablet Commonly known as: COREG   cyanocobalamin 1000 MCG tablet   hydrALAZINE 50 MG tablet Commonly known as: APRESOLINE   methocarbamol 500 MG tablet Commonly known as: ROBAXIN   spironolactone 50 MG tablet Commonly known as: ALDACTONE   SUMAtriptan 25 MG tablet Commonly known as: IMITREX       TAKE these medications    acetaminophen 325 MG tablet Commonly known as: TYLENOL Take 325-650 mg by mouth every 6 (six) hours as needed for mild pain or headache.    amLODipine 10 MG tablet Commonly known as: NORVASC Take 1 tablet (10 mg total) by mouth daily.   cloNIDine 0.1 MG tablet Commonly known as: CATAPRES Take 1 tablet (0.1 mg total) by mouth 2 (two) times daily.   labetalol 300 MG tablet Commonly known as: NORMODYNE Take 1 tablet (300 mg total) by mouth 2 (two) times daily.   multivitamin Tabs tablet Take 1 tablet by mouth at bedtime.   pantoprazole 40 MG tablet Commonly known as: PROTONIX Take 1 tablet (40 mg total) by mouth daily.   VITAMIN C PO Take 1 tablet by mouth daily.        Allergies  Allergen Reactions   Cucumber Extract Anaphylaxis and Itching   Other Anaphylaxis, Itching, Swelling and Other (See Comments)    The patient CANNOT eat raw/fresh fruit or vegetables. Reaction starts with ears itching, then progresses from there. He can eat canned versions of these foods, however.   Tomato (Diagnostic) Anaphylaxis and Swelling   Wild Lettuce [Lactuca Virosa] Anaphylaxis and Swelling   Hydralazine Hives, Itching and Other (See Comments)    SEVERE ITCHING     Other Procedures/Studies: DG Chest 2 View  Result Date: 11/30/2021 CLINICAL DATA:  Provided history: Chest pain. EXAM: CHEST - 2 VIEW COMPARISON:  Chest radiographs 10/10/2021. FINDINGS: Heart size within normal limits. Focus of linear atelectasis versus scarring within the right lung base. No appreciable airspace consolidation. No evidence of pleural effusion or pneumothorax. No acute bony abnormality identified. IMPRESSION: Focus of linear atelectasis versus scarring within the lateral right lung base. Otherwise, no evidence of acute cardiopulmonary abnormality. Electronically Signed   By: Kellie Simmering D.O.   On: 11/30/2021 17:30   CT HEAD WO CONTRAST (5MM)  Result Date: 11/30/2021 CLINICAL DATA:  Provided history: Diplopia, headache, blurred vision, hypertension. EXAM: CT HEAD WITHOUT CONTRAST TECHNIQUE: Contiguous axial images were obtained from the base of the  skull through the vertex without intravenous contrast. RADIATION DOSE REDUCTION: This exam was performed according to the departmental dose-optimization program which includes automated exposure control, adjustment of the mA and/or kV according to patient size and/or use of iterative reconstruction technique. COMPARISON:  Head CT 10/10/2021. FINDINGS: Brain: Cerebral volume is normal. There is symmetric abnormal cortical/subcortical hypodensity within the bilateral occipital lobes. Additionally, there is apparent mild abnormal hypodensity within the right cerebellar hemisphere (for instance as seen on series 2, image 6). There is no acute intracranial hemorrhage. No extra-axial fluid collection. No evidence of an intracranial mass. No midline shift. Vascular: No hyperdense vessel. Skull: Normal. Negative for fracture or focal lesion. Sinuses/Orbits: Visualized orbits show no acute finding. No significant paranasal sinus disease at the imaged levels. These results were called by telephone at the time of interpretation on 11/30/2021 at 5:40 pm to provider Dr. Roderic Palau, who verbally acknowledged these results. IMPRESSION: Symmetric abnormal cortical/subcortical hypodensity within the bilateral occipital lobes. Additionally, there is apparent mild abnormal hypodensity within the right cerebellar  hemisphere. 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 A brain MRI is recommended for further evaluation and to exclude superimposed acute infarcts at these sites. Electronically Signed   By: Kellie Simmering D.O.   On: 11/30/2021 17:42   MR BRAIN WO CONTRAST  Result Date: 11/30/2021 CLINICAL DATA:  Encephalopathy EXAM: MRI HEAD WITHOUT CONTRAST TECHNIQUE: Multiplanar, multiecho pulse sequences of the brain and surrounding structures were obtained without intravenous contrast. COMPARISON:  Head CT 11/30/2021 and  10/10/2021 FINDINGS: Brain: No acute infarct, mass effect or extra-axial collection. Multifocal chronic microhemorrhage within both occipital lobes and the cerebellum. There is hyperintense T2-weighted signal within the cerebellum and both occipital lobes. No abnormal volume loss. The midline structures are normal. Vascular: Major flow voids are preserved. Skull and upper cervical spine: Normal calvarium and skull base. Visualized upper cervical spine and soft tissues are normal. Sinuses/Orbits:No paranasal sinus fluid levels or advanced mucosal thickening. No mastoid or middle ear effusion. Normal orbits. IMPRESSION: 1. No acute intracranial abnormality. 2. Hyperintense T2-weighted signal and multifocal chronic microhemorrhage within both occipital lobes and cerebellar hemispheres. Given history of malignant hypertension, these findings are favored to be sequelae of a prior episode of PRES. Gliosis related to a prior traumatic injury is a less likely alternative possibility. Electronically Signed   By: Ulyses Jarred M.D.   On: 11/30/2021 23:22   MR ABDOMEN WO CONTRAST  Result Date: 12/03/2021 CLINICAL DATA:  Hypertension, possible adrenal lesion EXAM: MRI ABDOMEN WITHOUT CONTRAST TECHNIQUE: Multiplanar multisequence MR imaging was performed without the administration of intravenous contrast. COMPARISON:  None. FINDINGS: Study is limited due to motion. Lower chest: Unremarkable. Hepatobiliary: Liver is normal in size and contour with no suspicious mass visualized. Subcentimeter hyperintense T2 signal likely cyst in the left hepatic lobe. Gallbladder appears normal. No biliary ductal dilatation. Pancreas: No mass, inflammatory changes, or other parenchymal abnormality identified. Spleen:  Within normal limits in size and appearance. Adrenals/Urinary Tract: Adrenal mass identified. 1.5 cm mildly complex cyst in the left kidney with thin internal septations. 0.8 cm simple appearing cyst in the right kidney. No  hydronephrosis. Stomach/Bowel: No evidence of bowel obstruction. Vascular/Lymphatic: No pathologically enlarged lymph nodes identified. No abdominal aortic aneurysm demonstrated. Other:  No ascites. Musculoskeletal: No suspicious bone lesions identified. IMPRESSION: 1. No adrenal mass or acute process identified. 2. 1.5 cm mildly complex cyst in the left kidney. Electronically Signed   By: Ofilia Neas M.D.   On: 12/03/2021 19:31     TODAY-DAY OF DISCHARGE:  Subjective:   Roy Randolph today has no headache,no chest abdominal pain,no new weakness tingling or numbness, feels much better wants to go home today.  Objective:   Blood pressure (!) 145/104, pulse 77, temperature 98.6 F (37 C), temperature source Oral, resp. rate 20, height 5\' 9"  (1.753 m), weight 69.9 kg, SpO2 100 %.  Intake/Output Summary (Last 24 hours) at 12/07/2021 0937 Last data filed at 12/07/2021 0500 Gross per 24 hour  Intake 350 ml  Output 3375 ml  Net -3025 ml   Filed Weights   11/30/21 2252 11/30/21 2312  Weight: 76 kg 69.9 kg    Exam: Awake Alert, Oriented *3, No new F.N deficits, Normal affect [place].AT,PERRAL Supple Neck,No JVD, No cervical lymphadenopathy appriciated.  Symmetrical Chest wall movement, Good air movement bilaterally, CTAB RRR,No Gallops,Rubs or new Murmurs, No Parasternal Heave +ve B.Sounds, Abd Soft, Non tender, No organomegaly appriciated, No rebound -guarding or rigidity.  No Cyanosis, Clubbing or edema, No new Rash or bruise   PERTINENT RADIOLOGIC STUDIES: No results found.   PERTINENT LAB RESULTS: CBC: Recent Labs    12/06/21 0255 12/07/21 0638  WBC 8.2 8.3  HGB 7.1* 7.8*  HCT 22.0* 23.5*  PLT 151 168   CMET CMP     Component Value Date/Time   NA 134 (L) 12/07/2021 0638   NA 138 11/12/2021 1529   K 4.4 12/07/2021 0638   CL 100 12/07/2021 0638   CO2 22 12/07/2021 0638   GLUCOSE 102 (H) 12/07/2021 0638   BUN 84 (H) 12/07/2021 0638   BUN 33 (H) 11/12/2021 1529    CREATININE 7.61 (H) 12/07/2021 0638   CALCIUM 9.2 12/07/2021 0638   PROT 7.5 12/03/2021 0419   ALBUMIN 2.9 (L) 12/07/2021 0638   AST 15 12/03/2021 0419   ALT 9 12/03/2021 0419   ALKPHOS 89 12/03/2021 0419   BILITOT 0.5 12/03/2021 0419   GFRNONAA 8 (L) 12/07/2021 0638    GFR Estimated Creatinine Clearance: 12.2 mL/min (A) (by C-G formula based on SCr of 7.61 mg/dL (H)). No results for input(s): LIPASE, AMYLASE in the last 72 hours. No results for input(s): CKTOTAL, CKMB, CKMBINDEX, TROPONINI in the last 72 hours. Invalid input(s): POCBNP No results for input(s): DDIMER in the last 72 hours. No results for input(s): HGBA1C in the last 72 hours. No results for input(s): CHOL, HDL, LDLCALC, TRIG, CHOLHDL, LDLDIRECT in the last 72 hours. No results for input(s): TSH, T4TOTAL, T3FREE, THYROIDAB in the last 72 hours.  Invalid input(s): FREET3 Recent Labs    12/05/21 0536 12/05/21 0631  VITAMINB12 611  --   FOLATE  --  28.1  FERRITIN 233  --   TIBC 259  --   IRON 27*  --   RETICCTPCT 4.0*  --    Coags: No results for input(s): INR in the last 72 hours.  Invalid input(s): PT Microbiology: Recent Results (from the past 240 hour(s))  Resp Panel by RT-PCR (Flu A&B, Covid) Nasopharyngeal Swab     Status: None   Collection Time: 11/30/21  5:12 PM   Specimen: Nasopharyngeal Swab; Nasopharyngeal(NP) swabs in vial transport medium  Result Value Ref Range Status   SARS Coronavirus 2 by RT PCR NEGATIVE NEGATIVE Final    Comment: (NOTE) SARS-CoV-2 target nucleic acids are NOT DETECTED.  The SARS-CoV-2 RNA is generally detectable in upper respiratory specimens during the acute phase of infection. The lowest concentration of SARS-CoV-2 viral copies this assay can detect is 138 copies/mL. A negative result does not preclude SARS-Cov-2 infection and should not be used as the sole basis for treatment or other patient management decisions. A negative result may occur with  improper  specimen collection/handling, submission of specimen other than nasopharyngeal swab, presence of viral mutation(s) within the areas targeted by this assay, and inadequate number of viral copies(<138 copies/mL). A negative result must be combined with clinical observations, patient history, and epidemiological information. The expected result is Negative.  Fact Sheet for Patients:  EntrepreneurPulse.com.au  Fact Sheet for Healthcare Providers:  IncredibleEmployment.be  This test is no t yet approved or cleared by the [place] FDA and  has been authorized for detection and/or diagnosis of SARS-CoV-2 by FDA under an Emergency Use Authorization (EUA). This EUA will remain  in effect (meaning this test can be used) for the duration of the COVID-19 declaration under Section 564(b)(1) of the Act, 21 U.S.C.section 360bbb-3(b)(1), unless the authorization is terminated  or revoked sooner.  Influenza A by PCR NEGATIVE NEGATIVE Final   Influenza B by PCR NEGATIVE NEGATIVE Final    Comment: (NOTE) The Xpert Xpress SARS-CoV-2/FLU/RSV plus assay is intended as an aid in the diagnosis of influenza from Nasopharyngeal swab specimens and should not be used as a sole basis for treatment. Nasal washings and aspirates are unacceptable for Xpert Xpress SARS-CoV-2/FLU/RSV testing.  Fact Sheet for Patients: EntrepreneurPulse.com.au  Fact Sheet for Healthcare Providers: IncredibleEmployment.be  This test is not yet approved or cleared by the [place] FDA and has been authorized for detection and/or diagnosis of SARS-CoV-2 by FDA under an Emergency Use Authorization (EUA). This EUA will remain in effect (meaning this test can be used) for the duration of the COVID-19 declaration under Section 564(b)(1) of the Act, 21 U.S.C. section 360bbb-3(b)(1), unless the authorization is terminated or revoked.  Performed at  [facility], [place] [address], [place], [place] [address]   MRSA Next Gen by PCR, Nasal     Status: None   Collection Time: 11/30/21 11:11 PM   Specimen: Nasal Mucosa; Nasal Swab  Result Value Ref Range Status   MRSA by PCR Next Gen NOT DETECTED NOT DETECTED Final    Comment: (NOTE) The GeneXpert MRSA Assay (FDA approved for NASAL specimens only), is one component of a comprehensive MRSA colonization surveillance program. It is not intended to diagnose MRSA infection nor to guide or monitor treatment for MRSA infections. Test performance is not FDA approved in patients less than 58 years old. Performed at [facility], [place] [address] [place], [place] [address]     FURTHER DISCHARGE INSTRUCTIONS:  Get Medicines reviewed and adjusted: Please take all your medications with you for your next visit with your Primary MD  Laboratory/radiological data: Please request your Primary MD to go over all hospital tests and procedure/radiological results at the follow up, please ask your Primary MD to get all Hospital records sent to his/her office.  In some cases, they will be blood work, cultures and biopsy results pending at the time of your discharge. Please request that your primary care M.D. goes through all the records of your hospital data and follows up on these results.  Also Note the following: If you experience worsening of your admission symptoms, develop shortness of breath, life threatening emergency, suicidal or homicidal thoughts you must seek medical attention immediately by calling 911 or calling your MD immediately  if symptoms less severe.  You must read complete instructions/literature along with all the possible adverse reactions/side effects for all the Medicines you take and that have been prescribed to you. Take any new Medicines after you have completely understood and accpet all the possible adverse reactions/side effects.   Do not drive when  taking Pain medications or sleeping medications (Benzodaizepines)  Do not take more than prescribed Pain, Sleep and Anxiety Medications. It is not advisable to combine anxiety,sleep and pain medications without talking with your primary care practitioner  Special Instructions: If you have smoked or chewed Tobacco  in the last 2 yrs please stop smoking, stop any regular Alcohol  and or any Recreational drug use.  Wear Seat belts while driving.  Please note: You were cared for by a hospitalist during your hospital stay. Once you are discharged, your primary care physician will handle any further medical issues. Please note that NO REFILLS for any discharge medications will be authorized once you are discharged, as it is imperative that you return to your primary care physician (  or establish a relationship with a primary care physician if you do not have one) for your post hospital discharge needs so that they can reassess your need for medications and monitor your lab values.  Total Time spent coordinating discharge including counseling, education and face to face time equals greater than 30 minutes.  SignedOren Binet 12/07/2021 9:37 AM

## 2021-12-10 ENCOUNTER — Telehealth: Payer: Self-pay

## 2021-12-10 NOTE — Telephone Encounter (Signed)
Transition Care Management Follow-up Telephone Call ?Date of discharge and from where: 12/07/2021, [facility]  ?How have you been since you were released from the hospital? He said he is just very tired and feels sluggish. ?He stated that he needs to go to DSS and apply for [facility] Medicaid. His Medicaid is currently out of state.  ?Any questions or concerns? No ? ?Items Reviewed: ?Did the pt receive and understand the discharge instructions provided? Yes  - but he did not have them with him at the time of this call.  ?Medications obtained and verified? Yes - he has all medications. The list was reviewed.  ?Other? No  ?Any new allergies since your discharge? No  ?Dietary orders reviewed? No ?Do you have support at home? Yes  ? ?Home Care and Equipment/Supplies: ?Were home health services ordered? no ?If so, what is the name of the agency? N/a  ?Has the agency set up a time to come to the patient's home? not applicable ?Were any new equipment or medical supplies ordered?  No ?What is the name of the medical supply agency? N/a ?Were you able to get the supplies/equipment? not applicable ?Do you have any questions related to the use of the equipment or supplies? No ? ?Functional Questionnaire: (I = Independent and D = Dependent) ?ADLs: independent ? ? ?Follow up appointments reviewed: ? ?PCP Hospital f/u appt confirmed?  He has an appointment at [facility] - 12/14/2021.  He plans to keep that appointment and will call this clinic if  he decides to schedule any further visits at [facility] f/u appt confirmed?  None scheduled at this time    ?Are transportation arrangements needed? Yes  ?If their condition worsens, is the pt aware to call PCP or go to the Emergency Dept.? Yes ?Was the patient provided with contact information for the PCP's office or ED? Yes ?Was to pt encouraged to call back with questions or concerns? Yes ? ?

## 2021-12-12 ENCOUNTER — Telehealth (HOSPITAL_COMMUNITY): Payer: Self-pay

## 2021-12-12 ENCOUNTER — Other Ambulatory Visit: Payer: Self-pay

## 2021-12-12 ENCOUNTER — Other Ambulatory Visit (HOSPITAL_COMMUNITY): Payer: Self-pay

## 2021-12-12 NOTE — Telephone Encounter (Signed)
Pharmacy Transitions of Care Follow-up Telephone Call ? ?Date of discharge: 12/07/21  ?Discharge Diagnosis: Hypertensive Emergency ? ?How have you been since you were released from the hospital? Patient has been doing well but very tired, no questions about meds at this time.  ? ?Medication changes made at discharge: ?    START taking: ?CertaVite/Antioxidants  ?labetalol (NORMODYNE)  ?STOP taking: ?amitriptyline 25 MG tablet (ELAVIL)  ?B-complex with vitamin C tablet  ?butalbital-acetaminophen-caffeine 50-325-40 MG tablet (FIORICET)  ?CALCIUM PO  ?carvedilol 25 MG tablet (COREG)  ?cyanocobalamin 1000 MCG tablet  ?hydrALAZINE 50 MG tablet (APRESOLINE)  ?methocarbamol 500 MG tablet (ROBAXIN)  ?spironolactone 50 MG tablet (ALDACTONE)  ?SUMAtriptan 25 MG tablet (IMITREX)  ? ?Medication changes verified by the patient? Yes ?  ? ?Medication Accessibility: ? ?Home Pharmacy: [facility] ? ?Was the patient provided with refills on discharged medications? Yes  ? ?Have all prescriptions been transferred from [facility] to home pharmacy? Yes  ? ?Is the patient able to afford medications? Has [place] Medicaid ?  ? ?Follow-up Appointments: ? ?PCP Hospital f/u appt confirmed? Scheduled to see Dr. Howie Ill on 12/14/21 @ 9:15am.  ? ? ?If their condition worsens, is the pt aware to call PCP or go to the Emergency Dept.? Yes ? ?Final Patient Assessment: ?Patient has refills at home pharmacy and f/u scheduled ? ?

## 2021-12-14 ENCOUNTER — Encounter: Payer: Self-pay | Admitting: Internal Medicine

## 2021-12-14 ENCOUNTER — Ambulatory Visit (INDEPENDENT_AMBULATORY_CARE_PROVIDER_SITE_OTHER): Payer: Medicaid - Out of State | Admitting: Internal Medicine

## 2021-12-14 ENCOUNTER — Telehealth: Payer: Self-pay | Admitting: Pulmonary Disease

## 2021-12-14 ENCOUNTER — Other Ambulatory Visit (HOSPITAL_COMMUNITY): Payer: Self-pay

## 2021-12-14 VITALS — BP 142/97 | HR 77 | Temp 98.2°F | Ht 69.0 in | Wt 161.7 lb

## 2021-12-14 DIAGNOSIS — K2970 Gastritis, unspecified, without bleeding: Secondary | ICD-10-CM | POA: Diagnosis not present

## 2021-12-14 DIAGNOSIS — F419 Anxiety disorder, unspecified: Secondary | ICD-10-CM

## 2021-12-14 DIAGNOSIS — I120 Hypertensive chronic kidney disease with stage 5 chronic kidney disease or end stage renal disease: Secondary | ICD-10-CM | POA: Diagnosis present

## 2021-12-14 DIAGNOSIS — I10 Essential (primary) hypertension: Secondary | ICD-10-CM

## 2021-12-14 DIAGNOSIS — Z87891 Personal history of nicotine dependence: Secondary | ICD-10-CM

## 2021-12-14 DIAGNOSIS — N186 End stage renal disease: Secondary | ICD-10-CM | POA: Insufficient documentation

## 2021-12-14 DIAGNOSIS — D649 Anemia, unspecified: Secondary | ICD-10-CM

## 2021-12-14 DIAGNOSIS — Z992 Dependence on renal dialysis: Secondary | ICD-10-CM | POA: Diagnosis not present

## 2021-12-14 DIAGNOSIS — I129 Hypertensive chronic kidney disease with stage 1 through stage 4 chronic kidney disease, or unspecified chronic kidney disease: Secondary | ICD-10-CM

## 2021-12-14 DIAGNOSIS — F4323 Adjustment disorder with mixed anxiety and depressed mood: Secondary | ICD-10-CM | POA: Insufficient documentation

## 2021-12-14 MED ORDER — AMLODIPINE BESYLATE 10 MG PO TABS
10.0000 mg | ORAL_TABLET | Freq: Every day | ORAL | 4 refills | Status: DC
  Filled 2021-12-14: qty 30, 30d supply, fill #0

## 2021-12-14 MED ORDER — CLONIDINE HCL 0.1 MG PO TABS
0.1000 mg | ORAL_TABLET | Freq: Two times a day (BID) | ORAL | 11 refills | Status: DC
  Filled 2021-12-14: qty 60, 30d supply, fill #0

## 2021-12-14 MED ORDER — LABETALOL HCL 300 MG PO TABS
300.0000 mg | ORAL_TABLET | Freq: Two times a day (BID) | ORAL | 11 refills | Status: DC
  Filled 2021-12-14: qty 60, 30d supply, fill #0

## 2021-12-14 NOTE — Patient Instructions (Signed)
[name], it was a pleasure seeing you today! ? ?Today we discussed: ?Blood pressure ?I have refilled amlodipine, labetolol, and clonidine. Please continue taking as you have been. When you are able please get a blood pressure cuff to check blood pressure at home daily. I sent medications to outpatient pharmacy at [facility], it should be around $12 for month supply. Once insurance is ok, then let me know best pharmacy for you. I am referring you to an eye doctor. ? ?Follow-up with Kidney doctors ?Appointment with Dr. Candiss Randolph at [facility] on 12/24/21 at 8:20 AM. ? ?I have ordered the following labs today: ? ? ?Lab Orders    ?     BMP8+Anion Gap    ?     CBC no Diff     ? ?I will call if any are abnormal. All of your labs can be accessed through "My Chart" ?  ?My Chart Access: ?[email or web address] ? ? ?Referrals ordered today:  ? ? ?Referral Orders    ?     Ambulatory referral to Ophthalmology     ? ?I have ordered the following medication/changed the following medications:  ? ?Stop the following medications: ?Medications Discontinued During This Encounter  ?Medication Reason  ? amLODipine (NORVASC) 10 MG tablet Reorder  ? cloNIDine (CATAPRES) 0.1 MG tablet Reorder  ? labetalol (NORMODYNE) 300 MG tablet Reorder  ?  ? ?Start the following medications: ?Meds ordered this encounter  ?Medications  ? cloNIDine (CATAPRES) 0.1 MG tablet  ?  Sig: Take 1 tablet (0.1 mg total) by mouth 2 (two) times daily.  ?  Dispense:  60 tablet  ?  Refill:  11  ?  IM program  ? amLODipine (NORVASC) 10 MG tablet  ?  Sig: Take 1 tablet (10 mg total) by mouth daily.  ?  Dispense:  30 tablet  ?  Refill:  4  ?  IM program  ? labetalol (NORMODYNE) 300 MG tablet  ?  Sig: Take 1 tablet (300 mg total) by mouth 2 (two) times daily.  ?  Dispense:  60 tablet  ?  Refill:  11  ?  IM program  ?  ? ?Follow-up:  1 month   ? ?Please make sure to arrive 15 minutes prior to your next  appointment. If you arrive late, you may be asked to reschedule.  ? ?We look forward to seeing you next time. Please call our clinic at [phone] if you have any questions or concerns. The best time to call is Monday-Friday from 9am-4pm, but there is someone available 24/7. If after hours or the weekend, call the main hospital number and ask for the Internal Medicine Resident On-Call. If you need medication refills, please notify your pharmacy one week in advance and they will send [place] a request. ? ?Thank you for letting us take part in your care. Wishing you the best! ? ?Thank you, ?Dr. Howie Ill ?[place]  ?

## 2021-12-14 NOTE — Assessment & Plan Note (Addendum)
Patient who is Roy Randolph witness with end stage renal disease (plans to initiate dialysis in near future) presents with normocytic anemia. Hgb at time of discharge at 7.8, iron 27. He is following up with nephrology 3/27. His anemia is likely due to chronic kidney disease. He would benefit from iron infusions as well as possibly EPO with HD.  ?-follow-up with nephrology ?-recheck CBC ?-records requested from [place] visit 2/23 ?

## 2021-12-14 NOTE — Assessment & Plan Note (Signed)
Takes protonix with well-controlled symptoms ?

## 2021-12-14 NOTE — Telephone Encounter (Signed)
Called patient and he states that his sleep study request was denied because there is no indication for it. Can we try to figure out what needs to be done for this patient moving forward. ? ?Thank you  ?

## 2021-12-14 NOTE — Assessment & Plan Note (Addendum)
HFpEF EF 55% 1/23 ?Blood pressure at 142/97. Does not have a blood pressure cuff at home.  ?Medications: amlodipine 10, clonidine 0.1 BID, labetolol 300 mg BID ?Patient has has 2 Admissions with ICU level care in 2023. He lost vision and presented with a severe headache for most recent admission.  Was found to have PRES. MRI brain showed microhemorrhage within both occipital lobes and cerebellar hemispheres. He states that his vision has improved, but he continues to have "blotches" in several different areas of visual field.  ? ?He discontinued HTN regimen following hospital stay in 01/23 because he was feeling oversedated by medications. Since being discharged this time, he has been taking clonidine, labetalol, and amlodipine as prescribed. He states that medication still makes him tired and he did not take labetalol or clonidine this AM because he knew that he would be driving himself. Denies headache, chest pain, or shortness of breath. ? ?BP mildly elevated at 142/97 in clinic. Lungs clear to auscultation, heart sounds regular rate and rhythm, no murmurs appreciated. ?P: ?Refilled amlodipine, clonidine, and labetalol. Patient will be starting dialysis at some point soon and it is likely blood pressure will regulate with HD.  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

## 2021-12-14 NOTE — Addendum Note (Signed)
Addended by: Edwyna Perfect on: 12/14/2021 02:49 PM ? ? Modules accepted: Orders ? ?

## 2021-12-14 NOTE — Assessment & Plan Note (Signed)
He states that he stopped smoking in January because he was told it was making blood pressure worse. He had severe headache following stopping smoking, but states that he has gotten through the symptoms and does not have headache or many cravings now. ?

## 2021-12-14 NOTE — Telephone Encounter (Signed)
Routing encounter to Dr. Erin Fulling for him to review. Please advise on this. ?

## 2021-12-14 NOTE — Assessment & Plan Note (Addendum)
Patient with history of uncontrolled blood pressure. Following admissions this year for malignant hypertension, creatinine function worsened from stage 4 to end-stage. Creatinine at 7.61, with GFR 8 on discharge. He is scheduled for follow-up with nephrology 3/27 with Dr. Candiss Norse. He is aware that he well need HD in the near future. He states that he still makes urine. ?A/P: ?F/u with nephrology ?Recheck BMP ?-records requested from [place] visit 2/23 ?Patient states that he has good social support in the area with his sister. He is working on getting transportation through his insurance to help him get to HD appointments. ?

## 2021-12-14 NOTE — Progress Notes (Addendum)
? ? ? ? ?Subjective:  ?CC: New patient, hospital follow-up PRES 2/2 Malignant hypertension ? ?HPI: ? ?[name] is a 46 y.o. male with a past medical history stated below and presents today for hospital follow-up after second admission this year due to malignant hypertesion. Please see problem based assessment and plan for additional details. ? ?Past Medical History:  ?Diagnosis Date  ? Abnormal EKG 10/10/2021  ? Acute renal failure superimposed on stage 4 chronic kidney disease ([place]) 10/10/2021  ? Alcohol use   ? Alcohol use disorder in remission 11/14/2021  ? Elevated troponin 10/10/2021  ? Epigastric pain 10/10/2021  ? H/O alcohol abuse 10/10/2021  ? History of migraine 11/14/2021  ? Hypertension   ? Hypertensive emergency due to non compliance 10/10/2021  ? Hypokalemia 10/10/2021  ? Left arm numbness and pain 10/10/2021  ? Marijuana use 10/10/2021  ? Noncompliance   ? NSAID long-term use 10/10/2021  ? PRES (posterior reversible encephalopathy syndrome)   ? Smoking   ? ?Medications: ?Clonidine 0.1 mg BID ?Amlodipine 10 mg qd ?Labetalol 300 mg BID ?Multi vitamin ? ?Family History  ?Problem Relation Age of Onset  ? Hypertension Mother   ? Cervical cancer Mother   ? Breast cancer Sister   ? Sudden Cardiac Death Brother 19  ? Heart disease Maternal Grandmother   ? Cancer Maternal Grandmother   ?-Vascular dementia ? ?Social history: patient moved from [place] in 2021, moved to [place] recently. His sister lives in the area. 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 He stopped smoking in January. He used to drink alcohol, but stopped 2 years ago. He occasionally smokes marijuana. No other illicit drug use. ? ?Review of Systems: ?ROS negative except for what is noted on the assessment and plan. ? ?Objective:  ? ?Vitals:  ? 12/14/21 0944  ?BP: (!) 142/97  ?Pulse: 77  ?Temp: 98.2 ?F (36.8 ?C)  ?TempSrc: Oral  ?SpO2: 100%  ?Weight: 161 lb 11.2 oz  (73.3 kg)  ?Height: 5\' 9"  (1.753 m)  ? ? ?Physical Exam: ?Gen: A&O x3 and in no apparent distress, well appearing and nourished. ?HEENT:  ?  Head - normocephalic, atraumatic.  ?  Eye - visual acuity grossly intact, conjunctiva clear, sclera non-icteric, EOM intact.  ?  Mouth - No obvious caries or periodontal disease. ?Neck: no masses or nodules, AROM intact. ?CV: RRR, no murmurs, S1/S2 presents  ?Resp: Clear to ascultation bilaterally  ?Abd: BS (+) x4, soft, non-tender abdomen, without hepatosplenomegaly or masses ?MSK: Grossly normal AROM and strength x4 extremities. ?Skin: good skin turgor, no rashes, unusual bruising, or prominent lesions.  ?Neuro: No focal deficits, grossly normal sensation and coordination.  ?Psych: Oriented x3 and responding appropriately. Intact memory, normal mood, judgement, affect, and insight.  ? ? ?Assessment & Plan:  ?Essential hypertension ?HFpEF EF 55% 1/23 ?Blood pressure at 142/97. Does not have a blood pressure cuff at home.  ?Medications: amlodipine 10, clonidine 0.1 BID, labetolol 300 mg BID ?Patient has has 2 Admissions with ICU level care in 2023. He lost vision and presented with a severe headache for most recent admission.  Was found to have PRES. MRI brain showed microhemorrhage within both occipital lobes and cerebellar hemispheres. He states that his vision has improved, but he continues to have "blotches" in several different areas of visual field.  ? ?He discontinued HTN regimen following hospital stay in 01/23 because he was feeling oversedated  by medications. Since being discharged this time, he has been taking clonidine, labetalol, and amlodipine as prescribed. He states that medication still makes him tired and he did not take labetalol or clonidine this AM because he knew that he would be driving himself. Denies headache, chest pain, or shortness of breath. ? ?BP mildly elevated at 142/97 in clinic. Lungs clear to auscultation, heart sounds regular rate and rhythm,  no murmurs appreciated. ?P: ?Refilled amlodipine, clonidine, and labetalol. Patient will be starting dialysis at some point soon and it is likely blood pressure will regulate with HD.  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 ?End stage kidney disease ([place]) ?Patient with history of uncontrolled blood pressure. Following admissions this year for malignant hypertension, creatinine function worsened from stage 4 to end-stage. Creatinine at 7.61, with GFR 8 on discharge. He is scheduled for follow-up with nephrology 3/27 with Dr. Candiss Norse. He is aware that he well need HD in the near future. He states that he still makes urine. ?A/P: ?F/u with nephrology ?Recheck BMP ?-records requested from [place] visit 2/23 ?Patient states that he has good social support in the area with his sister. He is working on getting transportation through his insurance to help him get to HD appointments. ? ?Former smoker ?He states that he stopped smoking in January because he was told it was making blood pressure worse. He had severe headache following stopping smoking, but states that he has gotten through the symptoms and does not have headache or many cravings now. ? ?Gastritis without bleeding ?Takes protonix with well-controlled symptoms ? ?Normocytic anemia ?Patient who is Roy Randolph witness with end stage renal disease (plans to initiate dialysis in near future) presents with normocytic anemia. Hgb at time of discharge at 7.8, iron 27. He is following up with nephrology 3/27. His anemia is likely due to chronic kidney disease. He would benefit from iron infusions as well as possibly EPO with HD.  ?-follow-up with nephrology ?-recheck CBC ?-records requested from [place] visit 2/23  ? ?Patient discussed with Dr. Dareen Piano ? ? ?Christiana Fuchs, D.O. ?[facility]  PGY-1 ?Pager: [phone]  Phone: [phone] ?Date 12/14/2021  Time 2:36 PM  ? ? ? ?

## 2021-12-14 NOTE — Assessment & Plan Note (Signed)
Gad elevated at 12.  He states that he feels like he has a lot going on with his health. He is open to talking with someone about this. He does feel that he has good social support with his sister. He states that he almost didn't make it into the building to appointment because he was nervous about what we would talk about. Patient states that he would be open to taking medication, but would like to hold off for now cause of how many he is taking for blood pressure. ?P: ?Referral to Dr. Theodis Shove ?

## 2021-12-14 NOTE — Telephone Encounter (Signed)
Unfortunately, Dr. Erin Fulling will need to give more insight for the sleep study as the patient has never been seen in our office.  We provided the order & hospitalization documentation to the insurance company.  ?

## 2021-12-15 LAB — BMP8+ANION GAP
Anion Gap: 19 mmol/L — ABNORMAL HIGH (ref 10.0–18.0)
BUN/Creatinine Ratio: 11 (ref 9–20)
BUN: 95 mg/dL (ref 6–24)
CO2: 19 mmol/L — ABNORMAL LOW (ref 20–29)
Calcium: 9.1 mg/dL (ref 8.7–10.2)
Chloride: 97 mmol/L (ref 96–106)
Creatinine, Ser: 8.82 mg/dL — ABNORMAL HIGH (ref 0.76–1.27)
Glucose: 109 mg/dL — ABNORMAL HIGH (ref 70–99)
Potassium: 4.2 mmol/L (ref 3.5–5.2)
Sodium: 135 mmol/L (ref 134–144)
eGFR: 7 mL/min/{1.73_m2} — ABNORMAL LOW (ref >59)

## 2021-12-15 LAB — CBC
Hematocrit: 25 % — ABNORMAL LOW (ref 37.5–51.0)
Hemoglobin: 7.9 g/dL — ABNORMAL LOW (ref 13.0–17.7)
MCH: 25.8 pg — ABNORMAL LOW (ref 26.6–33.0)
MCHC: 31.6 g/dL (ref 31.5–35.7)
MCV: 82 fL (ref 79–97)
Platelets: 214 10*3/uL (ref 150–450)
RBC: 3.06 x10E6/uL — ABNORMAL LOW (ref 4.14–5.80)
RDW: 17.4 % — ABNORMAL HIGH (ref 11.6–15.4)
WBC: 7.6 10*3/uL (ref 3.4–10.8)

## 2021-12-17 NOTE — Progress Notes (Signed)
Internal Medicine Clinic Attending  Case discussed with Dr. Masters  At the time of the visit.  We reviewed the resident's history and exam and pertinent patient test results.  I agree with the assessment, diagnosis, and plan of care documented in the resident's note.  

## 2021-12-18 NOTE — Telephone Encounter (Signed)
Patient was admitted for resistant hypertension leading to hypertensive emergency as listed in my phone note when home sleep study was ordered. If he needs to be seen by a sleep specialist first, then please arrange this visit if not done yet.  ? ?Thanks, ?JD ?

## 2021-12-18 NOTE — Telephone Encounter (Signed)
PCCs, see phone note from 12/03/21 from Dr. Erin Fulling. Please advise if this is sufficient. If not then will have to get him in for sleep consult per Dr. Erin Fulling. Thanks.  ?

## 2021-12-20 ENCOUNTER — Ambulatory Visit (INDEPENDENT_AMBULATORY_CARE_PROVIDER_SITE_OTHER): Payer: Self-pay | Admitting: Internal Medicine

## 2021-12-20 ENCOUNTER — Other Ambulatory Visit (HOSPITAL_COMMUNITY): Payer: Self-pay

## 2021-12-20 VITALS — BP 151/97 | HR 70 | Temp 98.4°F | Ht 69.0 in | Wt 161.0 lb

## 2021-12-20 DIAGNOSIS — I120 Hypertensive chronic kidney disease with stage 5 chronic kidney disease or end stage renal disease: Secondary | ICD-10-CM

## 2021-12-20 DIAGNOSIS — K117 Disturbances of salivary secretion: Secondary | ICD-10-CM

## 2021-12-20 DIAGNOSIS — I10 Essential (primary) hypertension: Secondary | ICD-10-CM

## 2021-12-20 DIAGNOSIS — N186 End stage renal disease: Secondary | ICD-10-CM

## 2021-12-20 DIAGNOSIS — R682 Dry mouth, unspecified: Secondary | ICD-10-CM

## 2021-12-20 DIAGNOSIS — F419 Anxiety disorder, unspecified: Secondary | ICD-10-CM

## 2021-12-20 DIAGNOSIS — Z992 Dependence on renal dialysis: Secondary | ICD-10-CM

## 2021-12-20 MED ORDER — SERTRALINE HCL 25 MG PO TABS
25.0000 mg | ORAL_TABLET | Freq: Every day | ORAL | 2 refills | Status: DC
  Filled 2021-12-20: qty 30, 30d supply, fill #0

## 2021-12-20 MED ORDER — SALINE SPRAY 0.65 % NA SOLN
1.0000 | NASAL | 0 refills | Status: DC | PRN
  Filled 2021-12-20: qty 15, fill #0

## 2021-12-20 MED ORDER — CLONIDINE HCL 0.2 MG PO TABS
0.2000 mg | ORAL_TABLET | Freq: Two times a day (BID) | ORAL | 11 refills | Status: DC
Start: 2021-12-20 — End: 2022-01-02
  Filled 2021-12-20: qty 60, 30d supply, fill #0

## 2021-12-20 NOTE — Patient Instructions (Signed)
Roy Randolph, it was a pleasure seeing you today! ? ?Today we discussed: ?Blood pressure ?Your blood pressure was high today. Please increase clonidine dose to 0.2 mg 1 time in morning and 1 time in evening. Continue taking labetolol and amlodipine as you have been. I sent in a prescription to [facility] for clonidine 0.2 mg. Follow-up in 4 weeks. ? ?Anxiety/ sleep ?I am sending in a medication called sertraline. Take this medication daily. It will take some time for it to work. Please come back in 4 weeks. ? ?Dry mouth ?Please continue using mouth wash as you have been. You can also try nasal spray before sleeping to see if that helps. ? ?Dialysis ?Please follow-up with kidney doctor next week. I think that it would be best to stay in this area to follow-up on current appointments as flying back to [place] could delay your care. ? ? ?I have ordered the following labs today: ? ?Lab Orders  ?No laboratory test(s) ordered today  ?  ? ?I will call if any are abnormal. All of your labs can be accessed through "My Chart" ?  ?My Chart Access: ?[email or web address] ? ? ?Referrals ordered today:  ? ?Referral Orders  ?No referral(s) requested today  ?  ? ?I have ordered the following medication/changed the following medications:  ? ?Stop the following medications: ?There are no discontinued medications.  ? ?Start the following medications: ?No orders of the defined types were placed in this encounter. ?  ? ?Follow-up:  4 weeks   ? ?Please make sure to arrive 15 minutes prior to your next appointment. If you arrive late, you may be asked to reschedule.  ? ?We look forward to seeing you next time. Please call our clinic at [phone] if you have any questions or concerns. The best time to call is Monday-Friday from 9am-4pm, but there is someone available 24/7. If after hours or the weekend, call the main hospital number and ask for the Internal Medicine  Resident On-Call. If you need medication refills, please notify your pharmacy one week in advance and they will send [place] a request. ? ?Thank you for letting us take part in your care. Wishing you the best! ? ?Thank you, ?Dr. Howie Ill ?[place]  ?

## 2021-12-20 NOTE — Progress Notes (Signed)
? ? ?Subjective:  ?CC: dry mouth, anxiety ? ?HPI: ? ?Roy Randolph is a 46 y.o. male with a past medical history of stage V renal disease not on dialysis, hypertension, and anxiety who presents today for xerostomia and anxiety. Please see problem based assessment and plan for additional details. ? ?Past Medical History:  ?Diagnosis Date  ? Abnormal EKG 10/10/2021  ? Acute renal failure superimposed on stage 4 chronic kidney disease ([place]) 10/10/2021  ? Alcohol use   ? Alcohol use disorder in remission 11/14/2021  ? Elevated troponin 10/10/2021  ? Epigastric pain 10/10/2021  ? H/O alcohol abuse 10/10/2021  ? History of migraine 11/14/2021  ? Hypertension   ? Hypertensive emergency due to non compliance 10/10/2021  ? Hypokalemia 10/10/2021  ? Left arm numbness and pain 10/10/2021  ? Marijuana use 10/10/2021  ? Noncompliance   ? NSAID long-term use 10/10/2021  ? PRES (posterior reversible encephalopathy syndrome)   ? Smoker 10/10/2021  ? Smoking   ? Syncope, vasovagal 10/10/2021  ? Thrombocytopenia ([place]) 10/10/2021  ? Vision disturbance 11/14/2021  ? Vomiting 10/10/2021  ? ? ?Current Outpatient Medications on File Prior to Visit  ?Medication Sig Dispense Refill  ? acetaminophen (TYLENOL) 325 MG tablet Take 325-650 mg by mouth every 6 (six) hours as needed for mild pain or headache.    ? amLODipine (NORVASC) 10 MG tablet Take 1 tablet (10 mg total) by mouth daily. 30 tablet 4  ? Ascorbic Acid (VITAMIN C PO) Take 1 tablet by mouth daily.    ? labetalol (NORMODYNE) 300 MG tablet Take 1 tablet (300 mg total) by mouth 2 (two) times daily. 60 tablet 11  ? Multiple Vitamins-Minerals (CERTAVITE/ANTIOXIDANTS) TABS Take 1 tablet by mouth at bedtime. 30 tablet 3  ? pantoprazole (PROTONIX) 40 MG tablet Take 1 tablet (40 mg total) by mouth daily. 30 tablet 4  ? ?No current facility-administered medications on file prior to visit.  ? ? ?Family History  ?Problem Relation Age of Onset  ? Hypertension Mother   ? Cervical cancer Mother   ?  Breast cancer Sister   ? Sudden Cardiac Death Brother 80  ? Heart disease Maternal Grandmother   ? Cancer Maternal Grandmother   ? ? ?Social History  ? ?Socioeconomic History  ? Marital status: Single  ?  Spouse name: Not on file  ? Number of children: Not on file  ? Years of education: Not on file  ? Highest education level: Not on file  ?Occupational History  ? Not on file  ?Tobacco Use  ? Smoking status: Every Day  ?  Packs/day: 0.30  ?  Types: Cigarettes  ?  Start date: 77  ? Smokeless tobacco: Not on file  ?Substance and Sexual Activity  ? Alcohol use: Not Currently  ?  Alcohol/week: 12.0 standard drinks  ?  Types: 12 Cans of beer per week  ?  Comment: stopped heavy drinking 2021  ? Drug use: Yes  ?  Types: Marijuana  ? Sexual activity: Not on file  ?Other Topics Concern  ? Not on file  ?Social History Narrative  ? Not on file  ? ?Social Determinants of Health  ? ?Financial Resource Strain: Not on file  ?Food Insecurity: Not on file  ?Transportation Needs: Not on file  ?Physical Activity: Not on file  ?Stress: Not on file  ?Social Connections: Not on file  ?Intimate Partner Violence: Not on file  ? ? ?Review of Systems: ?ROS negative except for what is noted  on the assessment and plan. ? ?Objective:  ? ?Vitals:  ? 12/20/21 1405  ?BP: (!) 151/97  ?Pulse: 70  ?Temp: 98.4 ?F (36.9 ?C)  ?TempSrc: Oral  ?SpO2: 100%  ?Weight: 161 lb (73 kg)  ?Height: 5\' 9"  (1.753 m)  ? ? ?Physical Exam: ?Gen: A&O x3 and in no apparent distress, well appearing and nourished. ?HEENT:  ?  Eye - visual acuity grossly intact, conjunctiva clear, sclera non-icteric, EOM intact.  ?  Mouth - mucous membranes appear dry, poor dentition ?Neck: no masses or nodules, AROM intact. ?CV: RRR, no murmurs, S1/S2 presents  ?Resp: Clear to auscultation bilaterally  ?Abd: BS (+) x4, soft, non-tender abdomen, without hepatosplenomegaly or masses ?MSK: Grossly normal AROM and strength x4 extremities. ?Skin: good skin turgor, no rashes, unusual bruising,  or prominent lesions.  ?Neuro: No focal deficits, grossly normal sensation and coordination.  ? ? ?Assessment & Plan:  ?Xerostomia ?Patient with history of stage V Renal disease with follow-up to start dialysis soon presents with dry mouth. He states that when he wakes up in the morning he feels "mummified" from how dry his mouth is. His taste has changed and he feels like everything tastes like ammonia.  When he wakes up he has to pull out chunks of dried saliva from his mouth. He brushes his teeth and uses mouthwash to help his mouth feel better. He has not noticed symptoms as much during day. ?A: ?Symptoms are likely 2/2 to ESRD. He is taking clonidine for blood pressure control which could cause xerostomia, but he needs to be on this medication as he has uncontrolled HTN leading to ESRD and has been in ICU 2 times this year due to malignant hypertension with vision changes. ?P: ?Continue using mouthwash to alleviate dry mouth ?Talked with patient about trying ocean spray before bed to see if this helps. ? ?Essential hypertension ?BP Readings from Last 3 Encounters:  ?12/20/21 (!) 151/97  ?12/14/21 (!) 142/97  ?12/07/21 133/87  ? ?Patient with history of malignant hypertension presents for follow-up. He states that he takes blood pressure medications as prescribed. Today he took clonidine and labetalol prior to visit (last week he waited until he got home because they make him sleepy). Blood pressure is elevated at 151/97. Patient states that his vision has continued to improve. He still has some blotches in certain areas of his vision.  ?P: ?Increase Clonidine from 0.1 to 0.2 BID ?Appointment with nephrology 3/27 ? ?End stage kidney disease ([place]) ?Patient presents with stage V chronic renal disease 2/2 to uncontrolled hypertension. He will follow-up with nephrology on 3/27 to talk about next steps with dialysis. He is concerned that his care will be delayed because insurance is only valid in [place]. He is  considering flying back to [place] to establish care for dialysis. Encouraged patient to follow-up with nephrology appointment in 4 days in [place] to talk about next steps with dialysis.  ?A: ?BMP last week with potassium within normal limits. His BUN has been trending up since discharge from hospital as expected. He does not need emergent dialysis at this time, but needs to have close follow-up with nephrology.  ?P: ?Nephrology follow-up 3/27 ? ?Anxiety ?Patient presents with anxiety. GAD increased from 12 to 19 in clinic today. He states that he is having difficulty sleeping. He frequently wakes up and sees people who he knows are not there standing over him. He is also having frequent mood swings. Sometimes he sits down to eat a  meal and starts to cry for no reason. He is worried that insurance is delaying his dialysis starting. He has considered flying back to [place], but feels that he has more social support here with his sister.  ?P: ?Talked with patient about starting SSRI. He is open to this. Encouraged patient to stay in [facility] to follow-up with nephrology next week to talk about next steps with dialysis. ?-start sertraline 25 mg qd  ? ?Patient discussed with Dr. Daryll Drown ? ? ?Christiana Fuchs, D.O. ?[facility]  PGY-1 ?Pager: [phone]  Phone: [phone] ?Date 12/21/2021  Time 9:55 AM  ?

## 2021-12-21 ENCOUNTER — Other Ambulatory Visit (HOSPITAL_COMMUNITY): Payer: Self-pay

## 2021-12-21 DIAGNOSIS — K117 Disturbances of salivary secretion: Secondary | ICD-10-CM | POA: Insufficient documentation

## 2021-12-21 NOTE — Assessment & Plan Note (Signed)
Patient presents with anxiety. GAD increased from 12 to 19 in clinic today. He states that he is having difficulty sleeping. He frequently wakes up and sees people who he knows are not there standing over him. He is also having frequent mood swings. Sometimes he sits down to eat a meal and starts to cry for no reason. He is worried that insurance is delaying his dialysis starting. He has considered flying back to [place], but feels that he has more social support here with his sister.  ?P: ?Talked with patient about starting SSRI. He is open to this. Encouraged patient to stay in [facility] to follow-up with nephrology next week to talk about next steps with dialysis. ?-start sertraline 25 mg qd ?

## 2021-12-21 NOTE — Assessment & Plan Note (Signed)
Patient with history of stage V Renal disease with follow-up to start dialysis soon presents with dry mouth. He states that when he wakes up in the morning he feels "mummified" from how dry his mouth is. His taste has changed and he feels like everything tastes like ammonia.  When he wakes up he has to pull out chunks of dried saliva from his mouth. He brushes his teeth and uses mouthwash to help his mouth feel better. He has not noticed symptoms as much during day. ?A: ?Symptoms are likely 2/2 to ESRD. He is taking clonidine for blood pressure control which could cause xerostomia, but he needs to be on this medication as he has uncontrolled HTN leading to ESRD and has been in ICU 2 times this year due to malignant hypertension with vision changes. ?P: ?Continue using mouthwash to alleviate dry mouth ?Talked with patient about trying ocean spray before bed to see if this helps. ?

## 2021-12-21 NOTE — Assessment & Plan Note (Addendum)
Patient presents with stage V chronic renal disease 2/2 to uncontrolled hypertension. He will follow-up with nephrology on 3/27 to talk about next steps with dialysis. He is concerned that his care will be delayed because insurance is only valid in [place]. He is considering flying back to [place] to establish care for dialysis. Encouraged patient to follow-up with nephrology appointment in 4 days in [place] to talk about next steps with dialysis.  ?A: ?BMP last week with potassium within normal limits. His BUN has been trending up since discharge from hospital as expected. He does not need emergent dialysis at this time, but needs to have close follow-up with nephrology.  ?P: ?Nephrology follow-up 3/27 ?

## 2021-12-21 NOTE — Assessment & Plan Note (Signed)
BP Readings from Last 3 Encounters:  ?12/20/21 (!) 151/97  ?12/14/21 (!) 142/97  ?12/07/21 133/87  ? ?Patient with history of malignant hypertension presents for follow-up. He states that he takes blood pressure medications as prescribed. Today he took clonidine and labetalol prior to visit (last week he waited until he got home because they make him sleepy). Blood pressure is elevated at 151/97. Patient states that his vision has continued to improve. He still has some blotches in certain areas of his vision.  ?P: ?Increase Clonidine from 0.1 to 0.2 BID ?Appointment with nephrology 3/27 ?

## 2021-12-24 ENCOUNTER — Encounter (HOSPITAL_COMMUNITY): Payer: Self-pay

## 2021-12-24 ENCOUNTER — Emergency Department (HOSPITAL_COMMUNITY): Payer: Medicaid Other

## 2021-12-24 ENCOUNTER — Other Ambulatory Visit: Payer: Self-pay

## 2021-12-24 ENCOUNTER — Inpatient Hospital Stay (HOSPITAL_COMMUNITY)
Admission: EM | Admit: 2021-12-24 | Discharge: 2022-01-02 | DRG: 628 | Disposition: A | Discharge status: Home / Self Care | Payer: Medicaid Other | Attending: Internal Medicine | Admitting: Internal Medicine

## 2021-12-24 DIAGNOSIS — I120 Hypertensive chronic kidney disease with stage 5 chronic kidney disease or end stage renal disease: Secondary | ICD-10-CM | POA: Diagnosis present

## 2021-12-24 DIAGNOSIS — D509 Iron deficiency anemia, unspecified: Secondary | ICD-10-CM | POA: Diagnosis present

## 2021-12-24 DIAGNOSIS — K920 Hematemesis: Secondary | ICD-10-CM | POA: Diagnosis not present

## 2021-12-24 DIAGNOSIS — E872 Acidosis, unspecified: Secondary | ICD-10-CM | POA: Diagnosis present

## 2021-12-24 DIAGNOSIS — N179 Acute kidney failure, unspecified: Secondary | ICD-10-CM | POA: Diagnosis present

## 2021-12-24 DIAGNOSIS — Z992 Dependence on renal dialysis: Secondary | ICD-10-CM | POA: Diagnosis not present

## 2021-12-24 DIAGNOSIS — K068 Other specified disorders of gingiva and edentulous alveolar ridge: Secondary | ICD-10-CM | POA: Diagnosis present

## 2021-12-24 DIAGNOSIS — R042 Hemoptysis: Secondary | ICD-10-CM | POA: Diagnosis present

## 2021-12-24 DIAGNOSIS — I10 Essential (primary) hypertension: Secondary | ICD-10-CM | POA: Diagnosis present

## 2021-12-24 DIAGNOSIS — Z8249 Family history of ischemic heart disease and other diseases of the circulatory system: Secondary | ICD-10-CM | POA: Diagnosis not present

## 2021-12-24 DIAGNOSIS — N19 Unspecified kidney failure: Secondary | ICD-10-CM | POA: Diagnosis present

## 2021-12-24 DIAGNOSIS — Z79899 Other long term (current) drug therapy: Secondary | ICD-10-CM

## 2021-12-24 DIAGNOSIS — D696 Thrombocytopenia, unspecified: Secondary | ICD-10-CM | POA: Diagnosis present

## 2021-12-24 DIAGNOSIS — E871 Hypo-osmolality and hyponatremia: Secondary | ICD-10-CM | POA: Diagnosis present

## 2021-12-24 DIAGNOSIS — Z91148 Patient's other noncompliance with medication regimen for other reason: Secondary | ICD-10-CM | POA: Diagnosis present

## 2021-12-24 DIAGNOSIS — D649 Anemia, unspecified: Secondary | ICD-10-CM | POA: Diagnosis not present

## 2021-12-24 DIAGNOSIS — R634 Abnormal weight loss: Secondary | ICD-10-CM | POA: Diagnosis present

## 2021-12-24 DIAGNOSIS — E8809 Other disorders of plasma-protein metabolism, not elsewhere classified: Secondary | ICD-10-CM | POA: Diagnosis present

## 2021-12-24 DIAGNOSIS — D631 Anemia in chronic kidney disease: Secondary | ICD-10-CM | POA: Diagnosis present

## 2021-12-24 DIAGNOSIS — E8770 Fluid overload, unspecified: Principal | ICD-10-CM | POA: Diagnosis present

## 2021-12-24 DIAGNOSIS — Z87891 Personal history of nicotine dependence: Secondary | ICD-10-CM

## 2021-12-24 DIAGNOSIS — F410 Panic disorder [episodic paroxysmal anxiety] without agoraphobia: Secondary | ICD-10-CM | POA: Diagnosis present

## 2021-12-24 DIAGNOSIS — G4733 Obstructive sleep apnea (adult) (pediatric): Secondary | ICD-10-CM | POA: Diagnosis present

## 2021-12-24 DIAGNOSIS — Z91018 Allergy to other foods: Secondary | ICD-10-CM

## 2021-12-24 DIAGNOSIS — N186 End stage renal disease: Secondary | ICD-10-CM

## 2021-12-24 DIAGNOSIS — Z888 Allergy status to other drugs, medicaments and biological substances status: Secondary | ICD-10-CM | POA: Diagnosis not present

## 2021-12-24 DIAGNOSIS — Z20822 Contact with and (suspected) exposure to covid-19: Secondary | ICD-10-CM | POA: Diagnosis present

## 2021-12-24 DIAGNOSIS — J9601 Acute respiratory failure with hypoxia: Secondary | ICD-10-CM | POA: Diagnosis present

## 2021-12-24 HISTORY — PX: IR FLUORO GUIDE CV LINE RIGHT: IMG2283

## 2021-12-24 HISTORY — PX: IR US GUIDE VASC ACCESS RIGHT: IMG2390

## 2021-12-24 LAB — COMPREHENSIVE METABOLIC PANEL
ALT: 13 U/L (ref 0–44)
AST: 21 U/L (ref 15–41)
Albumin: 2.9 g/dL — ABNORMAL LOW (ref 3.5–5.0)
Alkaline Phosphatase: 72 U/L (ref 38–126)
Anion gap: 17 — ABNORMAL HIGH (ref 5–15)
BUN: 143 mg/dL — ABNORMAL HIGH (ref 6–20)
CO2: 14 mmol/L — ABNORMAL LOW (ref 22–32)
Calcium: 8.8 mg/dL — ABNORMAL LOW (ref 8.9–10.3)
Chloride: 99 mmol/L (ref 98–111)
Creatinine, Ser: 20.12 mg/dL — ABNORMAL HIGH (ref 0.61–1.24)
GFR, Estimated: 3 mL/min — ABNORMAL LOW (ref >60)
Glucose, Bld: 127 mg/dL — ABNORMAL HIGH (ref 70–99)
Potassium: 4.5 mmol/L (ref 3.5–5.1)
Sodium: 130 mmol/L — ABNORMAL LOW (ref 135–145)
Total Bilirubin: 0.9 mg/dL (ref 0.3–1.2)
Total Protein: 7.4 g/dL (ref 6.5–8.1)

## 2021-12-24 LAB — RESP PANEL BY RT-PCR (FLU A&B, COVID) ARPGX2
Influenza A by PCR: NEGATIVE
Influenza B by PCR: NEGATIVE
SARS Coronavirus 2 by RT PCR: NEGATIVE

## 2021-12-24 LAB — RESPIRATORY PANEL BY PCR

## 2021-12-24 LAB — CBC WITH DIFFERENTIAL/PLATELET
Abs Immature Granulocytes: 0.06 10*3/uL (ref 0.00–0.07)
Basophils Absolute: 0 10*3/uL (ref 0.0–0.1)
Basophils Relative: 0 %
Eosinophils Absolute: 0 10*3/uL (ref 0.0–0.5)
Eosinophils Relative: 0 %
HCT: 19.4 % — ABNORMAL LOW (ref 39.0–52.0)
Hemoglobin: 6.2 g/dL — CL (ref 13.0–17.0)
Immature Granulocytes: 1 %
Lymphocytes Relative: 7 %
Lymphs Abs: 0.5 10*3/uL — ABNORMAL LOW (ref 0.7–4.0)
MCH: 25.4 pg — ABNORMAL LOW (ref 26.0–34.0)
MCHC: 32 g/dL (ref 30.0–36.0)
MCV: 79.5 fL — ABNORMAL LOW (ref 80.0–100.0)
Monocytes Absolute: 0.5 10*3/uL (ref 0.1–1.0)
Monocytes Relative: 7 %
Neutro Abs: 5.6 10*3/uL (ref 1.7–7.7)
Neutrophils Relative %: 85 %
Platelets: 80 10*3/uL — ABNORMAL LOW (ref 150–400)
RBC: 2.44 MIL/uL — ABNORMAL LOW (ref 4.22–5.81)
RDW: 17.7 % — ABNORMAL HIGH (ref 11.5–15.5)
WBC: 6.6 10*3/uL (ref 4.0–10.5)
nRBC: 0 % (ref 0.0–0.2)

## 2021-12-24 LAB — HEPATITIS B SURFACE ANTIGEN: Hepatitis B Surface Ag: NONREACTIVE

## 2021-12-24 LAB — PREPARE RBC (CROSSMATCH)

## 2021-12-24 LAB — LACTIC ACID, PLASMA: Lactic Acid, Venous: 0.6 mmol/L (ref 0.5–1.9)

## 2021-12-24 LAB — HEPATITIS B SURFACE ANTIBODY,QUALITATIVE: Hep B S Ab: NONREACTIVE

## 2021-12-24 LAB — ABO/RH: ABO/RH(D): O POS

## 2021-12-24 LAB — PROCALCITONIN: Procalcitonin: 1.12 ng/mL

## 2021-12-24 MED ORDER — HEPARIN SODIUM (PORCINE) 1000 UNIT/ML IJ SOLN
INTRAMUSCULAR | Status: AC
  Administered 2021-12-24: 2.6 mL
  Filled 2021-12-24: qty 10

## 2021-12-24 MED ORDER — LABETALOL HCL 300 MG PO TABS
300.0000 mg | ORAL_TABLET | Freq: Two times a day (BID) | ORAL | Status: DC
  Administered 2021-12-24 – 2021-12-28 (×9): 300 mg via ORAL
  Filled 2021-12-24: qty 1
  Filled 2021-12-24: qty 2
  Filled 2021-12-24 (×3): qty 1
  Filled 2021-12-24: qty 2
  Filled 2021-12-24: qty 1
  Filled 2021-12-24: qty 2
  Filled 2021-12-24 (×2): qty 1

## 2021-12-24 MED ORDER — ONDANSETRON HCL 4 MG/2ML IJ SOLN
4.0000 mg | Freq: Four times a day (QID) | INTRAMUSCULAR | Status: DC | PRN
  Administered 2021-12-25 – 2021-12-28 (×2): 4 mg via INTRAVENOUS
  Filled 2021-12-24 (×2): qty 2

## 2021-12-24 MED ORDER — LIDOCAINE-EPINEPHRINE 1 %-1:100000 IJ SOLN
INTRAMUSCULAR | Status: AC
  Filled 2021-12-24: qty 1

## 2021-12-24 MED ORDER — ALTEPLASE 2 MG IJ SOLR: 2.0000 mg | Freq: Once | INTRAMUSCULAR | Status: DC | PRN

## 2021-12-24 MED ORDER — CLONIDINE HCL 0.2 MG PO TABS
0.2000 mg | ORAL_TABLET | Freq: Two times a day (BID) | ORAL | Status: DC
Start: 2021-12-24 — End: 2021-12-30
  Administered 2021-12-24 – 2021-12-30 (×12): 0.2 mg via ORAL
  Filled 2021-12-24 (×13): qty 1

## 2021-12-24 MED ORDER — AMLODIPINE BESYLATE 10 MG PO TABS
10.0000 mg | ORAL_TABLET | Freq: Every day | ORAL | Status: DC
  Administered 2021-12-24 – 2022-01-02 (×9): 10 mg via ORAL
  Filled 2021-12-24 (×3): qty 1
  Filled 2021-12-24: qty 2
  Filled 2021-12-24 (×4): qty 1
  Filled 2021-12-24: qty 2
  Filled 2021-12-24: qty 1

## 2021-12-24 MED ORDER — CHLORHEXIDINE GLUCONATE CLOTH 2 % EX PADS
6.0000 | MEDICATED_PAD | Freq: Every day | CUTANEOUS | Status: DC
  Administered 2021-12-26: 6 via TOPICAL

## 2021-12-24 MED ORDER — SODIUM CHLORIDE 0.9 % IV SOLN: 100.0000 mL | INTRAVENOUS | Status: DC | PRN

## 2021-12-24 MED ORDER — PENTAFLUOROPROP-TETRAFLUOROETH EX AERO: 1.0000 "application " | INHALATION_SPRAY | CUTANEOUS | Status: DC | PRN

## 2021-12-24 MED ORDER — HEPARIN SODIUM (PORCINE) 1000 UNIT/ML DIALYSIS
1000.0000 [IU] | INTRAMUSCULAR | Status: DC | PRN
  Filled 2021-12-24 (×2): qty 1

## 2021-12-24 MED ORDER — LIDOCAINE HCL (PF) 1 % IJ SOLN: 5.0000 mL | INTRAMUSCULAR | Status: DC | PRN

## 2021-12-24 MED ORDER — LIDOCAINE-PRILOCAINE 2.5-2.5 % EX CREA: 1.0000 "application " | TOPICAL_CREAM | CUTANEOUS | Status: DC | PRN

## 2021-12-24 MED ORDER — ACETAMINOPHEN 325 MG PO TABS
650.0000 mg | ORAL_TABLET | Freq: Four times a day (QID) | ORAL | Status: DC | PRN
  Administered 2021-12-27 – 2022-01-02 (×4): 650 mg via ORAL
  Filled 2021-12-24 (×4): qty 2

## 2021-12-24 MED ORDER — SODIUM CHLORIDE 0.9 % IV SOLN: 10.0000 mL/h | Freq: Once | INTRAVENOUS | Status: DC

## 2021-12-24 MED ORDER — FUROSEMIDE 10 MG/ML IJ SOLN
160.0000 mg | Freq: Once | INTRAMUSCULAR | Status: AC
  Administered 2021-12-24: 160 mg via INTRAVENOUS
  Filled 2021-12-24: qty 16

## 2021-12-24 MED ORDER — SODIUM CHLORIDE 0.9 % IV SOLN
500.0000 mg | Freq: Once | INTRAVENOUS | Status: AC
  Administered 2021-12-24: 500 mg via INTRAVENOUS
  Filled 2021-12-24: qty 5

## 2021-12-24 MED ORDER — LIDOCAINE-EPINEPHRINE 2 %-1:100000 IJ SOLN
INTRAMUSCULAR | Status: AC | PRN
  Administered 2021-12-24: 10 mL

## 2021-12-24 MED ORDER — SODIUM CHLORIDE 0.9 % IV SOLN
1.0000 g | Freq: Once | INTRAVENOUS | Status: AC
  Administered 2021-12-24: 1 g via INTRAVENOUS
  Filled 2021-12-24: qty 10

## 2021-12-24 NOTE — Progress Notes (Signed)
Patient seen earlier at our office for a hospital follow up appointment. Upon presentation to the office, patient stated that he needed an ambulance. Has been having hemoptysis for up to a week, spitting up clots, also with epistaxis. He reports that his SBP has been ranging around 150-160s at home. Was in resp distress, not able to speak in full sentences, coarse breath sounds bilaterally. We had called EMS to get him to the hospital ASAP for further work up. Given his worsening CKD, he may very well need to start renal replacement therapy in-house which we discussed briefly this morning. ? ?Roy Quint, MD ?[facility] ?

## 2021-12-24 NOTE — ED Notes (Signed)
Patient transported to IR 

## 2021-12-24 NOTE — ED Notes (Signed)
Dialysis nurse given report.  ?

## 2021-12-24 NOTE — H&P (Addendum)
? ? ? ?Date: 12/24/2021     ?     ?     ?Patient Name:  Roy Randolph MRN: [id number]  ?DOB: 21-Aug-1976 Age / Sex: 46 y.o., male   ?PCP: Christiana Fuchs, DO    ?     ?Medical Service: Internal Medicine Teaching Service    ?     ?Attending Physician: Dr. Heber Armstrong, Rachel Moulds, DO    ?First Contact: Dr. Farrel Gordon Pager: [phone]  ?Second Contact: Dr. Linwood Dibbles Pager: [phone]  ?     ?After Hours (After 5p/  First Contact Pager: [phone]  ?weekends / holidays): Second Contact Pager: [phone]  ? ?Chief Complaint: Shortness of breath ? ?History of Present Illness: Roy Randolph is a 46 year old male with past medical history of anxiety with panic attacks newly treated with Zoloft, hypertension, new diagnosis of CKD stage IV (January 2023), pneumothorax in his 66s (was a thin smoker at that time) who presents from nephrology office where he was following up for new diagnosis of CKD stage IV where he began to feel severely ill, like he was "going to die." ? ?He states that he was most recently hospitalized earlier this month during which time he was in the ICU for extremely high blood pressure.  He has only been home for about 2 weeks, and in the last week he had symptom onset.  He complains of feeling like he is "underwater" when he sleeps, and that when he wakes up his mouth feels mummified and he pulls dried blood clots from his mouth.  He explains that his top 4 teeth are "not good" and feels that most of his oral bleeding is coming from those areas.  He has had episodes of gum bleeding over the course of his life but this is persistent over the past week.  Over the last several days he has also noted new blood clots coming out of his nose when he blows his nose with new onset nausea and this time as well.  He has had increased shortness of breath with "whistling sounds." He does not require oxygen at home at baseline. ? ?He endorses significant fatigue with rare dizziness, dyspnea on exertion even at minimal  distances, nausea with vomiting, diaphoresis and shaking prior to vomiting.  He is also had unintentional weight loss of around 40 pounds since November.  He denies bloody stools, fever, trouble with bowel or bladder. ? ?ED course: Microcytic anemia with hemoglobin 6.2 and MCV 79.5, thrombocytopenia 80, hyponatremia 130, low bicarb 14, elevated BUN 143, elevated creatinine 20.12, hypoalbuminemia 2.9, anion gap 17. ? ?Meds:  ?Current Meds  ?Medication Sig  ? acetaminophen (TYLENOL) 325 MG tablet Take 325-650 mg by mouth every 6 (six) hours as needed for mild pain or headache.  ? amLODipine (NORVASC) 10 MG tablet Take 1 tablet (10 mg total) by mouth daily.  ? Ascorbic Acid (VITAMIN C PO) Take 1 tablet by mouth daily.  ? cloNIDine (CATAPRES) 0.2 MG tablet Take 1 tablet (0.2 mg total) by mouth 2 (two) times daily.  ? labetalol (NORMODYNE) 300 MG tablet Take 1 tablet (300 mg total) by mouth 2 (two) times daily.  ? Multiple Vitamins-Minerals (CERTAVITE/ANTIOXIDANTS) TABS Take 1 tablet by mouth at bedtime.  ? pantoprazole (PROTONIX) 40 MG tablet Take 1 tablet (40 mg total) by mouth daily.  ? Potassium 95 MG TABS Take 95 mg by mouth daily.  ? sertraline (ZOLOFT) 25 MG tablet Take 1 tablet (25 mg total) by mouth  daily.  ? sodium chloride (OCEAN) 0.65 % SOLN nasal spray Place 1 spray into both nostrils as needed for congestion.  ? ? ? ?Allergies: ?Allergies as of 12/24/2021 - Review Complete 12/24/2021  ?Allergen Reaction Noted  ? Cucumber extract Anaphylaxis and Itching 11/30/2021  ? Other Anaphylaxis, Itching, Swelling, and Other (See Comments) 11/30/2021  ? Tomato (diagnostic) Anaphylaxis and Swelling 10/10/2021  ? Wild lettuce [wild lettuce extract (lactuca virosa)] Anaphylaxis and Swelling 10/10/2021  ? Hydralazine Hives, Itching, and Other (See Comments) 11/30/2021  ? ?Past Medical History:  ?Diagnosis Date  ? Abnormal EKG 10/10/2021  ? Acute renal failure superimposed on stage 4 chronic kidney disease ([place]) 10/10/2021  ?  Alcohol use   ? Alcohol use disorder in remission 11/14/2021  ? Elevated troponin 10/10/2021  ? Epigastric pain 10/10/2021  ? H/O alcohol abuse 10/10/2021  ? History of migraine 11/14/2021  ? Hypertension   ? Hypertensive emergency due to non compliance 10/10/2021  ? Hypokalemia 10/10/2021  ? Left arm numbness and pain 10/10/2021  ? Marijuana use 10/10/2021  ? Noncompliance   ? NSAID long-term use 10/10/2021  ? PRES (posterior reversible encephalopathy syndrome)   ? Smoker 10/10/2021  ? Smoking   ? Syncope, vasovagal 10/10/2021  ? Thrombocytopenia ([place]) 10/10/2021  ? Vision disturbance 11/14/2021  ? Vomiting 10/10/2021  ? ? ?Family History: Patient reports that his grandmother has a history of gum bleeding.  There is an extensive history of hypertension and several family members.  His father died of a drug overdose.  His brother died of heart attack at age 71. ? ?Social History: Patient moved from [place] in 2021 and lives with his sister.  His PCP is Dr. Howie Ill in the [facility].  He previously smoked tobacco and quit in January of this year.  He occasionally smokes marijuana.  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 ? ?Review of Systems: ?A complete ROS was negative except as per HPI.  ? ?Physical Exam: ?Blood pressure (!) 165/111, pulse 88, temperature 98.3 ?F (36.8 ?C), temperature source Oral, resp. rate (!) 28, SpO2 98 %. ?Constitutional:Laying in bed at slight incline, in mild respiratory distress. ?HENT: Dried blood over left front 2 teeth with dark areas over his gums, possibly bruised.  Mucosa and gums are pale.  No bruising over palate. ?Cardio: Regular rate and rhythm with possible split S2. ?Pulm: Tachypnea with increased work of breathing on 5L [place]. ?Abdomen: Soft, nontender, nondistended. ?MSK: Negative for extremity edema. ?Skin: Skin is warm and dry.  No rashes or abrasions noted. ?Neuro: Alert and oriented x3.  No focal deficit noted. ?Psych: Anxious mood and affect. ? ?EKG: personally reviewed my  interpretation is sinus rhythm with LVH. Compared to tracings in January 2023, this is relatively unchanged. ? ?CXR: personally reviewed my interpretation is interval development of left perihilar and right infrahilar opacities concerning for pneumonia.  ? ? ?Assessment & Plan by Problem: ?Principal Problem: ?  ESRD needing dialysis ([place]) ? ?ESRD with need for HD ?Anion gap metabolic acidosis ?Patient admits to infrequent follow-up for routine healthcare over the course of his life and was diagnosed with CKD stage IV in January 2023.  Admission labs revealed significantly elevated BUN/creatinine at 143/20.12, with previous labs over the last several weeks with serum creatinine around 7.5-7.6 with a GFR decreased to 3 from 8. ?-Nephrology consulted, appreciate their assistance ?-Patient had dialysis catheter placed by IR prior to admission and will undergo HD sometime this evening ?-Lasix 160  mg IV for 1 dose ?-Trend renal function ?-Avoid nephrotoxic agents ? ?Acute hypoxic respiratory failure ?On exam patient is tachypneic with new O2 requirement of 5L [place].  Chest x-ray obtained in the ED showed interval development of left perihilar and right infrahilar opacities concerning for pneumonia and he received a one-time dose of azithromycin and ceftriaxone.  Respiratory panel is negative, lactic acid is within normal limits, patient is afebrile.  Differential includes malignancy given significant smoking history and unintentional weight loss, pulmonary edema given rapid progression of CKD to ESRD, PE due to overall immobility secondary to severe fatigue and new respiratory failure, infectious etiology though less likely.  Well's score of 2.5, placing patient in a moderate risk category for pulmonary embolism.  During previous admissions and extensive work-up for glomerular disease, including complement, ANCA, glomerular basement membrane antibodies, anti-dsDNA antibodies, ANA, IFA, ASO titer, SPEP were obtained and  unremarkable. Kappa/lambda light chains were elevated at 72.3/58.9 but with normal ratio of 1.23. Vasculitis is less likely at play here given the results of this work-up. ?-Trend CBC ?-F/u blood culture

## 2021-12-24 NOTE — Hospital Course (Addendum)
Began feeling a lot better after receiving blood transfusion. He has been able to ambulate well since yesterday, walked down the halls. Has not noticed any bleeding. ? ?Appetite has been fine, ate most of breakfast. ? ?_______________________________________________ ?ESRD with need for HD ?Anion gap metabolic acidosis ?Patient presented from nephrology office and was found to have significantly elevated BUN/creatinine at 143/20.12 and GFR of 3. He underwent temporary HD catheter placement by IR and had first HD session on 03/27. He received Lasix 160 mg IV for one dose for possible pulmonary edema. He underwent further HD sessions 03/29, 03/30, ***. He had tunneled dialysis catheter placed 16/01 without complication. Renal function showed gradual improvements with serial HD sessions and he will follow a TTSa schedule for HD as an outpatient. ? ?Acute hypoxic respiratory failure ?Patient was hypoxic with tachypnea with new O2 requirement of 5L [place] on presentation.  Chest x-ray obtained in the ED showed interval development of left perihilar and right infrahilar opacities concerning for pneumonia and he received a one-time dose of azithromycin and ceftriaxone.  Respiratory panel was negative, lactic acid was within normal limits, patient was afebrile.  Procalcitonin was elevated at 1.12 so azithromycin and ceftriaxone were continued, then discontinued 03/29 after repeat chest x-ray was obtained and favored pulmonary edema over infectious process. Blood cultures showed no growth***. Supplemental oxygen requirement decreased with serial HD sessions. ? ?-Pulm/card rehab at dc ?  ?Microcytic anemia ?Thrombycytopenia ?Patient presented with symptomatic microcytic anemia with hemoglobin of 6.2 and MCV 79.5 as well as thrombocytopenia of 80. He was experiencing hemoptysis, epistaxis, and gum bleeding but no other signs of blood loss. He declined blood transfusion throughout admission for religious reasons. He was started on  ESA and iron per nephrology service. Hgb did slowly downtrend but patient did not have worsening of symptoms of fatigue and weakness. Mucosal bleeding has ceased at time of discharge. ?  ?Hypertension ?Hypertension was consistent throughout admission, with worsening of BP with panic attacks. Home amlodipine, clonidine, and labetalol were continued with gradual trend towards normotension throughout admission.  ?  ?Hx anxiety with panic attacks ?Chronic condition. Continued home Zoloft 25 mg daily. ?

## 2021-12-24 NOTE — ED Provider Notes (Signed)
?[facility] ?Provider Note ? ? ?CSN: [id number] ?Arrival date & time:    ? ?  ? ?History ? ?Chief Complaint  ?Patient presents with  ? Hypertension  ? ? ?Roy Randolph is a 46 y.o. male. ? ?HPI ?Patient presents for evaluation of bleeding which she noticed in his mouth.  He is unsure but thinks it is coming from his stomach.  This has been going on for several days.  He has had periods of waking up at night with blood in his mouth which he spits out.  This morning he went to his nephrology office for routine follow-up and because of the bleeding was directed here for evaluation.  He has stage IV kidney disease with high blood pressure.  He has had some medication noncompliance earlier in the year and was hospitalized for that.  He denies prior history of liver disease. ?  ? ?Home Medications ?Prior to Admission medications   ?Medication Sig Start Date End Date Taking? Authorizing Provider  ?acetaminophen (TYLENOL) 325 MG tablet Take 325-650 mg by mouth every 6 (six) hours as needed for mild pain or headache.   Yes [provider]  ?amLODipine (NORVASC) 10 MG tablet Take 1 tablet (10 mg total) by mouth daily. 12/14/21  Yes Masters, Joellen Jersey, DO  ?Ascorbic Acid (VITAMIN C PO) Take 1 tablet by mouth daily.   Yes [provider]  ?cloNIDine (CATAPRES) 0.2 MG tablet Take 1 tablet (0.2 mg total) by mouth 2 (two) times daily. 12/20/21  Yes Masters, Katie, DO  ?labetalol (NORMODYNE) 300 MG tablet Take 1 tablet (300 mg total) by mouth 2 (two) times daily. 12/14/21  Yes Masters, Joellen Jersey, DO  ?Multiple Vitamins-Minerals (CERTAVITE/ANTIOXIDANTS) TABS Take 1 tablet by mouth at bedtime. 12/07/21  Yes Ghimire, Henreitta Leber, MD  ?pantoprazole (PROTONIX) 40 MG tablet Take 1 tablet (40 mg total) by mouth daily. 12/07/21  Yes Ghimire, Henreitta Leber, MD  ?Potassium 95 MG TABS Take 95 mg by mouth daily.   Yes [provider]  ?sertraline (ZOLOFT) 25 MG tablet Take 1 tablet (25 mg  total) by mouth daily. 12/20/21  Yes Masters, Joellen Jersey, DO  ?sodium chloride (OCEAN) 0.65 % SOLN nasal spray Place 1 spray into both nostrils as needed for congestion. 12/20/21  Yes Masters, Joellen Jersey, DO  ?   ? ?Allergies    ?Cucumber extract, Other, Tomato (diagnostic), Wild lettuce [wild lettuce extract (lactuca virosa)], and Hydralazine   ? ?Review of Systems   ?Review of Systems ? ?Physical Exam ?Updated Vital Signs ?BP (!) 186/119   Pulse 92   Temp 98.3 ?F (36.8 ?C) (Oral)   Resp (!) 34   SpO2 98%  ?Physical Exam ? ?ED Results / Procedures / Treatments   ?Labs ?(all labs ordered are listed, but only abnormal results are displayed) ?Labs Reviewed  ?COMPREHENSIVE METABOLIC PANEL - Abnormal; Notable for the following components:  ?    Result Value  ? Sodium 130 (*)   ? CO2 14 (*)   ? Glucose, Bld 127 (*)   ? BUN 143 (*)   ? Creatinine, Ser 20.12 (*)   ? Calcium 8.8 (*)   ? Albumin 2.9 (*)   ? GFR, Estimated 3 (*)   ? Anion gap 17 (*)   ? All other components within normal limits  ?CBC WITH DIFFERENTIAL/PLATELET - Abnormal; Notable for the following components:  ? RBC 2.44 (*)   ? Hemoglobin 6.2 (*)   ? HCT 19.4 (*)   ?  MCV 79.5 (*)   ? MCH 25.4 (*)   ? RDW 17.7 (*)   ? Platelets 80 (*)   ? Lymphs Abs 0.5 (*)   ? All other components within normal limits  ?CULTURE, BLOOD (ROUTINE X 2)  ?CULTURE, BLOOD (ROUTINE X 2)  ?RESPIRATORY PANEL BY PCR  ?RESP PANEL BY RT-PCR (FLU A&B, COVID) [id number]  ?LACTIC ACID, PLASMA  ?LACTIC ACID, PLASMA  ?TYPE AND SCREEN  ?ABO/RH  ?PREPARE RBC (CROSSMATCH)  ? ? ?EKG ?EKG Interpretation ? ?Date/Time:  Monday December 24 2021 09:48:34 EDT ?Ventricular Rate:  89 ?PR Interval:  141 ?QRS Duration: 101 ?QT Interval:  384 ?QTC Calculation: 468 ?R Axis:   76 ?Text Interpretation: Sinus rhythm Left ventricular hypertrophy Nonspecific T abnormalities, lateral leads Since last tracing rate slower Otherwise no significant change Confirmed by Daleen Bo [phone]) on 12/24/2021 11:00:29 AM ? ?Radiology ?DG  Chest Port 1 View ? ?Result Date: 12/24/2021 ?CLINICAL DATA:  Hemoptysis. EXAM: PORTABLE CHEST 1 VIEW COMPARISON:  November 30, 2021. FINDINGS: The heart size and mediastinal contours are within normal limits. Interval development of left perihilar and right infrahilar opacities are noted concerning for pneumonia. The visualized skeletal structures are unremarkable. IMPRESSION: Interval development of left perihilar and right infrahilar opacities concerning for pneumonia. Followup PA and lateral chest X-ray is recommended in 3-4 weeks following trial of antibiotic therapy to ensure resolution and exclude underlying malignancy. Electronically Signed   By: Marijo Conception M.D.   On: 12/24/2021 10:49   ? ?Procedures ?Procedures  ? ? ?Medications Ordered in ED ?Medications  ?amLODipine (NORVASC) tablet 10 mg (10 mg Oral Given 12/24/21 1053)  ?cloNIDine (CATAPRES) tablet 0.2 mg (0.2 mg Oral Given 12/24/21 1053)  ?labetalol (NORMODYNE) tablet 300 mg (300 mg Oral Given 12/24/21 1053)  ?cefTRIAXone (ROCEPHIN) 1 g in sodium chloride 0.9 % 100 mL IVPB (1 g Intravenous New Bag/Given 12/24/21 1427)  ?azithromycin (ZITHROMAX) 500 mg in sodium chloride 0.9 % 250 mL IVPB (500 mg Intravenous New Bag/Given 12/24/21 1428)  ?0.9 %  sodium chloride infusion (has no administration in time range)  ?furosemide (LASIX) 160 mg in dextrose 5 % 50 mL IVPB (has no administration in time range)  ? ? ?ED Course/ Medical Decision Making/ A&P ?Clinical Course as of 12/24/21 1448  ?Mon Dec 24, 2021  ?1443 Patient is now declining blood transfusion.  He and his sister have requested erythropoietin.  I instructed them that they would need to ask the nephrologist about that.  Patient is pending central line dialysis catheter placement by interventional radiology.  He is currently stable.  He has not had any more expectorant of blood.  He remains hypertensive.  No respiratory distress currently.  We will plan admission to medical service with nephrology to  manage dialysis. [EW]  ?  ?Clinical Course User Index ?[EW] Daleen Bo, MD  ? ?                        ?Medical Decision Making ?Patient with history of chronic kidney disease, hypertension and medication noncompliance presenting with new complaint of hematemesis.  Blood pressure elevated on arrival.  He went to his nephrologist this morning for routine follow-up visit.  He was directed here for further evaluation.  Mentation normal on arrival.  No respiratory distress on arrival.  No clinical evidence for fluid overload.  Patient requires evaluation for GI source of bleeding, [place] upper GI bleed esophageal versus gastric.  No history of esophageal  varices. ? ?Problems Addressed: ?Anemia, unspecified type: acute illness or injury ?   Details: Worsening acute on chronic. ?End stage renal disease ([place]): acute illness or injury ?   Details: Worsening anemia with intravascular volume overload ?Hematemesis, unspecified whether nausea present: undiagnosed new problem with uncertain prognosis ?   Details: Onset several days ago.  No prior history of liver disease.  Currently being treated with a PPI. ?Hemoptysis: acute illness or injury ?   Details: For several days.  No mucus with hemoptysis. ?Hypertension, unspecified type: chronic illness or injury ?   Details: History of noncompliance with medication ? ?Amount and/or Complexity of Data Reviewed ?Independent Historian:  ?   Details: He is a cogent historian ?External Data Reviewed: notes. ?   Details: Notes with patient reviewed from renal consultation on 11/01/2021.  Diagnosed stage IIIb chronic kidney disease, anemia of kidney disease, tobacco abuse, history of alcohol abuse, history of NSAID use and hypertension.  GFR 26. ?Labs: ordered. ?   Details: CBC, metabolic panel, lactate, blood cultures-normal except hemoglobin low, BUN high, creatinine high, calcium low, albumin low ?Radiology: ordered and independent interpretation performed. ?   Details: Chest  x-ray-bilateral infiltrates, nonspecific ?ECG/medicine tests: ordered and independent interpretation performed. ?   Details: EPIC monitor-normal sinus rhythm ?Discussion of management or test interpretation with externa

## 2021-12-24 NOTE — ED Notes (Signed)
Pt noticed to have o2 sats around 89% on 5L [place]. Pt bumped to 6L and only imprved to 90%. Pt instructed to cough and provided with extra oxygen to help through NRB for a few minutes with o2 improving to 100% pt placed back on [place]. Pt tolerating [place] with 95-100% O2 sats  ?

## 2021-12-24 NOTE — Consult Note (Signed)
? ?Chief Complaint: ?Patient was seen in consultation today for  ?Need for hemodialysis access. Request is for temporary hemodialysis access. To be converted to tunneled HD when the patient is hemodynamically stable ? ?Referring Physician(s): ?Dr. Shirl Harris ? ?Supervising Physician: Ruthann Cancer ? ?Patient Status: [facility] - ED ? ?History of Present Illness: ?Roy Randolph is a 46 y.o. male  inpatient (in the ED). History of HTN, CKD. Found to have worsening renal function. Presented to the  nephrology office with hemoptyses X 2 week and epistaxis. Patient was transferred to the ED at [facility] for further evaluation.  Chest xray shows possible PNA. Blood pressure 185/119 respiration 22.  Team is requesting a tunneled dialysis catheter for dialysis access. Case discussed with IR Attending Dr. Keturah Barre. Serafina Royals. Due to patient condition IR will place a temporary hemodialysis catheter for conversion to a tunneled when the patient is hemodynamically stable.  ? ?Patient alert and laying in bed, calm. Initially able to speak in full sentences. Endorsing shob that resolved with rest and O2.  Denies any fevers, headache, chest pain, SOB, cough, abdominal pain, nausea, vomiting or bleeding. Return precautions and treatment recommendations and follow-up discussed with the patient who is agreeable with the plan. ? ? ?Past Medical History:  ?Diagnosis Date  ? Abnormal EKG 10/10/2021  ? Acute renal failure superimposed on stage 4 chronic kidney disease ([place]) 10/10/2021  ? Alcohol use   ? Alcohol use disorder in remission 11/14/2021  ? Elevated troponin 10/10/2021  ? Epigastric pain 10/10/2021  ? H/O alcohol abuse 10/10/2021  ? History of migraine 11/14/2021  ? Hypertension   ? Hypertensive emergency due to non compliance 10/10/2021  ? Hypokalemia 10/10/2021  ? Left arm numbness and pain 10/10/2021  ? Marijuana use 10/10/2021  ? Noncompliance   ? NSAID long-term use 10/10/2021  ? PRES (posterior reversible encephalopathy syndrome)   ? Smoker  10/10/2021  ? Smoking   ? Syncope, vasovagal 10/10/2021  ? Thrombocytopenia ([place]) 10/10/2021  ? Vision disturbance 11/14/2021  ? Vomiting 10/10/2021  ? ? ?Past Surgical History:  ?Procedure Laterality Date  ? THORACOTOMY Right   ? ? ?Allergies: ?Cucumber extract, Other, Tomato (diagnostic), Wild lettuce [wild lettuce extract (lactuca virosa)], and Hydralazine ? ?Medications: ?Prior to Admission medications   ?Medication Sig Start Date End Date Taking? Authorizing Provider  ?acetaminophen (TYLENOL) 325 MG tablet Take 325-650 mg by mouth every 6 (six) hours as needed for mild pain or headache.   Yes [provider]  ?amLODipine (NORVASC) 10 MG tablet Take 1 tablet (10 mg total) by mouth daily. 12/14/21  Yes Masters, Joellen Jersey, DO  ?Ascorbic Acid (VITAMIN C PO) Take 1 tablet by mouth daily.   Yes [provider]  ?cloNIDine (CATAPRES) 0.2 MG tablet Take 1 tablet (0.2 mg total) by mouth 2 (two) times daily. 12/20/21  Yes Masters, Katie, DO  ?labetalol (NORMODYNE) 300 MG tablet Take 1 tablet (300 mg total) by mouth 2 (two) times daily. 12/14/21  Yes Masters, Joellen Jersey, DO  ?Multiple Vitamins-Minerals (CERTAVITE/ANTIOXIDANTS) TABS Take 1 tablet by mouth at bedtime. 12/07/21  Yes Ghimire, Henreitta Leber, MD  ?pantoprazole (PROTONIX) 40 MG tablet Take 1 tablet (40 mg total) by mouth daily. 12/07/21  Yes Ghimire, Henreitta Leber, MD  ?Potassium 95 MG TABS Take 95 mg by mouth daily.   Yes [provider]  ?sertraline (ZOLOFT) 25 MG tablet Take 1 tablet (25 mg total) by mouth daily. 12/20/21  Yes Masters, Katie, DO  ?sodium chloride (OCEAN) 0.65 % SOLN nasal  spray Place 1 spray into both nostrils as needed for congestion. 12/20/21  Yes Masters, Joellen Jersey, DO  ?  ? ?Family History  ?Problem Relation Age of Onset  ? Hypertension Mother   ? Cervical cancer Mother   ? Breast cancer Sister   ? Sudden Cardiac Death Brother 84  ? Heart disease Maternal Grandmother   ? Cancer Maternal Grandmother   ? ? ?Social History  ? ?Socioeconomic  History  ? Marital status: Single  ?  Spouse name: Not on file  ? Number of children: Not on file  ? Years of education: Not on file  ? Highest education level: Not on file  ?Occupational History  ? Not on file  ?Tobacco Use  ? Smoking status: Every Day  ?  Packs/day: 0.30  ?  Types: Cigarettes  ?  Start date: 72  ? Smokeless tobacco: Not on file  ?Substance and Sexual Activity  ? Alcohol use: Not Currently  ?  Alcohol/week: 12.0 standard drinks  ?  Types: 12 Cans of beer per week  ?  Comment: stopped heavy drinking 2021  ? Drug use: Yes  ?  Types: Marijuana  ? Sexual activity: Not on file  ?Other Topics Concern  ? Not on file  ?Social History Narrative  ? Not on file  ? ?Social Determinants of Health  ? ?Financial Resource Strain: Not on file  ?Food Insecurity: Not on file  ?Transportation Needs: Not on file  ?Physical Activity: Not on file  ?Stress: Not on file  ?Social Connections: Not on file  ? ? ?Review of Systems: A 12 point ROS discussed and pertinent positives are indicated in the HPI above.  All other systems are negative. ? ?Review of Systems  ?Constitutional:  Negative for fever.  ?HENT:  Negative for congestion.   ?Respiratory:  Positive for cough (hemoptysis) and shortness of breath (self resolved with rest and O2.).   ?Cardiovascular:  Negative for chest pain.  ?Gastrointestinal:  Negative for abdominal pain.  ?Neurological:  Negative for headaches.  ?Psychiatric/Behavioral:  Negative for behavioral problems and confusion. The patient is nervous/anxious.   ? ?Vital Signs: ?BP (!) 186/119   Pulse 92   Temp 98.3 ?F (36.8 ?C) (Oral)   Resp (!) 34   SpO2 98%  ? ?Physical Exam ?Vitals and nursing note reviewed.  ?Constitutional:   ?   Appearance: He is well-developed. He is ill-appearing.  ?HENT:  ?   Head: Normocephalic.  ?Cardiovascular:  ?   Rate and Rhythm: Normal rate and regular rhythm.  ?Pulmonary:  ?   Breath sounds: Rhonchi present.  ?   Comments: tachypneic ?Musculoskeletal:     ?    General: Normal range of motion.  ?   Cervical back: Normal range of motion.  ?Neurological:  ?   Mental Status: He is alert and oriented to person, place, and time.  ?Psychiatric:     ?   Mood and Affect: Mood normal.     ?   Behavior: Behavior normal.  ? ? ?Imaging: ?DG Chest 2 View ? ?Result Date: 11/30/2021 ?CLINICAL DATA:  Provided history: Chest pain. EXAM: CHEST - 2 VIEW COMPARISON:  Chest radiographs 10/10/2021. FINDINGS: Heart size within normal limits. Focus of linear atelectasis versus scarring within the right lung base. No appreciable airspace consolidation. No evidence of pleural effusion or pneumothorax. No acute bony abnormality identified. IMPRESSION: Focus of linear atelectasis versus scarring within the lateral right lung base. Otherwise, no evidence of acute cardiopulmonary abnormality. Electronically  Signed   By: Kellie Simmering D.O.   On: 11/30/2021 17:30  ? ?CT HEAD WO CONTRAST (5MM) ? ?Result Date: 11/30/2021 ?CLINICAL DATA:  Provided history: Diplopia, headache, blurred vision, hypertension. EXAM: CT HEAD WITHOUT CONTRAST TECHNIQUE: Contiguous axial images were obtained from the base of the skull through the vertex without intravenous contrast. RADIATION DOSE REDUCTION: This exam was performed according to the departmental dose-optimization program which includes automated exposure control, adjustment of the mA and/or kV according to patient size and/or use of iterative reconstruction technique. COMPARISON:  Head CT 10/10/2021. FINDINGS: Brain: Cerebral volume is normal. There is symmetric abnormal cortical/subcortical hypodensity within the bilateral occipital lobes. Additionally, there is apparent mild abnormal hypodensity within the right cerebellar hemisphere (for instance as seen on series 2, image 6). There is no acute intracranial hemorrhage. No extra-axial fluid collection. No evidence of an intracranial mass. No midline shift. Vascular: No hyperdense vessel. Skull: Normal. Negative for  fracture or focal lesion. Sinuses/Orbits: Visualized orbits show no acute finding. No significant paranasal sinus disease at the imaged levels. These results were called by telephone at the time of interpretation on 3/3/202

## 2021-12-24 NOTE — Consult Note (Signed)
[facility] ?Renal Consultation Note ? ?Requesting MD: Eulis Foster ?Indication for Consultation: progressive CKD ? ?HPI:  ?Roy Randolph is a 46 y.o. male with past medical history significant for severe HTN-  non compliance with meds-  moved here from [place] in September of 2021. We first encountered him in January when crt was in the 2's.  He then presented just earlier this month with PRES-  crt 5.6 but worsened to 7.6.  Patient was not accepting of the diagnosis and would not consent to VVS consult last admit-  crt 8.8 on 3/17 when went to [facility] Internal med clinic.  Saw Dr. Candiss Norse this AM-  c/o resp distress, coughing up blood clots for 3 days-  bad taste in mouth-  labs here show crt of 20- BUN 143- hgb 6.2.  He understands that now dialysis is needed.  'If I had to wait for the arm to be ready I would die "  is anxious right now given what is to come ? ?Creatinine, Ser  ?Date/Time Value Ref Range Status  ?12/24/2021 11:30 AM 20.12 (H) 0.61 - 1.24 mg/dL Final  ?12/14/2021 10:53 AM 8.82 (H) 0.76 - 1.27 mg/dL Final  ?12/07/2021 06:38 AM 7.61 (H) 0.61 - 1.24 mg/dL Final  ?12/06/2021 02:55 AM 7.65 (H) 0.61 - 1.24 mg/dL Final  ?12/05/2021 05:36 AM 7.55 (H) 0.61 - 1.24 mg/dL Final  ?12/04/2021 03:33 AM 7.21 (H) 0.61 - 1.24 mg/dL Final  ?12/03/2021 04:19 AM 7.57 (H) 0.61 - 1.24 mg/dL Final  ?12/02/2021 03:05 AM 7.84 (H) 0.61 - 1.24 mg/dL Final  ?12/01/2021 02:51 AM 6.19 (H) 0.61 - 1.24 mg/dL Final  ?11/30/2021 11:24 PM 6.33 (H) 0.61 - 1.24 mg/dL Final  ?11/30/2021 06:42 PM 6.50 (H) 0.61 - 1.24 mg/dL Final  ?11/30/2021 05:07 PM 5.62 (H) 0.61 - 1.24 mg/dL Final  ?11/12/2021 03:29 PM 3.72 (H) 0.76 - 1.27 mg/dL Final  ?10/15/2021 04:38 AM 2.40 (H) 0.61 - 1.24 mg/dL Final  ?10/14/2021 05:06 AM 2.09 (H) 0.61 - 1.24 mg/dL Final  ?10/13/2021 02:33 AM 2.11 (H) 0.61 - 1.24 mg/dL Final  ?10/12/2021 02:43 AM 1.81 (H) 0.61 - 1.24 mg/dL Final  ?10/11/2021 09:35 AM 2.11 (H) 0.61 - 1.24 mg/dL Final  ?10/11/2021 02:36 AM 2.01  (H) 0.61 - 1.24 mg/dL Final  ?10/10/2021 05:58 PM 2.31 (H) 0.61 - 1.24 mg/dL Final  ?10/10/2021 11:10 AM 2.30 (H) 0.61 - 1.24 mg/dL Final  ? ? ? ?PMHx: ?  ?Past Medical History:  ?Diagnosis Date  ? Abnormal EKG 10/10/2021  ? Acute renal failure superimposed on stage 4 chronic kidney disease ([place]) 10/10/2021  ? Alcohol use   ? Alcohol use disorder in remission 11/14/2021  ? Elevated troponin 10/10/2021  ? Epigastric pain 10/10/2021  ? H/O alcohol abuse 10/10/2021  ? History of migraine 11/14/2021  ? Hypertension   ? Hypertensive emergency due to non compliance 10/10/2021  ? Hypokalemia 10/10/2021  ? Left arm numbness and pain 10/10/2021  ? Marijuana use 10/10/2021  ? Noncompliance   ? NSAID long-term use 10/10/2021  ? PRES (posterior reversible encephalopathy syndrome)   ? Smoker 10/10/2021  ? Smoking   ? Syncope, vasovagal 10/10/2021  ? Thrombocytopenia ([place]) 10/10/2021  ? Vision disturbance 11/14/2021  ? Vomiting 10/10/2021  ? ? ?Past Surgical History:  ?Procedure Laterality Date  ? THORACOTOMY Right   ? ? ?Family Hx:  ?Family History  ?Problem Relation Age of Onset  ? Hypertension Mother   ? Cervical cancer Mother   ? Breast cancer  Sister   ? Sudden Cardiac Death Brother 43  ? Heart disease Maternal Grandmother   ? Cancer Maternal Grandmother   ? ? ?Social History:  reports that he has been smoking cigarettes. He started smoking about 25 years ago. He has been smoking an average of .3 packs per day. He does not have any smokeless tobacco history on file. He reports that he does not currently use alcohol after a past usage of about 12.0 standard drinks per week. He reports current drug use. Drug: Marijuana. ? ?Allergies:  ?Allergies  ?Allergen Reactions  ? Cucumber Extract Anaphylaxis and Itching  ? Other Anaphylaxis, Itching, Swelling and Other (See Comments)  ?  The patient CANNOT eat raw/fresh fruit or vegetables. Reaction starts with ears itching, then progresses from there. He can eat canned versions of these foods, however.   ? Tomato (Diagnostic) Anaphylaxis and Swelling  ? Wild Lettuce [Wild Lettuce Extract (Lactuca Virosa)] Anaphylaxis and Swelling  ? Hydralazine Hives, Itching and Other (See Comments)  ?  SEVERE ITCHING  ? ? ?Medications: ?Prior to Admission medications   ?Medication Sig Start Date End Date Taking? Authorizing Provider  ?acetaminophen (TYLENOL) 325 MG tablet Take 325-650 mg by mouth every 6 (six) hours as needed for mild pain or headache.   Yes [provider]  ?amLODipine (NORVASC) 10 MG tablet Take 1 tablet (10 mg total) by mouth daily. 12/14/21  Yes Masters, Joellen Jersey, DO  ?Ascorbic Acid (VITAMIN C PO) Take 1 tablet by mouth daily.   Yes [provider]  ?cloNIDine (CATAPRES) 0.2 MG tablet Take 1 tablet (0.2 mg total) by mouth 2 (two) times daily. 12/20/21  Yes Masters, Katie, DO  ?labetalol (NORMODYNE) 300 MG tablet Take 1 tablet (300 mg total) by mouth 2 (two) times daily. 12/14/21  Yes Masters, Joellen Jersey, DO  ?Multiple Vitamins-Minerals (CERTAVITE/ANTIOXIDANTS) TABS Take 1 tablet by mouth at bedtime. 12/07/21  Yes Ghimire, Henreitta Leber, MD  ?pantoprazole (PROTONIX) 40 MG tablet Take 1 tablet (40 mg total) by mouth daily. 12/07/21  Yes Ghimire, Henreitta Leber, MD  ?Potassium 95 MG TABS Take 95 mg by mouth daily.   Yes [provider]  ?sertraline (ZOLOFT) 25 MG tablet Take 1 tablet (25 mg total) by mouth daily. 12/20/21  Yes Masters, Joellen Jersey, DO  ?sodium chloride (OCEAN) 0.65 % SOLN nasal spray Place 1 spray into both nostrils as needed for congestion. 12/20/21  Yes Masters, Joellen Jersey, DO  ? ? I have reviewed the patient's current medications. ? ?Labs:  ?Results for orders placed or performed during the hospital encounter of 12/24/21 (from the past 48 hour(s))  ?Type and screen     Status: None (Preliminary result)  ? Collection Time: 12/24/21 10:40 AM  ?Result Value Ref Range  ? ABO/RH(D) O POS   ? Antibody Screen NEG   ? Sample Expiration    ?  12/27/2021,2359 ?Performed at [facility], [place] [address], [place], [place] [address] ?  ? Unit Number [id number]   ? Blood Component Type RED CELLS,LR   ? Unit division 00   ? Status of Unit ALLOCATED   ? Transfusion Status OK TO TRANSFUSE   ? Crossmatch Result Compatible   ?Prepare RBC (crossmatch)     Status: None  ? Collection Time: 12/24/21 10:40 AM  ?Result Value Ref Range  ? Order Confirmation    ?  ORDER PROCESSED BY BLOOD BANK ?Performed at [facility], [place] [address] [place], [place] [address] ?  ?Comprehensive metabolic  panel     Status: Abnormal  ? Collection Time: 12/24/21 11:30 AM  ?Result Value Ref Range  ? Sodium 130 (L) 135 - 145 mmol/L  ? Potassium 4.5 3.5 - 5.1 mmol/L  ? Chloride 99 98 - 111 mmol/L  ? CO2 14 (L) 22 - 32 mmol/L  ? Glucose, Bld 127 (H) 70 - 99 mg/dL  ?  Comment: Glucose reference range applies only to samples taken after fasting for at least 8 hours.  ? BUN 143 (H) 6 - 20 mg/dL  ? Creatinine, Ser 20.12 (H) 0.61 - 1.24 mg/dL  ? Calcium 8.8 (L) 8.9 - 10.3 mg/dL  ? Total Protein 7.4 6.5 - 8.1 g/dL  ? Albumin 2.9 (L) 3.5 - 5.0 g/dL  ? AST 21 15 - 41 U/L  ? ALT 13 0 - 44 U/L  ? Alkaline Phosphatase 72 38 - 126 U/L  ? Total Bilirubin 0.9 0.3 - 1.2 mg/dL  ? GFR, Estimated 3 (L) >60 mL/min  ?  Comment: (NOTE) ?Calculated using the CKD-EPI Creatinine Equation (2021) ?  ? Anion gap 17 (H) 5 - 15  ?  Comment: Performed at [facility], [place] [address], [place], [place] [address]  ?CBC with Differential     Status: Abnormal  ? Collection Time: 12/24/21 11:30 AM  ?Result Value Ref Range  ? WBC 6.6 4.0 - 10.5 K/uL  ? RBC 2.44 (L) 4.22 - 5.81 MIL/uL  ? Hemoglobin 6.2 (LL) 13.0 - 17.0 g/dL  ?  Comment: REPEATED TO VERIFY ?THIS CRITICAL RESULT HAS VERIFIED AND BEEN CALLED TO Driscilla Moats, RN BY DAVID APPIAH ON 03 27 2023 AT 1202, AND HAS BEEN READ BACK.  ?  ? HCT 19.4 (L) 39.0 - 52.0 %  ? MCV 79.5 (L) 80.0 - 100.0 fL  ? MCH 25.4 (L) 26.0 - 34.0 pg  ? MCHC 32.0 30.0 - 36.0 g/dL  ? RDW 17.7 (H) 11.5 - 15.5 %  ? Platelets 80 (L) 150 - 400 K/uL  ?   Comment: Immature Platelet Fraction may be ?clinically indicated, consider ?ordering this additional test ?[id number] ?  ? nRBC 0.0 0.0 - 0.2 %  ? Neutrophils Relative % 85 %  ? Neutro Abs 5.6 1.7 - 7.7

## 2021-12-24 NOTE — ED Notes (Signed)
Pt refused blood transfusion d/t religious belief . EDP made aware  ?

## 2021-12-24 NOTE — ED Notes (Signed)
Hbg 6.2. EDP made aware.  ?

## 2021-12-24 NOTE — ED Triage Notes (Signed)
Pt BIB [facility] from [facility] d/t HTN and coughing up blood for the past 5 days. Pt is a stage 4 [place] pt. A&O X4. Complaint w meds.  ? ?Bp 230/140 ?HR 90 ?Spo2 91% ?

## 2021-12-24 NOTE — ED Notes (Signed)
Hold off blood per IR request. Will try to get the pt to IR before 1530.  ?

## 2021-12-25 DIAGNOSIS — D631 Anemia in chronic kidney disease: Secondary | ICD-10-CM | POA: Diagnosis present

## 2021-12-25 DIAGNOSIS — N186 End stage renal disease: Secondary | ICD-10-CM | POA: Diagnosis not present

## 2021-12-25 DIAGNOSIS — Z992 Dependence on renal dialysis: Secondary | ICD-10-CM

## 2021-12-25 DIAGNOSIS — N19 Unspecified kidney failure: Secondary | ICD-10-CM | POA: Diagnosis present

## 2021-12-25 LAB — RENAL FUNCTION PANEL
Albumin: 2.6 g/dL — ABNORMAL LOW (ref 3.5–5.0)
Anion gap: 17 — ABNORMAL HIGH (ref 5–15)
BUN: 104 mg/dL — ABNORMAL HIGH (ref 6–20)
CO2: 15 mmol/L — ABNORMAL LOW (ref 22–32)
Calcium: 8.4 mg/dL — ABNORMAL LOW (ref 8.9–10.3)
Chloride: 99 mmol/L (ref 98–111)
Creatinine, Ser: 16.29 mg/dL — ABNORMAL HIGH (ref 0.61–1.24)
GFR, Estimated: 3 mL/min — ABNORMAL LOW (ref >60)
Glucose, Bld: 102 mg/dL — ABNORMAL HIGH (ref 70–99)
Phosphorus: 6.5 mg/dL — ABNORMAL HIGH (ref 2.5–4.6)
Potassium: 4 mmol/L (ref 3.5–5.1)
Sodium: 131 mmol/L — ABNORMAL LOW (ref 135–145)

## 2021-12-25 LAB — CBC
HCT: 17.3 % — ABNORMAL LOW (ref 39.0–52.0)
Hemoglobin: 5.8 g/dL — CL (ref 13.0–17.0)
MCH: 26.2 pg (ref 26.0–34.0)
MCHC: 33.5 g/dL (ref 30.0–36.0)
MCV: 78.3 fL — ABNORMAL LOW (ref 80.0–100.0)
Platelets: 83 10*3/uL — ABNORMAL LOW (ref 150–400)
RBC: 2.21 MIL/uL — ABNORMAL LOW (ref 4.22–5.81)
RDW: 18.2 % — ABNORMAL HIGH (ref 11.5–15.5)
WBC: 6.6 10*3/uL (ref 4.0–10.5)
nRBC: 0 % (ref 0.0–0.2)

## 2021-12-25 LAB — HEPATITIS B SURFACE ANTIBODY, QUANTITATIVE: Hep B S AB Quant (Post): 4.4 m[IU]/mL — ABNORMAL LOW (ref >9.9)

## 2021-12-25 MED ORDER — DARBEPOETIN ALFA 300 MCG/0.6ML IJ SOSY
300.0000 ug | PREFILLED_SYRINGE | INTRAMUSCULAR | Status: DC
  Administered 2021-12-26: 300 ug via INTRAVENOUS
  Filled 2021-12-25 (×3): qty 0.6

## 2021-12-25 MED ORDER — CHLORHEXIDINE GLUCONATE CLOTH 2 % EX PADS
6.0000 | MEDICATED_PAD | Freq: Every day | CUTANEOUS | Status: DC
  Administered 2021-12-26: 6 via TOPICAL

## 2021-12-25 MED ORDER — SODIUM CHLORIDE 0.9 % IV SOLN
250.0000 mg | Freq: Every day | INTRAVENOUS | Status: AC
  Administered 2021-12-25 – 2021-12-28 (×4): 250 mg via INTRAVENOUS
  Filled 2021-12-25 (×5): qty 20

## 2021-12-25 MED ORDER — SERTRALINE HCL 50 MG PO TABS
25.0000 mg | ORAL_TABLET | Freq: Every day | ORAL | Status: DC
  Administered 2021-12-28 – 2022-01-02 (×6): 25 mg via ORAL
  Filled 2021-12-25 (×9): qty 1

## 2021-12-25 MED ORDER — SODIUM CHLORIDE 0.9 % IV SOLN
500.0000 mg | INTRAVENOUS | Status: DC
  Administered 2021-12-25: 500 mg via INTRAVENOUS
  Filled 2021-12-25 (×2): qty 5

## 2021-12-25 MED ORDER — SODIUM CHLORIDE 0.9 % IV SOLN
1.0000 g | INTRAVENOUS | Status: DC
  Administered 2021-12-25: 1 g via INTRAVENOUS
  Filled 2021-12-25 (×2): qty 10

## 2021-12-25 MED ORDER — PANTOPRAZOLE SODIUM 40 MG PO TBEC
40.0000 mg | DELAYED_RELEASE_TABLET | Freq: Every day | ORAL | Status: DC
  Administered 2021-12-25 – 2022-01-02 (×9): 40 mg via ORAL
  Filled 2021-12-25 (×9): qty 1

## 2021-12-25 NOTE — ED Notes (Signed)
Pt verified he will not take PRBC. ?

## 2021-12-25 NOTE — Progress Notes (Signed)
Subjective:  had non tunneled HD cath and first HD in the middle of the night-  removed 1000-  he is a little better but not much- hgb 5.8-  ended up refusing transfusion  ? ?Objective ?Vital signs in last 24 hours: ?Vitals:  ? 12/25/21 0030 12/25/21 0058 12/25/21 0107 12/25/21 0745  ?BP: (!) 162/94 (!) 166/98 (!) 174/105 (!) 178/118  ?Pulse: 81 80 86 86  ?Resp:   (!) 34 (!) 26  ?Temp:   98.2 ?F (36.8 ?C)   ?TempSrc:   Oral   ?SpO2:   100% 91%  ? ?Weight change:  ? ?Intake/Output Summary (Last 24 hours) at 12/25/2021 0839 ?Last data filed at 12/25/2021 0107 ?Gross per 24 hour  ?Intake --  ?Output 1000 ml  ?Net -1000 ml  ? ? ?Assessment/Plan: 46 year old BM with progressive CKD over the last 2 mos-  now will need dialysis  ?1.Renal- progressive CKD that has been rapid over the last 2 mos-  thought to be due to HTN but has proteinuria and hematuria so very well could have a GN. However, the horse is clearly out of the barn at this point-  would not benefit from any salvage therapy-  it is time to start dialysis.  He agrees because he feels terrible-  s/p first HD in the middle of the night.   Will plan for second treatment hopefully first shift on 3/29 and then also on 3/30.  It sounds like he is willing to have AVF placed-  will stabilize first before calling VVS -  now with low hgb is complicating factor  ?2. Hypertension/volume  - clearly overloaded although interesting not really peripheral edema-  will challenge UF with HD ad also gave one time dose of iv lasix to assist-  UOP Not recorded.  CXR has the appearance of PNA and hemoptysis so will be started on abx as well.  Will see how he responds to abx and UF-  CXR not suggestive of pulmonary hemorrhage.   BP Is high on his multidrug regimen-  once volume status is optimized will likely be able to wean ?3. Anemia  - severe- anemia of CKD-   refused transfusion  will  start ESA and iron  ?4. Bones-  will check phos and PTH once we get him more stable  ?   ? ? ? ?Louis Meckel  ? ? ?Labs: ?Basic Metabolic Panel: ?Recent Labs  ?Lab 12/24/21 ?1130  ?NA 130*  ?K 4.5  ?CL 99  ?CO2 14*  ?GLUCOSE 127*  ?BUN 143*  ?CREATININE 20.12*  ?CALCIUM 8.8*  ? ?Liver Function Tests: ?Recent Labs  ?Lab 12/24/21 ?1130  ?AST 21  ?ALT 13  ?ALKPHOS 72  ?BILITOT 0.9  ?PROT 7.4  ?ALBUMIN 2.9*  ? ?No results for input(s): LIPASE, AMYLASE in the last 168 hours. ?No results for input(s): AMMONIA in the last 168 hours. ?CBC: ?Recent Labs  ?Lab 12/24/21 ?1130 12/25/21 ?2979  ?WBC 6.6 6.6  ?NEUTROABS 5.6  --   ?HGB 6.2* 5.8*  ?HCT 19.4* 17.3*  ?MCV 79.5* 78.3*  ?PLT 80* 83*  ? ?Cardiac Enzymes: ?No results for input(s): CKTOTAL, CKMB, CKMBINDEX, TROPONINI in the last 168 hours. ?CBG: ?No results for input(s): GLUCAP in the last 168 hours. ? ?Iron Studies: No results for input(s): IRON, TIBC, TRANSFERRIN, FERRITIN in the last 72 hours. ?Studies/Results: ?IR Fluoro Guide CV Line Right ? ?Result Date: 12/24/2021 ?INDICATION: 46 year old male with acute on chronic renal failure. EXAM: NON-TUNNELED CENTRAL  VENOUS HEMODIALYSIS CATHETER PLACEMENT WITH ULTRASOUND AND FLUOROSCOPIC GUIDANCE COMPARISON:  None. MEDICATIONS: None FLUOROSCOPY TIME:  0 minutes, 6 seconds (1 mGy) COMPLICATIONS: None immediate. PROCEDURE: Informed written consent was obtained from the patient after a discussion of the risks, benefits, and alternatives to treatment. Questions regarding the procedure were encouraged and answered. The right neck and chest were prepped with chlorhexidine in a sterile fashion, and a sterile drape was applied covering the operative field. Maximum barrier sterile technique with sterile gowns and gloves were used for the procedure. A timeout was performed prior to the initiation of the procedure. After the overlying soft tissues were anesthetized, a small venotomy incision was created and a micropuncture kit was utilized to access the internal jugular vein. Real-time ultrasound guidance was  utilized for vascular access including the acquisition of a permanent ultrasound image documenting patency of the accessed vessel. The microwire was utilized to measure appropriate catheter length. A Rosen wire was advanced to the level of the IVC. Under fluoroscopic guidance, the venotomy was serially dilated, ultimately allowing placement of a 15 cm temporary Trialysis catheter with tip ultimately terminating within the superior aspect of the right atrium. Final catheter positioning was confirmed and documented with a spot radiographic image. The catheter aspirates and flushes normally. The catheter was flushed with appropriate volume heparin dwells. The catheter exit site was secured with a 0-Prolene retention suture. A dressing was placed. The patient tolerated the procedure well without immediate post procedural complication. IMPRESSION: Successful placement of a right internal jugular approach 15 cm temporary dialysis catheter with tip terminating with in the superior aspect of the right atrium. The catheter is ready for immediate use. PLAN: This catheter may be converted to a tunneled dialysis catheter at a later date as indicated. Ruthann Cancer, MD [facility] Radiology Electronically Signed   By: Ruthann Cancer M.D.   On: 12/24/2021 16:32  ? ?IR US Guide Vasc Access Right ? ?Result Date: 12/24/2021 ?INDICATION: 46 year old male with acute on chronic renal failure. EXAM: NON-TUNNELED CENTRAL VENOUS HEMODIALYSIS CATHETER PLACEMENT WITH ULTRASOUND AND FLUOROSCOPIC GUIDANCE COMPARISON:  None. MEDICATIONS: None FLUOROSCOPY TIME:  0 minutes, 6 seconds (1 mGy) COMPLICATIONS: None immediate. PROCEDURE: Informed written consent was obtained from the patient after a discussion of the risks, benefits, and alternatives to treatment. Questions regarding the procedure were encouraged and answered. The right neck and chest were prepped with chlorhexidine in a sterile fashion,  and a sterile drape was applied covering the operative field. Maximum barrier sterile technique with sterile gowns and gloves were used for the procedure. A timeout was performed prior to the initiation of the procedure. After the overlying soft tissues were anesthetized, a small venotomy incision was created and a micropuncture kit was utilized to access the internal jugular vein. Real-time ultrasound guidance was utilized for vascular access including the acquisition of a permanent ultrasound image documenting patency of the accessed vessel. The microwire was utilized to measure appropriate catheter length. A Rosen wire was advanced to the level of the IVC. Under fluoroscopic guidance, the venotomy was serially dilated, ultimately allowing placement of a 15 cm temporary Trialysis catheter with tip ultimately terminating within the superior aspect of the right atrium. Final catheter positioning was confirmed and documented with a spot radiographic image. The catheter aspirates and flushes normally. The catheter was flushed with appropriate volume heparin dwells. The catheter exit site was secured with a 0-Prolene retention suture. A dressing was placed. The patient tolerated the procedure well without  immediate post procedural complication. IMPRESSION: Successful placement of a right internal jugular approach 15 cm temporary dialysis catheter with tip terminating with in the superior aspect of the right atrium. The catheter is ready for immediate use. PLAN: This catheter may be converted to a tunneled dialysis catheter at a later date as indicated. Ruthann Cancer, MD [facility] Radiology Electronically Signed   By: Ruthann Cancer M.D.   On: 12/24/2021 16:32  ? ?DG Chest Port 1 View ? ?Result Date: 12/24/2021 ?CLINICAL DATA:  Hemoptysis. EXAM: PORTABLE CHEST 1 VIEW COMPARISON:  November 30, 2021. FINDINGS: The heart size and mediastinal contours are within normal limits.  Interval development of left perihilar and right infrahilar opacities are noted concerning for pneumonia. The visualized skeletal structures are unremarkable. IMPRESSION: Interval development of left perihilar and right i

## 2021-12-25 NOTE — ED Notes (Signed)
Received pt ao x 4, per prior nurse, he is uncertain if patient received blood or not. States pt may have gotten 2 units, but uncertain. Reviewing prior note, states pt had refused blood transfusion d/t religious belief.  ?

## 2021-12-25 NOTE — Plan of Care (Signed)
  Problem: Education: Goal: Knowledge of General Education information will improve Description Including pain rating scale, medication(s)/side effects and non-pharmacologic comfort measures Outcome: Progressing   

## 2021-12-25 NOTE — ED Notes (Signed)
Pt returned from dialysis on NRB, placed on 2LNC, SpO2 maintained at 96%. ?

## 2021-12-25 NOTE — Progress Notes (Signed)
? ?HD#1 ?SUBJECTIVE:  ?Patient Summary: Roy Randolph is a 46 year old male with past medical history of anxiety with panic attacks newly treated with Zoloft, hypertension, new diagnosis of CKD stage IV (January 2023), pneumothorax in his 65s (was a thin smoker at that time) who presented from nephrology office where he was following up for new diagnosis of CKD stage IV where he began to feel severely ill, like he was "going to die."and admitted for CKD stage 4 now progressed to ESRD and symptomatic anemia.  ? ?Overnight Events: Patient underwent HD overnight and says he is feeling better. Hgb further decreased to 5.8. ? ?Interim History: Patient states that he is feeling better this morning since undergoing HD overnight. He has less blood clotting in his mouth and is not coughing up blood like he was. He has not had recurrent epistaxis. He tolerated HD well, in fact slept through much of it. His concern at this time is that he is seeing "flashes" of light, like they are coming from behind him, and things that aren't really there/happening like the wall moving. He says that when he was last hospitalized for his BP he lost his vision temporarily but that with better BP control it came back. The flashes have been occurring over the last several days.  ? ?OBJECTIVE:  ?Vital Signs: ?Vitals:  ? 12/25/21 0030 12/25/21 0058 12/25/21 0107 12/25/21 0745  ?BP: (!) 162/94 (!) 166/98 (!) 174/105 (!) 178/118  ?Pulse: 81 80 86 86  ?Resp:   (!) 34 (!) 26  ?Temp:   98.2 ?F (36.8 ?C)   ?TempSrc:   Oral   ?SpO2:   100% 91%  ? ?Supplemental O2: Nasal Cannula ?SpO2: 91 % ?O2 Flow Rate (L/min): 2 L/min ? ?Filed Weights  ? ? ? ?Intake/Output Summary (Last 24 hours) at 12/25/2021 0750 ?Last data filed at 12/25/2021 0107 ?Gross per 24 hour  ?Intake --  ?Output 1000 ml  ?Net -1000 ml  ? ?Net IO Since Admission: -1,000 mL [12/25/21 0750] ? ?Physical Exam: ?Constitutional:Chronically ill appearing gentleman, resting comfortably in bed.  No acute distress noted at rest, but increased respiratory effort while during speaking. ?HENT:Minimal dried blood over gums on upper L. No new or worsened dried blood on lips or in mouth. ?Cardio:Regular rate and rhythm. Possible S2 split auscultated. ?Pulm:Bilateral rhonchi with some crackles over L mid-lung field. ?Abdomen:Soft, nontender, nondistended. ?XHB:ZJIRCVEL for extremity edema.  ?Skin:Warm and dry.  ?Neuro:Alert and oriented x3. No focal deficit noted. ?Psych:Normal mood and affect. ? ?Patient Lines/Drains/Airways Status   ? ? Active Line/Drains/Airways   ? ? Name Placement date Placement time Site Days  ? Peripheral IV 12/24/21 20 G Anterior;Distal;Right;Upper Arm 12/24/21  --  Arm  1  ? Hemodialysis Catheter Right Internal jugular Double lumen Temporary (Non-Tunneled) 12/24/21  1555  Internal jugular  1  ? ?  ?  ? ?  ? ? ? ?ASSESSMENT/PLAN:  ?Assessment: ?Principal Problem: ?  ESRD needing dialysis ([employer]) ? ? ?Plan: ?ESRD with need for HD ?Anion gap metabolic acidosis ?Patient underwent HD overnight which he endorses tolerating well, in fact sleeping through most of the session.  ?-Nephrology consulted, appreciate their assistance ?-Plans for additional HD 03/29 and 03/32 ?-Patient is agreeable to AV fistula creation, however low Hgb and overall medical stability will be a barrier. Will hold off on consulting VVS for now ?-Will start ESA and iron ?-Trend renal function ?-Avoid nephrotoxic agents ?  ?Acute hypoxic respiratory failure ?Community acquired pneumonia ?Patient continues to  require supportive oxygen. At beginning of our exam he was tolerating 1.5L [place] with O2 saturations >90%, however with continued conversation and moving in bed, his O2 saturation dropped to 83-85% which required gradual increase of O2 to 5L and slow improvement of O2 saturations. He had a cough that was nonproductive intermittently throughout the exam. Overnight he had worsening of his respiratory status but after  intentional coughing had some decrease in supplemental needs. Procalcitonin of 1.12 which raises concern for infectious etiology; he is s/p one-time dose of azithromycin and ceftriaxone in the ED 03/27. Blood cultures show NGTD. Tachypnea and respiratory failure are felt to be related to pneumonia without sepsis. ?-Continue ceftriaxone 1 g IV daily, day 2/5 ?-Continue azithromycin 500 mg IV daily, day 2/3 ?-Trend CBC ?-Follow blood culture ?-Maintain O2 supplementation with SpO2 goal greater than 93% ?  ?Microcytic anemia ?Hgb dropped further to 5.8 overnight; again, patient is a [employer] and does not wish to receive any blood transfusion. With his ESRD it is likely that his anemia is multifactorial including from iron deficiency and secondary to chronic disease, and that his platelet count and quality is diminished secondary to disease state and uremia on admission. No signs of acute bleed at this time. Gum bleeding is improve, which I home will continue to improve/resolve with initiation of HD. ?-Nephrology consulted, appreciate their assistance ?-Will start patient on ESA and iron ?-Trend CBC ?  ?Hypertension ?Hypertension is unchanged from admission, morning value of 174/105 despite undergoing HD overnight. He had not yet received AM antihypertensives. I am worried that he is seeing flashes of light given persistent hypertension and have asked his nurse to go ahead and administer morning medications. ?-Continue home amlodipine 10 mg daily, clonidine 0.2 mg twice daily, labetalol 30 mg twice daily ?-CTM ?  ?Hx anxiety with panic attacks ?Patient reports increased frequency of panic attacks recently. He was started on Zoloft last week. ?-Continue Zoloft 25 mg daily ? ?Best Practice: ?Diet: Renal diet ?IVF: None ?VTE: Place and maintain sequential compression device Start: 12/25/21 0714 ?Code: Full ?AB: Azithromycin 500 mg IV daily, day 2/3; ceftriaxone 1 g IV daily, day 2/5 ?Family Contact: Tawana, called  and notified. ?DISPO: Anticipated discharge in 2-3 days to Home pending Medical stability. ? ?Signature: ?Farrel Gordon, D.O.  ?Internal Medicine Resident, PGY-1 ?[facility]  ?Pager: [phone] ?7:50 AM, 12/25/2021  ? ?Please contact the on call pager after 5 pm and on weekends at [phone]. ? ?

## 2021-12-25 NOTE — Progress Notes (Signed)
New Admission Note:  ? ?Arrival Method: stetcher  ?Mental Orientation:  ?Telemetry: Box 16 ?Assessment: Completed ?Skin: Intact ?IV: left hand  ?Pain: 0/10  ?Tubes: None ?Safety Measures: Safety Fall Prevention Plan has been discussed  ?Admission:  ?5 [place] Orientation: Patient has been orientated to the room, unit and staff.   ?Family:  ? ?Orders to be reviewed and implemented. Will continue to monitor the patient. Call light has been placed within reach and bed alarm has been activated.  ? ?Dillon Bjork, RN ?[place]  ?Phone: [phone]  ?

## 2021-12-26 ENCOUNTER — Inpatient Hospital Stay (HOSPITAL_COMMUNITY): Payer: Medicaid Other

## 2021-12-26 DIAGNOSIS — N186 End stage renal disease: Secondary | ICD-10-CM

## 2021-12-26 DIAGNOSIS — Z992 Dependence on renal dialysis: Secondary | ICD-10-CM

## 2021-12-26 LAB — D-DIMER, QUANTITATIVE: D-Dimer, Quant: 2.6 ug/mL-FEU — ABNORMAL HIGH (ref 0.00–0.50)

## 2021-12-26 MED ORDER — NEPRO/CARBSTEADY PO LIQD
237.0000 mL | Freq: Two times a day (BID) | ORAL | Status: DC
  Administered 2021-12-27 – 2022-01-02 (×9): 237 mL via ORAL

## 2021-12-26 MED ORDER — CALCIUM ACETATE (PHOS BINDER) 667 MG PO CAPS
667.0000 mg | ORAL_CAPSULE | Freq: Three times a day (TID) | ORAL | Status: DC
  Administered 2021-12-26 – 2022-01-02 (×17): 667 mg via ORAL
  Filled 2021-12-26 (×20): qty 1

## 2021-12-26 MED ORDER — RENA-VITE PO TABS
1.0000 | ORAL_TABLET | Freq: Every day | ORAL | Status: DC
  Administered 2021-12-26 – 2022-01-01 (×7): 1 via ORAL
  Filled 2021-12-26 (×7): qty 1

## 2021-12-26 MED ORDER — CEFAZOLIN SODIUM-DEXTROSE 2-4 GM/100ML-% IV SOLN
2.0000 g | INTRAVENOUS | Status: AC
  Administered 2021-12-27: 2 g via INTRAVENOUS

## 2021-12-26 MED ORDER — CHLORHEXIDINE GLUCONATE CLOTH 2 % EX PADS
6.0000 | MEDICATED_PAD | Freq: Every day | CUTANEOUS | Status: DC
  Administered 2021-12-27 – 2021-12-28 (×2): 6 via TOPICAL

## 2021-12-26 NOTE — Progress Notes (Signed)
Requested to see pt for out-pt HD needs. Spoke to pt via phone. Introduced self and explained role. Pt resides in [place] and prefers a clinic close to his home. Referral made to [facility] admissions this afternoon. Barrier to out-pt HD placement is that pt has out-of-state medicaid which will not cover out-pt HD in [place]. Discussed this with pt who reports he has lived in [place] since 2020 and had started working on [facility] in January of 2023. Pt states he has been unable to apply for Social Security Disability or SSI due to his recent health problems. Pt willing to speak with a hospital financial counselor to see if they can assist with this situation. Contacted RN CM to discuss the above and she has contacted Development worker, community staff to request assistance with pt's case. Pt is currently not working and states he will have to use cab/[employer] for transportation to/from HD if sister is unable to transport. Pt currently unable to drive due to vision (per pt). Will assist as needed.  ? ?Roy Randolph ?Renal Navigator ?[phone] ?

## 2021-12-26 NOTE — Progress Notes (Signed)
? ?HD#2 ?SUBJECTIVE:  ?Patient Summary: Roy Randolph is a 46 year old male with past medical history of anxiety with panic attacks newly treated with Zoloft, hypertension, new diagnosis of CKD stage IV (January 2023), pneumothorax in his 21s (was a thin smoker at that time) who presented from nephrology office where he was following up for new diagnosis of CKD stage IV where he began to feel severely ill, like he was "going to die."and admitted for CKD stage 4 now progressed to ESRD and symptomatic anemia.  ? ?Overnight Events: Oxygen requirement increased overnight to 6L [place]; D dimer obtained which was elevated to 2.60 however PE felt less likely to be source of increased O2 requirement. ? ?Interim History: Patient is resting comfortably in HD bed. He is in no distress and his respiratory status appears markedly improved. He does not have any complaints or concerns at this time. He did have some blood when blowing his nose this morning but has not had recurrence of oral bleeding. ? ?OBJECTIVE:  ?Vital Signs: ?Vitals:  ? 12/26/21 0409 12/26/21 0437 12/26/21 0437 12/26/21 0641  ?BP: (!) 161/105  (!) 152/93 (!) 163/96  ?Pulse: 82  81 82  ?Resp: 20     ?Temp: 98.1 ?F (36.7 ?C) 98.7 ?F (37.1 ?C) 98.8 ?F (37.1 ?C) 98.5 ?F (36.9 ?C)  ?TempSrc: Oral  Oral Oral  ?SpO2: 91%  96% 92%  ?Weight:      ?Height:      ? ?Supplemental O2: Room Air ?SpO2: 92 % ?O2 Flow Rate (L/min): 3 L/min ? ?Filed Weights  ? 12/25/21 1550  ?Weight: 73.3 kg  ? ? ? ?Intake/Output Summary (Last 24 hours) at 12/26/2021 0742 ?Last data filed at 12/26/2021 0200 ?Gross per 24 hour  ?Intake 486 ml  ?Output 0 ml  ?Net 486 ml  ? ?Net IO Since Admission: -514 mL [12/26/21 0742] ? ?Physical Exam: ?Constitutional:Chronically ill appearing gentleman, resting comfortably in HD bed. No acute distress. ?Cardio:Regular rate and rhythm.  ?Pulm:Clear to auscultation bilaterally on anterior auscultation.  ?Abdomen:Soft, nontender, nondistended. ?VQM:[id number] for  extremity edema.  ?Skin:Warm and dry.  ?Neuro:Alert and oriented x3. No focal deficit noted. ?Psych:Normal mood and affect. ? ?Patient Lines/Drains/Airways Status   ? ? Active Line/Drains/Airways   ? ? Name Placement date Placement time Site Days  ? Peripheral IV 12/24/21 20 G Anterior;Distal;Right;Upper Arm 12/24/21  --  Arm  2  ? Peripheral IV 12/25/21 20 G Anterior;Left Hand 12/25/21  --  Hand  1  ? Hemodialysis Catheter Right Internal jugular Double lumen Temporary (Non-Tunneled) 12/24/21  1555  Internal jugular  2  ? ?  ?  ? ?  ? ? ? ?ASSESSMENT/PLAN:  ?Assessment: ?Principal Problem: ?  ESRD needing dialysis ([place]) ?Active Problems: ?  Thrombocytopenia ([place]) ?  Essential hypertension ?  Uremia ?  Anemia of chronic kidney failure, stage 5 (HCC) ? ? ?Plan: ?ESRD with need for HD ?Anion gap metabolic acidosis ?Renal function shows improvement after initial HD session with serum creatinine 20.12>16.29, though this is still significantly elevated to previous measurements. He is set to undergo HD again today. ?-Nephrology consulted, appreciate their assistance ?-Plans for additional HD today and 04/02 ?-Will start ESA and iron ?-Trend renal function ?-Avoid nephrotoxic agents ?  ?Acute hypoxic respiratory failure ?Community acquired pneumonia ?Overnight patient required increased oxygen supplementation to 6L [place] from previous 2L [place]. He is on day 3 of antibiotic therapy for possible CAP. Blood cultures show NGTD. Given timing of desaturations and subjective improvement of  shortness of breath with initial dialysis session, I feel that he has a component of OSA and that he could have a component of pulmonary edema that, with continued HD sessions, has to potential to continue improving. ?-Continue ceftriaxone 1 g IV daily, day 3/5 ?-Continue azithromycin 500 mg IV daily, day 3/3 ?-Repeat chest x-ray after HD session today to assess for pulmonary edema; can consider stopping antibiotics based on what this shows ?-Follow  blood culture ?-Maintain O2 supplementation with SpO2 goal greater than 93% ?  ?Microcytic anemia ?Patient will receive ESA and iron with HD. His most recent CBC morning of 03/28 revealed Hgb of 5.8, decreased from 6.2 on admission. 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 I will watch for HD lab results to trend Hgb. ?-Nephrology consulted, appreciate their assistance ?-Will start patient on ESA and iron ?-Trend CBC ?  ?Hypertension ?Some improvement of BP noted over the last 24 hours with SBP now in 150's-160's rather than 190's. He denies persistence of flashing lights and vision changes since early in the evening yesterday. ?-Continue home amlodipine 10 mg daily, clonidine 0.2 mg twice daily, labetalol 30 mg twice daily ?-CTM ?  ?Hx anxiety with panic attacks ?Patient reports increased frequency of panic attacks recently. He was started on Zoloft last week. ?-Continue Zoloft 25 mg daily ? ?Best Practice: ?Diet: Renal diet ?IVF: None ?VTE: Place and maintain sequential compression device Start: 12/25/21 0714 ?Code: Full ?AB: Azithromycin 500 mg IV daily, day 3/3; ceftriaxone 1 g IV daily, day 3/5 ?Family Contact: Tawana, called and notified. ?DISPO: Anticipated discharge in 2-3 days to Home pending Medical stability. ? ?Signature: ?Farrel Gordon, D.O.  ?Internal Medicine Resident, PGY-1 ?[facility]  ?Pager: [phone] ?7:42 AM, 12/26/2021  ? ?Please contact the on call pager after 5 pm and on weekends at [phone]. ? ?

## 2021-12-26 NOTE — Procedures (Signed)
Patient was seen on dialysis and the procedure was supervised.  BFR 200  Via temp cath BP is  190/109. ? ? Patient appears to be tolerating treatment well ? ?Roy Randolph ?12/26/2021 ? ?

## 2021-12-26 NOTE — Consult Note (Signed)
? ?Chief Complaint: ?Patient was seen in consultation today for  ?Chief Complaint  ?Patient presents with  ? Hypertension  ? ? ?Referring Physician(s): Dr. Moshe Cipro ? ?Supervising Physician: Daryll Brod ? ?Patient Status: [facility] - In-pt ? ?History of Present Illness: ?Roy Randolph is a 46 y.o. male with a medical history significant for HTN, alcohol abuse, non-compliance and end stage renal disease. He was admitted 12/24/21 after he initially presented to his nephrology office with hemoptysis, epistaxis and worsening renal function. The nephrology team requested tunneled dialysis catheter placement for hemodialysis but due to the patient's condition at that time - possible pneumonia, BP 185/119 - IR placed a temporary catheter. Please see full consult note dated 12/24/21. The patient has stabilized and the nephrology team is requesting tunneled catheter placement.  ? ?Past Medical History:  ?Diagnosis Date  ? Abnormal EKG 10/10/2021  ? Acute renal failure superimposed on stage 4 chronic kidney disease ([place]) 10/10/2021  ? Alcohol use   ? Alcohol use disorder in remission 11/14/2021  ? Elevated troponin 10/10/2021  ? Epigastric pain 10/10/2021  ? H/O alcohol abuse 10/10/2021  ? History of migraine 11/14/2021  ? Hypertension   ? Hypertensive emergency due to non compliance 10/10/2021  ? Hypokalemia 10/10/2021  ? Left arm numbness and pain 10/10/2021  ? Marijuana use 10/10/2021  ? Noncompliance   ? NSAID long-term use 10/10/2021  ? PRES (posterior reversible encephalopathy syndrome)   ? Smoker 10/10/2021  ? Smoking   ? Syncope, vasovagal 10/10/2021  ? Thrombocytopenia ([place]) 10/10/2021  ? Vision disturbance 11/14/2021  ? Vomiting 10/10/2021  ? ? ?Past Surgical History:  ?Procedure Laterality Date  ? IR FLUORO GUIDE CV LINE RIGHT  12/24/2021  ? IR US GUIDE VASC ACCESS RIGHT  12/24/2021  ? THORACOTOMY Right   ? ? ?Allergies: ?Cucumber extract, Other, Tomato (diagnostic), Wild lettuce [wild lettuce extract (lactuca virosa)], and  Hydralazine ? ?Medications: ?Prior to Admission medications   ?Medication Sig Start Date End Date Taking? Authorizing Provider  ?acetaminophen (TYLENOL) 325 MG tablet Take 325-650 mg by mouth every 6 (six) hours as needed for mild pain or headache.   Yes [provider]  ?amLODipine (NORVASC) 10 MG tablet Take 1 tablet (10 mg total) by mouth daily. 12/14/21  Yes Masters, Joellen Jersey, DO  ?Ascorbic Acid (VITAMIN C PO) Take 1 tablet by mouth daily.   Yes [provider]  ?cloNIDine (CATAPRES) 0.2 MG tablet Take 1 tablet (0.2 mg total) by mouth 2 (two) times daily. 12/20/21  Yes Masters, Katie, DO  ?labetalol (NORMODYNE) 300 MG tablet Take 1 tablet (300 mg total) by mouth 2 (two) times daily. 12/14/21  Yes Masters, Joellen Jersey, DO  ?Multiple Vitamins-Minerals (CERTAVITE/ANTIOXIDANTS) TABS Take 1 tablet by mouth at bedtime. 12/07/21  Yes Ghimire, Henreitta Leber, MD  ?pantoprazole (PROTONIX) 40 MG tablet Take 1 tablet (40 mg total) by mouth daily. 12/07/21  Yes Ghimire, Henreitta Leber, MD  ?Potassium 95 MG TABS Take 95 mg by mouth daily.   Yes [provider]  ?sertraline (ZOLOFT) 25 MG tablet Take 1 tablet (25 mg total) by mouth daily. 12/20/21  Yes Masters, Joellen Jersey, DO  ?sodium chloride (OCEAN) 0.65 % SOLN nasal spray Place 1 spray into both nostrils as needed for congestion. 12/20/21  Yes Masters, Joellen Jersey, DO  ?  ? ?Family History  ?Problem Relation Age of Onset  ? Hypertension Mother   ? Cervical cancer Mother   ? Breast cancer Sister   ? Sudden Cardiac Death Brother 10  ?  Heart disease Maternal Grandmother   ? Cancer Maternal Grandmother   ? ? ?Social History  ? ?Socioeconomic History  ? Marital status: Single  ?  Spouse name: Not on file  ? Number of children: Not on file  ? Years of education: Not on file  ? Highest education level: Not on file  ?Occupational History  ? Not on file  ?Tobacco Use  ? Smoking status: Every Day  ?  Packs/day: 0.30  ?  Types: Cigarettes  ?  Start date: 61  ? Smokeless tobacco: Not on file   ?Substance and Sexual Activity  ? Alcohol use: Not Currently  ?  Alcohol/week: 12.0 standard drinks  ?  Types: 12 Cans of beer per week  ?  Comment: stopped heavy drinking 2021  ? Drug use: Yes  ?  Types: Marijuana  ? Sexual activity: Not on file  ?Other Topics Concern  ? Not on file  ?Social History Narrative  ? Not on file  ? ?Social Determinants of Health  ? ?Financial Resource Strain: Not on file  ?Food Insecurity: Not on file  ?Transportation Needs: Not on file  ?Physical Activity: Not on file  ?Stress: Not on file  ?Social Connections: Not on file  ? ? ?Review of Systems: A 12 point ROS discussed and pertinent positives are indicated in the HPI above.  All other systems are negative. ? ?Vital Signs: ?BP (!) 151/91   Pulse 76   Temp 97.9 ?F (36.6 ?C) (Oral)   Resp 17   Ht 5\' 9"  (1.753 m)   Wt 161 lb 9.6 oz (73.3 kg)   SpO2 100%   BMI 23.86 kg/m?  ? ?Physical Exam ?Constitutional:   ?   Appearance: He is not ill-appearing.  ?Cardiovascular:  ?   Comments: Right IJ temporary catheter. Dressing is clean/dry/intact.  ?Pulmonary:  ?   Effort: Pulmonary effort is normal.  ?Neurological:  ?   Mental Status: He is alert and oriented to person, place, and time.  ? ? ?Imaging: ?DG Chest 2 View ? ?Result Date: 12/26/2021 ?CLINICAL DATA:  Shortness of breath EXAM: CHEST - 2 VIEW COMPARISON:  12/24/2021 FINDINGS: Right internal jugular line tips at low right atrium. Midline trachea. Normal heart size. Suspect small right pleural effusion. No pneumothorax. Given differences in technique, similar perihilar interstitial and airspace disease bilaterally. IMPRESSION: No significant change in bilateral perihilar interstitial and airspace disease. Given history of end-stage renal disease, pulmonary edema is favored over multifocal infection. Suspect small right pleural effusion, similar. Electronically Signed   By: Abigail Miyamoto M.D.   On: 12/26/2021 12:08  ? ?DG Chest 2 View ? ?Result Date: 11/30/2021 ?CLINICAL DATA:   Provided history: Chest pain. EXAM: CHEST - 2 VIEW COMPARISON:  Chest radiographs 10/10/2021. FINDINGS: Heart size within normal limits. Focus of linear atelectasis versus scarring within the right lung base. No appreciable airspace consolidation. No evidence of pleural effusion or pneumothorax. No acute bony abnormality identified. IMPRESSION: Focus of linear atelectasis versus scarring within the lateral right lung base. Otherwise, no evidence of acute cardiopulmonary abnormality. Electronically Signed   By: Kellie Simmering D.O.   On: 11/30/2021 17:30  ? ?CT HEAD WO CONTRAST (5MM) ? ?Result Date: 11/30/2021 ?CLINICAL DATA:  Provided history: Diplopia, headache, blurred vision, hypertension. EXAM: CT HEAD WITHOUT CONTRAST TECHNIQUE: Contiguous axial images were obtained from the base of the skull through the vertex without intravenous contrast. RADIATION DOSE REDUCTION: This exam was performed according to the departmental dose-optimization program which includes  automated exposure control, adjustment of the mA and/or kV according to patient size and/or use of iterative reconstruction technique. COMPARISON:  Head CT 10/10/2021. FINDINGS: Brain: Cerebral volume is normal. There is symmetric abnormal cortical/subcortical hypodensity within the bilateral occipital lobes. Additionally, there is apparent mild abnormal hypodensity within the right cerebellar hemisphere (for instance as seen on series 2, image 6). There is no acute intracranial hemorrhage. No extra-axial fluid collection. No evidence of an intracranial mass. No midline shift. Vascular: No hyperdense vessel. Skull: Normal. Negative for fracture or focal lesion. Sinuses/Orbits: Visualized orbits show no acute finding. No significant paranasal sinus disease at the imaged levels. These results were called by telephone at the time of interpretation on 11/30/2021 at 5:40 pm to provider Dr. Roderic Palau, who verbally acknowledged these results. IMPRESSION: Symmetric abnormal  cortical/subcortical hypodensity within the bilateral occipital lobes. Additionally, there is apparent mild abnormal hypodensity within the right cerebellar hemisphere. These findings are highly suspicious for poste

## 2021-12-26 NOTE — Progress Notes (Signed)
Subjective:  seen on HD for his second treatment-  BP has still been high-  temp cath is not working great , 200 BFR- no new labs ? ?Objective ?Vital signs in last 24 hours: ?Vitals:  ? 12/26/21 0437 12/26/21 0437 12/26/21 0641 12/26/21 0730  ?BP:  (!) 152/93 (!) 163/96 (!) 190/109  ?Pulse:  81 82 88  ?Resp:    (!) 23  ?Temp: 98.7 ?F (37.1 ?C) 98.8 ?F (37.1 ?C) 98.5 ?F (36.9 ?C) 98 ?F (36.7 ?C)  ?TempSrc:  Oral Oral Temporal  ?SpO2:  96% 92% 94%  ?Weight:    73.3 kg  ?Height:      ? ?Weight change:  ? ?Intake/Output Summary (Last 24 hours) at 12/26/2021 0938 ?Last data filed at 12/26/2021 0200 ?Gross per 24 hour  ?Intake 486 ml  ?Output 0 ml  ?Net 486 ml  ? ? ?Assessment/Plan: 46 year old BM with progressive CKD over the last 2 mos-  now will need dialysis  ?1.Renal- progressive CKD that has been rapid over the last 2 mos-  thought to be due to HTN but has proteinuria and hematuria so very well could have a GN. However, the horse is clearly out of the barn at this point-.  He agreed to dialysis because he feels terrible-  s/p first HD in the middle of the night 3/27.    second treatment today then also want to do on 3/30.  It sounds like he is willing to have AVF placed-  will stabilize first before calling VVS -  now with low hgb is complicating factor.  His temp cath is not working great-  need to convert to [facility] ?2. Hypertension/volume  - clearly overloaded although interesting not really peripheral edema-  will challenge UF with HD ad also gave one time dose of iv lasix to assist-  UOP Not recorded.  CXR has the appearance of PNA and hemoptysis so will be started on abx as well.  Will see how he responds to abx and UF-  CXR not suggestive of pulmonary hemorrhage.   BP Is high on his multidrug regimen-  once volume status is optimized will hopefully be able to wean ?3. Anemia  - severe- anemia of CKD-   refused transfusion  -  giving high dose ESA and iron  ?4. Bones-  phos  6.5 -  will start phoslo-   check PTH  when able  ?  ? ? ? ?Louis Meckel  ? ? ?Labs: ?Basic Metabolic Panel: ?Recent Labs  ?Lab 12/24/21 ?1130 12/25/21 ?1939  ?NA 130* 131*  ?K 4.5 4.0  ?CL 99 99  ?CO2 14* 15*  ?GLUCOSE 127* 102*  ?BUN 143* 104*  ?CREATININE 20.12* 16.29*  ?CALCIUM 8.8* 8.4*  ?PHOS  --  6.5*  ? ?Liver Function Tests: ?Recent Labs  ?Lab 12/24/21 ?1130 12/25/21 ?1939  ?AST 21  --   ?ALT 13  --   ?ALKPHOS 72  --   ?BILITOT 0.9  --   ?PROT 7.4  --   ?ALBUMIN 2.9* 2.6*  ? ?No results for input(s): LIPASE, AMYLASE in the last 168 hours. ?No results for input(s): AMMONIA in the last 168 hours. ?CBC: ?Recent Labs  ?Lab 12/24/21 ?1130 12/25/21 ?2542  ?WBC 6.6 6.6  ?NEUTROABS 5.6  --   ?HGB 6.2* 5.8*  ?HCT 19.4* 17.3*  ?MCV 79.5* 78.3*  ?PLT 80* 83*  ? ?Cardiac Enzymes: ?No results for input(s): CKTOTAL, CKMB, CKMBINDEX, TROPONINI in the last 168 hours. ?CBG: ?  No results for input(s): GLUCAP in the last 168 hours. ? ?Iron Studies: No results for input(s): IRON, TIBC, TRANSFERRIN, FERRITIN in the last 72 hours. ?Studies/Results: ?IR Fluoro Guide CV Line Right ? ?Result Date: 12/24/2021 ?INDICATION: 46 year old male with acute on chronic renal failure. EXAM: NON-TUNNELED CENTRAL VENOUS HEMODIALYSIS CATHETER PLACEMENT WITH ULTRASOUND AND FLUOROSCOPIC GUIDANCE COMPARISON:  None. MEDICATIONS: None FLUOROSCOPY TIME:  0 minutes, 6 seconds (1 mGy) COMPLICATIONS: None immediate. PROCEDURE: Informed written consent was obtained from the patient after a discussion of the risks, benefits, and alternatives to treatment. Questions regarding the procedure were encouraged and answered. The right neck and chest were prepped with chlorhexidine in a sterile fashion, and a sterile drape was applied covering the operative field. Maximum barrier sterile technique with sterile gowns and gloves were used for the procedure. A timeout was performed prior to the initiation of the procedure. After the overlying soft tissues were anesthetized, a small venotomy  incision was created and a micropuncture kit was utilized to access the internal jugular vein. Real-time ultrasound guidance was utilized for vascular access including the acquisition of a permanent ultrasound image documenting patency of the accessed vessel. The microwire was utilized to measure appropriate catheter length. A Rosen wire was advanced to the level of the IVC. Under fluoroscopic guidance, the venotomy was serially dilated, ultimately allowing placement of a 15 cm temporary Trialysis catheter with tip ultimately terminating within the superior aspect of the right atrium. Final catheter positioning was confirmed and documented with a spot radiographic image. The catheter aspirates and flushes normally. The catheter was flushed with appropriate volume heparin dwells. The catheter exit site was secured with a 0-Prolene retention suture. A dressing was placed. The patient tolerated the procedure well without immediate post procedural complication. IMPRESSION: Successful placement of a right internal jugular approach 15 cm temporary dialysis catheter with tip terminating with in the superior aspect of the right atrium. The catheter is ready for immediate use. PLAN: This catheter may be converted to a tunneled dialysis catheter at a later date as indicated. Ruthann Cancer, MD [facility] Electronically Signed   By: Ruthann Cancer M.D.   On: 12/24/2021 16:32  ? ?IR US Guide Vasc Access Right ? ?Result Date: 12/24/2021 ?INDICATION: 46 year old male with acute on chronic renal failure. EXAM: NON-TUNNELED CENTRAL VENOUS HEMODIALYSIS CATHETER PLACEMENT WITH ULTRASOUND AND FLUOROSCOPIC GUIDANCE COMPARISON:  None. MEDICATIONS: None FLUOROSCOPY TIME:  0 minutes, 6 seconds (1 mGy) COMPLICATIONS: None immediate. PROCEDURE: Informed written consent was obtained from the patient after a discussion of the risks, benefits, and alternatives to treatment. Questions  regarding the procedure were encouraged and answered. The right neck and chest were prepped with chlorhexidine in a sterile fashion, and a sterile drape was applied covering the operative field. Maximum barrier sterile technique with sterile gowns and gloves were used for the procedure. A timeout was performed prior to the initiation of the procedure. After the overlying soft tissues were anesthetized, a small venotomy incision was created and a micropuncture kit was utilized to access the internal jugular vein. Real-time ultrasound guidance was utilized for vascular access including the acquisition of a permanent ultrasound image documenting patency of the accessed vessel. The microwire was utilized to measure appropriate catheter length. A Rosen wire was advanced to the level of the IVC. Under fluoroscopic guidance, the venotomy was serially dilated, ultimately allowing placement of a 15 cm temporary Trialysis catheter with tip ultimately terminating within the superior aspect of the right atrium.  Final catheter positioning was confirmed and documented with a spot radiographic image. The catheter aspirates and flushes normally. The catheter was flushed with appropriate volume heparin dwells. The catheter exit site was secured with a 0-Prolene retention suture. A dressing was placed. The patient tolerated the procedure well without immediate post procedural complication. IMPRESSION: Successful placement of a right internal jugular approach 15 cm temporary dialysis catheter with tip terminating with in the superior aspect of the right atrium. The catheter is ready for immediate use. PLAN: This catheter may be converted to a tunneled dialysis catheter at a later date as indicated. Ruthann Cancer, MD [facility] Electronically Signed   By: Ruthann Cancer M.D.   On: 12/24/2021 16:32  ? ?DG Chest Port 1 View ? ?Result Date: 12/24/2021 ?CLINICAL DATA:  Hemoptysis.  EXAM: PORTABLE CHEST 1 VIEW COMPARISON:  November 30, 2021. FINDINGS: The heart size and mediastinal contours are within normal limits. Interval development of left perihilar and right infrahilar opacities are noted conc

## 2021-12-26 NOTE — Progress Notes (Signed)
?   12/26/21 1525  ?Clinical Encounter Type  ?Visited With Patient  ?Visit Type Initial;Other (Comment) ?(Advanced Directive)  ?Referral From Nurse  ?Consult/Referral To Chaplain  ? ?Chaplain responded to a spiritual consult request for advanced directive education. The patient, Hulbert, stated that he was tired, so my explanation was as brief as possible. I advised him that if any questions came up as he completed the paperwork to let a nurse know and they would notify [place].  ? ?TrishTaylor ?Chaplain Resident  ?[facility]  ?[phone] ?

## 2021-12-26 NOTE — Progress Notes (Signed)
Initial Nutrition Assessment ? ?DOCUMENTATION CODES:  ?Not applicable ? ?INTERVENTION:  ?-Nepro Shake po BID, each supplement provides 425 kcal and 19 grams protein ?-double protein portions ?-renavite daily ? ?NUTRITION DIAGNOSIS:  ?Increased nutrient needs related to chronic illness (CKD stg IV progressed to ESRD requiring HD) as evidenced by estimated needs. ? ?GOAL:  ?Patient will meet greater than or equal to 90% of their needs ? ?MONITOR:  ?PO intake, Weight trends, Labs, Supplement acceptance ? ?REASON FOR ASSESSMENT:  ?Malnutrition Screening Tool ?  ? ?ASSESSMENT:  ?Pt with PMH significant for anxiety with panic attacks, HTN, and h/o of pneumothorax admitted with CKD stg IV now progressed to ESRD requiring HD and symptomatic anemia. ? ?Pt doing well with meals. 100% meal intake x 1 recorded meal. Pt agreeable to use of ONS. ? ?Weight history reviewed. No significant weight changes noted. ? ?Pt underwent HD today with 2L net UF ?Pre-HD weight 73.3 kg ?No post-HD weight taken ? ?No UOP documented x24 hours ?I/O: -2122ml since admit ? ?Medications: ?Scheduled Meds: ? calcium acetate  667 mg Oral TID WC  ? darbepoetin (ARANESP) injection - DIALYSIS  300 mcg Intravenous Q Wed-HD  ? pantoprazole  40 mg Oral Daily  ?Continuous Infusions: ? sodium chloride    ? [START ON 12/27/2021]  ceFAZolin (ANCEF) IV    ? ferric gluconate (FERRLECIT) IVPB 250 mg (12/26/21 1247)  ? ?Labs: ?Recent Labs  ?Lab 12/24/21 ?1130 12/25/21 ?1939  ?NA 130* 131*  ?K 4.5 4.0  ?CL 99 99  ?CO2 14* 15*  ?BUN 143* 104*  ?CREATININE 20.12* 16.29*  ?CALCIUM 8.8* 8.4*  ?PHOS  --  6.5*  ?GLUCOSE 127* 102*  ?Hgb 5.8 (L) ? ?NUTRITION - FOCUSED PHYSICAL EXAM: ?Flowsheet Row Most Recent Value  ?Orbital Region No depletion  ?Upper Arm Region Moderate depletion  ?Thoracic and Lumbar Region No depletion  ?Buccal Region No depletion  ?Temple Region No depletion  ?Clavicle Bone Region No depletion  ?Scapular Bone Region Mild depletion  ?Dorsal Hand No  depletion  ?Patellar Region Moderate depletion  ?Anterior Thigh Region Moderate depletion  ?Posterior Calf Region Moderate depletion  ?Edema (RD Assessment) None  ?Hair Reviewed  ?Eyes Reviewed  ?Mouth Reviewed  ?Skin Reviewed  ?Nails Reviewed  ? ?Diet Order:   ?Diet Order   ? ?       ?  Diet NPO time specified Except for: Sips with Meds  Diet effective midnight       ?  ?  Diet renal with fluid restriction Fluid restriction: 1200 mL Fluid; Room service appropriate? Yes; Fluid consistency: Thin  Diet effective now       ?  ? ?  ?  ? ?  ? ?EDUCATION NEEDS:  ?No education needs have been identified at this time ? ?Skin:  Skin Assessment: Reviewed RN Assessment ? ?Last BM:  3/26 ? ?Height:  ?Ht Readings from Last 1 Encounters:  ?12/25/21 5\' 9"  (1.753 m)  ? ?Weight:  ?Wt Readings from Last 5 Encounters:  ?12/26/21 73.3 kg  ?12/20/21 73 kg  ?12/14/21 73.3 kg  ?11/30/21 69.9 kg  ?11/12/21 76.7 kg  ? ?BMI:  Body mass index is 23.86 kg/m?. ? ?Estimated Nutritional Needs:  ?Kcal:  2100-2300 ?Protein:  105-115 grams ?Fluid:  1L+UOP ? ? ?Theone Stanley., MS, RD, LDN (she/her/hers) ?RD pager number and weekend/on-call pager number located in [place]. ? ?

## 2021-12-27 ENCOUNTER — Inpatient Hospital Stay (HOSPITAL_COMMUNITY): Payer: Medicaid Other

## 2021-12-27 DIAGNOSIS — N186 End stage renal disease: Secondary | ICD-10-CM | POA: Diagnosis not present

## 2021-12-27 DIAGNOSIS — Z992 Dependence on renal dialysis: Secondary | ICD-10-CM | POA: Diagnosis not present

## 2021-12-27 HISTORY — PX: IR FLUORO GUIDE CV LINE RIGHT: IMG2283

## 2021-12-27 LAB — PROTIME-INR
INR: 1.2 (ref 0.8–1.2)
Prothrombin Time: 14.8 seconds (ref 11.4–15.2)

## 2021-12-27 LAB — CBC
HCT: 16.5 % — ABNORMAL LOW (ref 39.0–52.0)
Hemoglobin: 5.5 g/dL — CL (ref 13.0–17.0)
MCH: 27 pg (ref 26.0–34.0)
MCHC: 33.3 g/dL (ref 30.0–36.0)
MCV: 80.9 fL (ref 80.0–100.0)
Platelets: 123 10*3/uL — ABNORMAL LOW (ref 150–400)
RBC: 2.04 MIL/uL — ABNORMAL LOW (ref 4.22–5.81)
RDW: 19.5 % — ABNORMAL HIGH (ref 11.5–15.5)
WBC: 9.3 10*3/uL (ref 4.0–10.5)
nRBC: 1.3 % — ABNORMAL HIGH (ref 0.0–0.2)

## 2021-12-27 LAB — RENAL FUNCTION PANEL
Albumin: 2.4 g/dL — ABNORMAL LOW (ref 3.5–5.0)
Anion gap: 10 (ref 5–15)
BUN: 69 mg/dL — ABNORMAL HIGH (ref 6–20)
CO2: 25 mmol/L (ref 22–32)
Calcium: 8.6 mg/dL — ABNORMAL LOW (ref 8.9–10.3)
Chloride: 100 mmol/L (ref 98–111)
Creatinine, Ser: 13.22 mg/dL — ABNORMAL HIGH (ref 0.61–1.24)
GFR, Estimated: 4 mL/min — ABNORMAL LOW (ref >60)
Glucose, Bld: 117 mg/dL — ABNORMAL HIGH (ref 70–99)
Phosphorus: 6.2 mg/dL — ABNORMAL HIGH (ref 2.5–4.6)
Potassium: 4.1 mmol/L (ref 3.5–5.1)
Sodium: 135 mmol/L (ref 135–145)

## 2021-12-27 MED ORDER — HYDROMORPHONE HCL 1 MG/ML IJ SOLN
0.2500 mg | Freq: Once | INTRAMUSCULAR | Status: AC
Start: 2021-12-28 — End: 2021-12-27
  Administered 2021-12-27: 0.25 mg via INTRAVENOUS
  Filled 2021-12-27: qty 1

## 2021-12-27 MED ORDER — FENTANYL CITRATE (PF) 100 MCG/2ML IJ SOLN
INTRAMUSCULAR | Status: AC | PRN
  Administered 2021-12-27: 25 ug via INTRAVENOUS

## 2021-12-27 MED ORDER — FENTANYL CITRATE (PF) 100 MCG/2ML IJ SOLN
INTRAMUSCULAR | Status: AC
  Filled 2021-12-27: qty 2

## 2021-12-27 MED ORDER — LIDOCAINE HCL 1 % IJ SOLN
INTRAMUSCULAR | Status: AC
  Filled 2021-12-27: qty 20

## 2021-12-27 MED ORDER — MIDAZOLAM HCL 2 MG/2ML IJ SOLN
INTRAMUSCULAR | Status: AC | PRN
  Administered 2021-12-27: 1 mg via INTRAVENOUS

## 2021-12-27 MED ORDER — HEPARIN SODIUM (PORCINE) 1000 UNIT/ML IJ SOLN
INTRAMUSCULAR | Status: AC
  Filled 2021-12-27: qty 10

## 2021-12-27 MED ORDER — MIDAZOLAM HCL 2 MG/2ML IJ SOLN
INTRAMUSCULAR | Status: AC
  Filled 2021-12-27: qty 2

## 2021-12-27 MED ORDER — LIDOCAINE HCL (PF) 1 % IJ SOLN
INTRAMUSCULAR | Status: AC | PRN
  Administered 2021-12-27: 10 mL

## 2021-12-27 MED ORDER — CEFAZOLIN SODIUM-DEXTROSE 2-4 GM/100ML-% IV SOLN
INTRAVENOUS | Status: AC
  Filled 2021-12-27: qty 100

## 2021-12-27 MED ORDER — HEPARIN SODIUM (PORCINE) 1000 UNIT/ML IJ SOLN
INTRAMUSCULAR | Status: AC | PRN
  Administered 2021-12-27: 3.2 [IU] via INTRAVENOUS

## 2021-12-27 MED ORDER — HEPARIN SODIUM (PORCINE) 1000 UNIT/ML IJ SOLN
INTRAMUSCULAR | Status: AC
  Filled 2021-12-27: qty 4

## 2021-12-27 NOTE — Progress Notes (Signed)
Subjective:  appreciate IR doing the catheter  upgrade-  BP is still high-  due for treatment #3 later today  ? ?Objective ?Vital signs in last 24 hours: ?Vitals:  ? 12/27/21 0915 12/27/21 0920 12/27/21 0938 12/27/21 0944  ?BP: (!) 188/124 (!) 191/123 (!) 181/117 (!) 177/103  ?Pulse: 95 94 88 86  ?Resp: 20 (!) 26 (!) 22 18  ?Temp:    98.8 ?F (37.1 ?C)  ?TempSrc:    Oral  ?SpO2: 93% 93% 93% 91%  ?Weight:      ?Height:      ? ?Weight change: 0 kg ? ?Intake/Output Summary (Last 24 hours) at 12/27/2021 1110 ?Last data filed at 12/27/2021 1829 ?Gross per 24 hour  ?Intake 930.05 ml  ?Output 0 ml  ?Net 930.05 ml  ? ? ?Assessment/Plan: 46 year old BM with progressive CKD over the last 2 mos-  now will need dialysis  ?1.Renal- progressive CKD that has been rapid over the last 2 mos-  thought to be due to HTN but has proteinuria and hematuria so very well could have a GN. However, the horse is clearly out of the barn at this point, he is ESRD.  He agreed to dialysis because he feels terrible-  s/p first HD in the middle of the night 3/27.    second treatment on 3/29 then third treatment today.  It sounds like he is willing to have AVF placed-  not sure they would do with low hgb as complicating factor.  Has a [facility] for now.  Will plan for next HD on Sat , then continue on TTS schedule-  has our of state medicaid so that complicates his OP placement  ?2. Hypertension/volume  - clearly overloaded although interesting not really peripheral edema-  will challenge UF with HD and also gave one time dose of iv lasix to assist-  UOP Not recorded.  CXR has the appearance of PNA and hemoptysis so will be started on abx as well.  Will see how he responds to abx and UF-  CXR not suggestive of pulmonary hemorrhage.   BP Is high on his multidrug regimen ( norvasc 10/clonidine 0.2 and labetalol 300 BID)  once volume status is optimized will hopefully be able to wean ?3. Anemia  - severe- anemia of CKD-   refused transfusion  -  giving high dose  ESA and iron  ?4. Bones-  phos  6.5 -  started phoslo-   check PTH when able  ?  ? ? ? ?Louis Meckel  ? ? ?Labs: ?Basic Metabolic Panel: ?Recent Labs  ?Lab 12/24/21 ?1130 12/25/21 ?1939 12/27/21 ?9371  ?NA 130* 131* 135  ?K 4.5 4.0 4.1  ?CL 99 99 100  ?CO2 14* 15* 25  ?GLUCOSE 127* 102* 117*  ?BUN 143* 104* 69*  ?CREATININE 20.12* 16.29* 13.22*  ?CALCIUM 8.8* 8.4* 8.6*  ?PHOS  --  6.5* 6.2*  ? ?Liver Function Tests: ?Recent Labs  ?Lab 12/24/21 ?1130 12/25/21 ?1939 12/27/21 ?6967  ?AST 21  --   --   ?ALT 13  --   --   ?ALKPHOS 72  --   --   ?BILITOT 0.9  --   --   ?PROT 7.4  --   --   ?ALBUMIN 2.9* 2.6* 2.4*  ? ?No results for input(s): LIPASE, AMYLASE in the last 168 hours. ?No results for input(s): AMMONIA in the last 168 hours. ?CBC: ?Recent Labs  ?Lab 12/24/21 ?1130 12/25/21 ?8938 12/27/21 ?0600  ?WBC 6.6  6.6 9.3  ?NEUTROABS 5.6  --   --   ?HGB 6.2* 5.8* 5.5*  ?HCT 19.4* 17.3* 16.5*  ?MCV 79.5* 78.3* 80.9  ?PLT 80* 83* 123*  ? ?Cardiac Enzymes: ?No results for input(s): CKTOTAL, CKMB, CKMBINDEX, TROPONINI in the last 168 hours. ?CBG: ?No results for input(s): GLUCAP in the last 168 hours. ? ?Iron Studies: No results for input(s): IRON, TIBC, TRANSFERRIN, FERRITIN in the last 72 hours. ?Studies/Results: ?DG Chest 2 View ? ?Result Date: 12/26/2021 ?CLINICAL DATA:  Shortness of breath EXAM: CHEST - 2 VIEW COMPARISON:  12/24/2021 FINDINGS: Right internal jugular line tips at low right atrium. Midline trachea. Normal heart size. Suspect small right pleural effusion. No pneumothorax. Given differences in technique, similar perihilar interstitial and airspace disease bilaterally. IMPRESSION: No significant change in bilateral perihilar interstitial and airspace disease. Given history of end-stage renal disease, pulmonary edema is favored over multifocal infection. Suspect small right pleural effusion, similar. Electronically Signed   By: Abigail Miyamoto M.D.   On: 12/26/2021 12:08  ? ?IR Fluoro Guide CV Line  Right ? ?Result Date: 12/27/2021 ?INDICATION: 46 year old gentleman with renal failure requires conversion of temporary to tunneled hemodialysis catheter. EXAM: Fluoroscopy guided temporary to tunneled hemodialysis catheter conversion MEDICATIONS: Ancef 2 g IV; The antibiotic was administered within an appropriate time interval prior to skin puncture. ANESTHESIA/SEDATION: Moderate (conscious) sedation was employed during this procedure. A total of Versed 1 mg and Fentanyl 25 mcg was administered intravenously by the radiology nurse. Total intra-service moderate Sedation Time: 18 minutes. The patient's level of consciousness and vital signs were monitored continuously by radiology nursing throughout the procedure under my direct supervision. FLUOROSCOPY: Radiation Exposure Index (as provided by the fluoroscopic device): 49 mGy Kerma COMPLICATIONS: None immediate. PROCEDURE: Informed written consent was obtained from the patient after a thorough discussion of the procedural risks, benefits and alternatives. All questions were addressed. Maximal Sterile Barrier Technique was utilized including caps, mask, sterile gowns, sterile gloves, sterile drape, hand hygiene and skin antiseptic. A timeout was performed prior to the initiation of the procedure. Patient positioned supine on the angiography table. External segment of the temporary right IJ hemodialysis catheter, neck and anterior upper chest prepped and draped in the usual sterile fashion. Following local lidocaine administration, the right IJ non-tunneled temporary dialysis catheter was removed over a guidewire. Seriel dilation was performed and peel-away sheath was placed. Attention then turned to the anterior upper chest. Following local lidocaine administration, the hemodialysis catheter was tunneled from the chest wall to the venotomy site. The catheter was inserted through the peel-away sheath. The tip of the catheter was positioned within the right atrium using  fluoroscopic guidance. All lumens of the catheter aspirated and flushed well. The dialysis lumens were locked with Heparin. The catheter was secured to the skin with suture. Pursestring suture applied at the tunnel insertion site. The insertion site was covered with sterile dressing. IMPRESSION: Successful conversion of temporary right IJ hemodialysis catheter to tunneled 19 cm HD catheter. Catheter is ready for use. Electronically Signed   By: Miachel Roux M.D.   On: 12/27/2021 10:34   ?Medications: ?Infusions: ? sodium chloride    ? ferric gluconate (FERRLECIT) IVPB 250 mg (12/27/21 1025)  ? ? ?Scheduled Medications: ? amLODipine  10 mg Oral Daily  ? calcium acetate  667 mg Oral TID WC  ? Chlorhexidine Gluconate Cloth  6 each Topical Q0600  ? cloNIDine  0.2 mg Oral BID  ? darbepoetin (ARANESP) injection - DIALYSIS  300 mcg  Intravenous Q Wed-HD  ? feeding supplement (NEPRO CARB STEADY)  237 mL Oral BID BM  ? heparin sodium (porcine)      ? labetalol  300 mg Oral BID  ? lidocaine      ? multivitamin  1 tablet Oral QHS  ? pantoprazole  40 mg Oral Daily  ? sertraline  25 mg Oral Daily  ? ? have reviewed scheduled and prn medications. ? ?Physical Exam: ?General:  still looking quite fatigued but conversive  ?Heart: RRR ?Lungs:  CBS bilat ?Abdomen: soft, non tender ?Extremities: min pitting edema ?Dialysis Access: right IJ temp cath placed 3/27 -  converted to [facility] on 3/30  ? ? ?12/27/2021,11:10 AM ? LOS: 3 days  ? ?  ? ? ? ? ?

## 2021-12-27 NOTE — Procedures (Signed)
Interventional Radiology Procedure Note ? ?Procedure: Temp to Tunneled HD catheter ? ?Indication: Renal failure ? ?Findings: Please refer to procedural dictation for full description. ? ?Complications: None ? ?EBL: < 10 mL ? ?Miachel Roux, MD ?[phone] ? ? ?

## 2021-12-27 NOTE — Progress Notes (Signed)
Internal Medicine Clinic Attending  Case discussed with Dr. Masters  at the time of the visit.  We reviewed the resident's history and exam and pertinent patient test results.  I agree with the assessment, diagnosis, and plan of care documented in the resident's note.  

## 2021-12-27 NOTE — Progress Notes (Signed)
Met with pt at bedside this am to discuss possible option of TCU at d/c. At this time, pt feels that TCU would not be the best option for him. Pt has concerns about transportation and pt also feels that he would find it physically difficult to go for out-pt HD treatments 4x's a week which TCU requires. Will proceed with 3x's a week in-center request for out-pt treatments. Pt advised that financial staff from [facility] may reach out to him with financial questions. Pt agreeable to speak with them. Will assist as needed.  ? ?Melven Sartorius ?Renal Navigator ?[phone] ?

## 2021-12-27 NOTE — Progress Notes (Signed)
? ?HD#3 ?SUBJECTIVE:  ?Patient Summary: Roy Randolph is a 46 year old male with past medical history of anxiety with panic attacks newly treated with Zoloft, hypertension, new diagnosis of CKD stage IV (January 2023), pneumothorax in his 26s (was a thin smoker at that time) who presented from nephrology office where he was following up for new diagnosis of CKD stage IV where he began to feel severely ill, like he was "going to die."and admitted for CKD stage 4 now progressed to ESRD and symptomatic anemia.  ? ?Overnight Events: None ? ?Interim History: Patient states that he is feeling better this morning overall, with the exception of when he was having a panic attack this morning and felt like he couldn't breathe. This happened around the time of his procedure but he is feeling much more calm now. He states that he is no longer experiencing gum bleeding, epistaxis, or hemoptysis, and that he hasn't noticed any signs of blood in his urine or stool. He is very thankful for the work all teams have put in to getting him feeling better. ? ?OBJECTIVE:  ?Vital Signs: ?Vitals:  ? 12/26/21 1149 12/26/21 1714 12/26/21 2047 12/27/21 0543  ?BP: (!) 151/91 (!) 153/98 (!) 152/107 (!) 166/98  ?Pulse: 76 86 88 80  ?Resp: 17 17 18 18   ?Temp: 97.9 ?F (36.6 ?C) 98.5 ?F (36.9 ?C) 98.1 ?F (36.7 ?C) 98.6 ?F (37 ?C)  ?TempSrc: Oral Oral Oral Oral  ?SpO2: 100% 97% 98% 92%  ?Weight:      ?Height:      ? ?Supplemental O2: Nasal Cannula ?SpO2: 92 % ?O2 Flow Rate (L/min): 1.5 L/min ? ?Filed Weights  ? 12/25/21 1550 12/26/21 0730  ?Weight: 73.3 kg 73.3 kg  ? ? ? ?Intake/Output Summary (Last 24 hours) at 12/27/2021 0743 ?Last data filed at 12/26/2021 2047 ?Gross per 24 hour  ?Intake 964.1 ml  ?Output 2000 ml  ?Net -1035.9 ml  ? ?Net IO Since Admission: -1,549.9 mL [12/27/21 0743] ? ?Physical Exam: ?Constitutional:Chronically ill appearing gentleman, resting comfortably in bed, no acute distress. ?Cardio:Regular rate and rhythm. No  murmurs, rubs, gallops noted. ?Pulm:Bibasilar crackles. No accessory muscle use. Comfortable on [place]. ?Abdomen:Soft, nontender, nondistended. ?DJM:EQASTMHD for extremity edema.  ?Skin:Warm and dry.  ?Neuro:Alert and oriented x3. No focal deficit noted. ?Psych:Pleasant mood and affect. ? ?Patient Lines/Drains/Airways Status   ? ? Active Line/Drains/Airways   ? ? Name Placement date Placement time Site Days  ? Peripheral IV 12/24/21 20 G Anterior;Distal;Right;Upper Arm 12/24/21  --  Arm  3  ? Peripheral IV 12/25/21 20 G Anterior;Left Hand 12/25/21  --  Hand  2  ? Hemodialysis Catheter Right Internal jugular Double lumen Temporary (Non-Tunneled) 12/24/21  1555  Internal jugular  3  ? ?  ?  ? ?  ? ? ? ?ASSESSMENT/PLAN:  ?Assessment: ?Principal Problem: ?  ESRD needing dialysis ([place]) ?Active Problems: ?  Thrombocytopenia ([place]) ?  Essential hypertension ?  Uremia ?  Anemia of chronic kidney failure, stage 5 (HCC) ? ? ?Plan: ?ESRD with need for HD ?Anion gap metabolic acidosis ?Renal function continues to show improvement with HD sessions and his respiratory status improves with these sessions as well. Interventional radiology placed [facility] without complication this morning. ?-Nephrology consulted, appreciate their assistance ?-Plans for additional HD today, 04/02, then starting TTSa schedule ?-Will start ESA and iron ?-Renal navigator on board to help with outpatient dispo concerns ?-Trend renal function ?-Avoid nephrotoxic agents ?  ?Acute hypoxic respiratory failure ?Community acquired pneumonia ?Oxygen requirement  has continued to downtrend since admission, currently only requiring 1.5L [place]. He received 3 total days of antibiotics for CAP, however given suspicion that acute respiratory failure was related to volume overload, a repeat chest x-ray was obtained after second HD session which showed no significant change in bilateral perihilar interstitial and airspace disease, favoring pulmonary edema. With this new information,  antibiotics were discontinued. Blood cultures continue to show NGTD. He does have intermittent increases in supplemental oxygen requirements however it seems to coincide with panic attacks, with resolution to lower supplementation needs as his anxiety resolves. ?-Follow blood culture ?-Maintain O2 supplementation with SpO2 goal greater than 93% ?  ?Microcytic anemia ?Patient will receive ESA and iron with HD. CBC shows slowly downtrending Hgb, most recently 5.5 this morning (6.2 on admission)His most recent CBC morning of 03/28 revealed Hgb of 5.8, decreased from 6.2 on admission. PT/INR within normal limits. 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 He denies any signs of bleeding in his stool or urine and reports that epistaxis, gum bleeding, and hemoptysis have not occurred since yesterday. ?-Nephrology consulted, appreciate their assistance ?-Will start patient on ESA and iron ?-Trend CBC ?  ?Hypertension ?BP is overall downtrending, largely in SBP 150s-160s range, though still elevated despite anti-hypertensive therapy and HD. ?-Continue home amlodipine 10 mg daily, clonidine 0.2 mg twice daily, labetalol 30 mg twice daily ?-CTM ?  ?Hx anxiety with panic attacks ?Patient reports increased frequency of panic attacks recently. He was started on Zoloft last week. ?-Continue Zoloft 25 mg daily ? ?Best Practice: ?Diet: Renal diet ?IVF: None ?VTE: Place and maintain sequential compression device Start: 12/25/21 0714 ?Code: Full ?AB: None ?Family Contact: Tawana, called and notified. ?DISPO: Anticipated discharge in 2-3 days to Home pending Medical stability. ? ?Signature: ?Farrel Gordon, D.O.  ?Internal Medicine Resident, PGY-1 ?[facility]  ?Pager: [phone] ?7:43 AM, 12/27/2021  ? ?Please contact the on call pager after 5 pm and on weekends at [phone]. ? ?

## 2021-12-27 NOTE — Evaluation (Signed)
Physical Therapy Evaluation ?Patient Details ?Name: Roy Randolph ?MRN: [id number] ?DOB: 1976/01/09 ?Today's Date: 12/27/2021 ? ?History of Present Illness ? 46 y/o male presented to ED on 12/24/21 from [facility] d/t HTN and coughing up blood x 5 days. Admitted for ESRD with need for HD. CXR conered for pneumonia in L and R lobe. PMH: CKD stage IV, HTN  ?Clinical Impression ? Patient admitted with above findings. Patient presents with generalized weakness and decreased activity tolerance. On arrival, patient with spO2 75% with increased WOB and observed by this therapist that oxygen tubing was connected to medical air, notified RN and NT. Switched over to O2 on 5L with increase to 90%. SpO2 improved to 99% on 5L O2 [place] with activity so reduced to 4L with patient able to maintain 97%. Patient deferred further mobility than standing and marching at EOB due to fatigue. Patient is overall at supervision level at this time. Patient was independent with no AD PTA. Patient will benefit from skilled PT services during acute stay to address listed deficits. No PT follow up recommended at this time, however patient may benefit from cardiac/pulmonary rehab.    ?   ? ?Recommendations for follow up therapy are one component of a multi-disciplinary discharge planning process, led by the attending physician.  Recommendations may be updated based on patient status, additional functional criteria and insurance authorization. ? ?Follow Up Recommendations No PT follow up (would benefit from cardiac/pulmonary rehab) ? ?  ?Assistance Recommended at Discharge PRN  ?Patient can return home with the following ?   ? ?  ?Equipment Recommendations None recommended by PT  ?Recommendations for Other Services ?    ?  ?Functional Status Assessment Patient has had a recent decline in their functional status and demonstrates the ability to make significant improvements in function in a reasonable and predictable amount of time.  ? ?   ?Precautions / Restrictions Precautions ?Precautions: Fall ?Precaution Comments: watch O2 ?Restrictions ?Weight Bearing Restrictions: No  ? ?  ? ?Mobility ? Bed Mobility ?Overal bed mobility: Needs Assistance ?Bed Mobility: Supine to Sit, Sit to Supine ?  ?  ?Supine to sit: Supervision ?Sit to supine: Supervision ?  ?  ?  ? ?Transfers ?Overall transfer level: Needs assistance ?Equipment used: None ?Transfers: Sit to/from Stand ?Sit to Stand: Supervision ?  ?  ?  ?  ?  ?General transfer comment: supervision for safety ?  ? ?Ambulation/Gait ?  ?  ?  ?  ?  ?  ?Pre-gait activities: standing marching, ambulating fwd/bwd from EOB. Patient declined further mobility due to feeling SOB despite spO2 >97% on 5L O2 ?  ? ?Stairs ?  ?  ?  ?  ?  ? ?Wheelchair Mobility ?  ? ?Modified Rankin (Stroke Patients Only) ?  ? ?  ? ?Balance Overall balance assessment: Mild deficits observed, not formally tested ?  ?  ?  ?  ?  ?  ?  ?  ?  ?  ?  ?  ?  ?  ?  ?  ?  ?  ?   ? ? ? ?Pertinent Vitals/Pain Pain Assessment ?Pain Assessment: No/denies pain  ? ? ?Home Living Family/patient expects to be discharged to:: Private residence ?Living Arrangements: Other relatives ?Available Help at Discharge: Family ?Type of Home: House ?Home Access: Stairs to enter ?Entrance Stairs-Rails: Left ?Entrance Stairs-Number of Steps: 6 ?Alternate Level Stairs-Number of Steps: flight ?Home Layout: Two level ?Home Equipment: None ?   ?  ?Prior  Function Prior Level of Function : Independent/Modified Independent ?  ?  ?  ?  ?  ?  ?Mobility Comments: recently stopped driving due to recent hospital admission for loss of vision due to HTN ?  ?  ? ? ?Hand Dominance  ?   ? ?  ?Extremity/Trunk Assessment  ? Upper Extremity Assessment ?Upper Extremity Assessment: Overall WFL for tasks assessed ?  ? ?Lower Extremity Assessment ?Lower Extremity Assessment: Generalized weakness ?  ? ?Cervical / Trunk Assessment ?Cervical / Trunk Assessment: Normal  ?Communication  ?  Communication: No difficulties  ?Cognition Arousal/Alertness: Awake/alert ?Behavior During Therapy: [facility] for tasks assessed/performed ?Overall Cognitive Status: Within Functional Limits for tasks assessed ?  ?  ?  ?  ?  ?  ?  ?  ?  ?  ?  ?  ?  ?  ?  ?  ?General Comments: very aware of deficits and good safety awareness. Chose to stop driving due to visual deficits and wanting to keep himself and everyone on the road safe ?  ?  ? ?  ?General Comments General comments (skin integrity, edema, etc.): On arrival, patient with spO2 75% and notable increased work of breathing at rest in supine and use of accessory muscles. This therapist noticed patient's oxygen tubing connected to medical air, notified RN and NT. Switched over to 5L O2 [place] to increase spO2 90% with improved breathing patterns. decreased to 4L O2  at end of session due to spO2 99% on 5L O2 with activity. At end of session, spO2 97% ? ?  ?Exercises    ? ?Assessment/Plan  ?  ?PT Assessment Patient needs continued PT services  ?PT Problem List Decreased strength;Decreased activity tolerance;Decreased balance;Cardiopulmonary status limiting activity ? ?   ?  ?PT Treatment Interventions DME instruction;Gait training;Functional mobility training;Stair training;Therapeutic exercise;Therapeutic activities;Balance training;Patient/family education   ? ?PT Goals (Current goals can be found in the Care Plan section)  ?Acute Rehab PT Goals ?Patient Stated Goal: to feel better ?PT Goal Formulation: With patient ?Time For Goal Achievement: 01/10/22 ?Potential to Achieve Goals: Good ? ?  ?Frequency Min 3X/week ?  ? ? ?Co-evaluation   ?  ?  ?  ?  ? ? ?  ?AM-PAC PT "6 Clicks" Mobility  ?Outcome Measure Help needed turning from your back to your side while in a flat bed without using bedrails?: A Little ?Help needed moving from lying on your back to sitting on the side of a flat bed without using bedrails?: A Little ?Help needed moving to and from a bed to a chair  (including a wheelchair)?: A Little ?Help needed standing up from a chair using your arms (e.g., wheelchair or bedside chair)?: A Little ?Help needed to walk in hospital room?: A Little ?Help needed climbing 3-5 steps with a railing? : A Little ?6 Click Score: 18 ? ?  ?End of Session Equipment Utilized During Treatment: Oxygen ?Activity Tolerance: Patient tolerated treatment well ?Patient left: in bed;with call bell/phone within reach ?Nurse Communication: Mobility status;Other (comment) (notified RN and NT about oxygen tubing hooked to medical air) ?PT Visit Diagnosis: Unsteadiness on feet (R26.81);Muscle weakness (generalized) (M62.81) ?  ? ?Time: 9833-8250 ?PT Time Calculation (min) (ACUTE ONLY): 15 min ? ? ?Charges:   PT Evaluation ?$PT Eval Moderate Complexity: 1 Mod ?  ?  ?   ? ? ?Jeffie Spivack A. Gilford Rile, PT, DPT ?Acute Rehabilitation Services ?Pager [phone] ?Office [phone] ? ? ?Aranza Geddes A [place] ?12/27/2021, 10:16 AM ? ?

## 2021-12-28 ENCOUNTER — Encounter (HOSPITAL_COMMUNITY): Payer: Medicaid Other

## 2021-12-28 DIAGNOSIS — N186 End stage renal disease: Secondary | ICD-10-CM | POA: Diagnosis not present

## 2021-12-28 DIAGNOSIS — Z992 Dependence on renal dialysis: Secondary | ICD-10-CM | POA: Diagnosis not present

## 2021-12-28 LAB — TYPE AND SCREEN
ABO/RH(D): O POS
Antibody Screen: NEGATIVE
Unit division: 0

## 2021-12-28 LAB — BPAM RBC
Blood Product Expiration Date: 202304252359
ISSUE DATE / TIME: 202303270742
Unit Type and Rh: 5100

## 2021-12-28 LAB — PARATHYROID HORMONE, INTACT (NO CA): PTH: 236 pg/mL — ABNORMAL HIGH (ref 15–65)

## 2021-12-28 MED ORDER — CHLORHEXIDINE GLUCONATE CLOTH 2 % EX PADS
6.0000 | MEDICATED_PAD | Freq: Every day | CUTANEOUS | Status: DC
  Administered 2021-12-29 – 2022-01-02 (×5): 6 via TOPICAL

## 2021-12-28 MED ORDER — LABETALOL HCL 200 MG PO TABS
400.0000 mg | ORAL_TABLET | Freq: Two times a day (BID) | ORAL | Status: DC
  Administered 2021-12-28 – 2022-01-02 (×8): 400 mg via ORAL
  Filled 2021-12-28 (×8): qty 2

## 2021-12-28 MED ORDER — ALPRAZOLAM 0.5 MG PO TABS
0.5000 mg | ORAL_TABLET | Freq: Three times a day (TID) | ORAL | Status: DC | PRN
  Administered 2021-12-28: 0.5 mg via ORAL
  Filled 2021-12-28 (×2): qty 1

## 2021-12-28 NOTE — Progress Notes (Addendum)
? ?HD#4 ?SUBJECTIVE:  ?Patient Summary: Roy Randolph is a 46 year old male with past medical history of anxiety with panic attacks newly treated with Zoloft, hypertension, new diagnosis of CKD stage IV (January 2023), pneumothorax in his 40s (was a thin smoker at that time) who presented from nephrology office where he was following up for new diagnosis of CKD stage IV where he began to feel severely ill, like he was "going to die."and admitted for CKD stage 4 now progressed to ESRD and symptomatic anemia. ? ?Overnight Events: None ? ?Interim History: Patient reports continuing to feel better today, in fact was tolerating room air comfortably, only needing it when he feels like he is highly anxious or going into panic. He did cough up a dark red clot but no other bleeding reported. No BM today but he has not seen signs of bleeding. He enjoys the SCDs. He did have concerns regarding seeing some scribbling on the wall, similar to the flashing lights expressed previously in this admission, and for that reason he has not been taking Zoloft as he was worried it was a medication side effect. ? ?OBJECTIVE:  ?Vital Signs: ?Vitals:  ? 12/27/21 1849 12/27/21 1856 12/27/21 2017 12/28/21 0448  ?BP:   (!) 173/97 (!) 154/103  ?Pulse:   84 79  ?Resp:   18 18  ?Temp:   97.7 ?F (36.5 ?C) 98 ?F (36.7 ?C)  ?TempSrc:   Oral Oral  ?SpO2: 96% 96% 99% 97%  ?Weight:      ?Height:      ? ?Supplemental O2: Room Air ?SpO2: 97 % ?O2 Flow Rate (L/min): 2 L/min ? ?Filed Weights  ? 12/26/21 0730 12/27/21 1430 12/27/21 1810  ?Weight: 73.3 kg 76.3 kg 75.3 kg  ? ? ? ?Intake/Output Summary (Last 24 hours) at 12/28/2021 0724 ?Last data filed at 12/28/2021 9937 ?Gross per 24 hour  ?Intake 1030 ml  ?Output 2200 ml  ?Net -1170 ml  ? ?Net IO Since Admission: -2,719.9 mL [12/28/21 0724] ? ?Physical Exam: ?Constitutional:Chronically-ill appearing gentleman resting comfortably in bed, no acute distress. ?HENT:No bleeding or dried clots noted in  oropharynx. Mucosa is pale but unchanged. ?Cardio:Regular rate and rhythm. No murmurs, rubs, gallops. ?Pulm:Bibasilar crackles. Normal work of breathing on room air. ?Abdomen:Soft, nontender, nondistended. ?JIR:CVELFYBO for extremity edema. ?Skin:Warm and dry. ?Neuro:Alert and oriented x3, no focal deficit noted. ?Psych:Pleasant mood and affect. ? ?Patient Lines/Drains/Airways Status   ? ? Active Line/Drains/Airways   ? ? Name Placement date Placement time Site Days  ? Peripheral IV 12/24/21 20 G Anterior;Distal;Right;Upper Arm 12/24/21  --  Arm  4  ? Hemodialysis Catheter Right Internal jugular Double lumen Permanent (Tunneled) 12/27/21  0917  Internal jugular  1  ? ?  ?  ? ?  ? ? ? ?ASSESSMENT/PLAN:  ?Assessment: ?Principal Problem: ?  ESRD needing dialysis ([place]) ?Active Problems: ?  Thrombocytopenia ([place]) ?  Essential hypertension ?  Uremia ?  Anemia of chronic kidney failure, stage 5 (HCC) ? ? ?Plan: ?ESRD with need for HD ?Anion gap metabolic acidosis ?Patient has undergone three HD sessions thus far with continued improvement of renal function labs, breathing, and mucosal bleeding. ?-Nephrology consulted, appreciate their assistance ?-Next HD session 04/01, then starting TTSa schedule ?-Continue ESA and iron ?-Renal navigator on board to help with outpatient dispo concerns ?-Trend renal function ?-Avoid nephrotoxic agents ?  ?Acute hypoxic respiratory failure ?Community acquired pneumonia ?Oxygen requirement has continued to downtrend since admission, currently on room air with no complaints of  shortness of breath or chest pain.  Blood cultures continue to show NGTD.  ?-Follow blood culture ?-Maintain O2 supplementation with SpO2 goal greater than 93% ?  ?Microcytic anemia ?Patient is receiving ESA and iron with HD.  He denies any signs of bleeding in his stool or urine and reports that epistaxis, gum bleeding have not recurred in several days, and hemoptysis occurred once yesterday evening and was a dark red  clot. ?-Nephrology consulted, appreciate their assistance ?-Continue ESA and iron ?-Trend CBC ?  ?Hypertension ?BP is overall downtrending, largely in SBP 150s-160s range, though still elevated despite anti-hypertensive therapy and HD. ?-Continue home amlodipine 10 mg daily, clonidine 0.2 mg twice daily, labetalol 400 mg twice daily ?-CTM ?  ?Hx anxiety with panic attacks ?Patient reports increased frequency of panic attacks recently. He was started on Zoloft last week however this morning he expressed concerns regarding seeing some scribbling on the wall, similar to the flashing lights expressed previously in this admission, and for that reason he has not been taking Zoloft as he was worried it was a medication side effect. I counseled him that I felt that these symptoms were likely related to elevated blood pressure rather than Zoloft. He is agreeable to start taking this medication again as prescribed to tame his anxiety and will let our team know if he begins having these vision changes again when his blood pressure is at a more normal level. ?-Continue Zoloft 25 mg daily ?-Alprazolam 0.5 mg TID PRN panic attacks ? ?Best Practice: ?Diet: Renal diet ?IVF: None ?VTE: Place and maintain sequential compression device Start: 12/25/21 0714 ?Code: Full ?AB: None ?Therapy Recs: None ?Family Contact: Tawana, called and notified. ?DISPO: Anticipated discharge in 1-2 days to Home pending  outpatient HD bed and medical stability . ? ?Signature: ?Farrel Gordon, D.O.  ?Internal Medicine Resident, PGY-1 ?[facility]  ?Pager: [phone] ?7:24 AM, 12/28/2021  ? ?Please contact the on call pager after 5 pm and on weekends at [phone]. ? ?

## 2021-12-28 NOTE — Progress Notes (Addendum)
Contacted [facility] admissions to request update on pt's case. Awaiting response.  ? ? ?Tracy Mounce ?Renal Navigator ?[phone] ? ?Addendum at 2:03 pm: ?Pt has not received financial clearance to begin at out-pt HD clinic. Will continue to await final approval for services at d/c.  ? ? ?Addendum at 4:30 pm: ?Pt approved for [facility] on TTS schedule. Pt can start on Tuesday. Pt will need to arrive on Tuesday at 9:30 to complete paperwork prior to 10:30 chair time. Met with pt at bedside to provide the above details. Schedule letter provided as well. Pt voiced understanding and agreeable to plan. Pt was not very talkative. Update provided to nephrologist, renal NP,and RN CM. Also spoke to [facility] at [facility] and left message for Shawn, clinic manager, regarding pt receiving financial clearance. Will add out-pt HD arrangements to AVS as well. Will assist as needed.  ?

## 2021-12-28 NOTE — Progress Notes (Signed)
Physical Therapy Treatment ?Patient Details ?Name: Roy Randolph ?MRN: [id number] ?DOB: 01-23-1976 ?Today's Date: 12/28/2021 ? ? ?History of Present Illness 46 y/o male presented to ED on 12/24/21 from [facility] d/t HTN and coughing up blood x 5 days. Admitted for ESRD with need for HD. CXR conered for pneumonia in L and R lobe. PMH: CKD stage IV, HTN ? ?  ?PT Comments  ? ? Patient progressing towards physical therapy goals. Patient ambulating in hallway on RA with supervision with drop in spO2 to 86% at end of walk. With seated rest break and instruction of pursed lip breathing, he was able to increase to 92% within 30 seconds. No PT follow up recommended at this time but he would benefit from cardiac/pulmonary rehab at discharge.  ?   ?Recommendations for follow up therapy are one component of a multi-disciplinary discharge planning process, led by the attending physician.  Recommendations may be updated based on patient status, additional functional criteria and insurance authorization. ? ?Follow Up Recommendations ? No PT follow up (would benefit from cardiac/pulmonary rehab) ?  ?  ?Assistance Recommended at Discharge PRN  ?Patient can return home with the following   ?  ?Equipment Recommendations ? None recommended by PT  ?  ?Recommendations for Other Services   ? ? ?  ?Precautions / Restrictions Precautions ?Precautions: Fall ?Precaution Comments: watch O2 ?Restrictions ?Weight Bearing Restrictions: No  ?  ? ?Mobility ? Bed Mobility ?Overal bed mobility: Modified Independent ?  ?  ?  ?  ?  ?  ?  ?  ? ?Transfers ?Overall transfer level: Needs assistance ?Equipment used: None ?Transfers: Sit to/from Stand ?Sit to Stand: Supervision ?  ?  ?  ?  ?  ?  ?  ? ?Ambulation/Gait ?Ambulation/Gait assistance: Supervision ?Gait Distance (Feet): 200 Feet ?Assistive device: None ?Gait Pattern/deviations: Step-through pattern, Decreased stride length ?Gait velocity: decreased ?  ?  ?General Gait Details: supervision  for safety. SpO2 drop to 86% at end of ambulation but increased to 92% after 30 seconds of seated rest break and instruction of pursed lip breathing ? ? ?Stairs ?  ?  ?  ?  ?  ? ? ?Wheelchair Mobility ?  ? ?Modified Rankin (Stroke Patients Only) ?  ? ? ?  ?Balance Overall balance assessment: Mild deficits observed, not formally tested ?  ?  ?  ?  ?  ?  ?  ?  ?  ?  ?  ?  ?  ?  ?  ?  ?  ?  ?  ? ?  ?Cognition Arousal/Alertness: Awake/alert ?Behavior During Therapy: [facility] for tasks assessed/performed ?Overall Cognitive Status: Within Functional Limits for tasks assessed ?  ?  ?  ?  ?  ?  ?  ?  ?  ?  ?  ?  ?  ?  ?  ?  ?  ?  ?  ? ?  ?Exercises   ? ?  ?General Comments   ?  ?  ? ?Pertinent Vitals/Pain Pain Assessment ?Pain Assessment: No/denies pain  ? ? ?Home Living   ?  ?  ?  ?  ?  ?  ?  ?  ?  ?   ?  ?Prior Function    ?  ?  ?   ? ?PT Goals (current goals can now be found in the care plan section) Acute Rehab PT Goals ?Patient Stated Goal: to feel better ?PT Goal Formulation: With patient ?Time For Goal Achievement:  01/10/22 ?Potential to Achieve Goals: Good ?Progress towards PT goals: Progressing toward goals ? ?  ?Frequency ? ? ? Min 3X/week ? ? ? ?  ?PT Plan Current plan remains appropriate  ? ? ?Co-evaluation   ?  ?  ?  ?  ? ?  ?AM-PAC PT "6 Clicks" Mobility   ?Outcome Measure ? Help needed turning from your back to your side while in a flat bed without using bedrails?: None ?Help needed moving from lying on your back to sitting on the side of a flat bed without using bedrails?: None ?Help needed moving to and from a bed to a chair (including a wheelchair)?: A Little ?Help needed standing up from a chair using your arms (e.g., wheelchair or bedside chair)?: A Little ?Help needed to walk in hospital room?: A Little ?Help needed climbing 3-5 steps with a railing? : A Little ?6 Click Score: 20 ? ?  ?End of Session   ?Activity Tolerance: Patient tolerated treatment well ?Patient left: in bed;with call bell/phone within  reach ?Nurse Communication: Mobility status ?PT Visit Diagnosis: Unsteadiness on feet (R26.81);Muscle weakness (generalized) (M62.81) ?  ? ? ?Time: 7209-4709 ?PT Time Calculation (min) (ACUTE ONLY): 16 min ? ?Charges:  $Gait Training: 8-22 mins          ?          ? ?Marteze Vecchio A. Gilford Rile, PT, DPT ?Acute Rehabilitation Services ?Pager [phone] ?Office [phone] ? ? ? ?Silvia Markuson A Milani Lowenstein ?12/28/2021, 11:52 AM ? ?

## 2021-12-28 NOTE — Progress Notes (Signed)
Subjective:  s/p HD #3 yesterday -  able to get BFR 300 with new cath- BP down some  ? ?Objective ?Vital signs in last 24 hours: ?Vitals:  ? 12/27/21 1856 12/27/21 2017 12/28/21 0448 12/28/21 0827  ?BP:  (!) 173/97 (!) 154/103 (!) 148/102  ?Pulse:  84 79 79  ?Resp:  18 18 19   ?Temp:  97.7 ?F (36.5 ?C) 98 ?F (36.7 ?C) 98.6 ?F (37 ?C)  ?TempSrc:  Oral Oral   ?SpO2: 96% 99% 97% 100%  ?Weight:      ?Height:      ? ?Weight change: 3 kg ? ?Intake/Output Summary (Last 24 hours) at 12/28/2021 1226 ?Last data filed at 12/28/2021 3244 ?Gross per 24 hour  ?Intake 930 ml  ?Output 2200 ml  ?Net -1270 ml  ? ? ?Assessment/Plan: 46 year old BM with progressive CKD over the last 2 mos-  now will need dialysis  ?1.Renal- progressive CKD that has been rapid over the last 2 mos-  thought to be due to HTN but has proteinuria and hematuria so very well could have a GN. However, the horse is clearly out of the barn at this point, he is ESRD.  He agreed to dialysis because he feels terrible-  s/p first HD in the middle of the night 3/27.    second treatment on 3/29 then third treatment 3/30.  It sounds like he is willing to have AVF placed-  not sure they would do with low hgb as complicating factor.  Has a [facility] for now.  Will plan for next HD on Sat , then continue on TTS schedule-  has out of state medicaid so that complicates his OP placement  ?2. Hypertension/volume  - clearly overloaded although interesting not really peripheral edema-  will challenge UF with HD and also gave one time dose of iv lasix to assist-  UOP Not recorded.  CXR has the appearance of PNA and hemoptysis so will be started on abx as well.    BP Is high on his multidrug regimen ( norvasc 10/clonidine 0.2 BID and labetalol 300 BID)  once volume status is optimized will hopefully be able to wean-  still high-  will inc his labetalol to 400 BID  ?3. Anemia  - severe- anemia of CKD-   refused transfusion  -  giving high dose ESA and iron  ?4. Bones-  phos  6.5 -  started  phoslo-   PTH pending ?  ? ? ? ?Louis Meckel  ? ? ?Labs: ?Basic Metabolic Panel: ?Recent Labs  ?Lab 12/24/21 ?1130 12/25/21 ?1939 12/27/21 ?0102  ?NA 130* 131* 135  ?K 4.5 4.0 4.1  ?CL 99 99 100  ?CO2 14* 15* 25  ?GLUCOSE 127* 102* 117*  ?BUN 143* 104* 69*  ?CREATININE 20.12* 16.29* 13.22*  ?CALCIUM 8.8* 8.4* 8.6*  ?PHOS  --  6.5* 6.2*  ? ?Liver Function Tests: ?Recent Labs  ?Lab 12/24/21 ?1130 12/25/21 ?1939 12/27/21 ?7253  ?AST 21  --   --   ?ALT 13  --   --   ?ALKPHOS 72  --   --   ?BILITOT 0.9  --   --   ?PROT 7.4  --   --   ?ALBUMIN 2.9* 2.6* 2.4*  ? ?No results for input(s): LIPASE, AMYLASE in the last 168 hours. ?No results for input(s): AMMONIA in the last 168 hours. ?CBC: ?Recent Labs  ?Lab 12/24/21 ?1130 12/25/21 ?6644 12/27/21 ?0600  ?WBC 6.6 6.6 9.3  ?NEUTROABS 5.6  --   --   ?  HGB 6.2* 5.8* 5.5*  ?HCT 19.4* 17.3* 16.5*  ?MCV 79.5* 78.3* 80.9  ?PLT 80* 83* 123*  ? ?Cardiac Enzymes: ?No results for input(s): CKTOTAL, CKMB, CKMBINDEX, TROPONINI in the last 168 hours. ?CBG: ?No results for input(s): GLUCAP in the last 168 hours. ? ?Iron Studies: No results for input(s): IRON, TIBC, TRANSFERRIN, FERRITIN in the last 72 hours. ?Studies/Results: ?IR Fluoro Guide CV Line Right ? ?Result Date: 12/27/2021 ?INDICATION: 46 year old gentleman with renal failure requires conversion of temporary to tunneled hemodialysis catheter. EXAM: Fluoroscopy guided temporary to tunneled hemodialysis catheter conversion MEDICATIONS: Ancef 2 g IV; The antibiotic was administered within an appropriate time interval prior to skin puncture. ANESTHESIA/SEDATION: Moderate (conscious) sedation was employed during this procedure. A total of Versed 1 mg and Fentanyl 25 mcg was administered intravenously by the radiology nurse. Total intra-service moderate Sedation Time: 18 minutes. The patient's level of consciousness and vital signs were monitored continuously by radiology nursing throughout the procedure under my direct  supervision. FLUOROSCOPY: Radiation Exposure Index (as provided by the fluoroscopic device): 49 mGy Kerma COMPLICATIONS: None immediate. PROCEDURE: Informed written consent was obtained from the patient after a thorough discussion of the procedural risks, benefits and alternatives. All questions were addressed. Maximal Sterile Barrier Technique was utilized including caps, mask, sterile gowns, sterile gloves, sterile drape, hand hygiene and skin antiseptic. A timeout was performed prior to the initiation of the procedure. Patient positioned supine on the angiography table. External segment of the temporary right IJ hemodialysis catheter, neck and anterior upper chest prepped and draped in the usual sterile fashion. Following local lidocaine administration, the right IJ non-tunneled temporary dialysis catheter was removed over a guidewire. Seriel dilation was performed and peel-away sheath was placed. Attention then turned to the anterior upper chest. Following local lidocaine administration, the hemodialysis catheter was tunneled from the chest wall to the venotomy site. The catheter was inserted through the peel-away sheath. The tip of the catheter was positioned within the right atrium using fluoroscopic guidance. All lumens of the catheter aspirated and flushed well. The dialysis lumens were locked with Heparin. The catheter was secured to the skin with suture. Pursestring suture applied at the tunnel insertion site. The insertion site was covered with sterile dressing. IMPRESSION: Successful conversion of temporary right IJ hemodialysis catheter to tunneled 19 cm HD catheter. Catheter is ready for use. Electronically Signed   By: Miachel Roux M.D.   On: 12/27/2021 10:34   ?Medications: ?Infusions: ? sodium chloride    ? ? ?Scheduled Medications: ? amLODipine  10 mg Oral Daily  ? calcium acetate  667 mg Oral TID WC  ? Chlorhexidine Gluconate Cloth  6 each Topical Q0600  ? cloNIDine  0.2 mg Oral BID  ? darbepoetin  (ARANESP) injection - DIALYSIS  300 mcg Intravenous Q Wed-HD  ? feeding supplement (NEPRO CARB STEADY)  237 mL Oral BID BM  ? labetalol  300 mg Oral BID  ? multivitamin  1 tablet Oral QHS  ? pantoprazole  40 mg Oral Daily  ? sertraline  25 mg Oral Daily  ? ? have reviewed scheduled and prn medications. ? ?Physical Exam: ?General:  looks better  ?Heart: RRR ?Lungs:  CBS bilat ?Abdomen: soft, non tender ?Extremities: min pitting edema ?Dialysis Access: right IJ temp cath placed 3/27 -  converted to [facility] on 3/30  ? ? ?12/28/2021,12:26 PM ? LOS: 4 days  ? ?  ? ? ? ? ?

## 2021-12-28 NOTE — TOC Initial Note (Signed)
Transition of Care (TOC) - Initial/Assessment Note  ? ? ?Patient Details  ?Name: Roy Randolph ?MRN: [id number] ?Date of Birth: [birth year] ? ?Transition of Care (TOC) CM/SW Contact:    ?Tom-Johnson, Renea Ee, RN ?Phone Number: ?12/28/2021, 1:29 PM ? ?Clinical Narrative:                 ? ?CM spoke with patient at bedside about needs for post hospital transition. From home with his sister. His mother will be coming from [place] to be with him. Admitted for ESRD. Patient is starting dialysis and is clipped at [facility] on [facility]. Financial clearance pending at this time due to patient's insurance. Has Medicaid from [place] and not acceptable in . States he went to [employer] office and did the paperwork for [facility] Medicaid prior to admission and it takes about 90 days to get a response.  ?PCP is with Internal Medicine at [facility] and uses [place].  ?NO PT f/u noted. Family to transport at discharge.  ?CM will continue to follow with needs. ? ? ? ? ?Expected Discharge Plan: Home/Self Care ?Barriers to Discharge: Continued Medical Work up ? ? ?Patient Goals and CMS Choice ?Patient states their goals for this hospitalization and ongoing recovery are:: To return home ?CMS Medicare.gov Compare Post Acute Care list provided to:: Patient ?Choice offered to / list presented to : NA ? ?Expected Discharge Plan and Services ?Expected Discharge Plan: Home/Self Care ?  ?Discharge Planning Services: CM Consult ?Post Acute Care Choice: NA ?Living arrangements for the past 2 months: [place] ?                ?DME Arranged: N/A ?DME Agency: NA ?  ?  ?  ?HH Arranged: NA ?[place] Agency: NA ?  ?  ?  ? ?Prior Living Arrangements/Services ?Living arrangements for the past 2 months: [place] ?Lives with:: Siblings (SIster) ?Patient language and need for interpreter reviewed:: Yes ?Do you feel safe going back to the place where you live?: Yes      ?Need for Family  Participation in Patient Care: Yes (Comment) ?Care giver support system in place?: Yes (comment) ?  ?Criminal Activity/Legal Involvement Pertinent to Current Situation/Hospitalization: No - Comment as needed ? ?Activities of Daily Living ?Home Assistive Devices/Equipment: None ?ADL Screening (condition at time of admission) ?Patient's cognitive ability adequate to safely complete daily activities?: Yes ?Is the patient deaf or have difficulty hearing?: No ?Does the patient have difficulty seeing, even when wearing glasses/contacts?: Yes ?Does the patient have difficulty concentrating, remembering, or making decisions?: No ?Patient able to express need for assistance with ADLs?: No ?Does the patient have difficulty dressing or bathing?: No ?Independently performs ADLs?: Yes (appropriate for developmental age) ?Does the patient have difficulty walking or climbing stairs?: Yes ?Weakness of Legs: None ?Weakness of Arms/Hands: None ? ?Permission Sought/Granted ?Permission sought to share information with : Case Manager, Customer service manager, Family Supports ?Permission granted to share information with : Yes, Verbal Permission Granted ?   ?   ?   ?   ? ?Emotional Assessment ?Appearance:: Appears stated age ?Attitude/Demeanor/Rapport: Engaged, Gracious ?Affect (typically observed): Accepting, Appropriate, Calm, Hopeful ?Orientation: : Oriented to Self, Oriented to Place, Oriented to  Time, Oriented to Situation ?Alcohol / Substance Use: Not Applicable ?Psych Involvement: No (comment) ? ?Admission diagnosis:  End stage renal disease ([place]) [N18.6] ?ESRD needing dialysis ([place]) [N18.6, Z99.2] ?Hemoptysis [R04.2] ?Anemia, unspecified type [D64.9] ?Hypertension, unspecified type [  I10] ?Hematemesis, unspecified whether nausea present [K92.0] ?Patient Active Problem List  ? Diagnosis Date Noted  ? Uremia 12/25/2021  ? Anemia of chronic kidney failure, stage 5 ([place]) 12/25/2021  ? ESRD needing dialysis ([place]) 12/24/2021  ?  Xerostomia 12/21/2021  ? End stage kidney disease ([place]) 12/14/2021  ? Anxiety 12/14/2021  ? Former smoker 11/14/2021  ? Gastritis without bleeding 11/14/2021  ? Essential hypertension 11/14/2021  ? Normocytic anemia 10/10/2021  ? Thrombocytopenia ([place]) 10/10/2021  ? Migraine 10/10/2021  ? ?PCP:  Christiana Fuchs, DO ?Pharmacy:   ?[facility] ?[address] ?[place] [address] ?Phone: [phone] Fax: [phone] ? ?[facility] ?[address] ?[place] [address] ?Phone: [phone] Fax: [phone] ? ? ? ? ?Social Determinants of Health (SDOH) Interventions ?  ? ?Readmission Risk Interventions ? ?  12/28/2021  ?  1:23 PM  ?Readmission Risk Prevention Plan  ?Transportation Screening Complete  ?PCP or Specialist Appt within 5-7 Days Complete  ?Home Care Screening Complete  ?Medication Review (RN CM) Complete  ? ? ? ?

## 2021-12-29 DIAGNOSIS — R042 Hemoptysis: Secondary | ICD-10-CM

## 2021-12-29 DIAGNOSIS — K920 Hematemesis: Secondary | ICD-10-CM | POA: Diagnosis not present

## 2021-12-29 DIAGNOSIS — D649 Anemia, unspecified: Secondary | ICD-10-CM | POA: Diagnosis not present

## 2021-12-29 DIAGNOSIS — I120 Hypertensive chronic kidney disease with stage 5 chronic kidney disease or end stage renal disease: Secondary | ICD-10-CM

## 2021-12-29 DIAGNOSIS — N186 End stage renal disease: Secondary | ICD-10-CM | POA: Diagnosis not present

## 2021-12-29 LAB — CULTURE, BLOOD (ROUTINE X 2)
Culture: NO GROWTH
Culture: NO GROWTH
Special Requests: ADEQUATE

## 2021-12-29 LAB — PREPARE RBC (CROSSMATCH)

## 2021-12-29 MED ORDER — SODIUM CHLORIDE 0.9% IV SOLUTION: Freq: Once | INTRAVENOUS | Status: DC

## 2021-12-29 NOTE — Progress Notes (Signed)
Subjective:  Patient has been accepted at [facility]-  due for HD #4 later today -  he is still very weak and has required O2 again-  upset about being here in general -  said he talked to his sister and now consents for blood transfusion-  I confirmed with him twice  ? ?Objective ?Vital signs in last 24 hours: ?Vitals:  ? 12/28/21 2053 12/29/21 0503 12/29/21 5093 12/29/21 2671  ?BP: (!) 144/98 (!) 156/93  (!) 148/88  ?Pulse: (!) 102 87  90  ?Resp: 18 18  20   ?Temp: 98.2 ?F (36.8 ?C) 98 ?F (36.7 ?C)  98.6 ?F (37 ?C)  ?TempSrc:  Oral  Oral  ?SpO2: 99% 96%  92%  ?Weight:   73.6 kg   ?Height:      ? ?Weight change:  ? ?Intake/Output Summary (Last 24 hours) at 12/29/2021 1130 ?Last data filed at 12/29/2021 0503 ?Gross per 24 hour  ?Intake 600 ml  ?Output 300 ml  ?Net 300 ml  ? ? ?Assessment/Plan: 46 year old BM with progressive CKD over the last 2 mos-  now will need dialysis  ?1.Renal- progressive CKD that has been rapid over the last 2 mos-  thought to be due to HTN but has proteinuria and hematuria so very well could have a GN. However, the horse is out of the barn ,  he is ESRD.  He agreed to dialysis because he felt terrible-  s/p first HD in the middle of the night 3/27.    second treatment on 3/29 then third treatment 3/30.  It sounds like he is willing to have AVF placed-  dont think they would do with low hgb as complicating factor- but on 4/1 consented to blood so possibly could do next week.   Has a [facility] for now.  Will plan for next HD today, then continue on TTS schedule-  has now an OP spot at [facility] to start on Tuesday  ?2. Hypertension/volume  - clearly overloaded although interesting not really peripheral edema-  challenge with HD and also gave  lasix to assist-  UOP Not well recorded.  CXR has the appearance of PNA and hemoptysis so started on abx as well- now stopped    BP Is high but better on his multidrug regimen ( norvasc 10/clonidine 0.2 BID and labetalol 400 BID) - no changes in BP reg today-   once volume status is optimized will hopefully be able to wean his O2 ?3. Anemia  - severe- anemia of CKD-   refused transfusion  -  giving high dose ESA and iron.  Agrees to transfusion today 4/1  ?4. Bones-  phos  6.5 -  started phoslo-   PTH 236-  no meds yet  ?5. Dispo-  has spot so would be ready but weak and O2 dep-  transfusion should help  ?  ? ? ? ?Roy Randolph  ? ? ?Labs: ?Basic Metabolic Panel: ?Recent Labs  ?Lab 12/24/21 ?1130 12/25/21 ?1939 12/27/21 ?2458  ?NA 130* 131* 135  ?K 4.5 4.0 4.1  ?CL 99 99 100  ?CO2 14* 15* 25  ?GLUCOSE 127* 102* 117*  ?BUN 143* 104* 69*  ?CREATININE 20.12* 16.29* 13.22*  ?CALCIUM 8.8* 8.4* 8.6*  ?PHOS  --  6.5* 6.2*  ? ?Liver Function Tests: ?Recent Labs  ?Lab 12/24/21 ?1130 12/25/21 ?1939 12/27/21 ?0998  ?AST 21  --   --   ?ALT 13  --   --   ?ALKPHOS  72  --   --   ?BILITOT 0.9  --   --   ?PROT 7.4  --   --   ?ALBUMIN 2.9* 2.6* 2.4*  ? ?No results for input(s): LIPASE, AMYLASE in the last 168 hours. ?No results for input(s): AMMONIA in the last 168 hours. ?CBC: ?Recent Labs  ?Lab 12/24/21 ?1130 12/25/21 ?4315 12/27/21 ?0600  ?WBC 6.6 6.6 9.3  ?NEUTROABS 5.6  --   --   ?HGB 6.2* 5.8* 5.5*  ?HCT 19.4* 17.3* 16.5*  ?MCV 79.5* 78.3* 80.9  ?PLT 80* 83* 123*  ? ?Cardiac Enzymes: ?No results for input(s): CKTOTAL, CKMB, CKMBINDEX, TROPONINI in the last 168 hours. ?CBG: ?No results for input(s): GLUCAP in the last 168 hours. ? ?Iron Studies: No results for input(s): IRON, TIBC, TRANSFERRIN, FERRITIN in the last 72 hours. ?Studies/Results: ?No results found. ?Medications: ?Infusions: ? sodium chloride    ? ? ?Scheduled Medications: ? amLODipine  10 mg Oral Daily  ? calcium acetate  667 mg Oral TID WC  ? Chlorhexidine Gluconate Cloth  6 each Topical Q0600  ? cloNIDine  0.2 mg Oral BID  ? darbepoetin (ARANESP) injection - DIALYSIS  300 mcg Intravenous Q Wed-HD  ? feeding supplement (NEPRO CARB STEADY)  237 mL Oral BID BM  ? labetalol  400 mg Oral BID  ? multivitamin  1 tablet  Oral QHS  ? pantoprazole  40 mg Oral Daily  ? sertraline  25 mg Oral Daily  ? ? have reviewed scheduled and prn medications. ? ?Physical Exam: ?General:  looks weak again  ?Heart: RRR ?Lungs:  CBS bilat ?Abdomen: soft, non tender ?Extremities: min pitting edema ?Dialysis Access: right IJ temp cath placed 3/27 -  converted to [facility] on 3/30  ? ? ?12/29/2021,11:30 AM ? LOS: 5 days  ? ?  ? ? ? ? ?

## 2021-12-29 NOTE — Progress Notes (Signed)
? ?HD#5 ?SUBJECTIVE:  ?Patient Summary: Roy Randolph is a 46 year old male with past medical history of anxiety with panic attacks newly treated with Zoloft, hypertension, new diagnosis of CKD stage IV (January 2023), pneumothorax in his 53s (was a thin smoker at that time) who presented from nephrology office where he was following up for new diagnosis of CKD stage IV where he began to feel severely ill, like he was "going to die."and admitted for CKD stage 4 now progressed to ESRD and symptomatic anemia. ? ?Overnight Events: None ? ?Interim History: Seen and evaluated patient at bedside. He reports that he has been feeling better since starting dialysis. He reports that he is waning to get blood products during transfusion. Reports that he is tired of feeling so short winded, even while watching movies on couch. He denies any bleeding today.  ? ?OBJECTIVE:  ?Vital Signs: ?Vitals:  ? 12/29/21 0717 12/29/21 0952 12/29/21 1400 12/29/21 1515  ?BP:  (!) 148/88 (!) 166/100 (!) 175/108  ?Pulse:  90 88 87  ?Resp:  20 (!) 21 16  ?Temp:  98.6 ?F (37 ?C) 98.2 ?F (36.8 ?C) 98.2 ?F (36.8 ?C)  ?TempSrc:  Oral Oral Oral  ?SpO2:  92% 93% 96%  ?Weight: 73.6 kg     ?Height:      ? ?Supplemental O2: Room Air ?SpO2: 96 % ?O2 Flow Rate (L/min): 2 L/min ? ?Filed Weights  ? 12/27/21 1430 12/27/21 1810 12/29/21 0717  ?Weight: 76.3 kg 75.3 kg 73.6 kg  ? ? ? ?Intake/Output Summary (Last 24 hours) at 12/29/2021 1527 ?Last data filed at 12/29/2021 0503 ?Gross per 24 hour  ?Intake 360 ml  ?Output 300 ml  ?Net 60 ml  ? ? ?Net IO Since Admission: -2,059.9 mL [12/29/21 1527] ? ?Physical Exam: ?Constitutional:Chronically-ill appearing gentleman resting comfortably in bed, no acute distress. ?HENT:No bleeding or dried clots noted in oropharynx. Mucosa is pale but unchanged. ?Cardio:Regular rate and rhythm. No murmurs, rubs, gallops. ?Pulm: lungs clear. Normal work of breathing on room air. ?Abdomen: Soft, nontender, nondistended. ?MSK:  Negative for extremity edema. ?Skin:Warm and dry. ?Neuro:Alert and oriented x3, no focal deficit noted. ?Psych: Pleasant mood and affect. ? ?Patient Lines/Drains/Airways Status   ? ? Active Line/Drains/Airways   ? ? Name Placement date Placement time Site Days  ? Peripheral IV 12/24/21 20 G Anterior;Distal;Right;Upper Arm 12/24/21  --  Arm  4  ? Hemodialysis Catheter Right Internal jugular Double lumen Permanent (Tunneled) 12/27/21  0917  Internal jugular  1  ? ?  ?  ? ?  ? ? ? ?ASSESSMENT/PLAN:  ?Assessment: ?Principal Problem: ?  ESRD needing dialysis ([place]) ?Active Problems: ?  Thrombocytopenia ([place]) ?  Essential hypertension ?  Uremia ?  Anemia of chronic kidney failure, stage 5 ([facility]) ? ? ?Plan: ?ESRD with need for HD ?Anion gap metabolic acidosis ?Patient has undergone three HD sessions thus far with continued improvement of renal function labs, breathing, and mucosal bleeding. ?-Nephrology consulted, appreciate their assistance ?-planned for HD today, then starting TTSa schedule ?- He is wanting blood transfusion.  ?-Renal navigator on board to help with outpatient dispo concerns ?-Trend renal function ?-Avoid nephrotoxic agents ?  ?Acute hypoxic respiratory failure ?Community acquired pneumonia ?Oxygen requirement has continued to downtrend since admission, currently on room air with no complaints of shortness of breath or chest pain.  Blood cultures continue to show NGTD.  ?-blood cultures negative X5 days ?-Maintain O2 supplementation with SpO2 goal greater than 93% ?  ?Microcytic anemia ?Patient  is receiving ESA and iron with HD.  He denies any signs of bleeding in his stool or urine and reports that epistaxis, gum bleeding have not recurred in several days, and hemoptysis occurred once yesterday evening and was a dark red clot. ?-Nephrology consulted, appreciate their assistance ?-Continue ESA and iron. He is wanting blood transfusion. Will follow up h+H post transfusion ?-Trend CBC ?  ?Hypertension ?BP is  overall downtrending, largely in SBP 150s-160s range, though still elevated despite anti-hypertensive therapy and HD. ?-Continue home amlodipine 10 mg daily, clonidine 0.2 mg twice daily, labetalol 400 mg twice daily ?-CTM ?  ?Hx anxiety with panic attacks ?-Continue Zoloft 25 mg daily ?-Alprazolam 0.5 mg TID PRN panic attacks ? ?Best Practice: ?Diet: Renal diet ?IVF: None ?VTE: Place and maintain sequential compression device Start: 12/25/21 0714 ?Code: Full ?AB: None ?Therapy Recs: None ?Family Contact: Tawana, called and notified. ?DISPO: Anticipated discharge in 1-2 days to Home pending  outpatient HD bed and medical stability . ? ?Signature: ?Delene Ruffini, MD ?Internal Medicine Resident, PGY-1 ?[facility]  ?Pager: [phone] ?3:27 PM, 12/29/2021  ? ?Please contact the on call pager after 5 pm and on weekends at [phone]. ? ?

## 2021-12-30 LAB — TYPE AND SCREEN
ABO/RH(D): O POS
Antibody Screen: NEGATIVE
Unit division: 0
Unit division: 0

## 2021-12-30 LAB — BPAM RBC
Blood Product Expiration Date: 202305032359
Blood Product Expiration Date: 202305042359
ISSUE DATE / TIME: 202304011503
ISSUE DATE / TIME: 202304011505
Unit Type and Rh: 5100
Unit Type and Rh: 5100

## 2021-12-30 LAB — BASIC METABOLIC PANEL
Anion gap: 16 — ABNORMAL HIGH (ref 5–15)
BUN: 19 mg/dL (ref 6–20)
CO2: 23 mmol/L (ref 22–32)
Calcium: 8.9 mg/dL (ref 8.9–10.3)
Chloride: 95 mmol/L — ABNORMAL LOW (ref 98–111)
Creatinine, Ser: 6.61 mg/dL — ABNORMAL HIGH (ref 0.61–1.24)
GFR, Estimated: 10 mL/min — ABNORMAL LOW (ref >60)
Glucose, Bld: 92 mg/dL (ref 70–99)
Potassium: 3.8 mmol/L (ref 3.5–5.1)
Sodium: 134 mmol/L — ABNORMAL LOW (ref 135–145)

## 2021-12-30 LAB — CBC
HCT: 25.1 % — ABNORMAL LOW (ref 39.0–52.0)
Hemoglobin: 8 g/dL — ABNORMAL LOW (ref 13.0–17.0)
MCH: 26.6 pg (ref 26.0–34.0)
MCHC: 31.9 g/dL (ref 30.0–36.0)
MCV: 83.4 fL (ref 80.0–100.0)
Platelets: 133 10*3/uL — ABNORMAL LOW (ref 150–400)
RBC: 3.01 MIL/uL — ABNORMAL LOW (ref 4.22–5.81)
RDW: 23.3 % — ABNORMAL HIGH (ref 11.5–15.5)
WBC: 7 10*3/uL (ref 4.0–10.5)
nRBC: 1.7 % — ABNORMAL HIGH (ref 0.0–0.2)

## 2021-12-30 MED ORDER — CLONIDINE HCL 0.3 MG PO TABS
0.3000 mg | ORAL_TABLET | Freq: Three times a day (TID) | ORAL | Status: DC
  Administered 2021-12-30 – 2022-01-02 (×9): 0.3 mg via ORAL
  Filled 2021-12-30 (×9): qty 1

## 2021-12-30 NOTE — Progress Notes (Signed)
Subjective:  s/p HD #4 yesterday -  able to get BFR 400 with new cath- removed 2500 and also gave 2 units of blood -  hgb 8 this AM-  BP is still high.  He looks like a new man-  feels so much better  ? ?Objective ?Vital signs in last 24 hours: ?Vitals:  ? 12/29/21 2008 12/29/21 2254 12/30/21 0511 12/30/21 0947  ?BP: (!) 195/142 (!) 152/105 (!) 182/117 (!) 176/110  ?Pulse: 93 80 90 87  ?Resp: 18 17 18 18   ?Temp: 98.2 ?F (36.8 ?C) 98.2 ?F (36.8 ?C) 98.1 ?F (36.7 ?C) 97.8 ?F (36.6 ?C)  ?TempSrc: Oral Oral Oral Oral  ?SpO2: 100% 95% 90% 93%  ?Weight:      ?Height:      ? ?Weight change:  ? ?Intake/Output Summary (Last 24 hours) at 12/30/2021 1046 ?Last data filed at 12/29/2021 2300 ?Gross per 24 hour  ?Intake 750 ml  ?Output 2500 ml  ?Net -1750 ml  ? ? ?Assessment/Plan: 46 year old BM with progressive CKD over the last 2 mos-  now will need dialysis  ?1.Renal- progressive CKD that has been rapid over the last 2 mos-  thought to be due to HTN but has proteinuria and hematuria so very well could have a GN. However, the horse is clearly out of the barn at this point, he is ESRD.   s/p first HD in the middle of the night 3/27.    second treatment on 3/29 then third treatment 3/30- 4th on 4/1.  It sounds like he is willing to have AVF placed-  not sure they would do with low hgb ( JW) as complicating factor- but now s/p transfusion.  Has a [facility] for now.  Will plan for next HD on Tuesday, then continue on TTS schedule-  has out of state medicaid so that complicates his OP placement but accepted at [facility].  Have ordered vein map for tomorrow and will call vascular tomorrow as would be great to get access before he leaves ?2. Hypertension/volume  - clearly overloaded although interesting not really peripheral edema-  challenge UF with HD.      BP Is high on his multidrug regimen ( norvasc 10/clonidine 0.2 BID and labetalol 400 BID)  once volume status is optimized will hopefully be able to wean-  still high-  will inc his  clonidine today to 0.3 TID  ?3. Anemia  - severe- anemia of CKD-   refused transfusion until yesterday-  got 2 units  -  also giving high dose ESA and iron  ?4. Bones-  phos  6.5 -  started phoslo-   PTH 236-  no meds  ?  ? ? ? ?Louis Meckel  ? ? ?Labs: ?Basic Metabolic Panel: ?Recent Labs  ?Lab 12/25/21 ?1939 12/27/21 ?0655 12/30/21 ?0400  ?NA 131* 135 134*  ?K 4.0 4.1 3.8  ?CL 99 100 95*  ?CO2 15* 25 23  ?GLUCOSE 102* 117* 92  ?BUN 104* 69* 19  ?CREATININE 16.29* 13.22* 6.61*  ?CALCIUM 8.4* 8.6* 8.9  ?PHOS 6.5* 6.2*  --   ? ?Liver Function Tests: ?Recent Labs  ?Lab 12/24/21 ?1130 12/25/21 ?1939 12/27/21 ?5284  ?AST 21  --   --   ?ALT 13  --   --   ?ALKPHOS 72  --   --   ?BILITOT 0.9  --   --   ?PROT 7.4  --   --   ?ALBUMIN 2.9* 2.6* 2.4*  ? ?No  results for input(s): LIPASE, AMYLASE in the last 168 hours. ?No results for input(s): AMMONIA in the last 168 hours. ?CBC: ?Recent Labs  ?Lab 12/24/21 ?1130 12/25/21 ?6060 12/27/21 ?0600 12/30/21 ?0400  ?WBC 6.6 6.6 9.3 7.0  ?NEUTROABS 5.6  --   --   --   ?HGB 6.2* 5.8* 5.5* 8.0*  ?HCT 19.4* 17.3* 16.5* 25.1*  ?MCV 79.5* 78.3* 80.9 83.4  ?PLT 80* 83* 123* 133*  ? ?Cardiac Enzymes: ?No results for input(s): CKTOTAL, CKMB, CKMBINDEX, TROPONINI in the last 168 hours. ?CBG: ?No results for input(s): GLUCAP in the last 168 hours. ? ?Iron Studies: No results for input(s): IRON, TIBC, TRANSFERRIN, FERRITIN in the last 72 hours. ?Studies/Results: ?No results found. ?Medications: ?Infusions: ? sodium chloride    ? ? ?Scheduled Medications: ? sodium chloride   Intravenous Once  ? amLODipine  10 mg Oral Daily  ? calcium acetate  667 mg Oral TID WC  ? Chlorhexidine Gluconate Cloth  6 each Topical Q0600  ? cloNIDine  0.2 mg Oral BID  ? darbepoetin (ARANESP) injection - DIALYSIS  300 mcg Intravenous Q Wed-HD  ? feeding supplement (NEPRO CARB STEADY)  237 mL Oral BID BM  ? labetalol  400 mg Oral BID  ? multivitamin  1 tablet Oral QHS  ? pantoprazole  40 mg Oral Daily  ?  sertraline  25 mg Oral Daily  ? ? have reviewed scheduled and prn medications. ? ?Physical Exam: ?General:  looks better  ?Heart: RRR ?Lungs:  CBS bilat ?Abdomen: soft, non tender ?Extremities: min pitting edema ?Dialysis Access: right IJ temp cath placed 3/27 -  converted to [facility] on 3/30  ? ? ?12/30/2021,10:46 AM ? LOS: 6 days  ? ?  ? ? ? ? ?

## 2021-12-30 NOTE — Progress Notes (Signed)
? ?HD#6 ?SUBJECTIVE:  ?Patient Summary: Roy Randolph is a 46 year old male with past medical history of anxiety with panic attacks newly treated with Zoloft, hypertension, new diagnosis of CKD stage IV (January 2023), pneumothorax in his 57s (was a thin smoker at that time) who presented from nephrology office where he was following up for new diagnosis of CKD stage IV where he began to feel severely ill, like he was "going to die."and admitted for CKD stage 4 now progressed to ESRD and symptomatic anemia. ? ?Overnight Events: None ? ?Interim History:  ?Patient seen and evaluated at bedside this AM.  He states that he is doing well today.  He was able to tolerate dialysis well yesterday, and feels more energetic after his blood transfusion. He was able to walk around the room yesterday without the need for his oxygen. ? ?OBJECTIVE:  ?Vital Signs: ?Vitals:  ? 12/29/21 1843 12/29/21 2008 12/29/21 2254 12/30/21 0511  ?BP: (!) 172/116 (!) 195/142 (!) 152/105 (!) 182/117  ?Pulse: 88 93 80 90  ?Resp: 20 18 17 18   ?Temp: 97.7 ?F (36.5 ?C) 98.2 ?F (36.8 ?C) 98.2 ?F (36.8 ?C) 98.1 ?F (36.7 ?C)  ?TempSrc: Oral Oral Oral Oral  ?SpO2: 98% 100% 95% 90%  ?Weight:      ?Height:      ? ?Supplemental O2: Room Air ?SpO2: 90 % ?O2 Flow Rate (L/min): 3 L/min ? ?Filed Weights  ? 12/27/21 1810 12/29/21 0717 12/29/21 1400  ?Weight: 75.3 kg 73.6 kg 73.6 kg  ? ? ? ?Intake/Output Summary (Last 24 hours) at 12/30/2021 0659 ?Last data filed at 12/29/2021 2300 ?Gross per 24 hour  ?Intake 990 ml  ?Output 2500 ml  ?Net -1510 ml  ? ?Net IO Since Admission: -3,569.9 mL [12/30/21 0659] ? ?Physical Exam: ?Physical Exam ?Constitutional:   ?   Comments: Ill-appearing gentleman, resting comfortably in bed, no acute distress  ?HENT:  ?   Head: Normocephalic and atraumatic.  ?Cardiovascular:  ?   Rate and Rhythm: Normal rate and regular rhythm.  ?   Pulses: Normal pulses.  ?   Heart sounds: Normal heart sounds. No murmur heard. ?  No friction rub. No  gallop.  ?Pulmonary:  ?   Effort: Pulmonary effort is normal.  ?   Breath sounds: Normal breath sounds. No wheezing or rales.  ?Abdominal:  ?   General: Abdomen is flat. Bowel sounds are normal.  ?   Tenderness: There is no abdominal tenderness.  ?Neurological:  ?   Mental Status: He is oriented to person, place, and time.  ?Psychiatric:     ?   Mood and Affect: Mood normal.     ?   Behavior: Behavior normal.  ?  ? ?Patient Lines/Drains/Airways Status   ? ? Active Line/Drains/Airways   ? ? Name Placement date Placement time Site Days  ? Peripheral IV 12/24/21 20 G Anterior;Distal;Right;Upper Arm 12/24/21  --  Arm  4  ? Hemodialysis Catheter Right Internal jugular Double lumen Permanent (Tunneled) 12/27/21  0917  Internal jugular  1  ? ?  ?  ? ?  ? ? ? ?ASSESSMENT/PLAN:  ?Assessment: ?Principal Problem: ?  ESRD needing dialysis ([place]) ?Active Problems: ?  Thrombocytopenia ([place]) ?  Essential hypertension ?  Uremia ?  Anemia of chronic kidney failure, stage 5 (HCC) ? ? ?Plan: ?ESRD with need for HD ?Anion Gap Metabolic Acidosis ?Uremia has improved to 19.  Patient has not noticed any oozing or clots. ?- Nephrology consulted, appreciate their assistance ?-  Starting TTSa schedule ?-Renal navigator on board to help with outpatient dispo concerns ?- Trend renal function ?- Avoid nephrotoxic agents ?- Plan for him to gain access with IVF during this admission, nephrology will speak with vascular tomorrow for AVF. ?  ?Acute hypoxic respiratory failure ?Community acquired pneumonia ?Oxygen requirement has continued to downtrend since admission, currently on room air with no complaints of shortness of breath or chest pain.  Blood cultures continue to show NGTD.  ?-blood cultures negative X5 days ?-Maintain O2 supplementation with SpO2 goal greater than 93% ?  ?Microcytic anemia ?S/P 2 units of blood, improvement in his Hgb, 8.0 from 5.5 on last blood draw.  He feels very energized today. ?-Nephrology consulted, appreciate their  assistance ?-Continue ESA and iron.  ?-Trend CBC ?  ?Hypertension ?-Continue home amlodipine 10 mg daily, clonidine 0.2 mg twice daily, labetalol 400 mg twice daily ?-Continuing to monitor ?  ?Hx anxiety with panic attacks ?-Continue Zoloft 25 mg daily ?-Alprazolam 0.5 mg TID PRN panic attacks ? ?Best Practice: ?Diet: Renal diet ?IVF: None ?VTE: Place and maintain sequential compression device Start: 12/25/21 0714 ?Code: Full ?AB: None ?Therapy Recs: None ?Family Contact: Tawana, called and notified. ?DISPO: Anticipated discharge in 1-2 days to Home pending  outpatient HD bed and medical stability . ? ?Signature: ?Maudie Mercury, MD ?IMTS, PGY-3 ?12/30/2021,7:05 AM ?Please contact the on call pager after 5 pm and on weekends at [phone]. ? ?

## 2021-12-30 NOTE — Progress Notes (Addendum)
? ?HD#7 ?SUBJECTIVE:  ?Patient Summary: Roy Randolph is a 46 year old male with past medical history of anxiety with panic attacks newly treated with Zoloft, hypertension, new diagnosis of CKD stage IV (January 2023), pneumothorax in his 84s (was a thin smoker at that time) who presented from nephrology office where he was following up for new diagnosis of CKD stage IV where he began to feel severely ill, like he was "going to die."and admitted for CKD stage 4 now progressed to ESRD and symptomatic anemia. ? ?Overnight Events: None ? ?Interim History:  ?Patient seen and evaluated at bedside this AM.  He states that he is doing well today.  Feels improved after blood transfusion. Reports his level of functioning has greatly improved as well. Tolerating food. No further bleeding noted. ? ?OBJECTIVE:  ?Vital Signs: ?Vitals:  ? 12/30/21 1751 12/30/21 2054 12/31/21 0509 12/31/21 0857  ?BP: (!) 158/111 (!) 171/114 (!) 162/109 (!) 168/113  ?Pulse: 77 81 71 76  ?Resp: 18 20 18 18   ?Temp: 98.2 ?F (36.8 ?C) 97.9 ?F (36.6 ?C) 97.9 ?F (36.6 ?C) (!) 97.4 ?F (36.3 ?C)  ?TempSrc: Oral Oral Oral   ?SpO2: 98% 97% 99% 97%  ?Weight:   79 kg   ?Height:      ? ?Supplemental O2: Room Air ?SpO2: 97 % ?O2 Flow Rate (L/min): 3 L/min ? ?Filed Weights  ? 12/29/21 0717 12/29/21 1400 12/31/21 0509  ?Weight: 73.6 kg 73.6 kg 79 kg  ? ? ? ?Intake/Output Summary (Last 24 hours) at 12/31/2021 1329 ?Last data filed at 12/31/2021 0855 ?Gross per 24 hour  ?Intake 652 ml  ?Output 0 ml  ?Net 652 ml  ? ?Net IO Since Admission: -2,520.9 mL [12/31/21 1329] ? ?Physical Exam: ?Physical Exam ?Constitutional:   ?   Comments: Ill-appearing gentleman, resting comfortably in bed, no acute distress  ?HENT:  ?   Head: Normocephalic and atraumatic.  ?Cardiovascular:  ?   Rate and Rhythm: Normal rate and regular rhythm.  ?   Pulses: Normal pulses.  ?   Heart sounds: Normal heart sounds. No murmur heard. ?  No friction rub. No gallop.  ?Pulmonary:  ?   Effort:  Pulmonary effort is normal.  ?   Breath sounds: Normal breath sounds. No wheezing or rales.  ?Abdominal:  ?   General: Abdomen is flat. Bowel sounds are normal.  ?   Tenderness: There is no abdominal tenderness.  ?Neurological:  ?   Mental Status: He is oriented to person, place, and time.  ?Psychiatric:     ?   Mood and Affect: Mood normal.     ?   Behavior: Behavior normal.  ?  ? ?Patient Lines/Drains/Airways Status   ? ? Active Line/Drains/Airways   ? ? Name Placement date Placement time Site Days  ? Peripheral IV 12/24/21 20 G Anterior;Distal;Right;Upper Arm 12/24/21  --  Arm  4  ? Hemodialysis Catheter Right Internal jugular Double lumen Permanent (Tunneled) 12/27/21  0917  Internal jugular  1  ? ?  ?  ? ?  ? ? ? ?ASSESSMENT/PLAN:  ?Assessment: ?Principal Problem: ?  ESRD needing dialysis ([place]) ?Active Problems: ?  Thrombocytopenia ([place]) ?  Essential hypertension ?  Uremia ?  Anemia of chronic kidney failure, stage 5 (HCC) ? ? ?Plan: ?ESRD with need for HD ?Anion Gap Metabolic Acidosis ?Blood pressure elevated at 166 systolic this AM, but otherwise stable. Uremia had improved with dialysis.  Patient has not noticed any oozing or clots. ?- Nephrology consulted,  appreciate their assistance ?-Starting TTSa schedule ?-Renal navigator on board to help with outpatient dispo concerns. He has been accepted at [facility] ?- Avoid nephrotoxic agents ?- Plan for him to gain access with either fistula vs graft during this admission. Vein mapping has been completed today.  ?  ? ?Microcytic anemia ?- likely related to slow oozing 2/2 uremia and chronic renal disease. ?S/P 2 units of blood, improvement in his Hgb, 8.0. Hgb has stayed stable with slight downtrend 7.6 this AM. ?-Continue ESA per nephrology ?-S/p ferric gluconate x4, last dose 3/31 ?-Trend daily H+H ?- Consent will need to be obtained before each transfusion.  ?  ?Hypertension ?Blood pressure elevated secondary to volume overload, though he is not showing much  evidence of edema. He is on multidrug regimen (norvasc 10, clonidine, and labetalol) ?- Clonidine increased by nephrology yesterday due to persistently elevated BP. It has improved osme but remains hypertensive. ?-Continue home amlodipine 10 mg daily, clonidine 0.3 mg TID, labetalol 400 mg twice daily for now. ?-Continuing to monitor  ? ?Acute hypoxic respiratory failure ?Community acquired pneumonia ?Oxygen requirement has continued to downtrend since admission, currently on room air with no complaints of shortness of breath or chest pain.  ?-Maintain O2 supplementation with SpO2 goal greater than 93% ?  ?Hx anxiety with panic attacks ?-Continue Zoloft 25 mg daily ?-DC Xanax, he has not required it since the 31st. ? ?Best Practice: ?Diet: Renal diet ?IVF: None ?VTE: Place and maintain sequential compression device Start: 12/25/21 0714 ?Code: Full ?AB: None ?Therapy Recs: None ?Family Contact: Tawana, called and notified. ?DISPO: Anticipated discharge in 1-2 days to Home pending  outpatient HD bed and medical stability . ? ?Signature: ?Delene Ruffini MD ?IMTS, PGY-1 ?12/31/2021,1:29 PM ?Please contact the on call pager after 5 pm and on weekends at [phone]. ? ?

## 2021-12-31 ENCOUNTER — Inpatient Hospital Stay (HOSPITAL_COMMUNITY): Payer: Medicaid Other

## 2021-12-31 DIAGNOSIS — N185 Chronic kidney disease, stage 5: Secondary | ICD-10-CM

## 2021-12-31 DIAGNOSIS — N186 End stage renal disease: Secondary | ICD-10-CM

## 2021-12-31 LAB — CBC
HCT: 23.9 % — ABNORMAL LOW (ref 39.0–52.0)
Hemoglobin: 7.7 g/dL — ABNORMAL LOW (ref 13.0–17.0)
MCH: 26.7 pg (ref 26.0–34.0)
MCHC: 32.2 g/dL (ref 30.0–36.0)
MCV: 83 fL (ref 80.0–100.0)
Platelets: 132 10*3/uL — ABNORMAL LOW (ref 150–400)
RBC: 2.88 MIL/uL — ABNORMAL LOW (ref 4.22–5.81)
RDW: 24 % — ABNORMAL HIGH (ref 11.5–15.5)
WBC: 7.3 10*3/uL (ref 4.0–10.5)
nRBC: 0.5 % — ABNORMAL HIGH (ref 0.0–0.2)

## 2021-12-31 NOTE — Plan of Care (Signed)
?  Problem: Education: ?Goal: Knowledge of disease and its progression will improve ?Outcome: Completed/Met ?  ?Problem: Health Behavior/Discharge Planning: ?Goal: Ability to manage health-related needs will improve ?Outcome: Completed/Met ?  ?Problem: Clinical Measurements: ?Goal: Complications related to the disease process or treatment will be avoided or minimized ?Outcome: Completed/Met ?Goal: Dialysis access will remain free of complications ?Outcome: Completed/Met ?  ?Problem: Activity: ?Goal: Activity intolerance will improve ?Outcome: Completed/Met ?  ?Problem: Fluid Volume: ?Goal: Fluid volume balance will be maintained or improved ?Outcome: Completed/Met ?  ?Problem: Nutritional: ?Goal: Ability to make appropriate dietary choices will improve ?Outcome: Completed/Met ?  ?Problem: Respiratory: ?Goal: Respiratory symptoms related to disease process will be avoided ?Outcome: Completed/Met ?  ?Problem: Self-Concept: ?Goal: Body image disturbance will be avoided or minimized ?Outcome: Completed/Met ?  ?Problem: Urinary Elimination: ?Goal: Progression of disease will be identified and treated ?Outcome: Completed/Met ?  ?

## 2021-12-31 NOTE — Progress Notes (Signed)
Subjective:  Seen in room.  Feeling much better.  VVS c/s- for AVF later this week ? ?Objective ?Vital signs in last 24 hours: ?Vitals:  ? 12/30/21 1751 12/30/21 2054 12/31/21 0509 12/31/21 0857  ?BP: (!) 158/111 (!) 171/114 (!) 162/109 (!) 168/113  ?Pulse: 77 81 71 76  ?Resp: 18 20 18 18   ?Temp: 98.2 ?F (36.8 ?C) 97.9 ?F (36.6 ?C) 97.9 ?F (36.6 ?C) (!) 97.4 ?F (36.3 ?C)  ?TempSrc: Oral Oral Oral   ?SpO2: 98% 97% 99% 97%  ?Weight:   79 kg   ?Height:      ? ?Weight change: 5.4 kg ? ?Intake/Output Summary (Last 24 hours) at 12/31/2021 1611 ?Last data filed at 12/31/2021 0855 ?Gross per 24 hour  ?Intake 652 ml  ?Output 0 ml  ?Net 652 ml  ? ? ?Assessment/Plan: 46 year old M with progressive CKD over the last 2 mos-  now will need dialysis  ?1.Renal- progressive CKD that has been rapid over the last 2 mos-  thought to be due to HTN but has proteinuria and hematuria so very well could have a GN. However, the horse is out of the barn ,  he is ESRD.  He agreed to dialysis because he felt terrible-  s/p first HD in the middle of the night 3/27.    second treatment on 3/29 then third treatment 3/30.  It sounds like he is willing to have AVF placed-  dont think they would do with low hgb as complicating factor- but on 4/1 consented to blood so possibly could do next week.   Has a [facility] for now.  HD next Tuesday- has been CLIP'd ?- VS consulted, appreciate assistance ? ?2. Hypertension/volume  - clearly overloaded although interesting not really peripheral edema-  challenge with HD and also gave  lasix to assist-  UOP Not well recorded.  CXR has the appearance of PNA and hemoptysis so started on abx as well- now stopped    BP Is high but better on his multidrug regimen ( norvasc 10/clonidine 0.2 BID and labetalol 400 BID) - no changes in BP reg today-  once volume status is optimized will hopefully be able to wean his O2 ?3. Anemia  - severe- anemia of CKD-   refused transfusion  -  giving high dose ESA and iron.  S/p transfusion  4/1 ?4. Bones-  phos  6.5 -  started phoslo-   PTH 236-  no meds yet  ?5. Dispo-  pending AVF ?  ? ? ? ?Roy Randolph  ? ? ?Labs: ?Basic Metabolic Panel: ?Recent Labs  ?Lab 12/25/21 ?1939 12/27/21 ?0655 12/30/21 ?0400  ?NA 131* 135 134*  ?K 4.0 4.1 3.8  ?CL 99 100 95*  ?CO2 15* 25 23  ?GLUCOSE 102* 117* 92  ?BUN 104* 69* 19  ?CREATININE 16.29* 13.22* 6.61*  ?CALCIUM 8.4* 8.6* 8.9  ?PHOS 6.5* 6.2*  --   ? ?Liver Function Tests: ?Recent Labs  ?Lab 12/25/21 ?1939 12/27/21 ?6644  ?ALBUMIN 2.6* 2.4*  ? ?No results for input(s): LIPASE, AMYLASE in the last 168 hours. ?No results for input(s): AMMONIA in the last 168 hours. ?CBC: ?Recent Labs  ?Lab 12/25/21 ?0347 12/27/21 ?0600 12/30/21 ?0400 12/31/21 ?4259  ?WBC 6.6 9.3 7.0 7.3  ?HGB 5.8* 5.5* 8.0* 7.7*  ?HCT 17.3* 16.5* 25.1* 23.9*  ?MCV 78.3* 80.9 83.4 83.0  ?PLT 83* 123* 133* 132*  ? ?Cardiac Enzymes: ?No results for input(s): CKTOTAL, CKMB, CKMBINDEX, TROPONINI in the last 168 hours. ?CBG: ?  No results for input(s): GLUCAP in the last 168 hours. ? ?Iron Studies: No results for input(s): IRON, TIBC, TRANSFERRIN, FERRITIN in the last 72 hours. ?Studies/Results: ?No results found. ?Medications: ?Infusions: ? sodium chloride    ? ? ?Scheduled Medications: ? sodium chloride   Intravenous Once  ? amLODipine  10 mg Oral Daily  ? calcium acetate  667 mg Oral TID WC  ? Chlorhexidine Gluconate Cloth  6 each Topical Q0600  ? cloNIDine  0.3 mg Oral TID  ? darbepoetin (ARANESP) injection - DIALYSIS  300 mcg Intravenous Q Wed-HD  ? feeding supplement (NEPRO CARB STEADY)  237 mL Oral BID BM  ? labetalol  400 mg Oral BID  ? multivitamin  1 tablet Oral QHS  ? pantoprazole  40 mg Oral Daily  ? sertraline  25 mg Oral Daily  ? ? have reviewed scheduled and prn medications. ? ?Physical Exam: ?General:  sitting in chair, NAD ?Heart: RRR ?Lungs:  clear- on RA ?Abdomen: soft, non tender ?Extremities: min pitting edema ?Dialysis Access: R TDC- dressing looks good ? ? ?12/31/2021,4:11 PM ?  LOS: 7 days  ? ?  ? ? ? ? ?

## 2021-12-31 NOTE — Consult Note (Addendum)
?Hospital Consult ? ? ? ?Reason for Consult:  in need of dialysis access ?Requesting Physician:  Moshe Cipro ?MRN #:  [id number] ? ?History of Present Illness: This is a 46 y.o. male who presented to the hospital with SOB, N/V and unintentional weight loss.  He has hx of CKD IV that was recently diagnosed but was not willing to have access at that time.  [facility] is consulted for HD access.   ? ?He has hx of severe HTN.  During his last admission, he was found to have anemia and thrombocytopenia.  His platelet count today is 132k.   ? ?Pt had TDC placed 12/27/2021 by IR.  ? ?Pt is right hand dominant.   ? ?He denies any chest pain or hx of stroke.  He denies any claudication or non healing wounds.   ? ?The pt is not on a statin for cholesterol management.  ?The pt is not on a daily aspirin.   Other AC:  none ?The pt is on CCB, BB, clonidine for hypertension.   ?The pt is not diabetic.   ?Tobacco hx:  he recently quit smoking ? ?Past Medical History:  ?Diagnosis Date  ? Abnormal EKG 10/10/2021  ? Acute renal failure superimposed on stage 4 chronic kidney disease ([place]) 10/10/2021  ? Alcohol use   ? Alcohol use disorder in remission 11/14/2021  ? Elevated troponin 10/10/2021  ? Epigastric pain 10/10/2021  ? H/O alcohol abuse 10/10/2021  ? History of migraine 11/14/2021  ? Hypertension   ? Hypertensive emergency due to non compliance 10/10/2021  ? Hypokalemia 10/10/2021  ? Left arm numbness and pain 10/10/2021  ? Marijuana use 10/10/2021  ? Noncompliance   ? NSAID long-term use 10/10/2021  ? PRES (posterior reversible encephalopathy syndrome)   ? Smoker 10/10/2021  ? Smoking   ? Syncope, vasovagal 10/10/2021  ? Thrombocytopenia ([place]) 10/10/2021  ? Vision disturbance 11/14/2021  ? Vomiting 10/10/2021  ? ? ?Past Surgical History:  ?Procedure Laterality Date  ? IR FLUORO GUIDE CV LINE RIGHT  12/24/2021  ? IR FLUORO GUIDE CV LINE RIGHT  12/27/2021  ? IR US GUIDE VASC ACCESS RIGHT  12/24/2021  ? THORACOTOMY Right   ? ? ?Allergies  ?Allergen  Reactions  ? Cucumber Extract Anaphylaxis and Itching  ? Other Anaphylaxis, Itching, Swelling and Other (See Comments)  ?  The patient CANNOT eat raw/fresh fruit or vegetables. Reaction starts with ears itching, then progresses from there. He can eat canned versions of these foods, however.  ? Tomato (Diagnostic) Anaphylaxis and Swelling  ? Wild Lettuce [Wild Lettuce Extract (Lactuca Virosa)] Anaphylaxis and Swelling  ? Hydralazine Hives, Itching and Other (See Comments)  ?  SEVERE ITCHING  ? ? ?Prior to Admission medications   ?Medication Sig Start Date End Date Taking? Authorizing Provider  ?acetaminophen (TYLENOL) 325 MG tablet Take 325-650 mg by mouth every 6 (six) hours as needed for mild pain or headache.   Yes [provider]  ?amLODipine (NORVASC) 10 MG tablet Take 1 tablet (10 mg total) by mouth daily. 12/14/21  Yes Masters, Joellen Jersey, DO  ?Ascorbic Acid (VITAMIN C PO) Take 1 tablet by mouth daily.   Yes [provider]  ?cloNIDine (CATAPRES) 0.2 MG tablet Take 1 tablet (0.2 mg total) by mouth 2 (two) times daily. 12/20/21  Yes Masters, Katie, DO  ?labetalol (NORMODYNE) 300 MG tablet Take 1 tablet (300 mg total) by mouth 2 (two) times daily. 12/14/21  Yes Masters, Joellen Jersey, DO  ?Multiple Vitamins-Minerals (CERTAVITE/ANTIOXIDANTS) TABS Take  1 tablet by mouth at bedtime. 12/07/21  Yes Ghimire, Henreitta Leber, MD  ?pantoprazole (PROTONIX) 40 MG tablet Take 1 tablet (40 mg total) by mouth daily. 12/07/21  Yes Ghimire, Henreitta Leber, MD  ?Potassium 95 MG TABS Take 95 mg by mouth daily.   Yes [provider]  ?sertraline (ZOLOFT) 25 MG tablet Take 1 tablet (25 mg total) by mouth daily. 12/20/21  Yes Masters, Joellen Jersey, DO  ?sodium chloride (OCEAN) 0.65 % SOLN nasal spray Place 1 spray into both nostrils as needed for congestion. 12/20/21  Yes Masters, Joellen Jersey, DO  ? ? ?Social History  ? ?Socioeconomic History  ? Marital status: Single  ?  Spouse name: Not on file  ? Number of children: Not on file  ? Years of  education: Not on file  ? Highest education level: Not on file  ?Occupational History  ? Not on file  ?Tobacco Use  ? Smoking status: Every Day  ?  Packs/day: 0.30  ?  Types: Cigarettes  ?  Start date: 12  ? Smokeless tobacco: Not on file  ?Substance and Sexual Activity  ? Alcohol use: Not Currently  ?  Alcohol/week: 12.0 standard drinks  ?  Types: 12 Cans of beer per week  ?  Comment: stopped heavy drinking 2021  ? Drug use: Yes  ?  Types: Marijuana  ? Sexual activity: Not on file  ?Other Topics Concern  ? Not on file  ?Social History Narrative  ? Not on file  ? ?Social Determinants of Health  ? ?Financial Resource Strain: Not on file  ?Food Insecurity: Not on file  ?Transportation Needs: Not on file  ?Physical Activity: Not on file  ?Stress: Not on file  ?Social Connections: Not on file  ?Intimate Partner Violence: Not on file  ? ? ?Family History  ?Problem Relation Age of Onset  ? Hypertension Mother   ? Cervical cancer Mother   ? Breast cancer Sister   ? Sudden Cardiac Death Brother 37  ? Heart disease Maternal Grandmother   ? Cancer Maternal Grandmother   ? ? ?ROS: [x]  Positive   [ ]  Negative   [ ]  All sytems reviewed and are negative ?Cardiac: ?[x]  HTN ?  ?Vascular: ?[]  pain in legs while walking ?[]  pain in legs at rest ?[]  pain in legs at night ?[]  non-healing ulcers ?[]  hx of DVT ?[]  swelling in legs ? ?Pulmonary: ?[]  asthma/wheezing ?[]  home O2 ? ?Neurologic: ?[]  hx of CVA []  mini stroke ? ? ?Hematologic: ?[]  hx of cancer ? ?Endocrine:  ? []  diabetes []  thyroid disease ? ?GI ?[]  GERD ? ?GU: ?[x]  CKD/renal failure [x]  HD--[]  M/W/F or []  T/T/S ? ?Psychiatric: ?[]  anxiety ?[]  depression ? ?Musculoskeletal: ?[]  arthritis ?[]  joint pain ? ?Integumentary: ?[]  rashes []  ulcers ? ?Constitutional: ?[]  fever  ?[]  chills ?[x]  N/V ? ?Physical Examination ? ?Vitals:  ? 12/31/21 0509 12/31/21 0857  ?BP: (!) 162/109 (!) 168/113  ?Pulse: 71 76  ?Resp: 18 18  ?Temp: 97.9 ?F (36.6 ?C) (!) 97.4 ?F (36.3 ?C)  ?SpO2: 99% 97%   ? ?Body mass index is 25.72 kg/m?. ? ?General:  WDWN in NAD ?Gait: Not observed ?HENT: WNL, normocephalic ?Pulmonary: normal non-labored breathing ?Cardiac: regular ?Skin: without rashes ?Vascular Exam/Pulses: ? Right Left  ?Radial 2+ (normal) 2+ (normal)  ? ?Extremities: without ischemic changes, without Gangrene , without cellulitis; without open wounds; IV right arm ?Musculoskeletal: no muscle wasting or atrophy  ?Neurologic: A&O X 3; speech is fluent/normal ?Psychiatric:  The  pt has Normal affect. ? ? ?CBC ?   ?Component Value Date/Time  ? WBC 7.3 12/31/2021 0831  ? RBC 2.88 (L) 12/31/2021 0831  ? HGB 7.7 (L) 12/31/2021 0831  ? HGB 7.9 (L) 12/14/2021 1053  ? HCT 23.9 (L) 12/31/2021 0831  ? HCT 25.0 (L) 12/14/2021 1053  ? PLT 132 (L) 12/31/2021 0831  ? PLT 214 12/14/2021 1053  ? MCV 83.0 12/31/2021 0831  ? MCV 82 12/14/2021 1053  ? MCH 26.7 12/31/2021 0831  ? MCHC 32.2 12/31/2021 0831  ? RDW 24.0 (H) 12/31/2021 0831  ? RDW 17.4 (H) 12/14/2021 1053  ? LYMPHSABS 0.5 (L) 12/24/2021 1130  ? MONOABS 0.5 12/24/2021 1130  ? EOSABS 0.0 12/24/2021 1130  ? BASOSABS 0.0 12/24/2021 1130  ? ? ?BMET ?   ?Component Value Date/Time  ? NA 134 (L) 12/30/2021 0400  ? NA 135 12/14/2021 1053  ? K 3.8 12/30/2021 0400  ? CL 95 (L) 12/30/2021 0400  ? CO2 23 12/30/2021 0400  ? GLUCOSE 92 12/30/2021 0400  ? BUN 19 12/30/2021 0400  ? BUN 95 (HH) 12/14/2021 1053  ? CREATININE 6.61 (H) 12/30/2021 0400  ? CALCIUM 8.9 12/30/2021 0400  ? GFRNONAA 10 (L) 12/30/2021 0400  ? ? ?COAGS: ?Lab Results  ?Component Value Date  ? INR 1.2 12/27/2021  ? INR 1.2 11/30/2021  ? INR 1.1 10/10/2021  ? ? ? ?Non-Invasive Vascular Imaging:   ?Vein mapping ordered ? ? ?ASSESSMENT/PLAN: This is a 46 y.o. male admitted with uremia with progressive CKD now on HD in need of dialysis access.   He had TDC placed on 12/27/2021 by interventional radiology.  ? ?-pt now on HD.  He is right hand dominant.   ?-discussed with pt fistula vs graft and hopeful for fistula if veins  are adequate.  Discussed that access will possibly need intervention in the future or eventually fail and need new access.  He expressed understanding.  Discussed with him not to receive any needle sticks in the

## 2021-12-31 NOTE — Progress Notes (Signed)
? ?HD#8 ?SUBJECTIVE:  ?Patient Summary: Roy Randolph is a 46 year old male with past medical history of anxiety with panic attacks newly treated with Zoloft, hypertension, new diagnosis of CKD stage IV (January 2023), pneumothorax in his 72s (was a thin smoker at that time) who presented from nephrology office where he was following up for new diagnosis of CKD stage IV where he began to feel severely ill, like he was "going to die."and admitted for CKD stage 4 now progressed to ESRD and symptomatic anemia. ? ?Overnight Events: None ? ?Interim History:  ?Patient seen and evaluated at bedside this AM. Reports sensation nof chest tightness which began approximately 40 minutes prior to bedside evaluation dialysis. He also reports tightness in his chest causes him to catch his breath. He reports chest tightness is centrally located, does not radiate. He denies any nausea, vomiting. He has not had the opportunity to get out of bed yet, so he reports no associated worsening with exertion. He states symptoms feel similar to previous panic attacks.  ? ?OBJECTIVE:  ?Vital Signs: ?Vitals:  ? 01/01/22 1028 01/01/22 1055 01/01/22 1107 01/01/22 1109  ?BP: (!) 180/102   (!) 183/110  ?Pulse: 72   84  ?Resp: (!) 21  19   ?Temp: 98.3 ?F (36.8 ?C)  98.2 ?F (36.8 ?C)   ?TempSrc: Oral     ?SpO2: 94%   97%  ?Weight:  75.7 kg  73 kg  ?Height:  5\' 9"  (1.753 m)  5\' 9"  (1.753 m)  ? ?Supplemental O2: Room Air ?SpO2: 97 % ?O2 Flow Rate (L/min): 3 L/min ? ?Filed Weights  ? 01/01/22 0640 01/01/22 1055 01/01/22 1109  ?Weight: 75.7 kg 75.7 kg 73 kg  ? ? ? ?Intake/Output Summary (Last 24 hours) at 01/01/2022 1112 ?Last data filed at 01/01/2022 1028 ?Gross per 24 hour  ?Intake 840 ml  ?Output 2000 ml  ?Net -1160 ml  ? ?Net IO Since Admission: -3,680.9 mL [01/01/22 1112] ? ?Physical Exam: ?Physical Exam ?Constitutional:   ?   Comments: Ill-appearing gentleman, resting comfortably in bed, no acute distress  ?HENT:  ?   Head: Normocephalic and  atraumatic.  ?Cardiovascular:  ?   Rate and Rhythm: Normal rate and regular rhythm.  ?   Pulses: Normal pulses.  ?   Heart sounds: Normal heart sounds. No murmur heard. ?  No friction rub. No gallop.  ?Pulmonary:  ?   Effort: Pulmonary effort is normal.  ?   Breath sounds: Normal breath sounds. No wheezing or rales.  ?Abdominal:  ?   General: Abdomen is flat. Bowel sounds are normal.  ?   Tenderness: There is no abdominal tenderness.  ?Neurological:  ?   Mental Status: He is oriented to person, place, and time.  ?Psychiatric:     ?   Mood and Affect: Mood normal.     ?   Behavior: Behavior normal.  ?  ? ?Patient Lines/Drains/Airways Status   ? ? Active Line/Drains/Airways   ? ? Name Placement date Placement time Site Days  ? Peripheral IV 12/24/21 20 G Anterior;Distal;Right;Upper Arm 12/24/21  --  Arm  4  ? Hemodialysis Catheter Right Internal jugular Double lumen Permanent (Tunneled) 12/27/21  0917  Internal jugular  1  ? ?  ?  ? ?  ? ? ? ?ASSESSMENT/PLAN:  ?Assessment: ?Principal Problem: ?  ESRD needing dialysis ([place]) ?Active Problems: ?  Thrombocytopenia ([place]) ?  Essential hypertension ?  Uremia ?  Anemia of chronic kidney failure, stage 5 (  [place]) ? ? ?Plan: ?ESRD with need for HD ?Anion Gap Metabolic Acidosis ?Blood pressure elevated at 161 systolic this AM, but otherwise stable. Uremia had improved with dialysis.  Patient has not noticed any oozing or clots. ?- Nephrology consulted, appreciate their assistance ?-TTSa schedule ?-Renal navigator on board to help with outpatient dispo concerns. He has been accepted at [facility] ?- Avoid nephrotoxic agents ?- Plan for him to gain access with either fistula vs graft during this admission. Vein mapping has been completed. Per VS, appears to have suitable cephalic and basilic veins in his non-dominant left upper extremity. Plan left arm likely avf possibly avg today in the OR. ?  ? ?Microcytic anemia ?- likely related to slow oozing 2/2 uremia and chronic renal  disease. ?S/P 2 units of blood, improvement in his Hgb, 8.0. Hgb has stayed stable with slight downtrend 7.6 this AM. ?-S/p ferric gluconate x4, last dose 3/31 ?-Continue ESA per nephrology ?-Trend daily H+H ?- Consent will need to be obtained before each transfusion.  ?  ?Hypertension ?Blood pressure elevated secondary to volume overload, though he is not showing much evidence of edema. He is on multidrug regimen (norvasc 10, clonidine, and labetalol) ?- Clonidine increased by nephrology due to persistently elevated BP. It has improved some but remains hypertensive. ?-Continue home amlodipine 10 mg daily, clonidine 0.3 mg TID, labetalol 400 mg twice daily for now. ?-Continuing to monitor  ? ?Acute hypoxic respiratory failure ?Community acquired pneumonia ?Currently saturating well on room air. Lungs are clear. He does report some chest tightness. Says symptoms feel similar to prior anxiety. Low concern for ACS or other acute pathology at this point. He is resting in bed comfortably and appears clinically the same. Hemodynamically stable with normal heart and lung souunds. ?-Maintain O2 supplementation with SpO2 goal greater than 93% ?  ?Hx anxiety with panic attacks ?-Continue Zoloft 25 mg daily ?-Xanax discontinued ? ?Best Practice: ?Diet: Renal diet ?IVF: None ?VTE: Place and maintain sequential compression device Start: 12/25/21 0714 ?Code: Full ?AB: None ?Therapy Recs: None ?Family Contact: Tawana, called and notified. ?DISPO: Anticipated discharge in 1-2 days to Home pending  outpatient HD bed and medical stability . ? ?Signature: ?Delene Ruffini MD ?[facility], PGY-1 ?01/01/2022,11:12 AM ?Please contact the on call pager after 5 pm and on weekends at [phone]. ? ?

## 2021-12-31 NOTE — H&P (View-Only) (Signed)
?Hospital Consult ? ? ? ?Reason for Consult:  in need of dialysis access ?Requesting Physician:  Moshe Cipro ?MRN #:  [id number] ? ?History of Present Illness: This is a 46 y.o. male who presented to the hospital with SOB, N/V and unintentional weight loss.  He has hx of CKD IV that was recently diagnosed but was not willing to have access at that time.  VVS is consulted for HD access.   ? ?He has hx of severe HTN.  During his last admission, he was found to have anemia and thrombocytopenia.  His platelet count today is 132k.   ? ?Pt had TDC placed 12/27/2021 by IR.  ? ?Pt is right hand dominant.   ? ?He denies any chest pain or hx of stroke.  He denies any claudication or non healing wounds.   ? ?The pt is not on a statin for cholesterol management.  ?The pt is not on a daily aspirin.   Other AC:  none ?The pt is on CCB, BB, clonidine for hypertension.   ?The pt is not diabetic.   ?Tobacco hx:  he recently quit smoking ? ?Past Medical History:  ?Diagnosis Date  ? Abnormal EKG 10/10/2021  ? Acute renal failure superimposed on stage 4 chronic kidney disease ([place]) 10/10/2021  ? Alcohol use   ? Alcohol use disorder in remission 11/14/2021  ? Elevated troponin 10/10/2021  ? Epigastric pain 10/10/2021  ? H/O alcohol abuse 10/10/2021  ? History of migraine 11/14/2021  ? Hypertension   ? Hypertensive emergency due to non compliance 10/10/2021  ? Hypokalemia 10/10/2021  ? Left arm numbness and pain 10/10/2021  ? Marijuana use 10/10/2021  ? Noncompliance   ? NSAID long-term use 10/10/2021  ? PRES (posterior reversible encephalopathy syndrome)   ? Smoker 10/10/2021  ? Smoking   ? Syncope, vasovagal 10/10/2021  ? Thrombocytopenia ([place]) 10/10/2021  ? Vision disturbance 11/14/2021  ? Vomiting 10/10/2021  ? ? ?Past Surgical History:  ?Procedure Laterality Date  ? IR FLUORO GUIDE CV LINE RIGHT  12/24/2021  ? IR FLUORO GUIDE CV LINE RIGHT  12/27/2021  ? IR US GUIDE VASC ACCESS RIGHT  12/24/2021  ? THORACOTOMY Right   ? ? ?Allergies  ?Allergen  Reactions  ? Cucumber Extract Anaphylaxis and Itching  ? Other Anaphylaxis, Itching, Swelling and Other (See Comments)  ?  The patient CANNOT eat raw/fresh fruit or vegetables. Reaction starts with ears itching, then progresses from there. He can eat canned versions of these foods, however.  ? Tomato (Diagnostic) Anaphylaxis and Swelling  ? Wild Lettuce [Wild Lettuce Extract (Lactuca Virosa)] Anaphylaxis and Swelling  ? Hydralazine Hives, Itching and Other (See Comments)  ?  SEVERE ITCHING  ? ? ?Prior to Admission medications   ?Medication Sig Start Date End Date Taking? Authorizing Provider  ?acetaminophen (TYLENOL) 325 MG tablet Take 325-650 mg by mouth every 6 (six) hours as needed for mild pain or headache.   Yes [provider]  ?amLODipine (NORVASC) 10 MG tablet Take 1 tablet (10 mg total) by mouth daily. 12/14/21  Yes Masters, Joellen Jersey, DO  ?Ascorbic Acid (VITAMIN C PO) Take 1 tablet by mouth daily.   Yes [provider]  ?cloNIDine (CATAPRES) 0.2 MG tablet Take 1 tablet (0.2 mg total) by mouth 2 (two) times daily. 12/20/21  Yes Masters, Katie, DO  ?labetalol (NORMODYNE) 300 MG tablet Take 1 tablet (300 mg total) by mouth 2 (two) times daily. 12/14/21  Yes Masters, Joellen Jersey, DO  ?Multiple Vitamins-Minerals (CERTAVITE/ANTIOXIDANTS) TABS Take  1 tablet by mouth at bedtime. 12/07/21  Yes Ghimire, Henreitta Leber, MD  ?pantoprazole (PROTONIX) 40 MG tablet Take 1 tablet (40 mg total) by mouth daily. 12/07/21  Yes Ghimire, Henreitta Leber, MD  ?Potassium 95 MG TABS Take 95 mg by mouth daily.   Yes [provider]  ?sertraline (ZOLOFT) 25 MG tablet Take 1 tablet (25 mg total) by mouth daily. 12/20/21  Yes Masters, Joellen Jersey, DO  ?sodium chloride (OCEAN) 0.65 % SOLN nasal spray Place 1 spray into both nostrils as needed for congestion. 12/20/21  Yes Masters, Joellen Jersey, DO  ? ? ?Social History  ? ?Socioeconomic History  ? Marital status: Single  ?  Spouse name: Not on file  ? Number of children: Not on file  ? Years of  education: Not on file  ? Highest education level: Not on file  ?Occupational History  ? Not on file  ?Tobacco Use  ? Smoking status: Every Day  ?  Packs/day: 0.30  ?  Types: Cigarettes  ?  Start date: 28  ? Smokeless tobacco: Not on file  ?Substance and Sexual Activity  ? Alcohol use: Not Currently  ?  Alcohol/week: 12.0 standard drinks  ?  Types: 12 Cans of beer per week  ?  Comment: stopped heavy drinking 2021  ? Drug use: Yes  ?  Types: Marijuana  ? Sexual activity: Not on file  ?Other Topics Concern  ? Not on file  ?Social History Narrative  ? Not on file  ? ?Social Determinants of Health  ? ?Financial Resource Strain: Not on file  ?Food Insecurity: Not on file  ?Transportation Needs: Not on file  ?Physical Activity: Not on file  ?Stress: Not on file  ?Social Connections: Not on file  ?Intimate Partner Violence: Not on file  ? ? ?Family History  ?Problem Relation Age of Onset  ? Hypertension Mother   ? Cervical cancer Mother   ? Breast cancer Sister   ? Sudden Cardiac Death Brother 62  ? Heart disease Maternal Grandmother   ? Cancer Maternal Grandmother   ? ? ?ROS: [x]  Positive   [ ]  Negative   [ ]  All sytems reviewed and are negative ?Cardiac: ?[x]  HTN ?  ?Vascular: ?[]  pain in legs while walking ?[]  pain in legs at rest ?[]  pain in legs at night ?[]  non-healing ulcers ?[]  hx of DVT ?[]  swelling in legs ? ?Pulmonary: ?[]  asthma/wheezing ?[]  home O2 ? ?Neurologic: ?[]  hx of CVA []  mini stroke ? ? ?Hematologic: ?[]  hx of cancer ? ?Endocrine:  ? []  diabetes []  thyroid disease ? ?GI ?[]  GERD ? ?GU: ?[x]  CKD/renal failure [x]  HD--[]  M/W/F or []  T/T/S ? ?Psychiatric: ?[]  anxiety ?[]  depression ? ?Musculoskeletal: ?[]  arthritis ?[]  joint pain ? ?Integumentary: ?[]  rashes []  ulcers ? ?Constitutional: ?[]  fever  ?[]  chills ?[x]  N/V ? ?Physical Examination ? ?Vitals:  ? 12/31/21 0509 12/31/21 0857  ?BP: (!) 162/109 (!) 168/113  ?Pulse: 71 76  ?Resp: 18 18  ?Temp: 97.9 ?F (36.6 ?C) (!) 97.4 ?F (36.3 ?C)  ?SpO2: 99% 97%   ? ?Body mass index is 25.72 kg/m?. ? ?General:  WDWN in NAD ?Gait: Not observed ?HENT: WNL, normocephalic ?Pulmonary: normal non-labored breathing ?Cardiac: regular ?Skin: without rashes ?Vascular Exam/Pulses: ? Right Left  ?Radial 2+ (normal) 2+ (normal)  ? ?Extremities: without ischemic changes, without Gangrene , without cellulitis; without open wounds; IV right arm ?Musculoskeletal: no muscle wasting or atrophy  ?Neurologic: A&O X 3; speech is fluent/normal ?Psychiatric:  The  pt has Normal affect. ? ? ?CBC ?   ?Component Value Date/Time  ? WBC 7.3 12/31/2021 0831  ? RBC 2.88 (L) 12/31/2021 0831  ? HGB 7.7 (L) 12/31/2021 0831  ? HGB 7.9 (L) 12/14/2021 1053  ? HCT 23.9 (L) 12/31/2021 0831  ? HCT 25.0 (L) 12/14/2021 1053  ? PLT 132 (L) 12/31/2021 0831  ? PLT 214 12/14/2021 1053  ? MCV 83.0 12/31/2021 0831  ? MCV 82 12/14/2021 1053  ? MCH 26.7 12/31/2021 0831  ? MCHC 32.2 12/31/2021 0831  ? RDW 24.0 (H) 12/31/2021 0831  ? RDW 17.4 (H) 12/14/2021 1053  ? LYMPHSABS 0.5 (L) 12/24/2021 1130  ? MONOABS 0.5 12/24/2021 1130  ? EOSABS 0.0 12/24/2021 1130  ? BASOSABS 0.0 12/24/2021 1130  ? ? ?BMET ?   ?Component Value Date/Time  ? NA 134 (L) 12/30/2021 0400  ? NA 135 12/14/2021 1053  ? K 3.8 12/30/2021 0400  ? CL 95 (L) 12/30/2021 0400  ? CO2 23 12/30/2021 0400  ? GLUCOSE 92 12/30/2021 0400  ? BUN 19 12/30/2021 0400  ? BUN 95 (HH) 12/14/2021 1053  ? CREATININE 6.61 (H) 12/30/2021 0400  ? CALCIUM 8.9 12/30/2021 0400  ? GFRNONAA 10 (L) 12/30/2021 0400  ? ? ?COAGS: ?Lab Results  ?Component Value Date  ? INR 1.2 12/27/2021  ? INR 1.2 11/30/2021  ? INR 1.1 10/10/2021  ? ? ? ?Non-Invasive Vascular Imaging:   ?Vein mapping ordered ? ? ?ASSESSMENT/PLAN: This is a 47 y.o. male admitted with uremia with progressive CKD now on HD in need of dialysis access.   He had TDC placed on 12/27/2021 by interventional radiology.  ? ?-pt now on HD.  He is right hand dominant.   ?-discussed with pt fistula vs graft and hopeful for fistula if veins  are adequate.  Discussed that access will possibly need intervention in the future or eventually fail and need new access.  He expressed understanding.  Discussed with him not to receive any needle sticks in the

## 2021-12-31 NOTE — TOC Progression Note (Signed)
Transition of Care (TOC) - Progression Note  ? ? ?Patient Details  ?Name: Roy Randolph ?MRN: [id number] ?Date of Birth: 1976/07/16 ? ?Transition of Care (TOC) CM/SW Contact  ?Tom-Johnson, Renea Ee, RN ?Phone Number: ?12/31/2021, 3:06 PM ? ?Clinical Narrative:    ? ?Patient had upper extremity vein mapping today. CM will continue to follow with needs.  ? ?Expected Discharge Plan: Home/Self Care ?Barriers to Discharge: Continued Medical Work up ? ?Expected Discharge Plan and Services ?Expected Discharge Plan: Home/Self Care ?  ?Discharge Planning Services: CM Consult ?Post Acute Care Choice: NA ?Living arrangements for the past 2 months: [facility] ?                ?DME Arranged: N/A ?DME Agency: NA ?  ?  ?  ?HH Arranged: NA ?[facility] Agency: NA ?  ?  ?  ? ? ?Social Determinants of Health (SDOH) Interventions ?  ? ?Readmission Risk Interventions ? ?  12/28/2021  ?  1:23 PM  ?Readmission Risk Prevention Plan  ?Transportation Screening Complete  ?PCP or Specialist Appt within 5-7 Days Complete  ?Home Care Screening Complete  ?Medication Review (RN CM) Complete  ? ? ?

## 2021-12-31 NOTE — Plan of Care (Signed)
?  Problem: Education: ?Goal: Knowledge of General Education information will improve ?Description: Including pain rating scale, medication(s)/side effects and non-pharmacologic comfort measures ?Outcome: Completed/Met ?  ?Problem: Clinical Measurements: ?Goal: Ability to maintain clinical measurements within normal limits will improve ?Outcome: Completed/Met ?Goal: Will remain free from infection ?Outcome: Completed/Met ?Goal: Diagnostic test results will improve ?Outcome: Completed/Met ?Goal: Respiratory complications will improve ?Outcome: Completed/Met ?  ?Problem: Activity: ?Goal: Risk for activity intolerance will decrease ?Outcome: Completed/Met ?  ?Problem: Education: ?Goal: Knowledge of disease and its progression will improve ?Outcome: Completed/Met ?  ?Problem: Health Behavior/Discharge Planning: ?Goal: Ability to manage health-related needs will improve ?Outcome: Completed/Met ?  ?Problem: Clinical Measurements: ?Goal: Complications related to the disease process or treatment will be avoided or minimized ?Outcome: Completed/Met ?Goal: Dialysis access will remain free of complications ?Outcome: Completed/Met ?  ?Problem: Activity: ?Goal: Activity intolerance will improve ?Outcome: Completed/Met ?  ?Problem: Fluid Volume: ?Goal: Fluid volume balance will be maintained or improved ?Outcome: Completed/Met ?  ?Problem: Nutritional: ?Goal: Ability to make appropriate dietary choices will improve ?Outcome: Completed/Met ?  ?Problem: Respiratory: ?Goal: Respiratory symptoms related to disease process will be avoided ?Outcome: Completed/Met ?  ?Problem: Self-Concept: ?Goal: Body image disturbance will be avoided or minimized ?Outcome: Completed/Met ?  ?Problem: Urinary Elimination: ?Goal: Progression of disease will be identified and treated ?Outcome: Completed/Met ?  ?

## 2021-12-31 NOTE — Progress Notes (Signed)
Upper extremity vein mapping has been completed.  ? ?Preliminary results in CV Proc.  ? ?Roy Randolph ?12/31/2021 1:47 PM    ?

## 2021-12-31 NOTE — Progress Notes (Signed)
Physical Therapy Treatment ?Patient Details ?Name: Roy Randolph ?MRN: [id number] ?DOB: Feb 04, 1976 ?Today's Date: 12/31/2021 ? ? ?History of Present Illness 46 y/o male presented to ED on 12/24/21 from [facility] d/t HTN and coughing up blood x 5 days. Admitted for ESRD with need for HD. CXR conered for pneumonia in L and R lobe. PMH: CKD stage IV, HTN ? ?  ?PT Comments  ? ? Continuing work on functional mobility and activity tolerance;  Session focused on progressive hallway amb, with close monitor of O2 sats and activity tolerance; Shows very good self-monitor and awareness of deficits; Managing independently in the room; Recommend pt walk the hallways independently daily -- discussed hand hygiene exiting and entering room, and I asked pt to request a staff member bring him the vitals machine while he walks, so that he can watch his O2 sats; Educated pt in recognizing a good/poor pleth wave, and repositioning probe on finger to get a better wave and an O2 sat reading we can trust; Overall doing much better, and he has met 3/4 acute PT goals -- will plan on looking at stairs next session;  ? ?Continue to agree with Outpt Cardiopulmonary Rehab (is Cardiac Rehab Phase 2 an option for him?)  ?Recommendations for follow up therapy are one component of a multi-disciplinary discharge planning process, led by the attending physician.  Recommendations may be updated based on patient status, additional functional criteria and insurance authorization. ? ?Follow Up Recommendations ? No PT follow up (would benefit from cardiac/pulmonary rehab) ?  ?  ?Assistance Recommended at Discharge PRN  ?Patient can return home with the following   ?  ?Equipment Recommendations ? None recommended by PT  ?  ?Recommendations for Other Services   ? ? ?  ?Precautions / Restrictions Precautions ?Precautions: Other (comment) ?Precaution Comments: watch O2; monitor for DOE ?Restrictions ?Weight Bearing Restrictions: No  ?  ? ?Mobility ? Bed  Mobility ?Overal bed mobility: Modified Independent ?Bed Mobility: Supine to Sit, Sit to Supine ?  ?  ?  ?  ?  ?General bed mobility comments: No difficulty ?  ? ?Transfers ?Overall transfer level: Needs assistance ?Equipment used: None ?Transfers: Sit to/from Stand ?Sit to Stand: Supervision ?  ?  ?  ?  ?  ?General transfer comment: supervision for safety; managing well, Progressed to independence ?  ? ?Ambulation/Gait ?Ambulation/Gait assistance: Supervision ?Gait Distance (Feet): 300 Feet ?Assistive device: None (and pushing vitals machine back to his room) ?Gait Pattern/deviations: Step-through pattern, Decreased stride length ?Gait velocity: decreased ?  ?  ?General Gait Details: Cues to self-monitor for activity tolerance; Occasionally noted low O2 sat reading on vitals machine, was concurrent with poor pleth wave, and unreliable reading; O2 sats were at or above 94% when good pleth wave ? ? ?Stairs ?  ?  ?  ?  ?  ? ? ?Wheelchair Mobility ?  ? ?Modified Rankin (Stroke Patients Only) ?  ? ? ?  ?Balance Overall balance assessment: Mild deficits observed, not formally tested ?  ?  ?  ?  ?  ?  ?  ?  ?  ?  ?  ?  ?  ?  ?  ?  ?  ?  ?  ? ?  ?Cognition Arousal/Alertness: Awake/alert ?Behavior During Therapy: [facility] for tasks assessed/performed ?Overall Cognitive Status: Within Functional Limits for tasks assessed ?  ?  ?  ?  ?  ?  ?  ?  ?  ?  ?  ?  ?  ?  ?  ?  ?  General Comments: very aware of deficits and good safety awareness. Chose to stop driving due to visual deficits and wanting to keep himself and everyone on the road safe ?  ?  ? ?  ?Exercises   ? ?  ?General Comments General comments (skin integrity, edema, etc.): Reports feeling much better today; We discussed self-monitoring for activity tolerance; pt states he is familiar with his body's signs of presyncope, and reports he sits down and gets stable to prevent falling ?  ?  ? ?Pertinent Vitals/Pain Pain Assessment ?Pain Assessment: No/denies pain  ? ? ?Home  Living   ?  ?  ?  ?  ?  ?  ?  ?  ?  ?   ?  ?Prior Function    ?  ?  ?   ? ?PT Goals (current goals can now be found in the care plan section) Acute Rehab PT Goals ?Patient Stated Goal: to feel better ?PT Goal Formulation: With patient ?Time For Goal Achievement: 01/10/22 ?Potential to Achieve Goals: Good ?Progress towards PT goals: Progressing toward goals (Met 3/4 goals; flight of stairs left) ? ?  ?Frequency ? ? ? Min 3X/week ? ? ? ?  ?PT Plan Current plan remains appropriate  ? ? ?Co-evaluation   ?  ?  ?  ?  ? ?  ?AM-PAC PT "6 Clicks" Mobility   ?Outcome Measure ? Help needed turning from your back to your side while in a flat bed without using bedrails?: None ?Help needed moving from lying on your back to sitting on the side of a flat bed without using bedrails?: None ?Help needed moving to and from a bed to a chair (including a wheelchair)?: None ?Help needed standing up from a chair using your arms (e.g., wheelchair or bedside chair)?: None ?Help needed to walk in hospital room?: None ?Help needed climbing 3-5 steps with a railing? : A Little ?6 Click Score: 23 ? ?  ?End of Session Equipment Utilized During Treatment: Other (comment) (vitals machine) ?Activity Tolerance: Patient tolerated treatment well ?Patient left: in bed;with call bell/phone within reach;Other (comment) (setup recliner for later if he wants to sit OOB) ?Nurse Communication: Mobility status ?PT Visit Diagnosis: Unsteadiness on feet (R26.81);Muscle weakness (generalized) (M62.81) ?  ? ? ?Time: 4174-0814 ?PT Time Calculation (min) (ACUTE ONLY): 21 min ? ?Charges:  $Gait Training: 8-22 mins          ?          ? ?Roney Marion, PT  ?Acute Rehabilitation Services ?Pager [phone] ?Office [phone] ? ? ? ?Colletta Maryland ?12/31/2021, 11:33 AM ? ?

## 2022-01-01 ENCOUNTER — Encounter (HOSPITAL_COMMUNITY): Payer: Self-pay | Admitting: Internal Medicine

## 2022-01-01 ENCOUNTER — Inpatient Hospital Stay (HOSPITAL_COMMUNITY): Payer: Medicaid Other | Admitting: Anesthesiology

## 2022-01-01 ENCOUNTER — Encounter (HOSPITAL_COMMUNITY)
Admission: EM | Disposition: A | Discharge status: Home / Self Care | Payer: Self-pay | Source: Home / Self Care | Attending: Internal Medicine

## 2022-01-01 ENCOUNTER — Encounter: Payer: Self-pay | Admitting: Internal Medicine

## 2022-01-01 ENCOUNTER — Other Ambulatory Visit: Payer: Self-pay

## 2022-01-01 DIAGNOSIS — I120 Hypertensive chronic kidney disease with stage 5 chronic kidney disease or end stage renal disease: Secondary | ICD-10-CM

## 2022-01-01 DIAGNOSIS — F419 Anxiety disorder, unspecified: Secondary | ICD-10-CM

## 2022-01-01 DIAGNOSIS — D631 Anemia in chronic kidney disease: Secondary | ICD-10-CM

## 2022-01-01 DIAGNOSIS — N186 End stage renal disease: Secondary | ICD-10-CM

## 2022-01-01 HISTORY — PX: AV FISTULA PLACEMENT: SHX1204

## 2022-01-01 LAB — POCT I-STAT, CHEM 8
BUN: 14 mg/dL (ref 6–20)
Calcium, Ion: 1.07 mmol/L — ABNORMAL LOW (ref 1.15–1.40)
Chloride: 97 mmol/L — ABNORMAL LOW (ref 98–111)
Creatinine, Ser: 5.7 mg/dL — ABNORMAL HIGH (ref 0.61–1.24)
Glucose, Bld: 94 mg/dL (ref 70–99)
HCT: 28 % — ABNORMAL LOW (ref 39.0–52.0)
Hemoglobin: 9.5 g/dL — ABNORMAL LOW (ref 13.0–17.0)
Potassium: 4 mmol/L (ref 3.5–5.1)
Sodium: 134 mmol/L — ABNORMAL LOW (ref 135–145)
TCO2: 28 mmol/L (ref 22–32)

## 2022-01-01 LAB — SURGICAL PCR SCREEN
MRSA, PCR: NEGATIVE
Staphylococcus aureus: NEGATIVE

## 2022-01-01 LAB — RENAL FUNCTION PANEL
Albumin: 2.3 g/dL — ABNORMAL LOW (ref 3.5–5.0)
Anion gap: 12 (ref 5–15)
BUN: 38 mg/dL — ABNORMAL HIGH (ref 6–20)
CO2: 24 mmol/L (ref 22–32)
Calcium: 8.4 mg/dL — ABNORMAL LOW (ref 8.9–10.3)
Chloride: 95 mmol/L — ABNORMAL LOW (ref 98–111)
Creatinine, Ser: 12.4 mg/dL — ABNORMAL HIGH (ref 0.61–1.24)
GFR, Estimated: 5 mL/min — ABNORMAL LOW (ref >60)
Glucose, Bld: 102 mg/dL — ABNORMAL HIGH (ref 70–99)
Phosphorus: 5.5 mg/dL — ABNORMAL HIGH (ref 2.5–4.6)
Potassium: 4 mmol/L (ref 3.5–5.1)
Sodium: 131 mmol/L — ABNORMAL LOW (ref 135–145)

## 2022-01-01 LAB — HEMOGLOBIN AND HEMATOCRIT, BLOOD
HCT: 23 % — ABNORMAL LOW (ref 39.0–52.0)
Hemoglobin: 7.6 g/dL — ABNORMAL LOW (ref 13.0–17.0)

## 2022-01-01 SURGERY — ARTERIOVENOUS (AV) FISTULA CREATION
Anesthesia: General | Site: Arm Upper | Laterality: Left

## 2022-01-01 MED ORDER — SODIUM CHLORIDE 0.9 % IV SOLN: INTRAVENOUS | Status: DC

## 2022-01-01 MED ORDER — HEPARIN SODIUM (PORCINE) 1000 UNIT/ML IJ SOLN
INTRAMUSCULAR | Status: AC
  Administered 2022-01-01: 3200 [IU]
  Filled 2022-01-01: qty 4

## 2022-01-01 MED ORDER — PROPOFOL 500 MG/50ML IV EMUL
INTRAVENOUS | Status: DC | PRN
  Administered 2022-01-01: 25 ug/kg/min via INTRAVENOUS

## 2022-01-01 MED ORDER — OXYCODONE HCL 5 MG/5ML PO SOLN: 5.0000 mg | Freq: Once | ORAL | Status: DC | PRN

## 2022-01-01 MED ORDER — LABETALOL HCL 5 MG/ML IV SOLN
5.0000 mg | INTRAVENOUS | Status: AC | PRN
  Administered 2022-01-01 (×4): 5 mg via INTRAVENOUS

## 2022-01-01 MED ORDER — HEPARIN SODIUM (PORCINE) 1000 UNIT/ML IJ SOLN: 3200.0000 [IU] | INTRAMUSCULAR | Status: DC | PRN

## 2022-01-01 MED ORDER — LABETALOL HCL 5 MG/ML IV SOLN
INTRAVENOUS | Status: AC
  Filled 2022-01-01: qty 4

## 2022-01-01 MED ORDER — HYDROCODONE-ACETAMINOPHEN 5-325 MG PO TABS
1.0000 | ORAL_TABLET | ORAL | Status: DC | PRN
  Administered 2022-01-01 – 2022-01-02 (×3): 2 via ORAL
  Filled 2022-01-01 (×4): qty 2

## 2022-01-01 MED ORDER — CHLORHEXIDINE GLUCONATE 0.12 % MT SOLN
15.0000 mL | OROMUCOSAL | Status: AC
  Administered 2022-01-01: 15 mL via OROMUCOSAL
  Filled 2022-01-01 (×2): qty 15

## 2022-01-01 MED ORDER — MIDAZOLAM HCL 2 MG/2ML IJ SOLN
INTRAMUSCULAR | Status: DC | PRN
  Administered 2022-01-01: 2 mg via INTRAVENOUS

## 2022-01-01 MED ORDER — CEFAZOLIN SODIUM 1 G IJ SOLR
INTRAMUSCULAR | Status: AC
  Filled 2022-01-01: qty 20

## 2022-01-01 MED ORDER — LIDOCAINE-EPINEPHRINE (PF) 1 %-1:200000 IJ SOLN
INTRAMUSCULAR | Status: AC
  Filled 2022-01-01: qty 30

## 2022-01-01 MED ORDER — HEPARIN 6000 UNIT IRRIGATION SOLUTION
Status: AC
  Filled 2022-01-01: qty 500

## 2022-01-01 MED ORDER — FENTANYL CITRATE (PF) 100 MCG/2ML IJ SOLN
INTRAMUSCULAR | Status: AC
Start: 2022-01-01 — End: 2022-01-02
  Filled 2022-01-01: qty 2

## 2022-01-01 MED ORDER — FENTANYL CITRATE (PF) 100 MCG/2ML IJ SOLN
25.0000 ug | INTRAMUSCULAR | Status: DC | PRN
  Administered 2022-01-01 (×2): 50 ug via INTRAVENOUS

## 2022-01-01 MED ORDER — ONDANSETRON HCL 4 MG/2ML IJ SOLN: 4.0000 mg | Freq: Once | INTRAMUSCULAR | Status: DC | PRN

## 2022-01-01 MED ORDER — CEFAZOLIN SODIUM-DEXTROSE 2-3 GM-%(50ML) IV SOLR
INTRAVENOUS | Status: DC | PRN
  Administered 2022-01-01: 2 g via INTRAVENOUS

## 2022-01-01 MED ORDER — LIDOCAINE 2% (20 MG/ML) 5 ML SYRINGE
INTRAMUSCULAR | Status: DC | PRN
  Administered 2022-01-01: 100 mg via INTRAVENOUS

## 2022-01-01 MED ORDER — 0.9 % SODIUM CHLORIDE (POUR BTL) OPTIME
TOPICAL | Status: DC | PRN
  Administered 2022-01-01: 1000 mL

## 2022-01-01 MED ORDER — LIDOCAINE 2% (20 MG/ML) 5 ML SYRINGE
INTRAMUSCULAR | Status: AC
  Filled 2022-01-01: qty 5

## 2022-01-01 MED ORDER — FENTANYL CITRATE (PF) 250 MCG/5ML IJ SOLN
INTRAMUSCULAR | Status: DC | PRN
Start: 2022-01-01 — End: 2022-01-01
  Administered 2022-01-01: 100 ug via INTRAVENOUS

## 2022-01-01 MED ORDER — PROPOFOL 10 MG/ML IV BOLUS
INTRAVENOUS | Status: DC | PRN
  Administered 2022-01-01: 160 mg via INTRAVENOUS

## 2022-01-01 MED ORDER — MIDAZOLAM HCL 2 MG/2ML IJ SOLN
INTRAMUSCULAR | Status: AC
  Filled 2022-01-01: qty 2

## 2022-01-01 MED ORDER — OXYCODONE HCL 5 MG PO TABS: 5.0000 mg | ORAL_TABLET | Freq: Once | ORAL | Status: DC | PRN

## 2022-01-01 MED ORDER — HEPARIN 6000 UNIT IRRIGATION SOLUTION
Status: DC | PRN
  Administered 2022-01-01: 1

## 2022-01-01 MED ORDER — FENTANYL CITRATE (PF) 250 MCG/5ML IJ SOLN
INTRAMUSCULAR | Status: AC
  Filled 2022-01-01: qty 5

## 2022-01-01 SURGICAL SUPPLY — 30 items
ARMBAND PINK RESTRICT EXTREMIT (MISCELLANEOUS) ×2 IMPLANT
BAG COUNTER SPONGE SURGICOUNT (BAG) ×2 IMPLANT
CANISTER SUCT 3000ML PPV (MISCELLANEOUS) ×2 IMPLANT
CLIP LIGATING EXTRA MED SLVR (CLIP) ×2 IMPLANT
CLIP LIGATING EXTRA SM BLUE (MISCELLANEOUS) ×2 IMPLANT
COVER PROBE W GEL 5X96 (DRAPES) ×1 IMPLANT
DERMABOND ADVANCED (GAUZE/BANDAGES/DRESSINGS) ×1
DERMABOND ADVANCED .7 DNX12 (GAUZE/BANDAGES/DRESSINGS) ×1 IMPLANT
ELECT REM PT RETURN 9FT ADLT (ELECTROSURGICAL) ×2
ELECTRODE REM PT RTRN 9FT ADLT (ELECTROSURGICAL) ×1 IMPLANT
GLOVE SURG ENC MOIS LTX SZ7.5 (GLOVE) ×2 IMPLANT
GOWN STRL REUS W/ TWL LRG LVL3 (GOWN DISPOSABLE) ×2 IMPLANT
GOWN STRL REUS W/ TWL XL LVL3 (GOWN DISPOSABLE) ×1 IMPLANT
GOWN STRL REUS W/TWL LRG LVL3 (GOWN DISPOSABLE) ×2
GOWN STRL REUS W/TWL XL LVL3 (GOWN DISPOSABLE) ×1
INSERT FOGARTY SM (MISCELLANEOUS) IMPLANT
KIT BASIN OR (CUSTOM PROCEDURE TRAY) ×2 IMPLANT
KIT TURNOVER KIT B (KITS) ×2 IMPLANT
NS IRRIG 1000ML POUR BTL (IV SOLUTION) ×2 IMPLANT
PACK CV ACCESS (CUSTOM PROCEDURE TRAY) ×2 IMPLANT
PAD ARMBOARD 7.5X6 YLW CONV (MISCELLANEOUS) ×4 IMPLANT
SLING ARM FOAM STRAP LRG (SOFTGOODS) ×1 IMPLANT
SLING ARM FOAM STRAP MED (SOFTGOODS) IMPLANT
SUT MNCRL AB 4-0 PS2 18 (SUTURE) ×2 IMPLANT
SUT PROLENE 6 0 BV (SUTURE) ×2 IMPLANT
SUT VIC AB 3-0 SH 27 (SUTURE) ×1
SUT VIC AB 3-0 SH 27X BRD (SUTURE) ×1 IMPLANT
TOWEL GREEN STERILE (TOWEL DISPOSABLE) ×2 IMPLANT
UNDERPAD 30X36 HEAVY ABSORB (UNDERPADS AND DIAPERS) ×2 IMPLANT
WATER STERILE IRR 1000ML POUR (IV SOLUTION) ×2 IMPLANT

## 2022-01-01 NOTE — Progress Notes (Signed)
Patient stated he would accept blood or blood products during surgery today if needed. Type and screen in place.  ?

## 2022-01-01 NOTE — Telephone Encounter (Signed)
Called patient to try to get him set up with a sleep doctor. Patient is currently in the hospital due to a surgery. Patient states that he will call when he is discharged from the hospital and is set up with dialysis to see one of our sleep docotors. Nothing further needed at this time.  ?

## 2022-01-01 NOTE — Progress Notes (Addendum)
PT Cancellation Note ? ?Patient Details ?Name: Roy Randolph ?MRN: [id number] ?DOB: [birth year] ? ? ?Cancelled Treatment:    Reason Eval/Treat Not Completed: Patient at procedure or test/unavailable. Pt in HD. Pt schedule for OR after HD for AVF vs AVG placement. ? ? ?Lorriane Shire ?01/01/2022, 7:06 AM ? ?Lorrin Goodell, PT  ?Office # [phone] ?Pager [phone] ? ? ?

## 2022-01-01 NOTE — Interval H&P Note (Signed)
History and Physical Interval Note: ? ?01/01/2022 ?11:36 AM ? ?Roy Randolph  has presented today for surgery, with the diagnosis of End Stage Renal Disease.  The various methods of treatment have been discussed with the patient and family. After consideration of risks, benefits and other options for treatment, the patient has consented to  Procedure(s): ?LEFT ARM ARTERIOVENOUS (AV) FISTULA CREATION vs ARTERIOVENOUS GRAFT INSERTION (Left) as a surgical intervention.  The patient's history has been reviewed, patient examined, no change in status, stable for surgery.  I have reviewed the patient's chart and labs.  Questions were answered to the patient's satisfaction.   ? ? ?Servando Snare ? ? ?

## 2022-01-01 NOTE — Progress Notes (Signed)
?  01/01/22 1028  ?Vitals  ?Temp 98.3 ?F (36.8 ?C)  ?Temp Source Oral  ?BP (!) 180/102  ?BP Location Right Arm  ?BP Method Automatic  ?Patient Position (if appropriate) Lying  ?Pulse Rate 72  ?Pulse Rate Source Monitor  ?Resp (!) 21  ?Oxygen Therapy  ?SpO2 94 %  ?O2 Device Room Air  ?Post-Hemodialysis Assessment  ?Rinseback Volume (mL) 250 mL  ?KECN 309 V  ?Dialyzer Clearance Clear  ?Duration of HD Treatment -hour(s) 3.5 hour(s)  ?Hemodialysis Intake (mL) 500 mL  ?UF Total -Machine (mL) 2500 mL  ?Net UF (mL) 2000 mL  ?Tolerated HD Treatment Yes  ?Post-Hemodialysis Comments tx complete, pt stable  ?Hemodialysis Catheter Right Internal jugular Double lumen Permanent (Tunneled)  ?Placement Date/Time: 12/27/21 2956   Placed prior to admission: No  Time Out: Correct patient;Correct site;Correct procedure  Maximum sterile barrier precautions: Hand hygiene;Cap;Mask;Sterile gown;Sterile gloves;Large sterile sheet  Site Prep: Chlorh...  ?Site Condition No complications  ?Blue Lumen Status Flushed;Heparin locked;Dead end cap in place  ?Red Lumen Status Flushed;Dead end cap in place;Heparin locked  ?Purple Lumen Status N/A  ?Catheter fill solution Heparin 1000 units/ml  ?Catheter fill volume (Arterial) 1.6 cc  ?Catheter fill volume (Venous) 1.6  ?Dressing Type Transparent  ?Dressing Status Antimicrobial disc in place;Clean, Dry, Intact  ?Interventions New dressing;Dressing changed;Antimicrobial disc changed  ?Drainage Description None  ?Dressing Change Due 01/08/22  ?Post treatment catheter status Capped and Clamped  ? ?HD tx complete, no adverse effects. Uf goal met. ?

## 2022-01-01 NOTE — Progress Notes (Signed)
Per Dr. Elliot Gurney, pt may have ice chips at this time. ?

## 2022-01-01 NOTE — Progress Notes (Signed)
Nutrition Follow-up ? ?DOCUMENTATION CODES:  ?Not applicable ? ?INTERVENTION:  ?-Continue Nepro Shake po BID, each supplement provides 425 kcal and 19 grams protein ?-Continue double protein portions ?-Continue renavite daily ? ?NUTRITION DIAGNOSIS:  ?Increased nutrient needs related to chronic illness (CKD stg IV progressed to ESRD requiring HD) as evidenced by estimated needs. -- ongoing ? ?GOAL:  ?Patient will meet greater than or equal to 90% of their needs -- progressing  ? ?MONITOR:  ?PO intake, Weight trends, Labs, Supplement acceptance ? ?REASON FOR ASSESSMENT:  ?Malnutrition Screening Tool ?  ? ?ASSESSMENT:  ?Pt with PMH significant for anxiety with panic attacks, HTN, and h/o of pneumothorax admitted with CKD stg IV now progressed to ESRD requiring HD and symptomatic anemia. ? ?3/27 - 1st HD ?3/30 - s/p temp to tunneled HD cath ? ?Pt out of room at time of RD visit. Pt in HD. Per RN, pt doing well with meals and ONS. Recommend continue current nutrition plan of care.  ? ?PO intake: 50-75% x last 6 recorded meals (~67% avg meal intake) ? ?2L UF goal per Nephrology ?Pre-HD weight 75.7 kg ?Admit weight: 73.3 kg ? ?No UOP documented x24 hours ?I/O: -3623ml since admit ? ?Medications: ? [MAR Hold] calcium acetate  667 mg Oral TID WC  ? [MAR Hold] darbepoetin (ARANESP) injection - DIALYSIS  300 mcg Intravenous Q Wed-HD  ? [MAR Hold] feeding supplement (NEPRO CARB STEADY)  237 mL Oral BID BM  ? [MAR Hold] multivitamin  1 tablet Oral QHS  ? [MAR Hold] pantoprazole  40 mg Oral Daily  ? ?Labs: ?Recent Labs  ?Lab 12/25/21 ?1939 12/27/21 ?6767 12/30/21 ?0400 01/01/22 ?0701  ?NA 131* 135 134* 131*  ?K 4.0 4.1 3.8 4.0  ?CL 99 100 95* 95*  ?CO2 15* 25 23 24   ?BUN 104* 69* 19 38*  ?CREATININE 16.29* 13.22* 6.61* 12.40*  ?CALCIUM 8.4* 8.6* 8.9 8.4*  ?PHOS 6.5* 6.2*  --  5.5*  ?GLUCOSE 102* 117* 92 102*  ?Hgb 7.6(L) ? ?Diet Order:   ?Diet Order   ? ?       ?  Diet NPO time specified  Diet effective midnight       ?  ? ?  ?   ? ?  ? ?EDUCATION NEEDS:  ?No education needs have been identified at this time ? ?Skin:  Skin Assessment: Reviewed RN Assessment ? ?Last BM:  4/3 ? ?Height:  ?Ht Readings from Last 1 Encounters:  ?01/01/22 5\' 9"  (1.753 m)  ? ?Weight:  ?Wt Readings from Last 5 Encounters:  ?01/01/22 75.7 kg  ?12/20/21 73 kg  ?12/14/21 73.3 kg  ?11/30/21 69.9 kg  ?11/12/21 76.7 kg  ? ?BMI:  Body mass index is 24.65 kg/m?. ? ?Estimated Nutritional Needs:  ?Kcal:  2100-2300 ?Protein:  105-115 grams ?Fluid:  1L+UOP ? ? ?Theone Stanley., MS, RD, LDN (she/her/hers) ?RD pager number and weekend/on-call pager number located in [place]. ? ?

## 2022-01-01 NOTE — Progress Notes (Signed)
Subjective:  Seen and examined on HD.  BFR 400 mL/ min via TDC, UF goal 2L.  Tolerating rx well.  Sleeping with headphones on, awakens easily ? ?Objective ?Vital signs in last 24 hours: ?Vitals:  ? 01/01/22 0930 01/01/22 1000 01/01/22 1022 01/01/22 1028  ?BP: (!) 177/99 (!) 172/100 (!) 177/117 (!) 180/102  ?Pulse:  71 77 72  ?Resp: 18 (!) 25 (!) 23 (!) 21  ?Temp:    98.3 ?F (36.8 ?C)  ?TempSrc:    Oral  ?SpO2:    94%  ?Weight:      ?Height:      ? ?Weight change: -3.3 kg ? ?Intake/Output Summary (Last 24 hours) at 01/01/2022 1040 ?Last data filed at 01/01/2022 1028 ?Gross per 24 hour  ?Intake 840 ml  ?Output 2000 ml  ?Net -1160 ml  ? ? ?Assessment/Plan: 46 year old M with progressive CKD over the last 2 mos-  now will need dialysis  ?1.Renal- progressive CKD that has been rapid over the last 2 mos-  thought to be due to HTN but has proteinuria and hematuria so very well could have a GN. However, nothing salvageable, he is ESRD.  He agreed to dialysis because he felt terrible-  s/p first HD in the middle of the night 3/27.    second treatment on 3/29 then third treatment 3/30. ?- VS consulted, appreciate assistance, AVF today 4/2 ?- on TTS schedule for HD, rx today 01/01/22.  Outpt appt [facility] ? ?2. Hypertension/volume  - clearly overloaded although interesting not really peripheral edema-  challenge with HD and also gave  lasix to assist-  UOP Not well recorded.  CXR has the appearance of PNA and hemoptysis so started on abx as well- now stopped    BP Is high but better on his multidrug regimen ( norvasc 10/clonidine 0.2 BID and labetalol 400 BID) - no changes in BP reg today-  once volume status is optimized will hopefully be able to wean his O2 ?3. Anemia  - severe- anemia of CKD-   refused transfusion  -  giving high dose ESA and iron.  S/p transfusion 4/1 ?4. Bones-  phos  6.5 -  started phoslo-   PTH 236-  no meds yet  ?5. Dispo-  pending AVF, ok to d/c from renal perspective once he has ?  ? ? ? ?Roy Randolph,  Roy Randolph  ? ? ?Labs: ?Basic Metabolic Panel: ?Recent Labs  ?Lab 12/25/21 ?1939 12/27/21 ?7673 12/30/21 ?0400 01/01/22 ?0701  ?NA 131* 135 134* 131*  ?K 4.0 4.1 3.8 4.0  ?CL 99 100 95* 95*  ?CO2 15* 25 23 24   ?GLUCOSE 102* 117* 92 102*  ?BUN 104* 69* 19 38*  ?CREATININE 16.29* 13.22* 6.61* 12.40*  ?CALCIUM 8.4* 8.6* 8.9 8.4*  ?PHOS 6.5* 6.2*  --  5.5*  ? ?Liver Function Tests: ?Recent Labs  ?Lab 12/25/21 ?1939 12/27/21 ?4193 01/01/22 ?0701  ?ALBUMIN 2.6* 2.4* 2.3*  ? ?No results for input(s): LIPASE, AMYLASE in the last 168 hours. ?No results for input(s): AMMONIA in the last 168 hours. ?CBC: ?Recent Labs  ?Lab 12/27/21 ?0600 12/30/21 ?0400 12/31/21 ?7902 01/01/22 ?0246  ?WBC 9.3 7.0 7.3  --   ?HGB 5.5* 8.0* 7.7* 7.6*  ?HCT 16.5* 25.1* 23.9* 23.0*  ?MCV 80.9 83.4 83.0  --   ?PLT 123* 133* 132*  --   ? ?Cardiac Enzymes: ?No results for input(s): CKTOTAL, CKMB, CKMBINDEX, TROPONINI in the last 168 hours. ?CBG: ?No results for input(s): GLUCAP in the  last 168 hours. ? ?Iron Studies: No results for input(s): IRON, TIBC, TRANSFERRIN, FERRITIN in the last 72 hours. ?Studies/Results: ?VAS [place] UPPER EXT VEIN MAPPING (PRE-OP AVF) ? ?Result Date: 01/01/2022 ?[place] Patient Name:  Roy Randolph  Date of Exam:   12/31/2021 Medical Rec #: [id number]           Accession #:    [id number] Date of Birth: 26-Feb-1976           Patient Gender: M Patient Age:   74 years Exam Location:  [facility] Procedure:      VAS [place] UPPER EXT VEIN MAPPING (PRE-OP AVF) Referring Phys: Corliss Parish --------------------------------------------------------------------------------  Indications: Pre-access. Comparison Study: no prior Performing Technologist: Archie Patten RVS  Examination Guidelines: A complete evaluation includes B-mode imaging, spectral Doppler, color Doppler, and power Doppler as needed of all accessible portions of each vessel. Bilateral testing is considered an integral part of a complete  examination. Limited examinations for reoccurring indications may be performed as noted. +-----------------+-------------+----------+--------+ Right Cephalic   Diameter (cm)Depth (cm)Findings +-----------------+-------------+----------+--------+ Shoulder             0.27        0.35            +-----------------+-------------+----------+--------+ Prox upper arm       0.22        0.26            +-----------------+-------------+----------+--------+ Mid upper arm        0.24        0.25            +-----------------+-------------+----------+--------+ Dist upper arm       0.26        0.35            +-----------------+-------------+----------+--------+ Antecubital fossa    0.43        0.29            +-----------------+-------------+----------+--------+ Prox forearm                            bandges  +-----------------+-------------+----------+--------+ Mid forearm          0.26        0.24            +-----------------+-------------+----------+--------+ Dist forearm         0.22        0.26            +-----------------+-------------+----------+--------+ Wrist                0.14        0.22            +-----------------+-------------+----------+--------+ +-----------------+-------------+----------+--------+ Right Basilic    Diameter (cm)Depth (cm)Findings +-----------------+-------------+----------+--------+ Prox upper arm       0.32        0.57            +-----------------+-------------+----------+--------+ Mid upper arm        0.39        0.50            +-----------------+-------------+----------+--------+ Dist upper arm       0.37        0.57            +-----------------+-------------+----------+--------+ Antecubital fossa    0.43        0.52            +-----------------+-------------+----------+--------+ Prox forearm  0.23        0.23            +-----------------+-------------+----------+--------+ Mid forearm           0.20        0.22            +-----------------+-------------+----------+--------+ Distal forearm       0.16        0.19            +-----------------+-------------+----------+--------+ Wrist                0.15        0.18            +-----------------+-------------+----------+--------+ +-----------------+-------------+----------+---------+ Left Cephalic    Diameter (cm)Depth (cm)Findings  +-----------------+-------------+----------+---------+ Shoulder             0.41        0.32             +-----------------+-------------+----------+---------+ Prox upper arm       0.38        0.31             +-----------------+-------------+----------+---------+ Mid upper arm        0.42        0.23             +-----------------+-------------+----------+---------+ Dist upper arm       0.40        0.29             +-----------------+-------------+----------+---------+ Antecubital fossa    0.44        0.27             +-----------------+-------------+----------+---------+ Prox forearm         0.43        0.34             +-----------------+-------------+----------+---------+ Mid forearm          0.27        0.20   branching +-----------------+-------------+----------+---------+ Dist forearm         0.25        0.30             +-----------------+-------------+----------+---------+ Wrist                0.17        0.20             +-----------------+-------------+----------+---------+ +-----------------+-------------+----------+---------+ Left Basilic     Diameter (cm)Depth (cm)Findings  +-----------------+-------------+----------+---------+ Prox upper arm       0.41        0.68             +-----------------+-------------+----------+---------+ Mid upper arm        0.42        0.57             +-----------------+-------------+----------+---------+ Dist upper arm       0.41        0.43              +-----------------+-------------+----------+---------+ Antecubital fossa    0.25        0.22   branching +-----------------+-------------+----------+---------+ Prox forearm         0.22        0.21             +-----------------+-------------+----------+---------+ Mid forearm          0.19        0.22             +-----------------+-------------+----------+---------+  Distal forearm       0.19        0.20             +---------------

## 2022-01-01 NOTE — Anesthesia Preprocedure Evaluation (Addendum)
Anesthesia Evaluation  ?Patient identified by MRN, date of birth, ID band ?Patient awake ? ? ? ?Reviewed: ?Allergy & Precautions, NPO status , Patient's Chart, lab work & pertinent test results, reviewed documented beta blocker date and time  ? ?History of Anesthesia Complications ?Negative for: history of anesthetic complications ? ?Airway ?Mallampati: II ? ?TM Distance: >3 FB ?Neck ROM: Full ? ? ? Dental ? ?(+) Poor Dentition, Dental Advisory Given ?  ?Pulmonary ?Current Smoker and Patient abstained from smoking.,  ?  ?Pulmonary exam normal ? ? ? ? ? ? ? Cardiovascular ?hypertension, Pt. on medications and Pt. on home beta blockers ?Normal cardiovascular exam ? ? ?  ?Neuro/Psych ? Headaches, PSYCHIATRIC DISORDERS Anxiety   ? GI/Hepatic ?GERD  Medicated and Controlled,(+)  ?  ? substance abuse (hx etoh abuse in past) ? alcohol use and marijuana use,   ?Endo/Other  ?negative endocrine ROS ? Renal/GU ?ESRFRenal diseaseK 4 this AM ?progressive CKD that has been rapid over the last 2 mos-  thought to be due to HTN but has proteinuria and hematuria so very well could have a GN.  ?negative genitourinary ?  ?Musculoskeletal ?negative musculoskeletal ROS ?(+)  ? Abdominal ?  ?Peds ? Hematology ? ?(+) Blood dyscrasia, anemia , REFUSES BLOOD PRODUCTS, Hb 7.6, plt 132- refusing blood products   ?Anesthesia Other Findings ? ? Reproductive/Obstetrics ?negative OB ROS ? ?  ? ? ? ? ? ? ? ? ? ? ? ? ? ?  ?  ? ? ? ? ? ? ? ?Anesthesia Physical ?Anesthesia Plan ? ?ASA: 4 ? ?Anesthesia Plan: General  ? ?Post-op Pain Management: Tylenol PO (pre-op)*  ? ?Induction:  ? ?PONV Risk Score and Plan: 3 and Propofol infusion, TIVA, Midazolam and Ondansetron ? ?Airway Management Planned: LMA ? ?Additional Equipment: None ? ?Intra-op Plan:  ? ?Post-operative Plan: Extubation in OR ? ?Informed Consent: I have reviewed the patients History and Physical, chart, labs and discussed the procedure including the risks,  benefits and alternatives for the proposed anesthesia with the patient or authorized representative who has indicated his/her understanding and acceptance.  ? ? ? ?Dental advisory given ? ?Plan Discussed with: CRNA and Anesthesiologist ? ?Anesthesia Plan Comments:   ? ? ? ? ? ?Anesthesia Quick Evaluation ? ?

## 2022-01-01 NOTE — Plan of Care (Signed)
?  Problem: Clinical Measurements: Goal: Cardiovascular complication will be avoided Outcome: Completed/Met   

## 2022-01-01 NOTE — Anesthesia Postprocedure Evaluation (Signed)
Anesthesia Post Note ? ?Patient: Roy Randolph ? ?Procedure(s) Performed: LEFT ARM BRACHIOCEPHALIC ARTERIOVENOUS  FISTULA CREATION (Left: Arm Upper) ? ?  ? ?Patient location during evaluation: PACU ?Anesthesia Type: General ?Level of consciousness: sedated ?Pain management: pain level controlled ?Vital Signs Assessment: post-procedure vital signs reviewed and stable ?Respiratory status: spontaneous breathing and respiratory function stable ?Cardiovascular status: stable ?Postop Assessment: no apparent nausea or vomiting ?Anesthetic complications: no ? ? ?No notable events documented. ? ?Last Vitals:  ?Vitals:  ? 01/01/22 1425 01/01/22 1500  ?BP: (!) 199/102 (!) 165/107  ?Pulse: 73 75  ?Resp: 15 19  ?Temp: 37.1 ?C 36.8 ?C  ?SpO2: 100% 99%  ?  ?Last Pain:  ?Vitals:  ? 01/01/22 1520  ?TempSrc:   ?PainSc: 9   ? ? ?  ?  ?  ?  ?  ?  ? ?[name] ? ? ? ? ?

## 2022-01-01 NOTE — Telephone Encounter (Signed)
Please refer the pt to one of our sleep docs so we can get this taken care of.  Unfortunately, this was not satisfactory to pt's insurance.  ?

## 2022-01-01 NOTE — Transfer of Care (Signed)
Immediate Anesthesia Transfer of Care Note ? ?Patient: Roy Randolph ? ?Procedure(s) Performed: LEFT ARM BRACHIOCEPHALIC ARTERIOVENOUS  FISTULA CREATION (Left: Arm Upper) ? ?Patient Location: PACU ? ?Anesthesia Type:General ? ?Level of Consciousness: awake and alert  ? ?Airway & Oxygen Therapy: Patient Spontanous Breathing ? ?Post-op Assessment: Report given to RN and Post -op Vital signs reviewed and stable ? ?Post vital signs: Reviewed and stable ? ?Last Vitals:  ?Vitals Value Taken Time  ?BP 174/95 01/01/22 1255  ?Temp    ?Pulse 84 01/01/22 1258  ?Resp 21 01/01/22 1258  ?SpO2 92 % 01/01/22 1258  ?Vitals shown include unvalidated device data. ? ?Last Pain:  ?Vitals:  ? 01/01/22 1028  ?TempSrc: Oral  ?PainSc:   ?   ? ?Patients Stated Pain Goal: 0 (12/28/21 1703) ? ?Complications: No notable events documented. ?

## 2022-01-01 NOTE — Anesthesia Procedure Notes (Signed)
Procedure Name: LMA Insertion ?Date/Time: 01/01/2022 11:53 AM ?Performed by: Lorie Phenix, CRNA ?Pre-anesthesia Checklist: Patient identified, Emergency Drugs available, Suction available and Patient being monitored ?Patient Re-evaluated:Patient Re-evaluated prior to induction ?Oxygen Delivery Method: Circle System Utilized ?Preoxygenation: Pre-oxygenation with 100% oxygen ?Induction Type: IV induction ?Ventilation: Mask ventilation without difficulty ?LMA: LMA inserted ?LMA Size: 4.0 ?Number of attempts: 1 ?Placement Confirmation: positive ETCO2 ?Tube secured with: Tape ?Dental Injury: Teeth and Oropharynx as per pre-operative assessment  ? ? ? ? ?

## 2022-01-01 NOTE — Op Note (Signed)
? ? ?  Patient name: Roy Randolph MRN: [id number] DOB: 1976/08/10 Sex: male ? ?01/01/2022 ?Pre-operative Diagnosis: End-stage renal disease ?Post-operative diagnosis:  Same ?Surgeon:  Eda Paschal. Donzetta Matters, MD ?Assistant: Clydell Hakim, MS3 ?Procedure Performed:  Left brachial artery to cephalic vein AV fistula creation ? ?Indications: 46 year old male now with end-stage renal disease dialyzing via a tunneled catheter.  He is indicated for permanent dialysis access and has suitable basilic and cephalic vein on his nondominant left upper extremity. ? ?Findings: Brachial artery measure approximately 4 mm in diameter was free of disease.  The cephalic vein was very large measuring approximately 5 mm was somewhat thickened from previous IVs but at completion there was a very strong thrill could be traced up the arm with both Doppler and palpation and a palpable radial artery pulse at the wrist also confirmed with Doppler. ?  ?Procedure:  The patient was identified in the holding area and taken to the operating room where is placed supine operative table and general anesthesia was induced.  He was gently prepped and draped in the left upper extremity, antibiotics were minister timeout was called.  Ultrasound was used to identify a very large cephalic vein and brachial artery overriding 1 another just below the antecubital space.  A transverse incision was created.  The vein was dissected out and marked for orientation.  We dissected through the deep fascia to the brachial artery and placed a vessel loop around this.  The vein was clamped distally and transected and flushed with heparinized saline and reclamped.  The distal end of the vein was tied off with 2-0 silk tie.  The artery was then clamped distally proximally opened longitudinally flushed distally with heparinized saline.  The vein was then spatulated and sewn end to side with 6-0 Prolene suture.  Prior completion of flushing all directions.  Upon completion we did at  the place 1 repair stitch.  There was a very strong thrill in the fistula.  We freed up some of the soft tissue around the fistula.  We could trace the thrill as well as Doppler of the thrill up the arm and there was a palpable radial artery pulse the wrist also confirmed with Doppler.  The wound was then irrigated, hemostasis was obtained we closed in layers with Vicryl and Monocryl.  Dermabond was placed at the skin level.  He was then awakened from anesthesia having tolerated procedure well without immediate complication.  All counts were correct at completion. ? ?EBL: 20 cc ? ? ? ? ?Jobina Maita C. Donzetta Matters, MD ?[facility] ?Office: [phone] ?Pager: [phone] ? ? ?

## 2022-01-02 ENCOUNTER — Encounter (HOSPITAL_COMMUNITY): Payer: Self-pay | Admitting: Vascular Surgery

## 2022-01-02 LAB — RENAL FUNCTION PANEL
Albumin: 2.4 g/dL — ABNORMAL LOW (ref 3.5–5.0)
Anion gap: 9 (ref 5–15)
BUN: 29 mg/dL — ABNORMAL HIGH (ref 6–20)
CO2: 26 mmol/L (ref 22–32)
Calcium: 8.7 mg/dL — ABNORMAL LOW (ref 8.9–10.3)
Chloride: 96 mmol/L — ABNORMAL LOW (ref 98–111)
Creatinine, Ser: 8.69 mg/dL — ABNORMAL HIGH (ref 0.61–1.24)
GFR, Estimated: 7 mL/min — ABNORMAL LOW (ref >60)
Glucose, Bld: 106 mg/dL — ABNORMAL HIGH (ref 70–99)
Phosphorus: 5.8 mg/dL — ABNORMAL HIGH (ref 2.5–4.6)
Potassium: 4.5 mmol/L (ref 3.5–5.1)
Sodium: 131 mmol/L — ABNORMAL LOW (ref 135–145)

## 2022-01-02 LAB — HEMOGLOBIN AND HEMATOCRIT, BLOOD
HCT: 22.1 % — ABNORMAL LOW (ref 39.0–52.0)
Hemoglobin: 7 g/dL — ABNORMAL LOW (ref 13.0–17.0)

## 2022-01-02 LAB — PREPARE RBC (CROSSMATCH)

## 2022-01-02 MED ORDER — SODIUM CHLORIDE 0.9% IV SOLUTION: Freq: Once | INTRAVENOUS | Status: DC

## 2022-01-02 MED ORDER — DARBEPOETIN ALFA 300 MCG/0.6ML IJ SOSY
300.0000 ug | PREFILLED_SYRINGE | INTRAMUSCULAR | 0 refills | Status: AC | PRN
Start: 2022-01-02 — End: ?

## 2022-01-02 MED ORDER — ACETAMINOPHEN 325 MG PO TABS
650.0000 mg | ORAL_TABLET | Freq: Four times a day (QID) | ORAL | 0 refills | Status: DC | PRN
  Filled 2022-01-02: qty 30, 4d supply, fill #0

## 2022-01-02 MED ORDER — HYDROMORPHONE HCL 2 MG PO TABS
1.0000 mg | ORAL_TABLET | Freq: Two times a day (BID) | ORAL | 0 refills | Status: AC | PRN
Start: 2022-01-02 — End: 2022-01-07
  Filled 2022-01-02: qty 5, 5d supply, fill #0

## 2022-01-02 MED ORDER — HYDROMORPHONE HCL 1 MG/ML IJ SOLN
0.5000 mg | Freq: Four times a day (QID) | INTRAMUSCULAR | Status: DC | PRN
  Administered 2022-01-02: 0.5 mg via INTRAVENOUS
  Filled 2022-01-02: qty 1

## 2022-01-02 MED ORDER — CHLORHEXIDINE GLUCONATE CLOTH 2 % EX PADS: 6.0000 | MEDICATED_PAD | Freq: Every day | CUTANEOUS | Status: DC

## 2022-01-02 MED ORDER — RENA-VITE PO TABS
1.0000 | ORAL_TABLET | Freq: Every day | ORAL | 0 refills | Status: DC
Start: 2022-01-02 — End: 2022-02-08
  Filled 2022-01-02 – 2022-02-01 (×2): qty 30, 30d supply, fill #0

## 2022-01-02 MED ORDER — KIDNEY FAILURE BOOK: Freq: Once | Status: AC

## 2022-01-02 MED ORDER — CLONIDINE HCL 0.3 MG PO TABS
0.3000 mg | ORAL_TABLET | Freq: Three times a day (TID) | ORAL | 0 refills | Status: DC
  Filled 2022-01-02 – 2022-01-03 (×2): qty 90, 30d supply, fill #0

## 2022-01-02 MED ORDER — CALCIUM ACETATE (PHOS BINDER) 667 MG PO CAPS
667.0000 mg | ORAL_CAPSULE | Freq: Three times a day (TID) | ORAL | 0 refills | Status: DC
  Filled 2022-01-02 – 2022-01-03 (×2): qty 90, 30d supply, fill #0

## 2022-01-02 MED ORDER — LABETALOL HCL 200 MG PO TABS
400.0000 mg | ORAL_TABLET | Freq: Two times a day (BID) | ORAL | 0 refills | Status: DC
  Filled 2022-01-02 – 2022-01-03 (×2): qty 60, 15d supply, fill #0

## 2022-01-02 MED ORDER — DARBEPOETIN ALFA 300 MCG/0.6ML IJ SOSY: 300.0000 ug | PREFILLED_SYRINGE | INTRAMUSCULAR | 0 refills | Status: DC

## 2022-01-02 NOTE — Progress Notes (Signed)
Subjective:  Seen and examined on HD.  BFR 400 mL/ min via TDC, UF goal 2L.  Tolerating rx well.  Sleeping with headphones on, awakens easily ? ?Objective ?Vital signs in last 24 hours: ?Vitals:  ? 01/02/22 0454 01/02/22 0900 01/02/22 1244 01/02/22 1313  ?BP: (!) 137/99 (!) 144/94 133/82 (!) 132/93  ?Pulse: 72 76 66 68  ?Resp: 18 18 18 18   ?Temp: 97.9 ?F (36.6 ?C) (!) 97.5 ?F (36.4 ?C) 97.9 ?F (36.6 ?C) 97.7 ?F (36.5 ?C)  ?TempSrc: Oral Oral Oral Oral  ?SpO2: 96% 98% 100% 98%  ?Weight: 81.2 kg     ?Height:      ? ?Weight change: 0 kg ? ?Intake/Output Summary (Last 24 hours) at 01/02/2022 1505 ?Last data filed at 01/02/2022 1037 ?Gross per 24 hour  ?Intake 360 ml  ?Output 0 ml  ?Net 360 ml  ? ? ?Assessment/Plan: 46 year old M with progressive CKD over the last 2 mos-  now will need dialysis  ?1.Renal- progressive CKD that has been rapid over the last 2 mos-  thought to be due to HTN but has proteinuria and hematuria so very well could have a GN. However, nothing salvageable, he is ESRD.  He agreed to dialysis because he felt terrible-  s/p first HD in the middle of the night 3/27.    second treatment on 3/29 then third treatment 3/30. ?- VS consulted, appreciate assistance, AVF today 4/4 ?- on TTS schedule for HD, rx 01/01/22.  Outpt appt [facility] ? ?2. Hypertension/volume   ?- Bps are better ?- norvasc 10 mg daily, clonidine 0.3 TID, labetalol 400 BID ?3. Anemia  - severe- anemia of CKD. giving high dose ESA and iron.  S/p transfusion 4/1 and today 01/01/22 ?4. Bones-  phos  6.5 -  started phoslo-   PTH 236-  no meds yet  ?5. Dispo-  has AVF and OP spot.  Getting transfused for anemia of CKD.  OK to d/c from renal perspective ?  ? ? ? ?Bernardette Waldron  ? ? ?Labs: ?Basic Metabolic Panel: ?Recent Labs  ?Lab 12/27/21 ?0655 12/30/21 ?0400 01/01/22 ?0701 01/01/22 ?1108 01/02/22 ?0913  ?NA 135 134* 131* 134* 131*  ?K 4.1 3.8 4.0 4.0 4.5  ?CL 100 95* 95* 97* 96*  ?CO2 25 23 24   --  26  ?GLUCOSE 117* 92 102* 94 106*  ?BUN 69* 19  38* 14 29*  ?CREATININE 13.22* 6.61* 12.40* 5.70* 8.69*  ?CALCIUM 8.6* 8.9 8.4*  --  8.7*  ?PHOS 6.2*  --  5.5*  --  5.8*  ? ?Liver Function Tests: ?Recent Labs  ?Lab 12/27/21 ?5427 01/01/22 ?0701 01/02/22 ?0913  ?ALBUMIN 2.4* 2.3* 2.4*  ? ?No results for input(s): LIPASE, AMYLASE in the last 168 hours. ?No results for input(s): AMMONIA in the last 168 hours. ?CBC: ?Recent Labs  ?Lab 12/27/21 ?0600 12/30/21 ?0400 12/31/21 ?0623 01/01/22 ?0246 01/01/22 ?1108 01/02/22 ?0543  ?WBC 9.3 7.0 7.3  --   --   --   ?HGB 5.5* 8.0* 7.7* 7.6* 9.5* 7.0*  ?HCT 16.5* 25.1* 23.9* 23.0* 28.0* 22.1*  ?MCV 80.9 83.4 83.0  --   --   --   ?PLT 123* 133* 132*  --   --   --   ? ?Cardiac Enzymes: ?No results for input(s): CKTOTAL, CKMB, CKMBINDEX, TROPONINI in the last 168 hours. ?CBG: ?No results for input(s): GLUCAP in the last 168 hours. ? ?Iron Studies: No results for input(s): IRON, TIBC, TRANSFERRIN, FERRITIN in  the last 72 hours. ?Studies/Results: ?No results found. ?Medications: ?Infusions: ? ? ? ?Scheduled Medications: ? sodium chloride   Intravenous Once  ? sodium chloride   Intravenous Once  ? amLODipine  10 mg Oral Daily  ? calcium acetate  667 mg Oral TID WC  ? Chlorhexidine Gluconate Cloth  6 each Topical Q0600  ? cloNIDine  0.3 mg Oral TID  ? darbepoetin (ARANESP) injection - DIALYSIS  300 mcg Intravenous Q Wed-HD  ? feeding supplement (NEPRO CARB STEADY)  237 mL Oral BID BM  ? labetalol  400 mg Oral BID  ? multivitamin  1 tablet Oral QHS  ? pantoprazole  40 mg Oral Daily  ? sertraline  25 mg Oral Daily  ? ? have reviewed scheduled and prn medications. ? ?Physical Exam: ?General:  lying in bed, headphones on and listening to music ?Heart: RRR ?Lungs:  clear- on RA ?Abdomen: soft, non tender ?Extremities: min pitting edema ?Dialysis Access: R TDC- dressing looks good, L AVF arm in sling with great T/B and good hand perfusion ? ? ?01/02/2022,3:05 PM ? LOS: 9 days  ? ?  ? ? ? ? ?

## 2022-01-02 NOTE — Progress Notes (Signed)
Rounded on patient today in correlation to transition to outpatient HD. Ordered Kidney Failure given. Patient educated at the bedside regarding care of tunneled dialysis catheter, AV fistula care, assessment of thrill daily and proper medication administration on HD days.  Patient also educated on the importance of adhering to scheduled dialysis treatments, the effects of fluid overload, hyperkalemia and hyperphosphatemia. Patient capable of re-verbalizing via teach back method. Also educated patient on services available through the interdisciplinary team in the clinic setting. Patient is adamant about ensuring that he follows his diet restrictions and scheduled dialysis treatment. Patient with no further questions at this time. Handouts and contact information provided to patient for any further assistance. Will follow as appropriate. ?

## 2022-01-02 NOTE — TOC Transition Note (Signed)
Transition of Care (TOC) - CM/SW Discharge Note ? ? ?Patient Details  ?Name: Maylon L Alsobrook ?MRN: [id number] ?Date of Birth: 1976-01-09 ? ?Transition of Care (TOC) CM/SW Contact:  ?Carles Collet, RN ?Phone Number: ?01/02/2022, 1:23 PM ? ? ?Clinical Narrative:    ?Kasandra Knudsen ordered to room through [employer].  ? ? ?Final next level of care: Home/Self Care ?Barriers to Discharge: Continued Medical Work up ? ? ?Patient Goals and CMS Choice ?Patient states their goals for this hospitalization and ongoing recovery are:: To return home ?CMS Medicare.gov Compare Post Acute Care list provided to:: Patient ?Choice offered to / list presented to : NA ? ?Discharge Placement ?  ?           ?  ?  ?  ?  ? ?Discharge Plan and Services ?  ?Discharge Planning Services: CM Consult ?Post Acute Care Choice: Durable Medical Equipment          ?DME Arranged: Kasandra Knudsen ?DME Agency: [employer] ?Date DME Agency Contacted: 01/02/22 ?Time DME Agency Contacted: 1275 ?Representative spoke with at DME Agency: Sue Lush ?HH Arranged: NA ?[place] Agency: NA ?  ?  ?  ? ?Social Determinants of Health (SDOH) Interventions ?  ? ? ?Readmission Risk Interventions ? ?  12/28/2021  ?  1:23 PM  ?Readmission Risk Prevention Plan  ?Transportation Screening Complete  ?PCP or Specialist Appt within 5-7 Days Complete  ?Home Care Screening Complete  ?Medication Review (RN CM) Complete  ? ? ? ? ? ?

## 2022-01-02 NOTE — Progress Notes (Signed)
Met with pt at bedside to discuss/review pt's out-pt HD schedule. Advised pt that he needs to arrive at clinic tomorrow morning at 9:30 to complete paperwork prior to 10:30 chair time at [facility] (TTS schedule). Added pt's out-pt HD appts to AVS as well. Clinic has been contacted and Shawn, [employer] manager, aware that pt should d/c today and start tomorrow morning. Contacted renal NP regarding clinic's need for orders. Pt inquired about a later appt. Explained that the time he received is one of the later appts and that his appt time is considered 2nd shift. Pt voices understanding and agreeable to plan.  ? ?Melven Sartorius ?Renal Navigator ?[phone] ?

## 2022-01-02 NOTE — Discharge Instructions (Signed)
Roy Randolph,  ? ?Thank you for allowing [place] to care for you. You were started on dialysis and had a fistula placed in your left arm. We will prescribe you 5 days of opioid pain medicine for this. Please take only every 12 hours for severe pain. Take tylenol (one extra strength every 6 hours) for mild to moderate pain. Please follow up with nephrology tomorrow. I will reach out to the clinic about scheduling you an appointment with [facility].  ? ?Any new symptoms or big changes, please call the clinic.  ? ?Thank You  ?

## 2022-01-02 NOTE — Progress Notes (Signed)
Roy Randolph to be discharged Home per MD order. Discussed prescriptions and follow up appointments with the patient. Prescriptions and medication list explained in detail. Patient verbalized understanding. ? ?Skin clean, dry and intact without evidence of skin break down, no evidence of skin tears noted. IV catheter discontinued intact. Site without signs and symptoms of complications. Dressing and pressure applied. Pt denies pain at the site currently. No complaints noted. ? ?Patient free of lines, drains, and wounds.  ? ?An After Visit Summary (AVS) was printed and given to the patient. ?Patient escorted via wheelchair, and discharged home via private auto. ? ?Amaryllis Dyke, RN  ?

## 2022-01-02 NOTE — Progress Notes (Signed)
[facility] [place] ? ?Subjective  - Doing well without symptoms of pain, loss of motor or sensation. ? ? ?Objective ?(!) 137/99 ?72 ?97.9 ?F (36.6 ?C) (Oral) ?18 ?96% ? ?Intake/Output Summary (Last 24 hours) at 01/02/2022 0702 ?Last data filed at 01/02/2022 0532 ?Gross per 24 hour  ?Intake 660 ml  ?Output 2010 ml  ?Net -1350 ml  ? ? ?Left AC incision healing well ?Palpable thrill in fistula and radial pulse ?Lungs non labored breathing ? ?Assessment/Planning: ?POD # 1 left BC AV fistula ? ?Patient is on HD via [facility]  ?He will f/u for duplex in 4-6 weeks.  Do not stick fistula for 12 weeks total. ?No symptoms of steal ? ?Roxy Horseman ?01/02/2022 ?7:02 AM ?-- ? ?Laboratory ?Lab Results: ?Recent Labs  ?  12/31/21 ?5784 01/01/22 ?0246 01/01/22 ?1108 01/02/22 ?0543  ?WBC 7.3  --   --   --   ?HGB 7.7*   < > 9.5* 7.0*  ?HCT 23.9*   < > 28.0* 22.1*  ?PLT 132*  --   --   --   ? < > = values in this interval not displayed.  ? ?BMET ?Recent Labs  ?  01/01/22 ?0701 01/01/22 ?1108  ?NA 131* 134*  ?K 4.0 4.0  ?CL 95* 97*  ?CO2 24  --   ?GLUCOSE 102* 94  ?BUN 38* 14  ?CREATININE 12.40* 5.70*  ?CALCIUM 8.4*  --   ? ? ?COAG ?Lab Results  ?Component Value Date  ? INR 1.2 12/27/2021  ? INR 1.2 11/30/2021  ? INR 1.1 10/10/2021  ? ?No results found for: PTT ? ? ? ?

## 2022-01-02 NOTE — Discharge Summary (Addendum)
? ?Name: Mable L Borjon ?MRN: [id number] ?DOB: 1976/09/05 46 y.o. ?PCP: Christiana Fuchs, DO ? ?Date of Admission: 12/24/2021  9:36 AM ?Date of Discharge: 01/02/2022 ?Attending Physician: Dr.  Saverio Danker ? ?Discharge Diagnosis: ?Principal Problem: ?  ESRD needing dialysis ([place]) ?Active Problems: ?  Thrombocytopenia ([place]) ?  Essential hypertension ?  Uremia ?  Anemia of chronic kidney failure, stage 5 (HCC) ?  ? ?Discharge Medications: ?Allergies as of 01/02/2022   ? ?   Reactions  ? Cucumber Extract Anaphylaxis, Itching  ? Other Anaphylaxis, Itching, Swelling, Other (See Comments)  ? The patient CANNOT eat raw/fresh fruit or vegetables. Reaction starts with ears itching, then progresses from there. He can eat canned versions of these foods, however.  ? Tomato (diagnostic) Anaphylaxis, Swelling  ? Wild Lettuce [wild Lettuce Extract (lactuca Virosa)] Anaphylaxis, Swelling  ? Hydralazine Hives, Itching, Other (See Comments)  ? SEVERE ITCHING  ? ?  ? ?  ?Medication List  ?  ? ?STOP taking these medications   ? ?CertaVite/Antioxidants Tabs ?  ?Potassium 95 MG Tabs ?  ?VITAMIN C PO ?  ? ?  ? ?TAKE these medications   ? ?acetaminophen 325 MG tablet ?Commonly known as: TYLENOL ?Take 2 tablets (650 mg total) by mouth every 6 (six) hours as needed for fever or mild pain. ?What changed:  ?how much to take ?reasons to take this ?  ?amLODipine 10 MG tablet ?Commonly known as: NORVASC ?Take 1 tablet (10 mg total) by mouth daily. ?  ?calcium acetate 667 MG capsule ?Commonly known as: PHOSLO ?Take 1 capsule (667 mg total) by mouth 3 (three) times daily with meals. ?  ?cloNIDine 0.3 MG tablet ?Commonly known as: CATAPRES ?Take 1 tablet (0.3 mg total) by mouth 3 (three) times daily. ?What changed:  ?medication strength ?how much to take ?when to take this ?  ?Darbepoetin Alfa 300 MCG/0.6ML Sosy injection ?Commonly known as: ARANESP ?Inject 0.6 mLs (300 mcg total) into the vein every dialysis (Tu,Th,Sat). ?  ?HYDROmorphone 2 MG tablet ?Commonly  known as: Dilaudid ?Take 0.5 tablets (1 mg total) by mouth every 12 (twelve) hours as needed for up to 5 days for severe pain. ?  ?labetalol 200 MG tablet ?Commonly known as: NORMODYNE ?Take 2 tablets (400 mg total) by mouth 2 (two) times daily. ?What changed:  ?medication strength ?how much to take ?  ?multivitamin Tabs tablet ?Take 1 tablet by mouth at bedtime. ?  ?pantoprazole 40 MG tablet ?Commonly known as: PROTONIX ?Take 1 tablet (40 mg total) by mouth daily. ?  ?sertraline 25 MG tablet ?Commonly known as: Zoloft ?Take 1 tablet (25 mg total) by mouth daily. ?  ?sodium chloride 0.65 % Soln nasal spray ?Commonly known as: OCEAN ?Place 1 spray into both nostrils as needed for congestion. ?  ? ?  ? ?  ?  ? ? ?  ?Durable Medical Equipment  ?(From admission, onward)  ?  ? ? ?  ? ?  Start     Ordered  ? 01/02/22 1321  For home use only DME Cane  Once       ? 01/02/22 1320  ? ?  ?  ? ?  ? ? ?  ?Discharge Care Instructions  ?(From admission, onward)  ?  ? ? ?  ? ?  Start     Ordered  ? 01/02/22 0000  Leave dressing on - Keep it clean, dry, and intact until clinic visit       ? 01/02/22 1729  ? ?  ?  ? ?  ? ? ?  Disposition and follow-up:   ?Mr.Adolphus L Franzoni was discharged from [facility] in Stable condition.  At the hospital follow up visit please address: ? ?1.  Follow-up: ? a.  End-stage renal disease, on hemodialysis Tuesday Thursday Saturday.  He does have anemia secondary to chronic renal disease and has required 3 units of blood this hospitalization.  Will need to be monitored for continued anemia.  He underwent AV graft placement yesterday and was reporting pain.  Discharged home on Dilaudid as he is limited in pain control options due to ESRD. ?  ? b.  Microcytic anemia-likely related to chronic renal disease.  He completed ferric gluconate, and was receiving ESA per nephrology.  This will need to be continually monitored at HD sessions as he has required transfusions.  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 Discuss PO iron supplementation. ? ? c.  Hypertension-better controlled on Norvasc 10 clonidine 0.3 3 times daily, labetalol 400 twice daily.  Hypertension is also controlled through dialysis.  Continue to monitor. ? ? d.  Anxiety-concurrent panic attacks.  Initially managed with Xanax but he has been transitioned to Zoloft. ? ?2.  Labs / imaging needed at time of follow-up: CBC, BMP ? ?3.  Pending labs/ test needing follow-up: None ? ?4.  Medication Changes ? Started: Dilaudid for pain control, Zoloft ? Stopped: Xanax ? ?Follow-up Appointments: ? Follow-up Information   ? ? [place], [place] on 01/03/2022.   ?Why: Schedule is Tuesday/Thursday/Saturyday with 10:30 chair time.  ?Patient can start on Thursday, April 6. Patient needs to arrive at 9:30 to complete paperwork prior to treatment. ?Contact information: ?[address] ?[place] [address] ?[phone] ? ? ?  ?  ? ? Waynetta Sandy, MD Follow up in 5 week(s).   ?Specialties: Vascular Surgery, Cardiology ?Why: Office will call you to arrange your appt (sent) ?Contact information: ?[address] ?[place] [address] ?[phone] ? ? ?  ?  ? ? Masters, Scientist, research (physical sciences), DO. Schedule an appointment as soon as possible for a visit.   ?Specialty: Internal Medicine ?Contact information: ?[address] ?[place] [address] ?[phone] ? ? ?  ?  ? ? Pixie Casino, MD .   ?Specialty: Cardiology ?Contact information: ?[address] ?[address] ?[place] [address] ?[phone] ? ? ?  ?  ? ?  ?  ? ?  ? ? ?Hospital Course by problem list:  ?Patient presented with shortness of breath, noted to be in acute renal failure.  He underwent dialysis sessions x4 with improvement in symptoms.  He is also noted to have anemia secondary to chronic renal disease, and this anemia was likely also contributing to his SOB.  He had tunneled HD cath placed initially with subsequent  vein mapping and creation of AV graft on 4/4 he is tolerated this procedure well.  He has been set up with [facility] for HD on a Tuesday Thursday Saturday schedule.  He has been doing much better since undergoing dialysis.  He had previously been managed for CKD stage IV however renal function is not salvageable at this point. ? ?Anemia.  This was thought likely related to slow oozing secondary to uremia, compounded by chronic renal disease.  Initially we were unable to administer blood products due to patient being Jehov[employer]'s Witness, however after he discussed with his sister he decided he did not want to continue living like this and was amenable to  receiving blood.  He initially received 2 units of blood and IV iron with improvement, however his blood counts continued to slowly decline.  Blood hemoglobin noted to be 7.0 today 4/5.  He was given 1 additional unit of blood and deemed stable for discharge.  With blood administration he reported significant improvement in symptoms. ? ?Hypertension likely secondary to volume overload.  On multidrug regimen, however this was not adequately controlling his blood pressure due to his concurrent renal disease.  His clonidine was increased to 0.3 mg 3 times daily.  He did demonstrate some improvement in his blood pressure through medication management as well as initiation of HD. ? ?Acute hypoxic respiratory failure.  Patient was endorsing some chest tightness and symptoms which she said felt similar to prior anxiety.  Although he had previously required oxygen these findings were felt to be related to volume overload and anxiety.  He was given O2 and Xanax as needed for the symptoms and noted to be stable with administration of these treatments. ? ?History of anxiety.  Patient was reporting panic attacks and was initially managed with Xanax but he was transitioned over to Zoloft.  Panic attacks resolved, question whether these were related to anemia. ? ?Discharge  Subjective: ?Patient reporting he feels much better, does endorse some pain in his arm related to recent surgery.  No other complaints at this time and is ready go home. ? ?Discharge Exam:   ?BP (!) 141/99 (BP Location:

## 2022-01-02 NOTE — Progress Notes (Signed)
Physical Therapy Treatment ?Patient Details ?Name: Roy Randolph ?MRN: [id number] ?DOB: 06-Sep-1976 ?Today's Date: 01/02/2022 ? ? ?History of Present Illness 46 y/o male presented to ED on 12/24/21 from [facility] d/t HTN and coughing up blood x 5 days. Admitted for ESRD with need for HD. s/p AV fistula placement 4/4; CXR conered for pneumonia in L and R lobe. PMH: CKD stage IV, HTN ? ?  ?PT Comments  ? ? Continuing work on functional mobility and activity tolerance;  Session focused on more ambulation, and also provided general education on sling use, and gentle ROM L shoulder, hand and elbow; REturn demonstrated well; offered to go up aand down steps in prep for going home, and pt politely declined -- he promises to have someone with him on the steps at home until he is more confident; OK for dc home from PT standpoint; will practice stairs next session if he is still here   ?Recommendations for follow up therapy are one component of a multi-disciplinary discharge planning process, led by the attending physician.  Recommendations may be updated based on patient status, additional functional criteria and insurance authorization. ? ?Follow Up Recommendations ? No PT follow up (would benefit from cardiac/pulmonary rehab) ?  ?  ?Assistance Recommended at Discharge PRN  ?Patient can return home with the following   ?  ?Equipment Recommendations ? None recommended by PT  ?  ?Recommendations for Other Services   ? ? ?  ?Precautions / Restrictions Precautions ?Precaution Comments: New L AV fistula  ?  ? ?Mobility ? Bed Mobility ?Overal bed mobility: Modified Independent ?Bed Mobility: Supine to Sit, Sit to Supine ?  ?  ?Supine to sit: Modified independent (Device/Increase time) ?Sit to supine: Modified independent (Device/Increase time) ?  ?General bed mobility comments: No difficulty ?  ? ?Transfers ?Overall transfer level: Modified independent ?Equipment used: None ?Transfers: Sit to/from Stand ?Sit to Stand:  Modified independent (Device/Increase time) ?  ?  ?  ?  ?  ?General transfer comment: No difficulty; managing carefully with LUE ?  ? ?Ambulation/Gait ?Ambulation/Gait assistance: Modified independent (Device/Increase time) ?Gait Distance (Feet): 300 Feet ?Assistive device:  (Sling LUE) ?Gait Pattern/deviations: Step-through pattern (approaching WNL) ?  ?  ?  ?General Gait Details: Good self-monitor for activity tolerance; able to relax LUE in sling while walking ? ? ?Stairs ?  ?  ?  ?  ?General stair comments: We discussed stairs, and pt politely declined practicing them; He promises he will have assist going up and down teh flight until he feels more confident ? ? ?Wheelchair Mobility ?  ? ?Modified Rankin (Stroke Patients Only) ?  ? ? ?  ?Balance Overall balance assessment: Mild deficits observed, not formally tested ?  ?  ?  ?  ?  ?  ?  ?  ?  ?  ?  ?  ?  ?  ?  ?  ?  ?  ?  ? ?  ?Cognition Arousal/Alertness: Awake/alert ?Behavior During Therapy: [facility] for tasks assessed/performed ?Overall Cognitive Status: Within Functional Limits for tasks assessed ?  ?  ?  ?  ?  ?  ?  ?  ?  ?  ?  ?  ?  ?  ?  ?  ?General Comments: very aware of deficits and good safety awareness. Chose to stop driving due to visual deficits and wanting to keep himself and everyone on the road safe ?  ?  ? ?  ?Exercises   ? ?  ?  General Comments General comments (skin integrity, edema, etc.): demonstrated sling fit; encouraged shoulder, hand, and gentle elbow ROM to tolerance ?  ?  ? ?Pertinent Vitals/Pain Pain Assessment ?Pain Assessment: Faces ?Faces Pain Scale: Hurts little more ?Pain Location: L upper arm ?Pain Descriptors / Indicators: Aching, Grimacing, Guarding ?Pain Intervention(s): Monitored during session, Other (comment) (Assisted pt in fittin gsling)  ? ? ?Home Living   ?  ?  ?  ?  ?  ?  ?  ?  ?  ?   ?  ?Prior Function    ?  ?  ?   ? ?PT Goals (current goals can now be found in the care plan section) Acute Rehab PT Goals ?Patient Stated  Goal: to feel better; home today ?PT Goal Formulation: With patient ?Time For Goal Achievement: 01/10/22 ?Potential to Achieve Goals: Good ?Progress towards PT goals: Progressing toward goals (met all goals except stairs) ? ?  ?Frequency ? ? ? Min 3X/week ? ? ? ?  ?PT Plan Current plan remains appropriate  ? ? ?Co-evaluation   ?  ?  ?  ?  ? ?  ?AM-PAC PT "6 Clicks" Mobility   ?Outcome Measure ? Help needed turning from your back to your side while in a flat bed without using bedrails?: None ?Help needed moving from lying on your back to sitting on the side of a flat bed without using bedrails?: None ?Help needed moving to and from a bed to a chair (including a wheelchair)?: None ?Help needed standing up from a chair using your arms (e.g., wheelchair or bedside chair)?: None ?Help needed to walk in hospital room?: None ?Help needed climbing 3-5 steps with a railing? : A Little ?6 Click Score: 23 ? ?  ?End of Session   ?Activity Tolerance: Patient tolerated treatment well ?Patient left: in bed;with call bell/phone within reach;Other (comment) ?Nurse Communication: Mobility status ?PT Visit Diagnosis: Unsteadiness on feet (R26.81);Muscle weakness (generalized) (M62.81) ?  ? ? ?Time: 3414-4360 ?PT Time Calculation (min) (ACUTE ONLY): 27 min ? ?Charges:  $Gait Training: 8-22 mins ?$Therapeutic Activity: 8-22 mins          ?          ? ?Roney Marion, PT  ?Acute Rehabilitation Services ?Pager [phone] ?Office [phone] ? ? ? ?Colletta Maryland ?01/02/2022, 12:39 PM ? ?

## 2022-01-03 ENCOUNTER — Other Ambulatory Visit (HOSPITAL_COMMUNITY): Payer: Self-pay

## 2022-01-03 LAB — TYPE AND SCREEN
ABO/RH(D): O POS
Antibody Screen: NEGATIVE
Unit division: 0

## 2022-01-03 LAB — BPAM RBC
Blood Product Expiration Date: 202305052359
ISSUE DATE / TIME: 202304051252
Unit Type and Rh: 5100

## 2022-01-14 ENCOUNTER — Telehealth: Payer: Self-pay | Admitting: Student

## 2022-01-14 NOTE — Telephone Encounter (Cosign Needed Addendum)
MS3 Roy Randolph called to follow-up regarding care transition. Roy Randolph was able to start outpatient dialysis and is aware of his upcoming appointment with IM clinic on 4/21. He reports that his graft has been performing well and that he is appreciative of the care that he received in the hospital. He reports that he had some difficulty with picking up multivitamins at the [facility], as the medication was not released. He was able to pick up his calcium, clonidine, and labetalol without issue and has been taking those medications. He reports some difficulty with transportation services to dialysis due to insurance, but has been taking cabs. He reports some shortness of breath with shallow, rapid breathing, but that it is intermittent. He was not short of breath during the phone conversation. I recommended that he bring this up at his upcoming appointment with Dr. Marianna Payment on 4/21. ? ?Attestation for Student Documentation: ?I personally was present for the follow up discussion had between Roy Randolph and student doctor Tobey Bride. I have verified that the service and findings are accurately documented in the student?s note. ? ?Riesa Pope, MD ?01/14/2022, 4:39 PM ? ?

## 2022-01-17 ENCOUNTER — Encounter: Payer: Medicaid Other | Admitting: Internal Medicine

## 2022-01-18 ENCOUNTER — Ambulatory Visit (INDEPENDENT_AMBULATORY_CARE_PROVIDER_SITE_OTHER): Payer: Medicaid Other | Admitting: Internal Medicine

## 2022-01-18 ENCOUNTER — Encounter: Payer: Self-pay | Admitting: Internal Medicine

## 2022-01-18 ENCOUNTER — Other Ambulatory Visit (HOSPITAL_COMMUNITY): Payer: Self-pay

## 2022-01-18 ENCOUNTER — Other Ambulatory Visit: Payer: Self-pay

## 2022-01-18 VITALS — BP 138/82 | HR 67 | Temp 97.5°F | Ht 69.0 in | Wt 162.1 lb

## 2022-01-18 DIAGNOSIS — N185 Chronic kidney disease, stage 5: Secondary | ICD-10-CM

## 2022-01-18 DIAGNOSIS — F4323 Adjustment disorder with mixed anxiety and depressed mood: Secondary | ICD-10-CM

## 2022-01-18 DIAGNOSIS — D631 Anemia in chronic kidney disease: Secondary | ICD-10-CM

## 2022-01-18 DIAGNOSIS — N186 End stage renal disease: Secondary | ICD-10-CM

## 2022-01-18 DIAGNOSIS — F419 Anxiety disorder, unspecified: Secondary | ICD-10-CM | POA: Diagnosis not present

## 2022-01-18 DIAGNOSIS — D649 Anemia, unspecified: Secondary | ICD-10-CM

## 2022-01-18 DIAGNOSIS — G479 Sleep disorder, unspecified: Secondary | ICD-10-CM

## 2022-01-18 DIAGNOSIS — Z992 Dependence on renal dialysis: Secondary | ICD-10-CM | POA: Diagnosis not present

## 2022-01-18 DIAGNOSIS — N281 Cyst of kidney, acquired: Secondary | ICD-10-CM | POA: Diagnosis not present

## 2022-01-18 DIAGNOSIS — I120 Hypertensive chronic kidney disease with stage 5 chronic kidney disease or end stage renal disease: Secondary | ICD-10-CM | POA: Diagnosis not present

## 2022-01-18 DIAGNOSIS — I10 Essential (primary) hypertension: Secondary | ICD-10-CM

## 2022-01-18 MED ORDER — CALCIUM ACETATE (PHOS BINDER) 667 MG PO CAPS
667.0000 mg | ORAL_CAPSULE | Freq: Three times a day (TID) | ORAL | 0 refills | Status: DC
  Filled 2022-01-18: qty 90, 30d supply, fill #0

## 2022-01-18 MED ORDER — AMLODIPINE BESYLATE 10 MG PO TABS
10.0000 mg | ORAL_TABLET | Freq: Every day | ORAL | 4 refills | Status: DC
  Filled 2022-01-18 – 2022-02-01 (×2): qty 30, 30d supply, fill #0

## 2022-01-18 MED ORDER — TRAZODONE HCL 50 MG PO TABS: 50.0000 mg | ORAL_TABLET | Freq: Every evening | ORAL | 0 refills | Status: DC | PRN

## 2022-01-18 MED ORDER — LABETALOL HCL 200 MG PO TABS
400.0000 mg | ORAL_TABLET | Freq: Two times a day (BID) | ORAL | 0 refills | Status: DC
  Filled 2022-01-18 – 2022-02-01 (×2): qty 60, 15d supply, fill #0

## 2022-01-18 MED ORDER — SERTRALINE HCL 25 MG PO TABS
25.0000 mg | ORAL_TABLET | Freq: Every day | ORAL | 2 refills | Status: DC
  Filled 2022-01-18 – 2022-02-01 (×2): qty 30, 30d supply, fill #0

## 2022-01-18 MED ORDER — CLONIDINE HCL 0.3 MG PO TABS
0.3000 mg | ORAL_TABLET | Freq: Three times a day (TID) | ORAL | 0 refills | Status: DC
  Filled 2022-01-18: qty 90, 30d supply, fill #0

## 2022-01-18 NOTE — Patient Instructions (Signed)
Thank you, [name] for allowing [place] to provide your care today. Today we discussed hospital follow up .   ? ?Labs/Tests Ordered: ? ?Lab Orders    ?     BMP8+Anion Gap    ?     CBC no Diff     ? ?Referrals Ordered:  ?Referral for Case Manager ? ?Medication Changes:  ?Medications Discontinued During This Encounter  ?Medication Reason  ? amLODipine (NORVASC) 10 MG tablet Reorder  ? sertraline (ZOLOFT) 25 MG tablet Reorder  ? cloNIDine (CATAPRES) 0.3 MG tablet Reorder  ? labetalol (NORMODYNE) 200 MG tablet Reorder  ? calcium acetate (PHOSLO) 667 MG capsule Reorder  ?  ? ?Meds ordered this encounter  ?Medications  ? calcium acetate (PHOSLO) 667 MG capsule  ?  Sig: Take 1 capsule (667 mg total) by mouth 3 (three) times daily with meals.  ?  Dispense:  90 capsule  ?  Refill:  0  ? labetalol (NORMODYNE) 200 MG tablet  ?  Sig: Take 2 tablets (400 mg total) by mouth 2 (two) times daily.  ?  Dispense:  60 tablet  ?  Refill:  0  ? sertraline (ZOLOFT) 25 MG tablet  ?  Sig: Take 1 tablet (25 mg total) by mouth daily.  ?  Dispense:  30 tablet  ?  Refill:  2  ? amLODipine (NORVASC) 10 MG tablet  ?  Sig: Take 1 tablet (10 mg total) by mouth daily.  ?  Dispense:  30 tablet  ?  Refill:  4  ?  IM program  ? cloNIDine (CATAPRES) 0.3 MG tablet  ?  Sig: Take 1 tablet (0.3 mg total) by mouth 3 (three) times daily.  ?  Dispense:  90 tablet  ?  Refill:  0  ?  ? ?Health Maintenance Screening: ?There are no preventive care reminders to display for this patient.  ? ?Instructions:  ? ?Follow up: 4-6 months  ? ?Remember: If you have any questions or concerns, call our clinic at [phone] or after hours call [phone] and ask for the internal medicine resident on call. ? ?Marianna Payment, D.O. ?[place] ? ? ? ?

## 2022-01-18 NOTE — Progress Notes (Signed)
? ? ?Subjective:  ?CC: HTN ? ?HPI: ? ?[name] is a 46 y.o. male with a past medical history stated below and presents today for HTN. Please see problem based assessment and plan for additional details. ? ?Past Medical History:  ?Diagnosis Date  ? Abnormal EKG 10/10/2021  ? Acute renal failure superimposed on stage 4 chronic kidney disease ([place]) 10/10/2021  ? Alcohol use   ? Alcohol use disorder in remission 11/14/2021  ? Elevated troponin 10/10/2021  ? Epigastric pain 10/10/2021  ? H/O alcohol abuse 10/10/2021  ? History of migraine 11/14/2021  ? Hypertension   ? Hypertensive emergency due to non compliance 10/10/2021  ? Hypokalemia 10/10/2021  ? Left arm numbness and pain 10/10/2021  ? Marijuana use 10/10/2021  ? Noncompliance   ? NSAID long-term use 10/10/2021  ? PRES (posterior reversible encephalopathy syndrome)   ? Smoker 10/10/2021  ? Smoking   ? Syncope, vasovagal 10/10/2021  ? Thrombocytopenia ([place]) 10/10/2021  ? Vision disturbance 11/14/2021  ? Vomiting 10/10/2021  ? ? ?Current Outpatient Medications on File Prior to Visit  ?Medication Sig Dispense Refill  ? acetaminophen (TYLENOL) 325 MG tablet Take 2 tablets (650 mg total) by mouth every 6 (six) hours as needed for fever or mild pain. 30 tablet 0  ? Darbepoetin Alfa (ARANESP) 300 MCG/0.6ML SOSY injection Inject 0.6 mLs (300 mcg total) into the vein every dialysis (Tu,Th,Sat). 1.68 mL 0  ? multivitamin (RENA-VIT) TABS tablet Take 1 tablet by mouth at bedtime. 30 tablet 0  ? pantoprazole (PROTONIX) 40 MG tablet Take 1 tablet (40 mg total) by mouth daily. 30 tablet 4  ? sodium chloride (OCEAN) 0.65 % SOLN nasal spray Place 1 spray into both nostrils as needed for congestion. 15 mL 0  ? ?No current facility-administered medications on file prior to visit.  ? ? ?Family History  ?Problem Relation Age of Onset  ? Hypertension Mother   ? Cervical cancer Mother   ? Breast cancer Sister   ? Sudden Cardiac Death Brother 77  ? Heart disease Maternal Grandmother   ?  Cancer Maternal Grandmother   ? ? ?Social History  ? ?Socioeconomic History  ? Marital status: Single  ?  Spouse name: Not on file  ? Number of children: Not on file  ? Years of education: Not on file  ? Highest education level: Not on file  ?Occupational History  ? Not on file  ?Tobacco Use  ? Smoking status: Every Day  ?  Packs/day: 0.30  ?  Types: Cigarettes  ?  Start date: 20  ? Smokeless tobacco: Not on file  ?Substance and Sexual Activity  ? Alcohol use: Not Currently  ?  Alcohol/week: 12.0 standard drinks  ?  Types: 12 Cans of beer per week  ?  Comment: stopped heavy drinking 2021  ? Drug use: Yes  ?  Types: Marijuana  ? Sexual activity: Not on file  ?Other Topics Concern  ? Not on file  ?Social History Narrative  ? Not on file  ? ?Social Determinants of Health  ? ?Financial Resource Strain: Not on file  ?Food Insecurity: Not on file  ?Transportation Needs: Not on file  ?Physical Activity: Not on file  ?Stress: Not on file  ?Social Connections: Not on file  ?Intimate Partner Violence: Not on file  ? ? ?Review of Systems: ?ROS negative except for what is noted on the assessment and plan. ? ?Objective:  ? ?Vitals:  ? 01/18/22 1057  ?BP: 138/82  ?  Pulse: 67  ?Temp: (!) 97.5 ?F (36.4 ?C)  ?TempSrc: Oral  ?SpO2: 100%  ?Weight: 162 lb 1.9 oz (73.5 kg)  ?Height: 5\' 9"  (1.753 m)  ? ? ?Physical Exam: ?Gen: A&O x3 and in no apparent distress, well appearing and nourished ?MSK: Grossly normal AROM and strength x4 extremities. ?Skin: good skin turgor, no rashes, unusual bruising, or prominent lesions.  ?Neuro: No focal deficits, grossly normal sensation and coordination.  ?Psych: Oriented x3 and responding appropriately. Intact memory, normal mood, judgement, affect, and insight.  ? ? ?Assessment & Plan:  ?See Encounters Tab for problem based charting. ? ?Patient discussed with Dr. Angelia Mould ? ? ?Marianna Payment, D.O. ?[facility]  PGY-3 ?Pager: [phone]  Phone: [phone] ?Date 01/18/2022  Time  2:14 PM ? ?

## 2022-01-18 NOTE — Assessment & Plan Note (Signed)
Repeat CBC. No obvious signs of bleeding on exam. ?

## 2022-01-18 NOTE — Progress Notes (Signed)
Internal Medicine Clinic Attending  Case discussed with Dr. Coe  At the time of the visit.  We reviewed the resident's history and exam and pertinent patient test results.  I agree with the assessment, diagnosis, and plan of care documented in the resident's note.  

## 2022-01-18 NOTE — Assessment & Plan Note (Signed)
Patient had HD yesterday. Follow up BMP today.  ?

## 2022-01-18 NOTE — Assessment & Plan Note (Addendum)
Pt blood pressure is 138/82 today. Tolerating medications well. Refilled BP meds ?

## 2022-01-18 NOTE — Assessment & Plan Note (Signed)
Patient had an abdominal MRI on 12/03/2021 showing a 1.5 cm mildly complex cyst in the left kidney. Radiologist did not give a Bosniak category, but based on the "complex" designation it is at least a Category II. Will need repeat imaging in 6-12 months.  ? ? ?A/P: ?Complex Renal Cyst: ?- Renal US in 6-12 months ?

## 2022-01-18 NOTE — Assessment & Plan Note (Addendum)
Patient presents after a recent hospitalization with symptoms of adjustment disorder. Symptoms 2/2 huge life change after starting HD for ESRD. Patient was started on sertraline and tolerating it well, but states that he has residual symptoms making it difficult to sleep at night. Will do a trial of trazadone. No current ETOH use or history of seizure.  ? ? ?Plan: ?- Trial of trazadone 50 mg q hs ?

## 2022-01-19 LAB — CBC
Hematocrit: 28.1 % — ABNORMAL LOW (ref 37.5–51.0)
Hemoglobin: 9 g/dL — ABNORMAL LOW (ref 13.0–17.7)
MCH: 26.4 pg — ABNORMAL LOW (ref 26.6–33.0)
MCHC: 32 g/dL (ref 31.5–35.7)
MCV: 82 fL (ref 79–97)
Platelets: 227 10*3/uL (ref 150–450)
RBC: 3.41 x10E6/uL — ABNORMAL LOW (ref 4.14–5.80)
RDW: 18.8 % — ABNORMAL HIGH (ref 11.6–15.4)
WBC: 4.6 10*3/uL (ref 3.4–10.8)

## 2022-01-19 LAB — BMP8+ANION GAP
Anion Gap: 13 mmol/L (ref 10.0–18.0)
BUN/Creatinine Ratio: 4 — ABNORMAL LOW (ref 9–20)
BUN: 26 mg/dL — ABNORMAL HIGH (ref 6–24)
CO2: 26 mmol/L (ref 20–29)
Calcium: 8.9 mg/dL (ref 8.7–10.2)
Chloride: 94 mmol/L — ABNORMAL LOW (ref 96–106)
Creatinine, Ser: 6.57 mg/dL — ABNORMAL HIGH (ref 0.76–1.27)
Glucose: 132 mg/dL — ABNORMAL HIGH (ref 70–99)
Potassium: 3.5 mmol/L (ref 3.5–5.2)
Sodium: 133 mmol/L — ABNORMAL LOW (ref 134–144)
eGFR: 10 mL/min/{1.73_m2} — ABNORMAL LOW (ref >59)

## 2022-01-22 ENCOUNTER — Encounter: Payer: Self-pay | Admitting: Internal Medicine

## 2022-01-23 ENCOUNTER — Other Ambulatory Visit: Payer: Self-pay

## 2022-01-23 DIAGNOSIS — N186 End stage renal disease: Secondary | ICD-10-CM

## 2022-01-28 ENCOUNTER — Ambulatory Visit: Payer: Medicaid - Out of State | Admitting: Behavioral Health

## 2022-01-28 DIAGNOSIS — F331 Major depressive disorder, recurrent, moderate: Secondary | ICD-10-CM

## 2022-01-28 DIAGNOSIS — N186 End stage renal disease: Secondary | ICD-10-CM

## 2022-01-28 DIAGNOSIS — F419 Anxiety disorder, unspecified: Secondary | ICD-10-CM

## 2022-01-28 NOTE — BH Specialist Note (Signed)
Integrated Behavioral Health via Telemedicine Visit ? ?01/28/2022 ?Yasir L Weyers ?[id number] ? ?Number of [facility] Clinician visits: 1 ?Session Start time: 1000 ?Session End time: 4403 ?Total time in minutes: 30 min ? ?Referring Provider: Dr. Jake Shark, DO ?Patient/Family location: Pt  in [place] w/his Wife from [place] ?[facility] Provider location: Working remotely ?All persons participating in visit: Pt & Clinician ?Types of Service: Individual psychotherapy ? ?I connected with Media and/or [place]  self  via  Telephone or Video Enabled Telemedicine Application  (Video is Caregility application) and verified that I am speaking with the correct person using two identifiers. Discussed confidentiality: Yes  ? ?I discussed the limitations of telemedicine and the availability of in person appointments.  Discussed there is a possibility of technology failure and discussed alternative modes of communication if that failure occurs. ? ?I discussed that engaging in this telemedicine visit, they consent to the provision of behavioral healthcare and the services will be billed under their insurance. ? ?Patient and/or legal guardian expressed understanding and consented to Telemedicine visit: Yes  ? 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 ?Patient and/or Family's Strengths/Protective Factors: ?Social connections, Social and Patent attorney, Concrete supports in place (healthy food, safe environments, etc.), Sense of purpose, and Physical Health (exercise, healthy diet, medication compliance, etc.) ? ?Goals Addressed: ?Patient will: ? Reduce symptoms of: anxiety, depression, and stress  ? Increase knowledge and/or ability of: coping skills, healthy habits, and stress reduction  ?  Demonstrate ability to: Increase healthy adjustment to current life circumstances ? ?Progress towards Goals: ?Estb'd today: Pt will attend next 3 sessions of therapy to assist in his coping & healthy habits for his T2DM ? ?Interventions: ?Interventions utilized:  Motivational Interviewing and Supportive Counseling ?Standardized Assessments completed: Not Needed ? ?Patient and/or Family Response: Pt is receptive to call today & agrees to future calls ? ?Assessment: ?Patient currently experiencing elevated anx/dep due to housing insecurity & the critical need for Dialysis for T2 DM treatment.  ? ?Pt has moved from [facility] in the past year & learning the Healthcare Syst in [place]. ? ?Patient may benefit from cont'd Cslg. ? ?Plan: ?Follow up with behavioral health clinician on : 2-3 wks for 30 min ck-in on telehealth ?Behavioral recommendations: None today ?Referral(s): [place] (In Clinic) ? ?I discussed the assessment and treatment plan with the patient and/or parent/guardian. They were provided an opportunity to ask questions and all were answered. They agreed with the plan and demonstrated an understanding of the instructions. ?  ?They were advised to call back or seek an in-person evaluation if the symptoms worsen or if the condition fails to improve as anticipated. ? ?Donnetta Hutching, LMFT ?

## 2022-01-29 ENCOUNTER — Other Ambulatory Visit (HOSPITAL_COMMUNITY): Payer: Self-pay

## 2022-01-30 ENCOUNTER — Emergency Department (HOSPITAL_COMMUNITY)
Admission: EM | Admit: 2022-01-30 | Discharge: 2022-01-30 | Disposition: A | Discharge status: Home / Self Care | Payer: Medicaid - Out of State | Attending: Emergency Medicine | Admitting: Emergency Medicine

## 2022-01-30 ENCOUNTER — Other Ambulatory Visit: Payer: Self-pay

## 2022-01-30 ENCOUNTER — Emergency Department (HOSPITAL_COMMUNITY): Payer: Medicaid - Out of State

## 2022-01-30 DIAGNOSIS — Z992 Dependence on renal dialysis: Secondary | ICD-10-CM | POA: Insufficient documentation

## 2022-01-30 DIAGNOSIS — F17200 Nicotine dependence, unspecified, uncomplicated: Secondary | ICD-10-CM | POA: Insufficient documentation

## 2022-01-30 DIAGNOSIS — I120 Hypertensive chronic kidney disease with stage 5 chronic kidney disease or end stage renal disease: Secondary | ICD-10-CM | POA: Insufficient documentation

## 2022-01-30 DIAGNOSIS — Z79899 Other long term (current) drug therapy: Secondary | ICD-10-CM | POA: Insufficient documentation

## 2022-01-30 DIAGNOSIS — R079 Chest pain, unspecified: Secondary | ICD-10-CM | POA: Insufficient documentation

## 2022-01-30 DIAGNOSIS — R778 Other specified abnormalities of plasma proteins: Secondary | ICD-10-CM | POA: Insufficient documentation

## 2022-01-30 DIAGNOSIS — I10 Essential (primary) hypertension: Secondary | ICD-10-CM

## 2022-01-30 DIAGNOSIS — R0789 Other chest pain: Secondary | ICD-10-CM | POA: Insufficient documentation

## 2022-01-30 DIAGNOSIS — N186 End stage renal disease: Secondary | ICD-10-CM | POA: Insufficient documentation

## 2022-01-30 LAB — CBC
HCT: 27.8 % — ABNORMAL LOW (ref 39.0–52.0)
Hemoglobin: 9 g/dL — ABNORMAL LOW (ref 13.0–17.0)
MCH: 27 pg (ref 26.0–34.0)
MCHC: 32.4 g/dL (ref 30.0–36.0)
MCV: 83.5 fL (ref 80.0–100.0)
Platelets: 202 10*3/uL (ref 150–400)
RBC: 3.33 MIL/uL — ABNORMAL LOW (ref 4.22–5.81)
RDW: 19.3 % — ABNORMAL HIGH (ref 11.5–15.5)
WBC: 6.1 10*3/uL (ref 4.0–10.5)
nRBC: 0 % (ref 0.0–0.2)

## 2022-01-30 LAB — BASIC METABOLIC PANEL
Anion gap: 10 (ref 5–15)
BUN: 37 mg/dL — ABNORMAL HIGH (ref 6–20)
CO2: 30 mmol/L (ref 22–32)
Calcium: 8.7 mg/dL — ABNORMAL LOW (ref 8.9–10.3)
Chloride: 93 mmol/L — ABNORMAL LOW (ref 98–111)
Creatinine, Ser: 7.31 mg/dL — ABNORMAL HIGH (ref 0.61–1.24)
GFR, Estimated: 9 mL/min — ABNORMAL LOW (ref >60)
Glucose, Bld: 96 mg/dL (ref 70–99)
Potassium: 3.2 mmol/L — ABNORMAL LOW (ref 3.5–5.1)
Sodium: 133 mmol/L — ABNORMAL LOW (ref 135–145)

## 2022-01-30 LAB — CBG MONITORING, ED: Glucose-Capillary: 95 mg/dL (ref 70–99)

## 2022-01-30 LAB — PROTIME-INR
INR: 1.2 (ref 0.8–1.2)
Prothrombin Time: 14.7 seconds (ref 11.4–15.2)

## 2022-01-30 LAB — TROPONIN I (HIGH SENSITIVITY)
Troponin I (High Sensitivity): 34 ng/L — ABNORMAL HIGH (ref <18)
Troponin I (High Sensitivity): 34 ng/L — ABNORMAL HIGH (ref <18)

## 2022-01-30 MED ORDER — CLONIDINE HCL 0.3 MG PO TABS: 0.3000 mg | ORAL_TABLET | Freq: Three times a day (TID) | ORAL | 0 refills | Status: DC

## 2022-01-30 MED ORDER — CLONIDINE HCL 0.1 MG PO TABS
0.3000 mg | ORAL_TABLET | Freq: Once | ORAL | Status: AC
  Administered 2022-01-30: 0.3 mg via ORAL
  Filled 2022-01-30: qty 3

## 2022-01-30 MED ORDER — NITROGLYCERIN 0.4 MG SL SUBL
0.4000 mg | SUBLINGUAL_TABLET | Freq: Once | SUBLINGUAL | Status: AC
  Administered 2022-01-30: 0.4 mg via SUBLINGUAL
  Filled 2022-01-30: qty 1

## 2022-01-30 MED ORDER — LABETALOL HCL 200 MG PO TABS
200.0000 mg | ORAL_TABLET | Freq: Two times a day (BID) | ORAL | Status: DC
  Filled 2022-01-30: qty 1

## 2022-01-30 MED ORDER — ASPIRIN 81 MG PO CHEW
324.0000 mg | CHEWABLE_TABLET | Freq: Once | ORAL | Status: AC
  Administered 2022-01-30: 324 mg via ORAL
  Filled 2022-01-30: qty 4

## 2022-01-30 MED ORDER — CLONIDINE HCL 0.1 MG PO TABS
0.3000 mg | ORAL_TABLET | Freq: Once | ORAL | Status: DC
Start: 2022-01-30 — End: 2022-01-30
  Filled 2022-01-30: qty 3

## 2022-01-30 NOTE — ED Triage Notes (Signed)
Pt states he started having chest pain 3 days ago. States today he feels shortness of breath and chest pain that is heavy and tight.  ?

## 2022-01-30 NOTE — ED Provider Notes (Signed)
?[facility] DEPT ?Provider Note ? ? ?CSN: [id number] ?Arrival date & time: 01/30/22  1834 ? ?  ? ?History ? ?Chief Complaint  ?Patient presents with  ? Chest Pain  ? ? ?Dontez L Castagna is a 46 y.o. male.  Presented to emergency room due to concern for chest pain.  States that he had chest pain that started 3 days ago.  Has been relatively constant, central, nonradiating, no obvious alleviating or aggravating factors.  Today feels more short of breath.  Pain is up to 8 out of 10 in severity, pressure sensation.  Endorses compliance with all of his medications except ran out of his clonidine and has not taken his clonidine today. ? ?Review chart, admission in January, March, April. essential hypertension, active smoker, history of alcohol abuse-sober since 1 year, from [place]. ESRD now on dialysis.  ? ?Echo in Jan 2023 -  ? 1. Left ventricular ejection fraction, by estimation, is 55%. The left  ?ventricle has normal function. The left ventricle has no regional wall  ?motion abnormalities. There is mild left ventricular hypertrophy. Left  ?ventricular diastolic parameters are  ?consistent with Grade II diastolic dysfunction (pseudonormalization).  ? ?HPI ? ?  ? ?Home Medications ?Prior to Admission medications   ?Medication Sig Start Date End Date Taking? Authorizing Provider  ?acetaminophen (TYLENOL) 325 MG tablet Take 2 tablets (650 mg total) by mouth every 6 (six) hours as needed for fever or mild pain. 01/02/22  Yes Katsadouros, Vasilios, MD  ?amLODipine (NORVASC) 10 MG tablet Take 1 tablet (10 mg total) by mouth daily. 01/18/22  Yes Marianna Payment, MD  ?calcium acetate (PHOSLO) 667 MG capsule Take 1 capsule (667 mg total) by mouth 3 (three) times daily with meals. 01/18/22  Yes Marianna Payment, MD  ?Darbepoetin Alfa (ARANESP) 300 MCG/0.6ML SOSY injection Inject 0.6 mLs (300 mcg total) into the vein every dialysis (Tu,Th,Sat). 01/02/22  Yes Katsadouros, Vasilios, MD  ?labetalol (NORMODYNE)  200 MG tablet Take 2 tablets (400 mg total) by mouth 2 (two) times daily. 01/18/22  Yes Marianna Payment, MD  ?multivitamin (RENA-VIT) TABS tablet Take 1 tablet by mouth at bedtime. 01/02/22  Yes Katsadouros, Vasilios, MD  ?pantoprazole (PROTONIX) 40 MG tablet Take 1 tablet (40 mg total) by mouth daily. 12/07/21  Yes Ghimire, Henreitta Leber, MD  ?sertraline (ZOLOFT) 25 MG tablet Take 1 tablet (25 mg total) by mouth daily. 01/18/22  Yes Marianna Payment, MD  ?sodium chloride (OCEAN) 0.65 % SOLN nasal spray Place 1 spray into both nostrils as needed for congestion. ?Patient taking differently: Place 1 spray into both nostrils daily as needed for congestion. 12/20/21  Yes Masters, Joellen Jersey, DO  ?cloNIDine (CATAPRES) 0.3 MG tablet Take 1 tablet (0.3 mg total) by mouth 3 (three) times daily. 01/30/22   Lucrezia Starch, MD  ?traZODone (DESYREL) 50 MG tablet Take 1 tablet (50 mg total) by mouth at bedtime as needed for sleep. ?Patient not taking: Reported on 01/30/2022 01/18/22 02/17/22  Marianna Payment, MD  ?   ? ?Allergies    ?Cucumber extract, Other, Tomato (diagnostic), Wild lettuce [wild lettuce extract (lactuca virosa)], and Hydralazine   ? ?Review of Systems   ?Review of Systems  ?Constitutional:  Negative for chills and fever.  ?HENT:  Negative for ear pain and sore throat.   ?Eyes:  Negative for pain and visual disturbance.  ?Respiratory:  Positive for chest tightness. Negative for cough and shortness of breath.   ?Cardiovascular:  Positive for chest pain. Negative for  palpitations.  ?Gastrointestinal:  Negative for abdominal pain and vomiting.  ?Genitourinary:  Negative for dysuria and hematuria.  ?Musculoskeletal:  Negative for arthralgias and back pain.  ?Skin:  Negative for color change and rash.  ?Neurological:  Negative for seizures and syncope.  ?All other systems reviewed and are negative. ? ?Physical Exam ?Updated Vital Signs ?BP (!) 145/94   Pulse 73   Temp 98.4 ?F (36.9 ?C) (Oral)   Resp 17   Ht 5' 9"  (1.753 m)   Wt 73 kg    SpO2 96%   BMI 23.78 kg/m?  ?Physical Exam ?Vitals and nursing note reviewed.  ?Constitutional:   ?   General: He is not in acute distress. ?   Appearance: He is well-developed.  ?HENT:  ?   Head: Normocephalic and atraumatic.  ?Eyes:  ?   Conjunctiva/sclera: Conjunctivae normal.  ?Cardiovascular:  ?   Rate and Rhythm: Normal rate and regular rhythm.  ?   Heart sounds: No murmur heard. ?Pulmonary:  ?   Effort: Pulmonary effort is normal. No respiratory distress.  ?   Breath sounds: Normal breath sounds.  ?Chest:  ?   Chest wall: No mass.  ?Abdominal:  ?   Palpations: Abdomen is soft.  ?   Tenderness: There is no abdominal tenderness.  ?Musculoskeletal:     ?   General: No swelling.  ?   Cervical back: Neck supple.  ?Skin: ?   General: Skin is warm and dry.  ?   Capillary Refill: Capillary refill takes less than 2 seconds.  ?Neurological:  ?   Mental Status: He is alert.  ?Psychiatric:     ?   Mood and Affect: Mood normal.  ? ? ?ED Results / Procedures / Treatments   ?Labs ?(all labs ordered are listed, but only abnormal results are displayed) ?Labs Reviewed  ?BASIC METABOLIC PANEL - Abnormal; Notable for the following components:  ?    Result Value  ? Sodium 133 (*)   ? Potassium 3.2 (*)   ? Chloride 93 (*)   ? BUN 37 (*)   ? Creatinine, Ser 7.31 (*)   ? Calcium 8.7 (*)   ? GFR, Estimated 9 (*)   ? All other components within normal limits  ?CBC - Abnormal; Notable for the following components:  ? RBC 3.33 (*)   ? Hemoglobin 9.0 (*)   ? HCT 27.8 (*)   ? RDW 19.3 (*)   ? All other components within normal limits  ?TROPONIN I (HIGH SENSITIVITY) - Abnormal; Notable for the following components:  ? Troponin I (High Sensitivity) 34 (*)   ? All other components within normal limits  ?TROPONIN I (HIGH SENSITIVITY) - Abnormal; Notable for the following components:  ? Troponin I (High Sensitivity) 34 (*)   ? All other components within normal limits  ?PROTIME-INR  ?CBG MONITORING, ED  ? ? ?EKG ?EKG  Interpretation ? ?Date/Time:  Wednesday Jan 30 2022 20:00:46 EDT ?Ventricular Rate:  80 ?PR Interval:  149 ?QRS Duration: 102 ?QT Interval:  444 ?QTC Calculation: 513 ?R Axis:   100 ?Text Interpretation: Sinus rhythm Right axis deviation Consider left ventricular hypertrophy Probable lateral infarct, age indeterminate Abnrm T, consider ischemia, anterolateral lds Prolonged QT interval Confirmed by Madalyn Rob [phone]) on 01/30/2022 10:39:30 PM ? ?Radiology ?DG Chest Portable 1 View ? ?Result Date: 01/30/2022 ?CLINICAL DATA:  Chest pain. EXAM: PORTABLE CHEST 1 VIEW COMPARISON:  Chest radiograph dated 12/26/2021. FINDINGS: Dialysis catheter with tip at the cavoatrial junction.  There is mild cardiomegaly with mild central vascular congestion. Probable trace right pleural effusion. No focal consolidation or pneumothorax. No acute osseous pathology. IMPRESSION: Mild cardiomegaly with mild central vascular congestion and probable trace right pleural effusion. Electronically Signed   By: Anner Crete M.D.   On: 01/30/2022 19:31   ? ?Procedures ?Procedures  ? ? ?Medications Ordered in ED ?Medications  ?labetalol (NORMODYNE) tablet 200 mg (200 mg Oral Not Given 01/30/22 2249)  ?nitroGLYCERIN (NITROSTAT) SL tablet 0.4 mg (0.4 mg Sublingual Given 01/30/22 1923)  ?aspirin chewable tablet 324 mg (324 mg Oral Given 01/30/22 1921)  ?cloNIDine (CATAPRES) tablet 0.3 mg (0.3 mg Oral Given 01/30/22 2036)  ? ? ?ED Course/ Medical Decision Making/ A&P ?Clinical Course as of 01/30/22 2330  ?Wed Jan 30, 2022  ?1858 I reviewed ECG with Dr. Ellyn Hack.  He does not feel this met STEMI criteria and recommends not initiating STEMI alert.  [RD]  ?  ?Clinical Course User Index ?[RD] Lucrezia Starch, MD  ? ?                        ?Medical Decision Making ?Amount and/or Complexity of Data Reviewed ?Labs: ordered. ?Radiology: ordered. ? ?Risk ?OTC drugs. ?Prescription drug management. ? ? ?46 year old gentleman with history of hypertension, ESRD on  dialysis presenting to the ER due to concern for chest pain.  Pain ongoing for the past few days.  Initial BP elevated 430T systolic.  EKG with new T wave inversion and some ST segment changes.  I discussed the case with the STEMI

## 2022-01-30 NOTE — ED Notes (Signed)
Dr. Roslynn Amble bedside.  ?

## 2022-01-30 NOTE — Discharge Instructions (Addendum)
Please follow-up with cardiology.  If you have recurrent chest pain, difficulty in breathing, please return to the emergency room for reassessment.  Please take your regular blood pressure medicine as previously prescribed. ?

## 2022-01-30 NOTE — ED Notes (Signed)
An After Visit Summary was printed and given to the patient. Discharge instructions given and no further questions at this time.  

## 2022-02-01 ENCOUNTER — Other Ambulatory Visit (HOSPITAL_COMMUNITY): Payer: Self-pay

## 2022-02-01 ENCOUNTER — Other Ambulatory Visit: Payer: Self-pay | Admitting: Internal Medicine

## 2022-02-01 ENCOUNTER — Other Ambulatory Visit: Payer: Self-pay | Admitting: Student

## 2022-02-01 DIAGNOSIS — F419 Anxiety disorder, unspecified: Secondary | ICD-10-CM

## 2022-02-01 DIAGNOSIS — I10 Essential (primary) hypertension: Secondary | ICD-10-CM

## 2022-02-01 DIAGNOSIS — G479 Sleep disorder, unspecified: Secondary | ICD-10-CM

## 2022-02-01 MED ORDER — LABETALOL HCL 200 MG PO TABS
400.0000 mg | ORAL_TABLET | Freq: Two times a day (BID) | ORAL | 0 refills | Status: DC
  Filled 2022-02-01: qty 120, 30d supply, fill #0
  Filled 2022-02-01: qty 60, 15d supply, fill #0

## 2022-02-01 MED ORDER — CLONIDINE HCL 0.3 MG PO TABS
0.3000 mg | ORAL_TABLET | Freq: Three times a day (TID) | ORAL | 0 refills | Status: DC
  Filled 2022-02-01: qty 90, 30d supply, fill #0

## 2022-02-01 MED ORDER — LABETALOL HCL 200 MG PO TABS
400.0000 mg | ORAL_TABLET | Freq: Two times a day (BID) | ORAL | 0 refills | Status: DC
Start: 2022-02-01 — End: 2022-02-01
  Filled 2022-02-01: qty 60, 15d supply, fill #0

## 2022-02-01 MED ORDER — TRAZODONE HCL 50 MG PO TABS
50.0000 mg | ORAL_TABLET | Freq: Every evening | ORAL | 0 refills | Status: DC | PRN
  Filled 2022-02-01: qty 30, 30d supply, fill #0

## 2022-02-01 NOTE — Telephone Encounter (Signed)
Trazodone and clonidine scripts sent to pharmacy as "print". Outpatient pharmacy requesting these be resent as "normal"-- resent to pharmacy. ?

## 2022-02-01 NOTE — Telephone Encounter (Signed)
Call from pt who stated [facility] did not receive rxs for Trazodone and Clonidine. They were refilled as "Print". Please re-send electronically. Thanks ?

## 2022-02-04 ENCOUNTER — Ambulatory Visit (INDEPENDENT_AMBULATORY_CARE_PROVIDER_SITE_OTHER): Payer: Self-pay | Admitting: Internal Medicine

## 2022-02-04 DIAGNOSIS — R072 Precordial pain: Secondary | ICD-10-CM

## 2022-02-04 DIAGNOSIS — N186 End stage renal disease: Secondary | ICD-10-CM

## 2022-02-04 DIAGNOSIS — I10 Essential (primary) hypertension: Secondary | ICD-10-CM

## 2022-02-04 DIAGNOSIS — D631 Anemia in chronic kidney disease: Secondary | ICD-10-CM

## 2022-02-04 DIAGNOSIS — N185 Chronic kidney disease, stage 5: Secondary | ICD-10-CM

## 2022-02-04 NOTE — Progress Notes (Signed)
Cardiology Office Note:    Date:  12/13/2022   ID:  Roy Randolph, DOB Mar 21, 1976, MRN [id number]  PCP:  Ladell Pier, MD   [facility] Providers Cardiologist:  Lenna Sciara, MD Referring MD: Lucrezia Starch, MD   Chief Complaint/Reason for Referral: Chest pain and hypertension  PATIENT DID NOT APPEAR FOR APPOINTMENT   ASSESSMENT:    1. Precordial pain   2. End stage kidney disease ([place])   3. Essential hypertension   4. Anemia of chronic kidney failure, stage 5 (HCC)     PLAN:    In order of problems listed above: 1.  Precordial pain:   2.  End-stage renal disease: This is being followed by other providers. 3.  Hypertension: 4.  Anemia of chronic disease:            History of Present Illness:    FOCUSED PROBLEM LIST:   1.  End-stage renal disease on hemodialysis 2.  Hypertension 3.  Anemia of chronic disease 4.  Ongoing tobacco abuse 5.  Prior alcohol abuse  The patient is a 46 y.o. male with the indicated medical history here for emergency room follow-up.  The patient was seen recently in the emergency department due to chest pain.  At that time her blood pressure was 145/94.  Her cardiac biomarkers were mildly abnormal.  An EKG left ventricular hypertrophy with repolarization abnormalities.  A chest x-ray demonstrated mild cardiomegaly and mild central vascular congestion.  Her other laboratories were remarkable for creatinine of 7.31, hemoglobin of 9, and normal glucose level.  She was administered aspirin, as needed nitroglycerin, and clonidine.          Current Medications: No outpatient medications have been marked as taking for the 02/04/22 encounter (Office Visit) with Early Osmond, MD.     Allergies:    Cucumber extract, Other, Tomato (diagnostic), Wild lettuce [wild lettuce extract (lactuca virosa)], and Hydralazine   Social History:   Social History   Tobacco Use   Smoking status: Some Days    Packs/day: .3    Types:  Cigarettes    Start date: 1998   Tobacco comments:    Stopped in January  Vaping Use   Vaping Use: Never used  Substance Use Topics   Alcohol use: Not Currently    Alcohol/week: 12.0 standard drinks of alcohol    Types: 12 Cans of beer per week    Comment: stopped heavy drinking 2021   Drug use: Yes    Types: Marijuana     Family Hx: Family History  Problem Relation Age of Onset   Hypertension Mother    Cervical cancer Mother    Breast cancer Sister    Sudden Cardiac Death Brother 73   Heart disease Maternal Grandmother    Cancer Maternal Grandmother      Review of Systems:   Please see the history of present illness.    All other systems reviewed and are negative.     EKGs/Labs/Other Test Reviewed:    EKG:  EKG performed January 01, 2022 that I personally reviewed demonstrates sinus rhythm with left ventricular hypertrophy and repolarization abnormalities  Prior CV studies:  TTE 2023 demonstrates an ejection fraction of 55% with mild left ventricular hypertrophy, grade 2 diastolic dysfunction, no significant valvular abnormalities.  Other studies Reviewed: Review of the additional studies/records demonstrates: MR abdomen 2023 without aortic atherosclerosis or aneurysm  Recent Labs: 12/05/2022: ALT 13; BUN 49; Creatinine, Ser 6.48; Hemoglobin 10.7; Platelets 222; Potassium  3.8; Sodium 134   Recent Lipid Panel Lab Results  Component Value Date/Time   TRIG 370 (H) 12/02/2021 03:05 AM    Risk Assessment/Calculations:     Physical Exam:      Signed, Early Osmond, MD  12/13/2022 3:57 PM    [facility] [place], [place], [place]  [address] Phone: [phone]; Fax: [phone]   Note:  This document was prepared using Dragon voice recognition software and may include unintentional dictation errors.

## 2022-02-05 NOTE — Progress Notes (Signed)
?Cardiology Office Note:   ? ?Date:  02/08/2022  ? ?ID:  Roy Randolph, DOB 06-13-1976, MRN [id number] ? ?PCP:  Roy Fuchs, DO  ? ?[facility] Providers ?Cardiologist:  Roy Sciara, MD ?Referring MD: Roy Starch, MD  ? ?Chief Complaint/Reason for Referral:  Chest pain, HTN ? ?ASSESSMENT:   ? ?1. Precordial pain   ?2. End stage kidney disease ([name])   ?3. Essential hypertension   ?4. Anemia of chronic kidney failure, stage 5 (HCC)   ? ? ?PLAN:   ? ?In order of problems listed above: ?1.  Chest pain: This was due to volume overload and high LVEDP.  His chest x-ray in the emergency department demonstrated pulmonary edema and the patient admits to increased liquid intake over that weekend.  He has not had any pain since dialysis.  We will continue to monitor for now.  Follow-up in 6 months or earlier if needed. ?2.  End-stage renal disease: This is being followed by other providers. ?3.  Hypertension: Blood pressures mildly elevated above goal.  If he is compliant with dialysis and does not miss sessions an ACE inhibitor or ARB could be considered. ?4.  Anemia of chronic disease: CBC recently was significantly abnormal.  Hemoglobin was around 9 which is acceptable. ? ? ?     ? ?   ? ?Dispo:  Return in about 6 months (around 08/11/2022).  ? ?  ? ?Medication Adjustments/Labs and Tests Ordered: ?Current medicines are reviewed at length with the patient today.  Concerns regarding medicines are outlined above. ? ?The following changes have been made:  no change  ? ?Labs/tests ordered: ?No orders of the defined types were placed in this encounter. ? ? ?Medication Changes: ?No orders of the defined types were placed in this encounter. ? ? ? ?Current medicines are reviewed at length with the patient today.  The patient does not have concerns regarding medicines. ? ? ?History of Present Illness:   ? ?FOCUSED PROBLEM LIST:   ?1.  End-stage renal disease on hemodialysis ([name]) ?2.  Hypertension ?3.  Anemia  of chronic disease ?4.  Ongoing tobacco abuse ?5.  Prior alcohol abuse ?  ?The patient is a 46 y.o. male with the indicated medical history here for emergency room follow-up.  The patient was seen recently in the emergency department due to chest pain.  At that time her blood pressure was 145/94.  Her cardiac biomarkers were mildly abnormal.  An EKG left ventricular hypertrophy with repolarization abnormalities.  A chest x-ray demonstrated mild cardiomegaly and mild central vascular congestion.  Her other laboratories were remarkable for creatinine of 7.31, hemoglobin of 9, and normal glucose level.  She was administered aspirin, as needed nitroglycerin, and clonidine. ? ?When questioned about this episode further apparently this happened on Sunday night into Monday.  This was during his birthday weekend when the patient ingested more fluid than he normally does.  Once he started dialysis on Tuesday following his presentation emergency department he has no longer had any chest discomfort.  Apparently the chest discomfort came on at rest and was associated with shortness of breath.  He has had no exertional angina, exertional dyspnea, or any problems with completing dialysis due to angina. ? ? ?Current Medications: ?Current Meds  ?Medication Sig  ? acetaminophen (TYLENOL) 325 MG tablet Take 2 tablets (650 mg total) by mouth every 6 (six) hours as needed for fever or mild pain.  ? amLODipine (NORVASC) 10 MG tablet Take 1 tablet (10  mg total) by mouth daily.  ? calcium acetate (PHOSLO) 667 MG capsule Take 1 capsule (667 mg total) by mouth 3 (three) times daily with meals.  ? cloNIDine (CATAPRES) 0.3 MG tablet Take 1 tablet (0.3 mg total) by mouth 3 (three) times daily.  ? Darbepoetin Alfa (ARANESP) 300 MCG/0.6ML SOSY injection Inject 0.6 mLs (300 mcg total) into the vein every dialysis (Tu,Th,Sat).  ? labetalol (NORMODYNE) 200 MG tablet Take 2 tablets (400 mg total) by mouth 2 (two) times daily.  ? pantoprazole  (PROTONIX) 40 MG tablet Take 1 tablet (40 mg total) by mouth daily.  ? sertraline (ZOLOFT) 25 MG tablet Take 1 tablet (25 mg total) by mouth daily.  ? sodium chloride (OCEAN) 0.65 % SOLN nasal spray Place 1 spray into both nostrils as needed for congestion. (Patient taking differently: Place 1 spray into both nostrils daily as needed for congestion.)  ? traZODone (DESYREL) 50 MG tablet Take 1 tablet (50 mg total) by mouth at bedtime as needed for sleep.  ? Vitamin D, Ergocalciferol, (DRISDOL) 1.25 MG (50000 UNIT) CAPS capsule Take 1 capsule (50,000 Units total) by mouth once a week.  ? [DISCONTINUED] multivitamin (RENA-VIT) TABS tablet Take 1 tablet by mouth at bedtime.  ?  ? ?Allergies:    ?Cucumber extract, Other, Tomato (diagnostic), Wild lettuce [wild lettuce extract (lactuca virosa)], and Hydralazine  ? ?Social History:   ?Social History  ? ?Tobacco Use  ? Smoking status: Every Day  ?  Packs/day: 0.30  ?  Types: Cigarettes  ?  Start date: 57  ?Substance Use Topics  ? Alcohol use: Not Currently  ?  Alcohol/week: 12.0 standard drinks  ?  Types: 12 Cans of beer per week  ?  Comment: stopped heavy drinking 2021  ? Drug use: Yes  ?  Types: Marijuana  ?  ? ?Family Hx: ?Family History  ?Problem Relation Age of Onset  ? Hypertension Mother   ? Cervical cancer Mother   ? Breast cancer Sister   ? Sudden Cardiac Death Brother 24  ? Heart disease Maternal Grandmother   ? Cancer Maternal Grandmother   ?  ? ?Review of Systems:   ?Please see the history of present illness.    ?All other systems reviewed and are negative. ?  ? ? ?EKGs/Labs/Other Test Reviewed:   ? ?EKG:  EKG performed January 01, 2022 that I personally reviewed demonstrates sinus rhythm with left ventricular hypertrophy and repolarization abnormalities ?  ?Prior CV studies: ?  ?TTE 2023 demonstrates an ejection fraction of 55% with mild left ventricular hypertrophy, grade 2 diastolic dysfunction, no significant valvular abnormalities. ?  ?Other studies  Reviewed: ?Review of the additional studies/records demonstrates: MR abdomen 2023 without aortic atherosclerosis or aneurysm ? ?Recent Labs: ?10/10/2021: TSH 1.002 ?12/04/2021: Magnesium 2.0 ?12/24/2021: ALT 13 ?01/30/2022: BUN 37; Creatinine, Ser 7.31; Hemoglobin 9.0; Platelets 202; Potassium 3.2; Sodium 133  ? ?Recent Lipid Panel ?Lab Results  ?Component Value Date/Time  ? TRIG 370 (H) 12/02/2021 03:05 AM  ? ? ?Risk Assessment/Calculations:   ? ?  ?    ? ?Physical Exam:   ? ?VS:  BP (!) 144/94   Pulse 68   Ht 5\' 9"  (1.753 m)   Wt 159 lb (72.1 kg)   SpO2 99%   BMI 23.48 kg/m?    ?Wt Readings from Last 3 Encounters:  ?02/08/22 159 lb (72.1 kg)  ?01/30/22 161 lb (73 kg)  ?01/18/22 162 lb 1.9 oz (73.5 kg)  ?  ?GENERAL:  No  apparent distress, AOx3 ?HEENT:  No carotid bruits, +2 carotid impulses, no scleral icterus ?CAR: RRR no murmurs, gallops, rubs, or thrills; HD catheter in right upper chest ?RES:  Clear to auscultation bilaterally ?ABD:  Soft, nontender, nondistended, positive bowel sounds x 4 ?VASC:  +2 radial pulses, +2 carotid pulses, palpable pedal pulses; left upper arm fistula ?NEURO:  CN 2-12 grossly intact; motor and sensory grossly intact ?PSYCH:  No active depression or anxiety ?EXT:  No edema, ecchymosis, or cyanosis ? ?Signed, ?Early Osmond, MD  ?02/08/2022 10:46 AM    ?[facility] ?[place], [place], [place]  [address] ?Phone: [phone]; Fax: [phone]  ? ?Note:  This document was prepared using Dragon voice recognition software and may include unintentional dictation errors. ?

## 2022-02-06 ENCOUNTER — Encounter (HOSPITAL_COMMUNITY): Payer: Medicaid Other

## 2022-02-06 ENCOUNTER — Encounter: Payer: Medicaid Other | Admitting: Vascular Surgery

## 2022-02-07 ENCOUNTER — Other Ambulatory Visit (HOSPITAL_COMMUNITY): Payer: Self-pay

## 2022-02-07 MED ORDER — VITAMIN D (ERGOCALCIFEROL) 1.25 MG (50000 UNIT) PO CAPS
50000.0000 [IU] | ORAL_CAPSULE | ORAL | 0 refills | Status: DC
  Filled 2022-02-07: qty 12, 84d supply, fill #0

## 2022-02-08 ENCOUNTER — Ambulatory Visit (INDEPENDENT_AMBULATORY_CARE_PROVIDER_SITE_OTHER): Payer: Self-pay | Admitting: Internal Medicine

## 2022-02-08 ENCOUNTER — Encounter: Payer: Self-pay | Admitting: Internal Medicine

## 2022-02-08 VITALS — BP 144/94 | HR 68 | Ht 69.0 in | Wt 159.0 lb

## 2022-02-08 DIAGNOSIS — N186 End stage renal disease: Secondary | ICD-10-CM

## 2022-02-08 DIAGNOSIS — R072 Precordial pain: Secondary | ICD-10-CM

## 2022-02-08 DIAGNOSIS — I10 Essential (primary) hypertension: Secondary | ICD-10-CM

## 2022-02-08 DIAGNOSIS — N185 Chronic kidney disease, stage 5: Secondary | ICD-10-CM

## 2022-02-08 DIAGNOSIS — D631 Anemia in chronic kidney disease: Secondary | ICD-10-CM

## 2022-02-08 NOTE — Patient Instructions (Signed)
Medication Instructions:  ?No changes ?*If you need a refill on your cardiac medications before your next appointment, please call your pharmacy* ? ? ?Lab Work: ?none ?If you have labs (blood work) drawn today and your tests are completely normal, you will receive your results only by: ?MyChart Message (if you have MyChart) OR ?A paper copy in the mail ?If you have any lab test that is abnormal or we need to change your treatment, we will call you to review the results. ? ? ?Testing/Procedures: ?none ? ? ?Follow-Up: ?At [facility], you and your health needs are our priority.  As part of our continuing mission to provide you with exceptional heart care, we have created designated Provider Care Teams.  These Care Teams include your primary Cardiologist (physician) and Advanced Practice Providers (APPs -  Physician Assistants and Nurse Practitioners) who all work together to provide you with the care you need, when you need it. ? ?We recommend signing up for the patient portal called "MyChart".  Sign up information is provided on this After Visit Summary.  MyChart is used to connect with patients for Virtual Visits (Telemedicine).  Patients are able to view lab/test results, encounter notes, upcoming appointments, etc.  Non-urgent messages can be sent to your provider as well.   ?To learn more about what you can do with MyChart, go to [email or web address].   ? ?Your next appointment:   ?6 month(s) ? ?The format for your next appointment:   ?In Person ? ?Provider:   ?Early Osmond, MD   ? ? ?Important Information About Sugar ? ? ? ? ?  ?

## 2022-02-11 ENCOUNTER — Telehealth: Payer: Self-pay | Admitting: *Deleted

## 2022-02-11 NOTE — Telephone Encounter (Signed)
Call from pt requesting an appt. Stated the staff at the dialysis center told him his hgb is low (Tuesday of last week )and he needs to see his doctor for possible transfusion. He was unable to tell what his hgb is. Stated he has been fatigue/ no energy x 5 days.He stated the dialysis doctor told him to pick up Vit D but he has not done so. Offered an appt this afternoon; stated he has to call transportation; he can come Wed or Fri this week.  ?Appt schedule Wed 5/17 @ 0945 AM with Dr Jeanice Lim. ?

## 2022-02-12 ENCOUNTER — Other Ambulatory Visit: Payer: Self-pay

## 2022-02-13 ENCOUNTER — Encounter: Payer: Medicaid Other | Admitting: Internal Medicine

## 2022-02-15 ENCOUNTER — Other Ambulatory Visit (HOSPITAL_COMMUNITY): Payer: Self-pay

## 2022-02-15 ENCOUNTER — Encounter: Payer: Self-pay | Admitting: Internal Medicine

## 2022-02-15 ENCOUNTER — Other Ambulatory Visit: Payer: Self-pay

## 2022-02-15 ENCOUNTER — Ambulatory Visit (INDEPENDENT_AMBULATORY_CARE_PROVIDER_SITE_OTHER): Payer: Self-pay | Admitting: Internal Medicine

## 2022-02-15 DIAGNOSIS — I129 Hypertensive chronic kidney disease with stage 1 through stage 4 chronic kidney disease, or unspecified chronic kidney disease: Secondary | ICD-10-CM

## 2022-02-15 DIAGNOSIS — N185 Chronic kidney disease, stage 5: Secondary | ICD-10-CM

## 2022-02-15 DIAGNOSIS — F419 Anxiety disorder, unspecified: Secondary | ICD-10-CM

## 2022-02-15 DIAGNOSIS — F4323 Adjustment disorder with mixed anxiety and depressed mood: Secondary | ICD-10-CM

## 2022-02-15 DIAGNOSIS — K2970 Gastritis, unspecified, without bleeding: Secondary | ICD-10-CM

## 2022-02-15 DIAGNOSIS — G4700 Insomnia, unspecified: Secondary | ICD-10-CM

## 2022-02-15 DIAGNOSIS — I10 Essential (primary) hypertension: Secondary | ICD-10-CM

## 2022-02-15 DIAGNOSIS — Z87891 Personal history of nicotine dependence: Secondary | ICD-10-CM

## 2022-02-15 DIAGNOSIS — D631 Anemia in chronic kidney disease: Secondary | ICD-10-CM

## 2022-02-15 MED ORDER — PANTOPRAZOLE SODIUM 40 MG PO TBEC
40.0000 mg | DELAYED_RELEASE_TABLET | Freq: Every day | ORAL | 0 refills | Status: DC
  Filled 2022-02-15: qty 90, 90d supply, fill #0

## 2022-02-15 MED ORDER — SERTRALINE HCL 50 MG PO TABS
50.0000 mg | ORAL_TABLET | Freq: Every day | ORAL | 0 refills | Status: DC
  Filled 2022-02-15: qty 90, 90d supply, fill #0

## 2022-02-15 MED ORDER — LABETALOL HCL 200 MG PO TABS
400.0000 mg | ORAL_TABLET | Freq: Two times a day (BID) | ORAL | 0 refills | Status: DC
  Filled 2022-02-15: qty 240, 60d supply, fill #0

## 2022-02-15 MED ORDER — SERTRALINE HCL 50 MG PO TABS
50.0000 mg | ORAL_TABLET | Freq: Every day | ORAL | 0 refills | Status: DC
  Filled 2022-02-15: qty 90, 90d supply, fill #0
  Filled 2022-02-22: qty 30, 30d supply, fill #0

## 2022-02-15 MED ORDER — LABETALOL HCL 200 MG PO TABS
400.0000 mg | ORAL_TABLET | Freq: Two times a day (BID) | ORAL | 0 refills | Status: AC
  Filled 2022-02-15: qty 240, 60d supply, fill #0
  Filled 2022-02-22: qty 120, 30d supply, fill #0

## 2022-02-15 MED ORDER — CLONIDINE HCL 0.3 MG PO TABS
0.3000 mg | ORAL_TABLET | Freq: Three times a day (TID) | ORAL | 0 refills | Status: DC
  Filled 2022-02-15: qty 270, 90d supply, fill #0

## 2022-02-15 MED ORDER — AMLODIPINE BESYLATE 10 MG PO TABS
10.0000 mg | ORAL_TABLET | Freq: Every day | ORAL | 0 refills | Status: DC
  Filled 2022-02-15: qty 90, 90d supply, fill #0

## 2022-02-15 MED ORDER — CLONIDINE HCL 0.3 MG PO TABS
0.3000 mg | ORAL_TABLET | Freq: Three times a day (TID) | ORAL | 0 refills | Status: DC
  Filled 2022-02-15: qty 270, 90d supply, fill #0
  Filled 2022-02-22: qty 90, 30d supply, fill #0

## 2022-02-15 MED ORDER — AMLODIPINE BESYLATE 10 MG PO TABS
10.0000 mg | ORAL_TABLET | Freq: Every day | ORAL | 0 refills | Status: DC
  Filled 2022-02-15: qty 90, 90d supply, fill #0
  Filled 2022-02-22: qty 30, 30d supply, fill #0

## 2022-02-15 MED ORDER — PANTOPRAZOLE SODIUM 40 MG PO TBEC
40.0000 mg | DELAYED_RELEASE_TABLET | Freq: Every day | ORAL | 0 refills | Status: DC
Start: 2022-02-15 — End: 2023-02-17
  Filled 2022-02-15: qty 90, 90d supply, fill #0
  Filled 2022-02-22: qty 30, 30d supply, fill #0

## 2022-02-15 NOTE — Progress Notes (Signed)
CC: low blood counts at HD  HPI:  [name] is a 46 y.o. male with a past medical history stated below.   Anemia of chronic kidney failure, stage 5 (HCC) Patient with hx of anemia secondary to ESRD reports one week history of fatigue, low energy, falling asleep during the day, feeling lightheaded. No falls, patient ambulated to clinic without difficulty. Patient has been attending HD sessions without missed sessions and was told he has had Hgb "7. Something". He remembers the rapid improvement in symptoms he had when he received blood while admitted in March and wonders if he can get a blood transfusion. He will be leaving [place] and moving to the [place] to be closer to family, he hopes getting a transfusion will help his symptoms while his is in this busy transition period. Nephrology has already started EPO shots and iron transfusions. They will obtain FOBT to check for slow GI bleed. No other sources of bleeding.   Unfortunately patient has [employer] and he would need to pay for lab testing in clinic. Discussed this with the patient. Discussed if the CBC showed Hgb of less than 7 we would order the blood transfusion but he would need to pay for the treatment himself. Since patient is recieiving dialysis care where they are monitoring Hgb, giving iron treatments, I recommend that he continue to get care through dialysis, they will also give a blood transfusion if it is medically indicated and they may be able to get this covered under [facility] or through whichever system HD uses. Patient is in agreement and will follow up with nephrology. Next HD session tomorrow.  Insomnia Patient reports difficulty staying asleep. Is able to fall asleep without difficulty. He awakens several times over the course of the night but is able to fall back asleep within an hour usually though not all the time. Patient denies PND and endorses ruminating thoughts. Patient also endorsing orthopnea, needs to  sleep with head of the bed elevated, sleeps better this way. He has tried benadryl, melatonin, trazodone, and none of these work. He has been told he may have sleep apnea, was scheduled for sleep study though this was never completed.  On assessment, suspect insomnia may be multifactorial though wonder if orthopnea is contributing significantly to this since he reports better sleep when he sleeps in a chair. Ultimately I don't think trying another medication for sleep will be helpful, we will need to treat the underlying problem which is pulmonary edema which is being treated with HD.     Adjustment disorder with mixed anxiety and depressed mood Patient presents with persistent symptoms of anxiety and depressed mood secondary to medical condition. He was started on Zoloft 25mg  daily though does not feel this has been helpful. Patient endorses no side effects to Zoloft and is interested in increasing the dose today.  Plan: -Increase Zoloft to 50mg  daily  Essential hypertension Patient has hx of resistent HTN now ESRD on HD TTSa. Also on amlodipine 10mg  daily, clonidine 0.3mg  TID, and labetalol 400mg  BID. BP today 140/74. Patient's BP's today fairly well controlled, suspect will improve after HD session tomorrow.  Plan: -Continue adherence to HD schedule -Continue amlodipine, clonidine, labetalol    Past Medical History:  Diagnosis Date   Abnormal EKG 10/10/2021   Acute renal failure superimposed on stage 4 chronic kidney disease ([place]) 10/10/2021   Alcohol use    Alcohol use disorder in remission 11/14/2021   Elevated troponin 10/10/2021  Epigastric pain 10/10/2021   H/O alcohol abuse 10/10/2021   History of migraine 11/14/2021   Hypertension    Hypertensive emergency due to non compliance 10/10/2021   Hypokalemia 10/10/2021   Left arm numbness and pain 10/10/2021   Marijuana use 10/10/2021   Noncompliance    NSAID long-term use 10/10/2021   PRES (posterior reversible encephalopathy syndrome)     Smoker 10/10/2021   Smoking    Syncope, vasovagal 10/10/2021   Thrombocytopenia ([place]) 10/10/2021   Thrombocytopenia ([place]) 10/10/2021   Vision disturbance 11/14/2021   Vomiting 10/10/2021    Current Outpatient Medications on File Prior to Visit  Medication Sig Dispense Refill   acetaminophen (TYLENOL) 325 MG tablet Take 2 tablets (650 mg total) by mouth every 6 (six) hours as needed for fever or mild pain. 30 tablet 0   calcium acetate (PHOSLO) 667 MG capsule Take 1 capsule (667 mg total) by mouth 3 (three) times daily with meals. 90 capsule 0   Darbepoetin Alfa (ARANESP) 300 MCG/0.6ML SOSY injection Inject 0.6 mLs (300 mcg total) into the vein every dialysis (Tu,Th,Sat). 1.68 mL 0   sodium chloride (OCEAN) 0.65 % SOLN nasal spray Place 1 spray into both nostrils as needed for congestion. (Patient taking differently: Place 1 spray into both nostrils daily as needed for congestion.) 15 mL 0   Vitamin D, Ergocalciferol, (DRISDOL) 1.25 MG (50000 UNIT) CAPS capsule Take 1 capsule (50,000 Units total) by mouth once a week. 12 capsule 0   No current facility-administered medications on file prior to visit.    Family History  Problem Relation Age of Onset   Hypertension Mother    Cervical cancer Mother    Breast cancer Sister    Sudden Cardiac Death Brother 49   Heart disease Maternal Grandmother    Cancer Maternal Grandmother    Review of Systems: ROS negative except for what is noted on the assessment and plan.  Vitals:   02/15/22 0945  BP: 140/74  Pulse: 70  Resp: (!) 24  Temp: 98 F (36.7 C)  TempSrc: Oral  SpO2: 100%  Weight: 163 lb 6.4 oz (74.1 kg)  Height: 5\' 9"  (1.753 m)     Physical Exam: General: Well appearing normal weight african [place] male, NAD HENT: normocephalic, atraumatic, external ears and nares appear unremarkable EYES: conjunctiva non-erythematous, no scleral icterus CV: regular rate, normal rhythm, no murmurs, rubs, gallops. Trace LEE Pulmonary: normal  work of breathing on RA, trace crackles bilaterally Abdominal: non-distended, soft, non-tender to palpation, normal BS Skin: Warm and dry, no rashes or lesions on exposed surfaces Neurological: MS: awake, alert and oriented x3, normal speech and fund of knowledge Motor: moves all extremities antigravity Psych: normal affect     See Encounters Tab for problem based charting.  Patient discussed with Dr. Dyanne Carrel, M.D. [facility], PGY-1 Pager: [phone] Date 02/15/2022 Time 3:54 PM

## 2022-02-15 NOTE — Assessment & Plan Note (Signed)
Patient presents with persistent symptoms of anxiety and depressed mood secondary to medical condition. He was started on Zoloft 25mg  daily though does not feel this has been helpful. Patient endorses no side effects to Zoloft and is interested in increasing the dose today.  Plan: -Increase Zoloft to 50mg  daily

## 2022-02-15 NOTE — Assessment & Plan Note (Signed)
Patient reports difficulty staying asleep. Is able to fall asleep without difficulty. He awakens several times over the course of the night but is able to fall back asleep within an hour usually though not all the time. Patient denies PND and endorses ruminating thoughts. Patient also endorsing orthopnea, needs to sleep with head of the bed elevated, sleeps better this way. He has tried benadryl, melatonin, trazodone, and none of these work. He has been told he may have sleep apnea, was scheduled for sleep study though this was never completed.  On assessment, suspect insomnia may be multifactorial though wonder if orthopnea is contributing significantly to this since he reports better sleep when he sleeps in a chair. Ultimately I don't think trying another medication for sleep will be helpful, we will need to treat the underlying problem which is pulmonary edema which is being treated with HD.

## 2022-02-15 NOTE — Assessment & Plan Note (Signed)
Patient has hx of resistent HTN now ESRD on HD TTSa. Also on amlodipine 10mg  daily, clonidine 0.3mg  TID, and labetalol 400mg  BID. BP today 140/74. Patient's BP's today fairly well controlled, suspect will improve after HD session tomorrow.  Plan: -Continue adherence to HD schedule -Continue amlodipine, clonidine, labetalol

## 2022-02-15 NOTE — Assessment & Plan Note (Addendum)
Patient with hx of anemia secondary to ESRD reports one week history of fatigue, low energy, falling asleep during the day, feeling lightheaded. No falls, patient ambulated to clinic without difficulty. Patient has been attending HD sessions without missed sessions and was told he has had Hgb "7. Something". He remembers the rapid improvement in symptoms he had when he received blood while admitted in March and wonders if he can get a blood transfusion. He will be leaving [place] and moving to the [place] to be closer to family, he hopes getting a transfusion will help his symptoms while his is in this busy transition period. Nephrology has already started EPO shots and iron transfusions. They will obtain FOBT to check for slow GI bleed. No other sources of bleeding.   Unfortunately patient has [employer] and he would need to pay for lab testing in clinic. Discussed this with the patient. Discussed if the CBC showed Hgb of less than 7 we would order the blood transfusion but he would need to pay for the treatment himself. Since patient is recieiving dialysis care where they are monitoring Hgb, giving iron treatments, I recommend that he continue to get care through dialysis, they will also give a blood transfusion if it is medically indicated and they may be able to get this covered under [facility] or through whichever system HD uses. Patient is in agreement and will follow up with nephrology. Next HD session tomorrow.

## 2022-02-19 NOTE — Progress Notes (Signed)
Internal Medicine Clinic Attending ? ?Case discussed with Dr. Zinoviev  At the time of the visit.  We reviewed the resident?s history and exam and pertinent patient test results.  I agree with the assessment, diagnosis, and plan of care documented in the resident?s note.  ?

## 2022-02-20 ENCOUNTER — Telehealth: Payer: Self-pay | Admitting: Behavioral Health

## 2022-02-20 ENCOUNTER — Ambulatory Visit: Payer: Medicaid Other | Admitting: Behavioral Health

## 2022-02-20 NOTE — Telephone Encounter (Signed)
Successful contact w/Pt today. Pt sts he is in the middle of an important task & would like to r/s appt. Agreed w/Pt re-scheduling.  Dr. Theodis Shove

## 2022-02-22 ENCOUNTER — Other Ambulatory Visit (HOSPITAL_COMMUNITY): Payer: Self-pay

## 2022-03-13 ENCOUNTER — Ambulatory Visit: Payer: Medicaid Other | Admitting: Behavioral Health

## 2022-03-13 DIAGNOSIS — F419 Anxiety disorder, unspecified: Secondary | ICD-10-CM

## 2022-03-13 DIAGNOSIS — F331 Major depressive disorder, recurrent, moderate: Secondary | ICD-10-CM

## 2022-03-13 NOTE — BH Specialist Note (Signed)
Integrated Behavioral Health via Telemedicine Visit  03/13/2022 Roy Randolph [id number]  Number of Integrated Behavioral Health Clinician visits: 2 Session Start time: 0930 Session End time: 1000 Total time in minutes: 30 min  Referring Provider: Dr. Jake Shark, DO Patient/Family location: Pt is visiting in [place] to support his 45yo Son since the death of his Mother.  [facility] Provider location: [facility] Office All persons participating in visit: Pt & Clinician Types of Service: Individual psychotherapy  I connected with [place] and/or [place]  self  via  Telephone or Video Enabled Telemedicine Application  (Video is Caregility application) and verified that I am speaking with the correct person using two identifiers. Discussed confidentiality: Yes   I discussed the limitations of telemedicine and the availability of in person appointments.  Discussed there is a possibility of technology failure and discussed alternative modes of communication if that failure occurs.  I discussed that engaging in this telemedicine visit, they consent to the provision of behavioral healthcare and the services will be billed under their insurance.  Patient and/or legal guardian expressed understanding and consented to Telemedicine visit: Yes   Presenting Concerns: Patient and/or family reports the following symptoms/concerns: Pt is dealing with a procedure to remove his catheter today. He has a port in his arm for dialysis & no longer needs the catheter. He has arranged transportation from his Mother's home in [place] where he is staying. Duration of problem: years of dealing w/T2DM; Severity of problem: moderate  Patient and/or Family's Strengths/Protective Factors: Social connections, Social and Emotional competence, Concrete supports in place (healthy food, safe environments, etc.), and Sense of purpose  Goals Addressed: Patient will:  Reduce symptoms of: anxiety, depression, and  stress   Increase knowledge and/or ability of: coping skills, healthy habits, and stress reduction   Demonstrate ability to: Increase healthy adjustment to current life circumstances  Progress towards Goals: Ongoing  Interventions: Interventions utilized:  Solution-Focused Strategies and Supportive Counseling Standardized Assessments completed:  screeners prn  Patient and/or Family Response: Pt is receptive to call today  Assessment: Patient currently experiencing elevated anx due to procedure today & his complex mgmt of T2DM.   Patient may benefit from cont'd Cslg with New Provider @ [facility].  Plan: Follow up with behavioral health clinician on : TBD with New Cslr Behavioral recommendations: Care for self & Son through this difficult time Referral(s): [place] (In Clinic)  I discussed the assessment and treatment plan with the patient and/or parent/guardian. They were provided an opportunity to ask questions and all were answered. They agreed with the plan and demonstrated an understanding of the instructions.   They were advised to call back or seek an in-person evaluation if the symptoms worsen or if the condition fails to improve as anticipated.  Donnetta Hutching, LMFT

## 2022-09-05 NOTE — Progress Notes (Signed)
Cardiology Office Note:    Date:  09/09/2022   ID:  Roy Randolph, DOB 08-05-1976, MRN [id number]  PCP:  Christiana Fuchs, DO   [place] Prov[facility] Cardiologist:  Lenna Sciara, MD Referring MD: Christiana Fuchs, DO   Chief Complaint/Reason for Referral:  Chest pain, HTN  PATIENT DID NOT APPEAR FOR APPOINTMENT   ASSESSMENT:    1. Diastolic dysfunction   2. End stage kidney disease ([place])   3. Essential hypertension      PLAN:    In order of problems listed above: 1.  Diastolic dysfunction: 2.  End-stage renal disease: This is being followed by other providers. 3.  Hypertension:               Dispo:  No follow-ups on file.      Medication Adjustments/Labs and Tests Ordered: Current medicines are reviewed at length with the patient today.  Concerns regarding medicines are outlined above.  The following changes have been made:  no change   Labs/tests ordered: No orders of the defined types were placed in this encounter.   Medication Changes: No orders of the defined types were placed in this encounter.    Current medicines are reviewed at length with the patient today.  The patient does not have concerns regarding medicines.   History of Present Illness:    FOCUSED PROBLEM LIST:   1.  End-stage renal disease on hemodialysis ([name]) 2.  Hypertension 3.  Anemia of chronic disease 4.  Ongoing tobacco abuse 5.  Prior alcohol abuse   May 2023 consultation: The patient is a 46 y.o. male with the indicated medical history here for emergency room follow-up.  The patient was seen recently in the emergency department due to chest pain.  At that time her blood pressure was 145/94.  Her cardiac biomarkers were mildly abnormal.  An EKG left ventricular hypertrophy with repolarization abnormalities.  A chest x-ray demonstrated mild cardiomegaly and mild central vascular congestion.  Her other laboratories were remarkable for creatinine of 7.31, hemoglobin  of 9, and normal glucose level.  She was administered aspirin, as needed nitroglycerin, and clonidine.  When questioned about this episode further apparently this happened on Sunday night into Monday.  This was during his birthday weekend when the patient ingested more fluid than he normally does.  Once he started dialysis on Tuesday following his presentation emergency department he has no longer had any chest discomfort.  Apparently the chest discomfort came on at rest and was associated with shortness of breath.  He has had no exertional angina, exertional dyspnea, or any problems with completing dialysis due to angina.  Plan: Continue medical therapy as chest pain was likely due to high LVEDP.  Today:   Current Medications: No outpatient medications have been marked as taking for the 09/09/22 encounter (Office Visit) with Early Osmond, MD.     Allergies:    Cucumber extract, Other, Tomato (diagnostic), Wild lettuce [wild lettuce extract (lactuca virosa)], and Hydralazine   Social History:   Social History   Tobacco Use   Smoking status: Former    Packs/day: 0.30    Types: Cigarettes    Start date: 1998   Tobacco comments:    Stopped in January  Substance Use Topics   Alcohol use: Not Currently    Alcohol/week: 12.0 standard drinks of alcohol    Types: 12 Cans of beer per week    Comment: stopped heavy drinking 2021   Drug use: Yes  Types: Marijuana     Family Hx: Family History  Problem Relation Age of Onset   Hypertension Mother    Cervical cancer Mother    Breast cancer Sister    Sudden Cardiac Death Brother 42   Heart disease Maternal Grandmother    Cancer Maternal Grandmother      Review of Systems:   Please see the history of present illness.    All other systems reviewed and are negative.     EKGs/Labs/Other Test Reviewed:    EKG:  EKG performed January 01, 2022 that I personally reviewed demonstrates sinus rhythm with left ventricular hypertrophy and  repolarization abnormalities   Prior CV studies:   TTE 2023 demonstrates an ejection fraction of 55% with mild left ventricular hypertrophy, grade 2 diastolic dysfunction, no significant valvular abnormalities.   Other studies Reviewed: Review of the additional studies/records demonstrates: MR abdomen 2023 without aortic atherosclerosis or aneurysm  Recent Labs: 10/10/2021: TSH 1.002 12/04/2021: Magnesium 2.0 12/24/2021: ALT 13 01/30/2022: BUN 37; Creatinine, Ser 7.31; Hemoglobin 9.0; Platelets 202; Potassium 3.2; Sodium 133   Recent Lipid Panel Lab Results  Component Value Date/Time   TRIG 370 (H) 12/02/2021 03:05 AM    Risk Assessment/Calculations:           Physical Exam:     Signed, Early Osmond, MD  09/09/2022 3:05 PM    [facility] [address], [place], [place]  [id number] Phone: [phone]; Fax: [phone]   Note:  This document was prepared using Dragon voice recognition software and may include unintentional dictation errors.

## 2022-09-09 ENCOUNTER — Ambulatory Visit: Payer: Medicaid Other | Attending: Internal Medicine | Admitting: Internal Medicine

## 2022-09-09 ENCOUNTER — Encounter: Payer: Self-pay | Admitting: Internal Medicine

## 2022-09-09 DIAGNOSIS — I10 Essential (primary) hypertension: Secondary | ICD-10-CM

## 2022-09-09 DIAGNOSIS — I5189 Other ill-defined heart diseases: Secondary | ICD-10-CM

## 2022-09-09 DIAGNOSIS — N186 End stage renal disease: Secondary | ICD-10-CM

## 2022-09-19 ENCOUNTER — Other Ambulatory Visit: Payer: Self-pay

## 2022-10-24 ENCOUNTER — Other Ambulatory Visit (HOSPITAL_COMMUNITY): Payer: Self-pay

## 2022-12-05 ENCOUNTER — Emergency Department (HOSPITAL_COMMUNITY): Payer: Medicaid Other

## 2022-12-05 ENCOUNTER — Encounter (HOSPITAL_COMMUNITY): Payer: Self-pay

## 2022-12-05 ENCOUNTER — Other Ambulatory Visit: Payer: Self-pay

## 2022-12-05 ENCOUNTER — Emergency Department (HOSPITAL_COMMUNITY)
Admission: EM | Admit: 2022-12-05 | Discharge: 2022-12-05 | Disposition: A | Discharge status: Home / Self Care | Payer: Medicaid Other | Attending: Emergency Medicine | Admitting: Emergency Medicine

## 2022-12-05 DIAGNOSIS — I132 Hypertensive heart and chronic kidney disease with heart failure and with stage 5 chronic kidney disease, or end stage renal disease: Secondary | ICD-10-CM | POA: Diagnosis not present

## 2022-12-05 DIAGNOSIS — I509 Heart failure, unspecified: Secondary | ICD-10-CM | POA: Diagnosis not present

## 2022-12-05 DIAGNOSIS — N186 End stage renal disease: Secondary | ICD-10-CM | POA: Diagnosis not present

## 2022-12-05 DIAGNOSIS — R110 Nausea: Secondary | ICD-10-CM | POA: Diagnosis present

## 2022-12-05 DIAGNOSIS — Z992 Dependence on renal dialysis: Secondary | ICD-10-CM | POA: Diagnosis not present

## 2022-12-05 DIAGNOSIS — I10 Essential (primary) hypertension: Secondary | ICD-10-CM

## 2022-12-05 DIAGNOSIS — I861 Scrotal varices: Secondary | ICD-10-CM | POA: Diagnosis not present

## 2022-12-05 DIAGNOSIS — K4090 Unilateral inguinal hernia, without obstruction or gangrene, not specified as recurrent: Secondary | ICD-10-CM | POA: Diagnosis not present

## 2022-12-05 DIAGNOSIS — Z79899 Other long term (current) drug therapy: Secondary | ICD-10-CM | POA: Insufficient documentation

## 2022-12-05 LAB — URINALYSIS, ROUTINE W REFLEX MICROSCOPIC
Bilirubin Urine: NEGATIVE
Glucose, UA: NEGATIVE mg/dL
Ketones, ur: NEGATIVE mg/dL
Leukocytes,Ua: NEGATIVE
Nitrite: NEGATIVE
Protein, ur: 100 mg/dL — AB
Specific Gravity, Urine: 1.015 (ref 1.005–1.030)
pH: 5 (ref 5.0–8.0)

## 2022-12-05 LAB — CBC WITH DIFFERENTIAL/PLATELET
Abs Immature Granulocytes: 0.04 10*3/uL (ref 0.00–0.07)
Basophils Absolute: 0 10*3/uL (ref 0.0–0.1)
Basophils Relative: 1 %
Eosinophils Absolute: 0 10*3/uL (ref 0.0–0.5)
Eosinophils Relative: 1 %
HCT: 32.5 % — ABNORMAL LOW (ref 39.0–52.0)
Hemoglobin: 10.7 g/dL — ABNORMAL LOW (ref 13.0–17.0)
Immature Granulocytes: 1 %
Lymphocytes Relative: 15 %
Lymphs Abs: 0.8 10*3/uL (ref 0.7–4.0)
MCH: 27.9 pg (ref 26.0–34.0)
MCHC: 32.9 g/dL (ref 30.0–36.0)
MCV: 84.9 fL (ref 80.0–100.0)
Monocytes Absolute: 0.7 10*3/uL (ref 0.1–1.0)
Monocytes Relative: 13 %
Neutro Abs: 3.8 10*3/uL (ref 1.7–7.7)
Neutrophils Relative %: 69 %
Platelets: 222 10*3/uL (ref 150–400)
RBC: 3.83 MIL/uL — ABNORMAL LOW (ref 4.22–5.81)
RDW: 19 % — ABNORMAL HIGH (ref 11.5–15.5)
WBC: 5.4 10*3/uL (ref 4.0–10.5)
nRBC: 0 % (ref 0.0–0.2)

## 2022-12-05 LAB — COMPREHENSIVE METABOLIC PANEL
ALT: 13 U/L (ref 0–44)
AST: 23 U/L (ref 15–41)
Albumin: 2.7 g/dL — ABNORMAL LOW (ref 3.5–5.0)
Alkaline Phosphatase: 138 U/L — ABNORMAL HIGH (ref 38–126)
Anion gap: 10 (ref 5–15)
BUN: 49 mg/dL — ABNORMAL HIGH (ref 6–20)
CO2: 26 mmol/L (ref 22–32)
Calcium: 9.2 mg/dL (ref 8.9–10.3)
Chloride: 98 mmol/L (ref 98–111)
Creatinine, Ser: 6.48 mg/dL — ABNORMAL HIGH (ref 0.61–1.24)
GFR, Estimated: 10 mL/min — ABNORMAL LOW (ref >60)
Glucose, Bld: 141 mg/dL — ABNORMAL HIGH (ref 70–99)
Potassium: 3.8 mmol/L (ref 3.5–5.1)
Sodium: 134 mmol/L — ABNORMAL LOW (ref 135–145)
Total Bilirubin: 1.4 mg/dL — ABNORMAL HIGH (ref 0.3–1.2)
Total Protein: 7.5 g/dL (ref 6.5–8.1)

## 2022-12-05 LAB — TROPONIN I (HIGH SENSITIVITY)
Troponin I (High Sensitivity): 48 ng/L — ABNORMAL HIGH (ref <18)
Troponin I (High Sensitivity): 50 ng/L — ABNORMAL HIGH (ref <18)

## 2022-12-05 LAB — LIPASE, BLOOD: Lipase: 30 U/L (ref 11–51)

## 2022-12-05 MED ORDER — LABETALOL HCL 5 MG/ML IV SOLN
20.0000 mg | Freq: Once | INTRAVENOUS | Status: AC
  Administered 2022-12-05: 20 mg via INTRAVENOUS
  Filled 2022-12-05: qty 4

## 2022-12-05 MED ORDER — ONDANSETRON HCL 4 MG/2ML IJ SOLN
4.0000 mg | Freq: Once | INTRAMUSCULAR | Status: AC
  Administered 2022-12-05: 4 mg via INTRAVENOUS
  Filled 2022-12-05: qty 2

## 2022-12-05 MED ORDER — ONDANSETRON 4 MG PO TBDP: 4.0000 mg | ORAL_TABLET | Freq: Three times a day (TID) | ORAL | 0 refills | Status: DC | PRN

## 2022-12-05 MED ORDER — CLONIDINE HCL 0.2 MG PO TABS
0.3000 mg | ORAL_TABLET | Freq: Once | ORAL | Status: AC
  Administered 2022-12-05: 0.3 mg via ORAL
  Filled 2022-12-05: qty 1

## 2022-12-05 MED ORDER — HYDROCODONE-ACETAMINOPHEN 5-325 MG PO TABS: 1.0000 | ORAL_TABLET | ORAL | 0 refills | Status: DC | PRN

## 2022-12-05 MED ORDER — CLONIDINE HCL 0.3 MG PO TABS: 0.3000 mg | ORAL_TABLET | Freq: Three times a day (TID) | ORAL | 0 refills | Status: AC

## 2022-12-05 MED ORDER — MORPHINE SULFATE (PF) 4 MG/ML IV SOLN
4.0000 mg | Freq: Once | INTRAVENOUS | Status: AC
  Administered 2022-12-05: 4 mg via INTRAVENOUS
  Filled 2022-12-05: qty 1

## 2022-12-05 NOTE — ED Triage Notes (Signed)
Patient here for evaluation of painful hernia and states that he has had "for a while" and has gotten more painful with the pain now radiating into his left testicle. Patient also complains of feeling like his right ear is "underwater", denies difficulty hearing.

## 2022-12-05 NOTE — ED Provider Notes (Signed)
[facility] Provider Note   CSN: [id number] Arrival date & time: 12/05/22  1500     History  Chief Complaint  Patient presents with   Hernia    Roy Randolph is a 47 y.o. male.  Pt is a 47 yo male with pmhx significant for ESRD on HD (Tu, Th, Sat), HTN, and CHF.  Pt has had an inguinal hernia for several years.  It has been hurting more for the past month.  He also feels nauseous and so did not take his bp meds or go to dialysis today.  Pt did go to dialysis on 3/5.  Pt has not seen a surgeon for his hernia.       Home Medications Prior to Admission medications   Medication Sig Start Date End Date Taking? Authorizing Provider  HYDROcodone-acetaminophen (NORCO/VICODIN) 5-325 MG tablet Take 1 tablet by mouth every 4 (four) hours as needed. 12/05/22  Yes Isla Pence, MD  ondansetron (ZOFRAN-ODT) 4 MG disintegrating tablet Take 1 tablet (4 mg total) by mouth every 8 (eight) hours as needed for nausea or vomiting. 12/05/22  Yes Isla Pence, MD  acetaminophen (TYLENOL) 325 MG tablet Take 2 tablets (650 mg total) by mouth every 6 (six) hours as needed for fever or mild pain. 01/02/22   Katsadouros, Vasilios, MD  amLODipine (NORVASC) 10 MG tablet Take 1 tablet (10 mg total) by mouth daily. 02/15/22   Wayland Denis, MD  calcium acetate (PHOSLO) 667 MG capsule Take 1 capsule (667 mg total) by mouth 3 (three) times daily with meals. 01/18/22   Marianna Payment, MD  cloNIDine (CATAPRES) 0.3 MG tablet Take 1 tablet (0.3 mg total) by mouth 3 (three) times daily. 12/05/22   Isla Pence, MD  Darbepoetin Alfa (ARANESP) 300 MCG/0.6ML SOSY injection Inject 0.6 mLs (300 mcg total) into the vein every dialysis (Tu,Th,Sat). 01/02/22   Katsadouros, Vasilios, MD  labetalol (NORMODYNE) 200 MG tablet Take 2 tablets (400 mg total) by mouth 2 (two) times daily. 02/15/22   Wayland Denis, MD  pantoprazole (PROTONIX) 40 MG tablet Take 1 tablet (40 mg total) by mouth  daily. 02/15/22   Wayland Denis, MD  sertraline (ZOLOFT) 50 MG tablet Take 1 tablet (50 mg total) by mouth daily. 02/15/22   Wayland Denis, MD  sodium chloride (OCEAN) 0.65 % SOLN nasal spray Place 1 spray into both nostrils as needed for congestion. Patient taking differently: Place 1 spray into both nostrils daily as needed for congestion. 12/20/21   Masters, Katie, DO  Vitamin D, Ergocalciferol, (DRISDOL) 1.25 MG (50000 UNIT) CAPS capsule Take 1 capsule (50,000 Units total) by mouth once a week. 02/07/22   Penninger, Ria Comment, PA      Allergies    Cucumber extract, Other, Tomato (diagnostic), Wild lettuce [wild lettuce extract (lactuca virosa)], and Hydralazine    Review of Systems   Review of Systems  Gastrointestinal:  Positive for abdominal pain and nausea.  All other systems reviewed and are negative.   Physical Exam Updated Vital Signs BP (!) 222/138   Pulse 80   Temp 97.6 F (36.4 C)   Resp 16   SpO2 100%  Physical Exam Vitals and nursing note reviewed. Exam conducted with a chaperone present.  Constitutional:      Appearance: Normal appearance.  HENT:     Head: Normocephalic and atraumatic.     Right Ear: External ear normal.     Left Ear: External ear normal.     Nose:  Nose normal.     Mouth/Throat:     Mouth: Mucous membranes are dry.  Eyes:     Extraocular Movements: Extraocular movements intact.     Conjunctiva/sclera: Conjunctivae normal.     Pupils: Pupils are equal, round, and reactive to light.  Cardiovascular:     Rate and Rhythm: Normal rate and regular rhythm.     Pulses: Normal pulses.     Heart sounds: Normal heart sounds.  Pulmonary:     Effort: Pulmonary effort is normal.     Breath sounds: Normal breath sounds.  Abdominal:     General: Abdomen is flat. Bowel sounds are normal.     Palpations: Abdomen is soft.     Hernia: There is no hernia in the left inguinal area.  Genitourinary:    Penis: Circumcised.      Testes:        Left: Tenderness  present.     Comments: I don't feel a hernia, but pt is tender in the inguinal canal and he said he feels something come out when he stands up. Musculoskeletal:        General: Normal range of motion.     Cervical back: Normal range of motion and neck supple.     Comments: + AVF left arm  Skin:    General: Skin is warm.     Capillary Refill: Capillary refill takes less than 2 seconds.  Neurological:     General: No focal deficit present.     Mental Status: He is alert and oriented to person, place, and time.  Psychiatric:        Mood and Affect: Mood normal.        Behavior: Behavior normal.     ED Results / Procedures / Treatments   Labs (all labs ordered are listed, but only abnormal results are displayed) Labs Reviewed  CBC WITH DIFFERENTIAL/PLATELET - Abnormal; Notable for the following components:      Result Value   RBC 3.83 (*)    Hemoglobin 10.7 (*)    HCT 32.5 (*)    RDW 19.0 (*)    All other components within normal limits  COMPREHENSIVE METABOLIC PANEL - Abnormal; Notable for the following components:   Sodium 134 (*)    Glucose, Bld 141 (*)    BUN 49 (*)    Creatinine, Ser 6.48 (*)    Albumin 2.7 (*)    Alkaline Phosphatase 138 (*)    Total Bilirubin 1.4 (*)    GFR, Estimated 10 (*)    All other components within normal limits  URINALYSIS, ROUTINE W REFLEX MICROSCOPIC - Abnormal; Notable for the following components:   Color, Urine AMBER (*)    APPearance HAZY (*)    Hgb urine dipstick SMALL (*)    Protein, ur 100 (*)    Bacteria, UA RARE (*)    All other components within normal limits  TROPONIN I (HIGH SENSITIVITY) - Abnormal; Notable for the following components:   Troponin I (High Sensitivity) 48 (*)    All other components within normal limits  LIPASE, BLOOD  TROPONIN I (HIGH SENSITIVITY)    EKG EKG Interpretation  Date/Time:  Thursday December 05 2022 16:15:32 EST Ventricular Rate:  81 PR Interval:  138 QRS Duration: 99 QT Interval:  411 QTC  Calculation: 478 R Axis:   67 Text Interpretation: Sinus rhythm Probable left atrial enlargement Left ventricular hypertrophy Borderline prolonged QT interval No significant change since last tracing Confirmed by Isla Pence (  [id number]) on 12/05/2022 4:22:53 PM  Radiology US SCROTUM W/DOPPLER  Result Date: 12/05/2022 CLINICAL DATA:  Testicular pain EXAM: SCROTAL ULTRASOUND DOPPLER ULTRASOUND OF THE TESTICLES TECHNIQUE: Complete ultrasound examination of the testicles, epididymis, and other scrotal structures was performed. Color and spectral Doppler ultrasound were also utilized to evaluate blood flow to the testicles. COMPARISON:  None Available. FINDINGS: Right testicle Measurements: 4.2 x 2.0 x 3.6. Microlithiasis identified. Is also a peripheral small cysts in the testicle measuring 4 mm, possible a tunica albuginea cyst. Left testicle Measurements:  3.8 x 1.9 x 2.7.  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 Right epididymis: Right-sided epididymal cyst or spermatocele measuring 14 mm. Left epididymis:  Normal in size and appearance. Hydrocele:  Physiologic scrotal fluid. Varicocele:  Left-sided varicocele. Pulsed Doppler interrogation of both testes demonstrates normal low resistance arterial and venous waveforms bilaterally. IMPRESSION: Testicular microlithiasis. Small benign right-sided testicular cyst and separate right-sided epididymal cyst or spermatocele. Left-sided varicocele. Preserved blood flow to the testicles. Electronically Signed   By: Jill Side M.D.   On: 12/05/2022 17:20   CT ABDOMEN PELVIS WO CONTRAST  Result Date: 12/05/2022 CLINICAL DATA:  Abdominal distention with concern for bowel obstruction EXAM: CT ABDOMEN AND PELVIS WITHOUT CONTRAST TECHNIQUE: Multidetector CT imaging of the abdomen and pelvis was performed following the standard protocol without IV contrast. RADIATION DOSE REDUCTION: This exam was performed according to the departmental dose-optimization program which includes  automated exposure control, adjustment of the mA and/or kV according to patient size and/or use of iterative reconstruction technique. COMPARISON:  None Available. FINDINGS: Lower chest: No focal consolidation or pulmonary nodule in the lung bases. No pleural effusion or pneumothorax demonstrated. Multichamber cardiomegaly. Hepatobiliary: No focal hepatic lesions. No intra or extrahepatic biliary ductal dilation. Normal gallbladder. Pancreas: No focal lesions or main ductal dilation. Spleen: Normal in size without focal abnormality. Adrenals/Urinary Tract: No adrenal nodules. No suspicious renal mass, calculi or hydronephrosis. 0.3 cm hypoattenuating focus within the lower pole left kidney (4:34), likely cyst. Subcentimeter right lower pole hypodensity (4:35), too small to characterize but also likely cysts. No focal bladder wall thickening. Stomach/Bowel: Normal appearance of the stomach. No evidence of bowel wall thickening, distention, or inflammatory changes. Normal appendix. Vascular/Lymphatic: Aortic atherosclerosis. Left para-aortic lymph nodes measure up to 1.1 cm (4:31). Reproductive: Prostate is unremarkable. Other: Small to moderate volume ascites. Musculoskeletal: No acute or abnormal lytic or blastic osseous lesions. Small fat-containing paraumbilical hernia. IMPRESSION: 1. Small to moderate volume ascites.  No abnormal bowel dilation. 2. Small fat-containing paraumbilical hernia. 3. Left para-aortic lymph nodes measure up to 1.1 cm, likely reactive. 4. Multichamber cardiomegaly. 5. Aortic Atherosclerosis (ICD10-I70.0). Electronically Signed   By: Darrin Nipper M.D.   On: 12/05/2022 16:53   DG Chest Portable 1 View  Result Date: 12/05/2022 CLINICAL DATA:  Shortness of breath EXAM: PORTABLE CHEST 1 VIEW COMPARISON:  Chest radiograph 01/30/2022 FINDINGS: The right-sided vascular catheter has been removed. The heart is enlarged, unchanged. The upper mediastinal contours are normal. There is no focal  consolidation. There is vascular congestion without definite overt pulmonary edema. There is blunting of the right costophrenic angle which is unchanged which may reflect scarring or trace pleural effusion. There is no significant left effusion. There is no pneumothorax There is no acute osseous abnormality. IMPRESSION: 1. Cardiomegaly with vascular congestion but no definite overt pulmonary edema. 2. Blunting of the right costophrenic angle is unchanged since the prior study from 2023 and may reflect scarring or a trace pleural effusion. Electronically Signed  By: Valetta Mole M.D.   On: 12/05/2022 16:15    Procedures Procedures    Medications Ordered in ED Medications  morphine (PF) 4 MG/ML injection 4 mg (4 mg Intravenous Given 12/05/22 1604)  ondansetron (ZOFRAN) injection 4 mg (4 mg Intravenous Given 12/05/22 1604)  labetalol (NORMODYNE) injection 20 mg (20 mg Intravenous Given 12/05/22 1604)  cloNIDine (CATAPRES) tablet 0.3 mg (0.3 mg Oral Given 12/05/22 1715)  morphine (PF) 4 MG/ML injection 4 mg (4 mg Intravenous Given 12/05/22 1725)  ondansetron (ZOFRAN) injection 4 mg (4 mg Intravenous Given 12/05/22 1739)    ED Course/ Medical Decision Making/ A&P                             Medical Decision Making Amount and/or Complexity of Data Reviewed Radiology: ordered.  Risk Prescription drug management.   This patient presents to the ED for concern of hernia pain, this involves an extensive number of treatment options, and is a complaint that carries with it a high risk of complications and morbidity.  The differential diagnosis includes incarcerated hernia   Co morbidities that complicate the patient evaluation  ESRD on HD (Tu, Th, Sat), HTN, and CHF   Additional history obtained:  Additional history obtained from epic chart review External records from outside source obtained and reviewed including family   Lab Tests:  I Ordered, and personally interpreted labs.  The pertinent  results include:  cbc with hgb 10.7 (chronic), cmp with bun 49 and cr 6.48 (chronic); urine with small hgb and + protein; trop 48 (stable), lip 30   Imaging Studies ordered:  I ordered imaging studies including testicular US and CT abd/pelvis  I independently visualized and interpreted imaging which showed  CT abd/pelvis: 1. Small to moderate volume ascites.  No abnormal bowel dilation.  2. Small fat-containing paraumbilical hernia.  3. Left para-aortic lymph nodes measure up to 1.1 cm, likely  reactive.  4. Multichamber cardiomegaly.  5. Aortic Atherosclerosis (ICD10-I70.0).  US scrotum: Testicular microlithiasis.    Small benign right-sided testicular cyst and separate right-sided  epididymal cyst or spermatocele.    Left-sided varicocele.    Preserved blood flow to the testicles.   CXR: . Cardiomegaly with vascular congestion but no definite overt  pulmonary edema.  2. Blunting of the right costophrenic angle is unchanged since the  prior study from 2023 and may reflect scarring or a trace pleural  effusion.      Electronically   I agree with the radiologist interpretation   Cardiac Monitoring:  The patient was maintained on a cardiac monitor.  I personally viewed and interpreted the cardiac monitored which showed an underlying rhythm of: nsr   Medicines ordered and prescription drug management:  I ordered medication including labetalol and clonidine  for htn and morphine/zofran for pain and nausea  Reevaluation of the patient after these medicines showed that the patient improved I have reviewed the patients home medicines and have made adjustments as needed   Test Considered:  Ct/us   Critical Interventions:  Pain control  Problem List / ED Course:  ESRD on HD:  pt skipped dialysis today.  No indication for emergent dialysis now.  Pt is to call his HD center tomorrow. Left groin pain:  no definite hernia palpated or on CT.  Pt said something comes out  when he stands, so I am going to refer to gen surg.  Pt does have a varicocele and  said he's had some intermittent hematuria.  Pt is referred to urology. HTN:  pt remains hypertensive.  He said he has not had any of his bp meds since Tuesday (3/5).  He said it takes awhile to come back down after he's not taken his meds.  He is out of his clonidine, so asks that I send in a rx for clonidine.  Pt's sister has made an appt with [facility] next week.   Reevaluation:  After the interventions noted above, I reevaluated the patient and found that they have :improved   Social Determinants of Health:  Lives at home   Dispostion:  After consideration of the diagnostic results and the patients response to treatment, I feel that the patent would benefit from discharge with outpatient f/u.          Final Clinical Impression(s) / ED Diagnoses Final diagnoses:  ESRD on hemodialysis ([facility])  Hypertension, unspecified type  Varicocele  Left inguinal hernia    Rx / DC Orders ED Discharge Orders          Ordered    cloNIDine (CATAPRES) 0.3 MG tablet  3 times daily       Note to Pharmacy: IM Program   12/05/22 1825    HYDROcodone-acetaminophen (NORCO/VICODIN) 5-325 MG tablet  Every 4 hours PRN        12/05/22 1825    ondansetron (ZOFRAN-ODT) 4 MG disintegrating tablet  Every 8 hours PRN        12/05/22 1825              Isla Pence, MD 12/05/22 1830

## 2022-12-05 NOTE — ED Provider Triage Note (Signed)
Emergency Medicine Provider Triage Evaluation Note  Roy Randolph , a 47 y.o. male  was evaluated in triage.  Pt complains of abdominal distention for the past few days.  Vomiting was using the restroom.  Hernia is hard, hard to repeat.  Also not taking blood pressure medicine secondary to vomiting.  Review of Systems  Per HPI  Physical Exam  BP (!) 231/133 (BP Location: Right Arm)   Pulse 89   Temp 97.6 F (36.4 C)   Resp 20   SpO2 100%  Gen:   Awake, no distress   Resp:  Normal effort  MSK:   Moves extremities without difficulty  Other:  Abdomen distended, umbilical hernia   Medical Decision Making  Medically screening exam initiated at 3:19 PM.  Appropriate orders placed.  Roy Randolph was informed that the remainder of the evaluation will be completed by another provider, this initial triage assessment does not replace that evaluation, and the importance of remaining in the ED until their evaluation is complete.     Sherrill Raring, PA-C 12/05/22 1521

## 2022-12-12 ENCOUNTER — Encounter: Payer: Self-pay | Admitting: Internal Medicine

## 2022-12-12 ENCOUNTER — Ambulatory Visit: Payer: Medicaid Other | Attending: Internal Medicine | Admitting: Internal Medicine

## 2022-12-12 VITALS — BP 179/91 | HR 72 | Temp 98.3°F | Ht 69.0 in | Wt 163.0 lb

## 2022-12-12 DIAGNOSIS — H6121 Impacted cerumen, right ear: Secondary | ICD-10-CM

## 2022-12-12 DIAGNOSIS — R0683 Snoring: Secondary | ICD-10-CM

## 2022-12-12 DIAGNOSIS — I861 Scrotal varices: Secondary | ICD-10-CM

## 2022-12-12 DIAGNOSIS — F321 Major depressive disorder, single episode, moderate: Secondary | ICD-10-CM | POA: Diagnosis not present

## 2022-12-12 DIAGNOSIS — N442 Benign cyst of testis: Secondary | ICD-10-CM | POA: Diagnosis not present

## 2022-12-12 DIAGNOSIS — G478 Other sleep disorders: Secondary | ICD-10-CM

## 2022-12-12 DIAGNOSIS — N186 End stage renal disease: Secondary | ICD-10-CM | POA: Diagnosis not present

## 2022-12-12 DIAGNOSIS — Z992 Dependence on renal dialysis: Secondary | ICD-10-CM

## 2022-12-12 DIAGNOSIS — I130 Hypertensive heart and chronic kidney disease with heart failure and stage 1 through stage 4 chronic kidney disease, or unspecified chronic kidney disease: Secondary | ICD-10-CM

## 2022-12-12 DIAGNOSIS — H9311 Tinnitus, right ear: Secondary | ICD-10-CM

## 2022-12-12 DIAGNOSIS — Z7689 Persons encountering health services in other specified circumstances: Secondary | ICD-10-CM

## 2022-12-12 MED ORDER — MIRTAZAPINE 7.5 MG PO TABS: 7.5000 mg | ORAL_TABLET | Freq: Every day | ORAL | 1 refills | Status: DC

## 2022-12-12 NOTE — Patient Instructions (Signed)
Start Remeron on 7.5 mg at bedtime after you have weaned off Zoloft.  I have submitted the referral for you to see the urologist, ear nose and throat specialist and nephrologist.

## 2022-12-12 NOTE — Progress Notes (Signed)
Patient ID: Roy Randolph, male    DOB: [birth year]  MRN: [id number]  CC: Establish Care (Est care / new pt. [facility]ion Cro[facility] all medications. /No to flu vax.)   Subjective: Roy Randolph is a 47 y.o. male who presents for new pt visit.  Lyda Perone, sister, is with him. His concerns today include:  Patient with history of HTN, ESRD on HD, GERD,migraines, light smoker, complex renal cyst, chronic anemia, small peri-umbilical hernia, CHF diastolic, adjustment disorder with anx/dep,  smokes marijuana several days a wk to daily.   Previous PCP was at [facility]. Was in [place] for 9 mths and just came back to [place].   He brings his bag of medications with him.  History of end-stage renal disease on HD Tue/Thur/Sat.  Sisters inquiring about him getting in with a nephrologist.  Sounds like he is also thinking about changing dialysis center. He has history of hypertension.  Sister states his blood pressure is usually significantly elevated.  Even though elevated today she states this is good for him.  Blood pressure is best right after dialysis.  SBP at that time is usually in the 130s.  Current blood pressure medications are amlodipine 10 mg daily, Bumex 2 mg twice a day, clonidine 0.3 mg 3 times a day, labetalol 400 mg twice a day, nifedipine 300 mg twice a day  Complains of a swimmy feeling in the right ear and around the right ear.  This has been going on for a while.  No decreased hearing.  Reports that he was given some drops in the past but never picked them up.  History of depression.  Currently on Zoloft 100 mg daily.  He has been on it for about a year.  He does not find it to be helpful and would like to be changed to something else.  Has problems sleeping.  Reports he has had a lot that is gone on with him over the past 1 year.  Recently seen in the emergency room for possible inguinal hernia.  However no inguinal hernia was seen.  CT of the abdomen showed small  fat-containing periumbilical hernia.  Scrotal ultrasound done revealed small benign-appearing right-sided testicular cyst and separate right-sided epididymal cysts or spermatocele.  He had left sided varicocele.  Patient requesting referral to urology and wants to know whether he would need to have surgery.  Sister is requesting a handicap sticker for him.  Reports that he gets out of breath and fatigue easily.  Would also like to have a sleep study done.  He reports loud snoring and family has observed apneic episodes.  Reports nonrestorative sleep. Patient Active Problem List   Diagnosis Date Noted   Insomnia 02/15/2022   Chest pain 01/30/2022   Complex renal cyst 01/18/2022   Anemia of chronic kidney failure, stage 5 ([place]) 12/25/2021   Xerostomia 12/21/2021   End stage kidney disease ([place]) 12/14/2021   Adjustment disorder with mixed anxiety and depressed mood 12/14/2021   Former smoker 11/14/2021   Gastritis without bleeding 11/14/2021   Essential hypertension 11/14/2021   Normocytic anemia 10/10/2021   Migraine 10/10/2021     Current Outpatient Medications on File Prior to Visit  Medication Sig Dispense Refill   acetaminophen (TYLENOL) 325 MG tablet Take 2 tablets (650 mg total) by mouth every 6 (six) hours as needed for fever or mild pain. 30 tablet 0   amLODipine (NORVASC) 10 MG tablet Take 1 tablet (10 mg total) by mouth  daily. 90 tablet 0   bumetanide (BUMEX) 2 MG tablet Take 2 mg by mouth daily. Take 1 tablet ('2MG'$ ) by mouth twice daily     calcium acetate (PHOSLO) 667 MG capsule Take 1 capsule (667 mg total) by mouth 3 (three) times daily with meals. 90 capsule 0   cloNIDine (CATAPRES) 0.3 MG tablet Take 1 tablet (0.3 mg total) by mouth 3 (three) times daily. 270 tablet 0   labetalol (NORMODYNE) 200 MG tablet Take 2 tablets (400 mg total) by mouth 2 (two) times daily. 240 tablet 0   NIFEdipine (ADALAT CC) 30 MG 24 hr tablet Take 30 mg by mouth daily. Take 1 tablet 30 MG ER (CC)  tablet  Take 1 tablet (30 MG) by mouth every 12 hours     pantoprazole (PROTONIX) 40 MG tablet Take 1 tablet (40 mg total) by mouth daily. 90 tablet 0   sertraline (ZOLOFT) 50 MG tablet Take 1 tablet (50 mg total) by mouth daily. 90 tablet 0   Darbepoetin Alfa (ARANESP) 300 MCG/0.6ML SOSY injection Inject 0.6 mLs (300 mcg total) into the vein every dialysis (Tu,Th,Sat). (Patient not taking: Reported on 12/12/2022) 1.68 mL 0   HYDROcodone-acetaminophen (NORCO/VICODIN) 5-325 MG tablet Take 1 tablet by mouth every 4 (four) hours as needed. (Patient not taking: Reported on 12/12/2022) 10 tablet 0   ondansetron (ZOFRAN-ODT) 4 MG disintegrating tablet Take 1 tablet (4 mg total) by mouth every 8 (eight) hours as needed for nausea or vomiting. (Patient not taking: Reported on 12/12/2022) 20 tablet 0   sodium chloride (OCEAN) 0.65 % SOLN nasal spray Place 1 spray into both nostrils as needed for congestion. (Patient not taking: Reported on 12/12/2022) 15 mL 0   Vitamin D, Ergocalciferol, (DRISDOL) 1.25 MG (50000 UNIT) CAPS capsule Take 1 capsule (50,000 Units total) by mouth once a week. (Patient not taking: Reported on 12/12/2022) 12 capsule 0   No current facility-administered medications on file prior to visit.    Allergies  Allergen Reactions   Cucumber Extract Anaphylaxis and Itching   Other Anaphylaxis, Itching, Swelling and Other (See Comments)    The patient CANNOT eat raw/fresh fruit or vegetables. Reaction starts with ears itching, then progresses from there. He can eat canned versions of these foods, however.   Tomato (Diagnostic) Anaphylaxis and Swelling   Wild Lettuce [Wild Lettuce Extract (Lactuca Virosa)] Anaphylaxis and Swelling   Hydralazine Hives, Itching and Other (See Comments)    SEVERE ITCHING    Social History   Socioeconomic History   Marital status: Single    Spouse name: Not on file   Number of children: Not on file   Years of education: Not on file   Highest education  level: Not on file  Occupational History   Not on file  Tobacco Use   Smoking status: Some Days    Packs/day: .3    Types: Cigarettes    Start date: 1998   Smokeless tobacco: Not on file   Tobacco comments:    Stopped in January  Vaping Use   Vaping Use: Never used  Substance and Sexual Activity   Alcohol use: Not Currently    Alcohol/week: 12.0 standard drinks of alcohol    Types: 12 Cans of beer per week    Comment: stopped heavy drinking 2021   Drug use: Yes    Types: Marijuana   Sexual activity: Not on file  Other Topics Concern   Not on file  Social History Narrative   Not on  file   Social Determinants of Health   Financial Resource Strain: Not on file  Food Insecurity: Not on file  Transportation Needs: Not on file  Physical Activity: Not on file  Stress: Not on file  Social Connections: Not on file  Intimate Partner Violence: Not on file    Family History  Problem Relation Age of Onset   Hypertension Mother    Cervical cancer Mother    Breast cancer Sister    Sudden Cardiac Death Brother 69   Heart disease Maternal Grandmother    Cancer Maternal Grandmother     Past Surgical History:  Procedure Laterality Date   AV FISTULA PLACEMENT Left 01/01/2022   Procedure: LEFT ARM BRACHIOCEPHALIC ARTERIOVENOUS  FISTULA CREATION;  Surgeon: Waynetta Sandy, MD;  Location: [facility];  Service: Vascular;  Laterality: Left;   IR FLUORO GUIDE CV LINE RIGHT  12/24/2021   IR FLUORO GUIDE CV LINE RIGHT  12/27/2021   IR US GUIDE VASC ACCESS RIGHT  12/24/2021   THORACOTOMY Right     ROS: Review of Systems Negative except as stated above  PHYSICAL EXAM: BP (!) 179/91 (BP Location: Left Arm, Patient Position: Sitting, Cuff Size: Normal)   Pulse 72   Temp 98.3 F (36.8 C) (Oral)   Ht '5\' 9"'$  (1.753 m)   Wt 163 lb (73.9 kg)   SpO2 97%   BMI 24.07 kg/m   Physical Exam   General appearance -middle-age African-American male who appears chronically ill but in  NAD. Mental status -patient is a bit forgetful. Neck - supple, no significant adenopathy Chest - clear to auscultation, no wheezes, rales or rhonchi, symmetric air entry Heart - normal rate, regular rhythm, normal S1, S2, no murmurs, rubs, clicks or gallops Extremities -trace edema of both lower extremities.  His dialysis graft is in the left upper arm.    12/12/2022    3:20 PM 02/15/2022    9:41 AM 12/20/2021    2:07 PM  Depression screen PHQ 2/9  Decreased Interest 0 0 0  Down, Depressed, Hopeless 1 0 0  PHQ - 2 Score 1 0 0  Altered sleeping 3    Tired, decreased energy 3    Change in appetite 3    Feeling bad or failure about yourself  1    Trouble concentrating 0    Moving slowly or fidgety/restless 0    Suicidal thoughts 0    PHQ-9 Score 11         Latest Ref Rng & Units 12/05/2022    3:23 PM 01/30/2022    6:50 PM 01/18/2022   11:46 AM  CMP  Glucose 70 - 99 mg/dL 141  96  132   BUN 6 - 20 mg/dL 49  37  26   Creatinine 0.61 - 1.24 mg/dL 6.48  7.31  6.57   Sodium 135 - 145 mmol/L 134  133  133   Potassium 3.5 - 5.1 mmol/L 3.8  3.2  3.5   Chloride 98 - 111 mmol/L 98  93  94   CO2 22 - 32 mmol/L '26  30  26   '$ Calcium 8.9 - 10.3 mg/dL 9.2  8.7  8.9   Total Protein 6.5 - 8.1 g/dL 7.5     Total Bilirubin 0.3 - 1.2 mg/dL 1.4     Alkaline Phos 38 - 126 U/L 138     AST 15 - 41 U/L 23     ALT 0 - 44 U/L 13  Lipid Panel     Component Value Date/Time   TRIG 370 (H) 12/02/2021 0305    CBC    Component Value Date/Time   WBC 5.4 12/05/2022 1523   RBC 3.83 (L) 12/05/2022 1523   HGB 10.7 (L) 12/05/2022 1523   HGB 9.0 (L) 01/18/2022 1146   HCT 32.5 (L) 12/05/2022 1523   HCT 28.1 (L) 01/18/2022 1146   PLT 222 12/05/2022 1523   PLT 227 01/18/2022 1146   MCV 84.9 12/05/2022 1523   MCV 82 01/18/2022 1146   MCH 27.9 12/05/2022 1523   MCHC 32.9 12/05/2022 1523   RDW 19.0 (H) 12/05/2022 1523   RDW 18.8 (H) 01/18/2022 1146   LYMPHSABS 0.8 12/05/2022 1523   MONOABS 0.7  12/05/2022 1523   EOSABS 0.0 12/05/2022 1523   BASOSABS 0.0 12/05/2022 1523    ASSESSMENT AND PLAN:  1. Establishing care with new doctor, encounter for   2. ESRD on dialysis [facility]) -Advised to speak with the nephrologist who comes to his hemodialysis center about possibly going to a different center if that is his desire. - Ambulatory referral to Nephrology  3. 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  Continue current medications including amlodipine 10 mg daily, clonidine 0.3 mg 3 times a day, labetalol 400 mg twice a day, nifedipine 30 mg twice a day, Bumex  4. Moderate major depression [facility]) Patient feels Zoloft is not helping.  Would like to try something else.  He reports issues sleeping at night.  We discussed changing him to Remeron to take in the evenings.  Advised that he taper off Zoloft first before starting the Remeron.  Advised that he takes Zoloft 100 mg half a tablet daily for the next 2 weeks then stop it.  Let me know if any suicidal ideation or other unpleasant side effects from Remeron Agreeable to referral to behavioral health as well. - mirtazapine (REMERON) 7.5 MG tablet; Take 1 tablet (7.5 mg total) by mouth at bedtime. Start after weaned off Zoloft.  Dispense: 30 tablet; Refill: 1  5. Impacted cerumen of right ear - Ambulatory referral to ENT  6. Tinnitus, right ear - Ambulatory referral to ENT  7. Testicular cyst - Ambulatory referral to Urology  8. Left varicocele - Ambulatory referral to Urology  9. Loud snoring Will refer for sleep study as history suggest possible sleep apnea. - [facility] Sleep Study; Future  10. Non-restorative sleep - [facility] Sleep Study; Future    Patient was given the opportunity to ask questions.  Patient verbalized understanding of the plan and was able to repeat key elements of the plan.   This documentation was completed using Radio producer.  Any  transcriptional errors are unintentional.  No orders of the defined types were placed in this encounter.    Requested Prescriptions    No prescriptions requested or ordered in this encounter    No follow-ups on file.  Karle Plumber, MD, FACP

## 2022-12-14 ENCOUNTER — Encounter: Payer: Self-pay | Admitting: Internal Medicine

## 2022-12-14 DIAGNOSIS — I130 Hypertensive heart and chronic kidney disease with heart failure and stage 1 through stage 4 chronic kidney disease, or unspecified chronic kidney disease: Secondary | ICD-10-CM | POA: Insufficient documentation

## 2022-12-14 DIAGNOSIS — F321 Major depressive disorder, single episode, moderate: Secondary | ICD-10-CM | POA: Insufficient documentation

## 2022-12-21 ENCOUNTER — Encounter: Payer: Self-pay | Admitting: Internal Medicine

## 2022-12-26 ENCOUNTER — Encounter: Payer: Self-pay | Admitting: Urology

## 2022-12-26 ENCOUNTER — Ambulatory Visit (INDEPENDENT_AMBULATORY_CARE_PROVIDER_SITE_OTHER): Payer: Medicaid Other | Admitting: Urology

## 2022-12-26 VITALS — BP 194/101 | HR 71 | Ht 69.0 in | Wt 145.0 lb

## 2022-12-26 DIAGNOSIS — N50812 Left testicular pain: Secondary | ICD-10-CM | POA: Diagnosis not present

## 2022-12-26 DIAGNOSIS — R310 Gross hematuria: Secondary | ICD-10-CM

## 2022-12-26 DIAGNOSIS — N50819 Testicular pain, unspecified: Secondary | ICD-10-CM

## 2022-12-26 LAB — URINALYSIS, ROUTINE W REFLEX MICROSCOPIC
Bilirubin, UA: NEGATIVE
Glucose, UA: NEGATIVE
Ketones, UA: NEGATIVE
Leukocytes,UA: NEGATIVE
Nitrite, UA: NEGATIVE
Specific Gravity, UA: 1.01 (ref 1.005–1.030)
Urobilinogen, Ur: 1 mg/dL (ref 0.2–1.0)
pH, UA: 5.5 (ref 5.0–7.5)

## 2022-12-26 LAB — MICROSCOPIC EXAMINATION
Cast Type: NONE SEEN
Casts: NONE SEEN /lpf
Crystal Type: NONE SEEN
Crystals: NONE SEEN
Renal Epithel, UA: NONE SEEN /hpf
Trichomonas, UA: NONE SEEN
Yeast, UA: NONE SEEN

## 2022-12-26 NOTE — Progress Notes (Signed)
Assessment: 1. Pain in testicle, unspecified laterality   2. Gross hematuria      Plan: Today I had a long discussion with the patient regarding his above issues.  Concerning his testicular discomfort I reviewed his images with him and showed him that there were no acute findings or findings likely contributing to his discomfort.  On exam I also do not feel an inguinal hernia.  I discussed with him conservative measures for his testicular discomfort including scrotal support and hot sitz bath's.  I also encouraged him to discuss with his primary care possible referral for further evaluation given his ascites which may be contributing to his abdominal bloating and discomfort  I did discuss with him recommendations for hematuria evaluation.  He has had a CT stone study and given his initial type hematuria at this point cystoscopy would be appropriate.  Nature procedure discussed in detail with him.  Will schedule next available.  Chief Complaint: abdominal bloating and left groin / testis pain  History of Present Illness:  Roy Randolph is a 47 y.o. male who is seen in consultation from Ladell Pier, MD for evaluation of hematuria and abnormal scrotal ultrasound. Past medical history is remarkable for ESRD on HD (Tu, Th, Sat), HTN, and CHF.  Patient was seen in the emergency department on 12/05/2022.  The patient was previously told he had inguinal hernia for several years but has had some increased groin pain that radiates into the left testis and presented to the emergency department for further evaluation.  The patient also reported to the ER physician having some intermittent hematuria.  He reports seeing blood primarily initial type hematuria a few months ago that cleared with his stream.  Has not seen past month.  Urinalysis on 12/05/22 was negative without significant hematuria.  Patient also complains of abdominal bloating and discomfort. Patient underwent CT stone study and  scrotal ultrasound on 12/05/2022.  I reviewed those images today which revealed bilateral testicular microlithiasis, a left-sided varicocele, and a small right-sided testicular cyst and separate right sided epididymal cyst/spermatocele. On CT scan there were no suspicious renal lesions stones or hydro-.  There was also no evidence of any bladder wall thickening and the prostate was unremarkable.  Past Medical History:  Past Medical History:  Diagnosis Date   Abnormal EKG 10/10/2021   Acute renal failure superimposed on stage 4 chronic kidney disease ([place]) 10/10/2021   Alcohol use    Alcohol use disorder in remission 11/14/2021   Elevated troponin 10/10/2021   Epigastric pain 10/10/2021   H/O alcohol abuse 10/10/2021   History of migraine 11/14/2021   Hypertension    Hypertensive emergency due to non compliance 10/10/2021   Hypokalemia 10/10/2021   Left arm numbness and pain 10/10/2021   Marijuana use 10/10/2021   Noncompliance    NSAID long-term use 10/10/2021   PRES (posterior reversible encephalopathy syndrome)    Smoker 10/10/2021   Smoking    Syncope, vasovagal 10/10/2021   Thrombocytopenia ([place]) 10/10/2021   Thrombocytopenia ([place]) 10/10/2021   Vision disturbance 11/14/2021   Vomiting 10/10/2021    Past Surgical History:  Past Surgical History:  Procedure Laterality Date   AV FISTULA PLACEMENT Left 01/01/2022   Procedure: LEFT ARM BRACHIOCEPHALIC ARTERIOVENOUS  FISTULA CREATION;  Surgeon: Waynetta Sandy, MD;  Location: [facility];  Service: Vascular;  Laterality: Left;   IR FLUORO GUIDE CV LINE RIGHT  12/24/2021   IR FLUORO GUIDE CV LINE RIGHT  12/27/2021   IR US  GUIDE VASC ACCESS RIGHT  12/24/2021   THORACOTOMY Right     Allergies:  Allergies  Allergen Reactions   Cucumber Extract Anaphylaxis and Itching   Other Anaphylaxis, Itching, Swelling and Other (See Comments)    The patient CANNOT eat raw/fresh fruit or vegetables. Reaction starts with ears itching, then progresses from  there. He can eat canned versions of these foods, however.   Tomato (Diagnostic) Anaphylaxis and Swelling   Wild Lettuce [Wild Lettuce Extract (Lactuca Virosa)] Anaphylaxis and Swelling   Hydralazine Hives, Itching and Other (See Comments)    SEVERE ITCHING    Family History:  Family History  Problem Relation Age of Onset   Hypertension Mother    Cervical cancer Mother    Breast cancer Sister    Sudden Cardiac Death Brother 20   Heart disease Maternal Grandmother    Cancer Maternal Grandmother     Social History:  Social History   Tobacco Use   Smoking status: Some Days    Packs/day: .3    Types: Cigarettes    Start date: 1998   Tobacco comments:    Stopped in January  Vaping Use   Vaping Use: Never used  Substance Use Topics   Alcohol use: Not Currently    Alcohol/week: 12.0 standard drinks of alcohol    Types: 12 Cans of beer per week    Comment: stopped heavy drinking 2021   Drug use: Yes    Types: Marijuana    Review of symptoms:  Constitutional:  Negative for unexplained weight loss, night sweats, fever, chills ENT:  Negative for nose bleeds, sinus pain, painful swallowing CV:  Negative for chest pain, shortness of breath, exercise intolerance, palpitations, loss of consciousness Resp:  Negative for cough, wheezing, shortness of breath GI:  Negative for nausea, vomiting, diarrhea, bloody stools GU:  Positives noted in HPI; otherwise negative for gross hematuria, dysuria, urinary incontinence Neuro:  Negative for seizures, poor balance, limb weakness, slurred speech Psych:  Negative for lack of energy, depression, anxiety Endocrine:  Negative for polydipsia, polyuria, symptoms of hypoglycemia (dizziness, hunger, sweating) Hematologic:  Negative for anemia, purpura, petechia, prolonged or excessive bleeding, use of anticoagulants  Allergic:  Negative for difficulty breathing or choking as a result of exposure to anything; no shellfish allergy; no allergic response  (rash/itch) to materials, foods  Physical exam: BP (!) 194/101   Pulse 71   Ht 5\' 9"  (1.753 m)   Wt 145 lb (65.8 kg) Comment: pt reported  BMI 21.41 kg/m  GENERAL APPEARANCE:  Well appearing, well developed, well nourished, NAD HEENT: Atraumatic, Normocephalic ABDOMEN: protuberant, nontender  GU: Normal phallus.  Left testis and epididymis palpably normal.  Left-sided varicocele present.  Testis moderately tender to palpation. Right testis palpably normal.  Small epididymal cyst noted.   No evidence of hernia bilaterally  EXTREMITIES: Moves all extremities well.   NEUROLOGIC:  Alert and oriented x 3, normal gait SKIN:  Warm, dry and intact.    Results: Scrotal ultrasound 12/05/2022-- IMPRESSION: Testicular microlithiasis.   Small benign right-sided testicular cyst and separate right-sided epididymal cyst or spermatocele.   Left-sided varicocele.   Preserved blood flow to the testicles.  CT-stone study 12/05/2022-- IMPRESSION: 1. Small to moderate volume ascites.  No abnormal bowel dilation. 2. Small fat-containing paraumbilical hernia. 3. Left para-aortic lymph nodes measure up to 1.1 cm, likely reactive. 4. Multichamber cardiomegaly. 5. Aortic Atherosclerosis (ICD10-I70.0).

## 2022-12-26 NOTE — Addendum Note (Signed)
Addended by: Andria Rhein L on: 12/26/2022 03:00 PM   Modules accepted: Orders

## 2023-01-15 ENCOUNTER — Other Ambulatory Visit: Payer: Self-pay | Admitting: Nephrology

## 2023-01-15 DIAGNOSIS — N186 End stage renal disease: Secondary | ICD-10-CM

## 2023-01-22 ENCOUNTER — Other Ambulatory Visit: Payer: Medicaid Other | Admitting: Urology

## 2023-01-22 ENCOUNTER — Telehealth: Payer: Self-pay | Admitting: Internal Medicine

## 2023-01-22 ENCOUNTER — Encounter: Payer: Self-pay | Admitting: Urology

## 2023-01-22 NOTE — Telephone Encounter (Signed)
Letter & notes have been successfully faxed to [facility] on 01/22/2023.

## 2023-01-22 NOTE — Telephone Encounter (Signed)
Patient's sleep study was denied by [facility].  Phone call placed to them today to try to do an appeal.  I spoke with Sarah B.  She informed me that I would need to submit a letter to the appeals department along with a copy of my note.  Fax number given was [phone].

## 2023-01-22 NOTE — Progress Notes (Deleted)
Assessment: No diagnosis found.  Plan: ***  Chief Complaint: No chief complaint on file.   HPI: Roy Randolph is a 47 y.o. male who presents for continued evaluation of hematuria. Please see my note 12/22/2022 at the time of initial visit for detailed history and exam   Portions of the above documentation were copied from a prior visit for review purposes only.  Allergies: Allergies  Allergen Reactions   Cucumber Extract Anaphylaxis and Itching   Other Anaphylaxis, Itching, Swelling and Other (See Comments)    The patient CANNOT eat raw/fresh fruit or vegetables. Reaction starts with ears itching, then progresses from there. He can eat canned versions of these foods, however.   Tomato (Diagnostic) Anaphylaxis and Swelling   Wild Lettuce [Wild Lettuce Extract (Lactuca Virosa)] Anaphylaxis and Swelling   Hydralazine Hives, Itching and Other (See Comments)    SEVERE ITCHING    PMH: Past Medical History:  Diagnosis Date   Abnormal EKG 10/10/2021   Acute renal failure superimposed on stage 4 chronic kidney disease (HCC) 10/10/2021   Alcohol use    Alcohol use disorder in remission 11/14/2021   Elevated troponin 10/10/2021   Epigastric pain 10/10/2021   H/O alcohol abuse 10/10/2021   History of migraine 11/14/2021   Hypertension    Hypertensive emergency due to non compliance 10/10/2021   Hypokalemia 10/10/2021   Left arm numbness and pain 10/10/2021   Marijuana use 10/10/2021   Noncompliance    NSAID long-term use 10/10/2021   PRES (posterior reversible encephalopathy syndrome)    Smoker 10/10/2021   Smoking    Syncope, vasovagal 10/10/2021   Thrombocytopenia (HCC) 10/10/2021   Thrombocytopenia (HCC) 10/10/2021   Vision disturbance 11/14/2021   Vomiting 10/10/2021    PSH: Past Surgical History:  Procedure Laterality Date   AV FISTULA PLACEMENT Left 01/01/2022   Procedure: LEFT ARM BRACHIOCEPHALIC ARTERIOVENOUS  FISTULA CREATION;  Surgeon: Maeola Harman, MD;   Location: [facility] OR;  Service: Vascular;  Laterality: Left;   IR FLUORO GUIDE CV LINE RIGHT  12/24/2021   IR FLUORO GUIDE CV LINE RIGHT  12/27/2021   IR US GUIDE VASC ACCESS RIGHT  12/24/2021   THORACOTOMY Right     SH: Social History   Tobacco Use   Smoking status: Some Days    Packs/day: .3    Types: Cigarettes    Start date: 1998   Tobacco comments:    Stopped in January  Vaping Use   Vaping Use: Never used  Substance Use Topics   Alcohol use: Not Currently    Alcohol/week: 12.0 standard drinks of alcohol    Types: 12 Cans of beer per week    Comment: stopped heavy drinking 2021   Drug use: Yes    Types: Marijuana    ROS: Constitutional:  Negative for fever, chills, weight loss CV: Negative for chest pain, previous MI, hypertension Respiratory:  Negative for shortness of breath, wheezing, sleep apnea, frequent cough GI:  Negative for nausea, vomiting, bloody stool, GERD  PE: There were no vitals taken for this visit. GENERAL APPEARANCE:  Well appearing, well developed, well nourished, NAD HEENT:  Atraumatic, normocephalic, oropharynx clear NECK:  Supple without lymphadenopathy or thyromegaly ABDOMEN:  Soft, non-tender, no masses EXTREMITIES:  Moves all extremities well, without clubbing, cyanosis, or edema NEUROLOGIC:  Alert and oriented x 3, normal gait, CN II-XII grossly intact MENTAL STATUS:  appropriate BACK:  Non-tender to palpation, No CVAT SKIN:  Warm, dry, and intact   Results: No results  found for this or any previous visit (from the past 24 hour(s)).

## 2023-01-28 ENCOUNTER — Ambulatory Visit
Admission: RE | Admit: 2023-01-28 | Discharge: 2023-01-28 | Disposition: A | Discharge status: Home / Self Care | Payer: Medicaid Other | Source: Ambulatory Visit | Attending: Nephrology | Admitting: Nephrology

## 2023-01-28 DIAGNOSIS — N186 End stage renal disease: Secondary | ICD-10-CM

## 2023-02-05 ENCOUNTER — Telehealth: Payer: Self-pay | Admitting: Internal Medicine

## 2023-02-07 NOTE — Telephone Encounter (Signed)
Called & spoke to the patient. Verified name & DOB. Informed that mailing will significantly slow down the process and paperwork must be received before 02/21/2023 otherwise the request will automatically be denied and the case will be closed. Advised patient to mail back paperwork the same day it is received. Patient expressed understanding and will also fax back to the office just in case. Paperwork sent via mail.

## 2023-02-09 ENCOUNTER — Encounter (HOSPITAL_COMMUNITY): Payer: Self-pay | Admitting: Pharmacy Technician

## 2023-02-09 ENCOUNTER — Emergency Department (HOSPITAL_COMMUNITY): Payer: Medicaid Other

## 2023-02-09 ENCOUNTER — Other Ambulatory Visit: Payer: Self-pay

## 2023-02-09 ENCOUNTER — Inpatient Hospital Stay (HOSPITAL_COMMUNITY): Payer: Medicaid Other

## 2023-02-09 ENCOUNTER — Inpatient Hospital Stay (HOSPITAL_COMMUNITY)
Admission: EM | Admit: 2023-02-09 | Discharge: 2023-02-10 | DRG: 270 | Discharge status: Against Medical Advice | Payer: Medicaid Other | Attending: Pulmonary Disease | Admitting: Pulmonary Disease

## 2023-02-09 DIAGNOSIS — R1011 Right upper quadrant pain: Secondary | ICD-10-CM

## 2023-02-09 DIAGNOSIS — Z888 Allergy status to other drugs, medicaments and biological substances status: Secondary | ICD-10-CM | POA: Diagnosis not present

## 2023-02-09 DIAGNOSIS — F17210 Nicotine dependence, cigarettes, uncomplicated: Secondary | ICD-10-CM | POA: Diagnosis present

## 2023-02-09 DIAGNOSIS — N186 End stage renal disease: Secondary | ICD-10-CM | POA: Diagnosis present

## 2023-02-09 DIAGNOSIS — I161 Hypertensive emergency: Secondary | ICD-10-CM | POA: Diagnosis present

## 2023-02-09 DIAGNOSIS — Z992 Dependence on renal dialysis: Secondary | ICD-10-CM | POA: Diagnosis not present

## 2023-02-09 DIAGNOSIS — G4733 Obstructive sleep apnea (adult) (pediatric): Secondary | ICD-10-CM | POA: Diagnosis present

## 2023-02-09 DIAGNOSIS — K7689 Other specified diseases of liver: Secondary | ICD-10-CM | POA: Diagnosis present

## 2023-02-09 DIAGNOSIS — Z91018 Allergy to other foods: Secondary | ICD-10-CM | POA: Diagnosis not present

## 2023-02-09 DIAGNOSIS — Z5320 Procedure and treatment not carried out because of patient's decision for unspecified reasons: Secondary | ICD-10-CM | POA: Diagnosis present

## 2023-02-09 DIAGNOSIS — I728 Aneurysm of other specified arteries: Secondary | ICD-10-CM | POA: Diagnosis present

## 2023-02-09 DIAGNOSIS — Z79899 Other long term (current) drug therapy: Secondary | ICD-10-CM

## 2023-02-09 DIAGNOSIS — I120 Hypertensive chronic kidney disease with stage 5 chronic kidney disease or end stage renal disease: Secondary | ICD-10-CM | POA: Diagnosis present

## 2023-02-09 HISTORY — PX: IR EMBO ART  VEN HEMORR LYMPH EXTRAV  INC GUIDE ROADMAPPING: IMG5450

## 2023-02-09 HISTORY — PX: IR US GUIDE VASC ACCESS RIGHT: IMG2390

## 2023-02-09 HISTORY — PX: IR ANGIOGRAM VISCERAL SELECTIVE: IMG657

## 2023-02-09 LAB — BASIC METABOLIC PANEL
Anion gap: 13 (ref 5–15)
BUN: 37 mg/dL — ABNORMAL HIGH (ref 6–20)
CO2: 24 mmol/L (ref 22–32)
Calcium: 9.7 mg/dL (ref 8.9–10.3)
Chloride: 99 mmol/L (ref 98–111)
Creatinine, Ser: 5.66 mg/dL — ABNORMAL HIGH (ref 0.61–1.24)
GFR, Estimated: 12 mL/min — ABNORMAL LOW (ref >60)
Glucose, Bld: 130 mg/dL — ABNORMAL HIGH (ref 70–99)
Potassium: 4.6 mmol/L (ref 3.5–5.1)
Sodium: 136 mmol/L (ref 135–145)

## 2023-02-09 LAB — CBC
HCT: 35.9 % — ABNORMAL LOW (ref 39.0–52.0)
HCT: 33.2 % — ABNORMAL LOW (ref 39.0–52.0)
Hemoglobin: 11.3 g/dL — ABNORMAL LOW (ref 13.0–17.0)
Hemoglobin: 10.4 g/dL — ABNORMAL LOW (ref 13.0–17.0)
MCH: 29.2 pg (ref 26.0–34.0)
MCH: 28.9 pg (ref 26.0–34.0)
MCHC: 31.5 g/dL (ref 30.0–36.0)
MCHC: 31.3 g/dL (ref 30.0–36.0)
MCV: 92.8 fL (ref 80.0–100.0)
MCV: 92.2 fL (ref 80.0–100.0)
Platelets: 176 10*3/uL (ref 150–400)
Platelets: 161 10*3/uL (ref 150–400)
RBC: 3.87 MIL/uL — ABNORMAL LOW (ref 4.22–5.81)
RBC: 3.6 MIL/uL — ABNORMAL LOW (ref 4.22–5.81)
RDW: 17.8 % — ABNORMAL HIGH (ref 11.5–15.5)
RDW: 17.5 % — ABNORMAL HIGH (ref 11.5–15.5)
WBC: 8 10*3/uL (ref 4.0–10.5)
WBC: 7.9 10*3/uL (ref 4.0–10.5)
nRBC: 0 % (ref 0.0–0.2)
nRBC: 0 % (ref 0.0–0.2)

## 2023-02-09 LAB — PROTIME-INR
INR: 1.2 (ref 0.8–1.2)
Prothrombin Time: 15 seconds (ref 11.4–15.2)

## 2023-02-09 LAB — TROPONIN I (HIGH SENSITIVITY)
Troponin I (High Sensitivity): 43 ng/L — ABNORMAL HIGH (ref <18)
Troponin I (High Sensitivity): 41 ng/L — ABNORMAL HIGH (ref <18)

## 2023-02-09 LAB — LIPASE, BLOOD: Lipase: 32 U/L (ref 11–51)

## 2023-02-09 LAB — HEPATIC FUNCTION PANEL
ALT: 25 U/L (ref 0–44)
AST: 46 U/L — ABNORMAL HIGH (ref 15–41)
Albumin: 3.5 g/dL (ref 3.5–5.0)
Alkaline Phosphatase: 172 U/L — ABNORMAL HIGH (ref 38–126)
Bilirubin, Direct: 0.5 mg/dL — ABNORMAL HIGH (ref 0.0–0.2)
Indirect Bilirubin: 0.8 mg/dL (ref 0.3–0.9)
Total Bilirubin: 1.3 mg/dL — ABNORMAL HIGH (ref 0.3–1.2)
Total Protein: 9.1 g/dL — ABNORMAL HIGH (ref 6.5–8.1)

## 2023-02-09 LAB — TYPE AND SCREEN
ABO/RH(D): O POS
Antibody Screen: NEGATIVE

## 2023-02-09 LAB — MRSA NEXT GEN BY PCR, NASAL: MRSA by PCR Next Gen: DETECTED — AB

## 2023-02-09 LAB — HIV ANTIBODY (ROUTINE TESTING W REFLEX): HIV Screen 4th Generation wRfx: NONREACTIVE

## 2023-02-09 MED ORDER — ACETAMINOPHEN 325 MG PO TABS
650.0000 mg | ORAL_TABLET | Freq: Four times a day (QID) | ORAL | Status: DC | PRN
  Administered 2023-02-10: 650 mg via ORAL
  Filled 2023-02-09: qty 2

## 2023-02-09 MED ORDER — HYDROMORPHONE HCL 1 MG/ML IJ SOLN
0.5000 mg | Freq: Once | INTRAMUSCULAR | Status: AC
  Administered 2023-02-09: 0.5 mg via INTRAVENOUS
  Filled 2023-02-09: qty 1

## 2023-02-09 MED ORDER — MORPHINE SULFATE (PF) 4 MG/ML IV SOLN
4.0000 mg | Freq: Once | INTRAVENOUS | Status: AC
  Administered 2023-02-09: 4 mg via INTRAVENOUS
  Filled 2023-02-09: qty 1

## 2023-02-09 MED ORDER — FENTANYL CITRATE (PF) 100 MCG/2ML IJ SOLN
INTRAMUSCULAR | Status: AC
  Filled 2023-02-09: qty 2

## 2023-02-09 MED ORDER — CLEVIDIPINE BUTYRATE 0.5 MG/ML IV EMUL
0.0000 mg/h | INTRAVENOUS | Status: DC
  Administered 2023-02-09 (×2): 21 mg/h via INTRAVENOUS
  Administered 2023-02-09: 2 mg/h via INTRAVENOUS
  Administered 2023-02-10: 13 mg/h via INTRAVENOUS
  Administered 2023-02-10: 17 mg/h via INTRAVENOUS
  Administered 2023-02-10: 14 mg/h via INTRAVENOUS
  Administered 2023-02-10: 19 mg/h via INTRAVENOUS
  Filled 2023-02-09 (×7): qty 100

## 2023-02-09 MED ORDER — ONDANSETRON HCL 4 MG/2ML IJ SOLN
4.0000 mg | Freq: Once | INTRAMUSCULAR | Status: AC
  Administered 2023-02-09: 4 mg via INTRAVENOUS
  Filled 2023-02-09: qty 2

## 2023-02-09 MED ORDER — AMLODIPINE BESYLATE 5 MG PO TABS
10.0000 mg | ORAL_TABLET | Freq: Every day | ORAL | Status: DC
  Administered 2023-02-09 – 2023-02-10 (×2): 10 mg via ORAL
  Filled 2023-02-09 (×2): qty 2

## 2023-02-09 MED ORDER — LIDOCAINE HCL 1 % IJ SOLN
INTRAMUSCULAR | Status: AC
  Filled 2023-02-09: qty 20

## 2023-02-09 MED ORDER — LABETALOL HCL 200 MG PO TABS
400.0000 mg | ORAL_TABLET | Freq: Two times a day (BID) | ORAL | Status: DC
  Administered 2023-02-09 – 2023-02-10 (×2): 400 mg via ORAL
  Filled 2023-02-09 (×2): qty 2

## 2023-02-09 MED ORDER — HYDROXYZINE HCL 25 MG PO TABS
25.0000 mg | ORAL_TABLET | Freq: Once | ORAL | Status: AC | PRN
  Administered 2023-02-09: 25 mg via ORAL
  Filled 2023-02-09: qty 1

## 2023-02-09 MED ORDER — CHLORHEXIDINE GLUCONATE CLOTH 2 % EX PADS
6.0000 | MEDICATED_PAD | Freq: Every day | CUTANEOUS | Status: DC
  Administered 2023-02-10: 6 via TOPICAL

## 2023-02-09 MED ORDER — HYDROMORPHONE HCL 1 MG/ML IJ SOLN
2.0000 mg | Freq: Once | INTRAMUSCULAR | Status: AC
  Administered 2023-02-09: 2 mg via INTRAVENOUS
  Filled 2023-02-09: qty 2

## 2023-02-09 MED ORDER — POLYETHYLENE GLYCOL 3350 17 G PO PACK: 17.0000 g | PACK | Freq: Every day | ORAL | Status: DC | PRN

## 2023-02-09 MED ORDER — DOCUSATE SODIUM 100 MG PO CAPS: 100.0000 mg | ORAL_CAPSULE | Freq: Two times a day (BID) | ORAL | Status: DC | PRN

## 2023-02-09 MED ORDER — FENTANYL CITRATE (PF) 100 MCG/2ML IJ SOLN
INTRAMUSCULAR | Status: AC | PRN
  Administered 2023-02-09: 25 ug via INTRAVENOUS

## 2023-02-09 MED ORDER — HYDROMORPHONE HCL 1 MG/ML IJ SOLN
1.0000 mg | INTRAMUSCULAR | Status: DC | PRN
  Administered 2023-02-09 (×2): 1 mg via INTRAVENOUS
  Filled 2023-02-09 (×2): qty 1

## 2023-02-09 MED ORDER — CLONIDINE HCL 0.2 MG PO TABS
0.3000 mg | ORAL_TABLET | Freq: Three times a day (TID) | ORAL | Status: DC
  Administered 2023-02-09 – 2023-02-10 (×2): 0.3 mg via ORAL
  Filled 2023-02-09 (×5): qty 1

## 2023-02-09 MED ORDER — BUMETANIDE 2 MG PO TABS: 2.0000 mg | ORAL_TABLET | Freq: Two times a day (BID) | ORAL | Status: DC

## 2023-02-09 MED ORDER — PANTOPRAZOLE SODIUM 40 MG PO TBEC
40.0000 mg | DELAYED_RELEASE_TABLET | Freq: Every day | ORAL | Status: DC
  Administered 2023-02-09 – 2023-02-10 (×2): 40 mg via ORAL
  Filled 2023-02-09 (×2): qty 1

## 2023-02-09 MED ORDER — LABETALOL HCL 5 MG/ML IV SOLN
10.0000 mg | INTRAVENOUS | Status: DC | PRN
  Administered 2023-02-09 – 2023-02-10 (×2): 10 mg via INTRAVENOUS
  Filled 2023-02-09 (×2): qty 4

## 2023-02-09 MED ORDER — MIDAZOLAM HCL 2 MG/2ML IJ SOLN
INTRAMUSCULAR | Status: AC
  Filled 2023-02-09: qty 2

## 2023-02-09 MED ORDER — VITAMIN D (ERGOCALCIFEROL) 1.25 MG (50000 UNIT) PO CAPS
50000.0000 [IU] | ORAL_CAPSULE | ORAL | Status: DC
  Administered 2023-02-10: 50000 [IU] via ORAL
  Filled 2023-02-09: qty 1

## 2023-02-09 MED ORDER — MIDAZOLAM HCL 2 MG/2ML IJ SOLN
INTRAMUSCULAR | Status: AC | PRN
  Administered 2023-02-09: 1 mg via INTRAVENOUS

## 2023-02-09 MED ORDER — CALCIUM ACETATE (PHOS BINDER) 667 MG PO CAPS
667.0000 mg | ORAL_CAPSULE | Freq: Three times a day (TID) | ORAL | Status: DC
  Administered 2023-02-10: 667 mg via ORAL
  Filled 2023-02-09: qty 1

## 2023-02-09 MED ORDER — SODIUM CHLORIDE 0.9 % IV SOLN
6.2500 mg | Freq: Four times a day (QID) | INTRAVENOUS | Status: DC | PRN
  Administered 2023-02-09: 6.25 mg via INTRAVENOUS
  Filled 2023-02-09: qty 0.25

## 2023-02-09 MED ORDER — SERTRALINE HCL 50 MG PO TABS: 50.0000 mg | ORAL_TABLET | Freq: Every day | ORAL | Status: DC

## 2023-02-09 MED ORDER — FENTANYL CITRATE PF 50 MCG/ML IJ SOSY
50.0000 ug | PREFILLED_SYRINGE | Freq: Once | INTRAMUSCULAR | Status: AC
  Administered 2023-02-09: 50 ug via INTRAVENOUS
  Filled 2023-02-09: qty 1

## 2023-02-09 MED ORDER — HYDROMORPHONE HCL 1 MG/ML IJ SOLN
2.0000 mg | INTRAMUSCULAR | Status: DC | PRN
  Filled 2023-02-09: qty 2

## 2023-02-09 MED ORDER — IOHEXOL 350 MG/ML SOLN
75.0000 mL | Freq: Once | INTRAVENOUS | Status: AC | PRN
  Administered 2023-02-09: 75 mL via INTRAVENOUS

## 2023-02-09 MED ORDER — RENA-VITE PO TABS
1.0000 | ORAL_TABLET | Freq: Every day | ORAL | Status: DC
  Administered 2023-02-10: 1 via ORAL
  Filled 2023-02-09: qty 1

## 2023-02-09 NOTE — H&P (Signed)
NAME:  Roy Randolph, MRN:  [id number], DOB:  [birth year], LOS: 0 ADMISSION DATE:  02/09/2023, CONSULTATION DATE:  02/09/23 REFERRING MD:  Tegeler, CHIEF COMPLAINT:  back pain   History of Present Illness:  47 year old man w/ hx of ESRD p/w severe onset R back pain pleuritic in nature.  Has hx of PTX in past so thought it might be this.  Came in and imaging reveals acute hepatic bleed.  IR mobilizing to coil.   Too nauseous to tolerate his 5 home BP meds.  Started on cardene and PCCM to admit.  Denies trauma.  Pertinent  Medical History  ESRD HTN Prior PRES  Significant Hospital Events: Including procedures, antibiotic start and stop dates in addition to other pertinent events   5/12 admit  Interim History / Subjective:  Admitting  Objective   Blood pressure (!) 209/118, pulse 89, temperature 97.8 F (36.6 C), temperature source Oral, resp. rate 20, height 5\' 9"  (1.753 m), weight 65.8 kg, SpO2 99 %.       No intake or output data in the 24 hours ending 02/09/23 1714 Filed Weights   02/09/23 1056  Weight: 65.8 kg    Examination: General: no distress HENT: MMM, trachea midline Lungs: Clear, no  wheezing Cardiovascular: RRR, ext warm Abdomen: TTP RUQ Extremities: Warm, no clubbing Neuro: moves ext to command Skin: No rashes  Patient Lines/Drains/Airways Status     Active Line/Drains/Airways     Name Placement date Placement time Site Days   Peripheral IV 02/09/23 20 G Anterior;Right Forearm 02/09/23  1051  Forearm  less than 1   Peripheral IV 02/09/23 20 G Right Antecubital 02/09/23  1716  Antecubital  less than 1   Fistula / Graft Left Upper arm Arteriovenous fistula 01/01/22  1226  Upper arm  404   Hemodialysis Catheter Right Internal jugular Double lumen Permanent (Tunneled) 12/27/21  0917  Internal jugular  409             Resolved Hospital Problem list   N/A  Assessment & Plan:  Acute spontaneous hepatic hematoma ESRD on HD Hypertensive  emergency  - Not tolerating PO, cleviprex to SBP < 160 - Pain/nausea control as ordered - Appreciate IR help with angio-coiling - Watch CBC, check INR - Avoid hypothermia, acidemia, coagulopathy  Best Practice (right click and "Reselect all SmartList Selections" daily)   Diet/type: NPO DVT prophylaxis: SCD GI prophylaxis: N/A Lines: N/A Foley:  N/A Code Status:  full code Last date of multidisciplinary goals of care discussion [Updated patient and sister]  Labs   CBC: Recent Labs  Lab 02/09/23 0950  WBC 8.0  HGB 11.3*  HCT 35.9*  MCV 92.8  PLT 176    Basic Metabolic Panel: Recent Labs  Lab 02/09/23 0950  NA 136  K 4.6  CL 99  CO2 24  GLUCOSE 130*  BUN 37*  CREATININE 5.66*  CALCIUM 9.7   GFR: Estimated Creatinine Clearance: 15.2 mL/min (A) (by C-G formula based on SCr of 5.66 mg/dL (H)). Recent Labs  Lab 02/09/23 0950  WBC 8.0    Liver Function Tests: Recent Labs  Lab 02/09/23 1052  AST 46*  ALT 25  ALKPHOS 172*  BILITOT 1.3*  PROT 9.1*  ALBUMIN 3.5   Recent Labs  Lab 02/09/23 1052  LIPASE 32   No results for input(s): "AMMONIA" in the last 168 hours.  ABG    Component Value Date/Time   TCO2 28 01/01/2022 1108  Coagulation Profile: No results for input(s): "INR", "PROTIME" in the last 168 hours.  Cardiac Enzymes: No results for input(s): "CKTOTAL", "CKMB", "CKMBINDEX", "TROPONINI" in the last 168 hours.  HbA1C: Hgb A1c MFr Bld  Date/Time Value Ref Range Status  10/10/2021 05:58 PM 4.7 (L) 4.8 - 5.6 % Final    Comment:    (NOTE) Pre diabetes:          5.7%-6.4%  Diabetes:              >6.4%  Glycemic control for   <7.0% adults with diabetes     CBG: No results for input(s): "GLUCAP" in the last 168 hours.  Review of Systems:    Positive Symptoms in bold:  Constitutional fevers, chills, weight loss, fatigue, anorexia, malaise  Eyes decreased vision, double vision, eye irritation  Ears, Nose, Mouth, Throat sore  throat, trouble swallowing, sinus congestion  Cardiovascular chest pain, paroxysmal nocturnal dyspnea, lower ext edema, palpitations   Respiratory SOB, cough, DOE, hemoptysis, wheezing  Gastrointestinal nausea, vomiting, diarrhea  Genitourinary burning with urination, trouble urinating  Musculoskeletal joint aches, joint swelling, back pain  Integumentary  rashes, skin lesions  Neurological focal weakness, focal numbness, trouble speaking, headaches  Psychiatric depression, anxiety, confusion  Endocrine polyuria, polydipsia, cold intolerance, heat intolerance  Hematologic abnormal bruising, abnormal bleeding, unexplained nose bleeds  Allergic/Immunologic recurrent infections, hives, swollen lymph nodes     Past Medical History:  He,  has a past medical history of Abnormal EKG (10/10/2021), Acute renal failure superimposed on stage 4 chronic kidney disease (HCC) (10/10/2021), Alcohol use, Alcohol use disorder in remission (11/14/2021), Elevated troponin (10/10/2021), Epigastric pain (10/10/2021), H/O alcohol abuse (10/10/2021), History of migraine (11/14/2021), Hypertension, Hypertensive emergency due to non compliance (10/10/2021), Hypokalemia (10/10/2021), Left arm numbness and pain (10/10/2021), Marijuana use (10/10/2021), Noncompliance, NSAID long-term use (10/10/2021), PRES (posterior reversible encephalopathy syndrome), Smoker (10/10/2021), Smoking, Syncope, vasovagal (10/10/2021), Thrombocytopenia (HCC) (10/10/2021), Thrombocytopenia (HCC) (10/10/2021), Vision disturbance (11/14/2021), and Vomiting (10/10/2021).   Surgical History:   Past Surgical History:  Procedure Laterality Date   AV FISTULA PLACEMENT Left 01/01/2022   Procedure: LEFT ARM BRACHIOCEPHALIC ARTERIOVENOUS  FISTULA CREATION;  Surgeon: Maeola Harman, MD;  Location: [facility] OR;  Service: Vascular;  Laterality: Left;   IR FLUORO GUIDE CV LINE RIGHT  12/24/2021   IR FLUORO GUIDE CV LINE RIGHT  12/27/2021   IR US GUIDE VASC ACCESS RIGHT   12/24/2021   THORACOTOMY Right      Social History:   reports that he has been smoking cigarettes. He started smoking about 26 years ago. He has been smoking an average of .3 packs per day. He does not have any smokeless tobacco history on file. He reports that he does not currently use alcohol after a past usage of about 12.0 standard drinks of alcohol per week. He reports current drug use. Drug: Marijuana.   Family History:  His family history includes Breast cancer in his sister; Cancer in his maternal grandmother; Cervical cancer in his mother; Heart disease in his maternal grandmother; Hypertension in his mother; Sudden Cardiac Death (age of onset: 53) in his brother.   Allergies Allergies  Allergen Reactions   Cucumber Extract Anaphylaxis and Itching   Other Anaphylaxis, Itching, Swelling and Other (See Comments)    The patient CANNOT eat raw/fresh fruit or vegetables. Reaction starts with ears itching, then progresses from there. He can eat canned versions of these foods, however.   Tomato (Diagnostic) Anaphylaxis and Swelling   Wild Lettuce [Wild  Lettuce Extract (Lactuca Virosa)] Anaphylaxis and Swelling   Hydralazine Hives, Itching and Other (See Comments)    SEVERE ITCHING     Home Medications  Prior to Admission medications   Medication Sig Start Date End Date Taking? Authorizing Provider  acetaminophen (TYLENOL) 325 MG tablet Take 2 tablets (650 mg total) by mouth every 6 (six) hours as needed for fever or mild pain. 01/02/22   Katsadouros, Vasilios, MD  amLODipine (NORVASC) 10 MG tablet Take 1 tablet (10 mg total) by mouth daily. 02/15/22   Ellison Carwin, MD  bumetanide (BUMEX) 2 MG tablet Take 2 mg by mouth 2 (two) times daily. Take 1 tablet (2MG ) by mouth twice daily 11/17/22   [provider]  calcium acetate (PHOSLO) 667 MG capsule Take 1 capsule (667 mg total) by mouth 3 (three) times daily with meals. 01/18/22   Dellia Cloud, MD  cloNIDine (CATAPRES) 0.3 MG tablet  Take 1 tablet (0.3 mg total) by mouth 3 (three) times daily. 12/05/22   Jacalyn Lefevre, MD  Darbepoetin Alfa (ARANESP) 300 MCG/0.6ML SOSY injection Inject 0.6 mLs (300 mcg total) into the vein every dialysis (Tu,Th,Sat). 01/02/22   Katsadouros, Vasilios, MD  labetalol (NORMODYNE) 200 MG tablet Take 2 tablets (400 mg total) by mouth 2 (two) times daily. 02/15/22   Ellison Carwin, MD  mirtazapine (REMERON) 7.5 MG tablet Take 1 tablet (7.5 mg total) by mouth at bedtime. Start after weaned off Zoloft. Patient not taking: Reported on 12/26/2022 12/12/22   Marcine Matar, MD  multivitamin (RENA-VIT) TABS tablet Take 1 tablet by mouth daily.    [provider]  NIFEdipine (ADALAT CC) 30 MG 24 hr tablet Take 30 mg by mouth 2 (two) times daily.  Take 1 tablet (30 MG) by mouth every 12 hours 09/24/22   [provider]  pantoprazole (PROTONIX) 40 MG tablet Take 1 tablet (40 mg total) by mouth daily. 02/15/22   Ellison Carwin, MD  sertraline (ZOLOFT) 50 MG tablet Take 1 tablet (50 mg total) by mouth daily. 02/15/22   Ellison Carwin, MD  Vitamin D, Ergocalciferol, (DRISDOL) 1.25 MG (50000 UNIT) CAPS capsule Take 1 capsule (50,000 Units total) by mouth once a week. 02/07/22   Penninger, Lillia Abed, PA     Critical care time: 31 minutes

## 2023-02-09 NOTE — ED Provider Notes (Signed)
Care assumed from Dr. Theresia Lo.  At time of transfer care, patient waiting for results of CT of the torso to look for concerning etiology of the patient's central and right upper quadrant abdominal pain and tenderness that woke him up this morning.  CT just returned showing evidence of a large round vascular structure in the liver that is likely an AV fistula.  I looked at previous imaging and did not see evidence that this was present in the past.  I went to assess the patient he still having tenderness over that area and is still feeling ill.  Will consult IR as recommended by radiology to discuss further workup and disposition.  5:09 PM Spoke to interventional radiology who reviewed the images and case.  They do feel this is more emergent and he will need coiling and intervention tonight.  On reassessment blood pressure was over 200, will start Cleviprex and call ICU for management.  He will remain NPO.  Patient is amenable to this plan.  Will be admitted by critical care after going to IR for intervention for intrahepatic bleeding.   CRITICAL CARE Performed by: Canary Brim Ulani Degrasse Total critical care time: 35 minutes Critical care time was exclusive of separately billable procedures and treating other patients. Critical care was necessary to treat or prevent imminent or life-threatening deterioration. Critical care was time spent personally by me on the following activities: development of treatment plan with patient and/or surrogate as well as nursing, discussions with consultants, evaluation of patient's response to treatment, examination of patient, obtaining history from patient or surrogate, ordering and performing treatments and interventions, ordering and review of laboratory studies, ordering and review of radiographic studies, pulse oximetry and re-evaluation of patient's condition.   Clinical Impression: 1. Right upper quadrant abdominal pain     Disposition: Admit  This note  was prepared with assistance of Dragon voice recognition software. Occasional wrong-word or sound-a-like substitutions may have occurred due to the inherent limitations of voice recognition software.     Emerie Vanderkolk, Canary Brim, MD 02/10/23 0001

## 2023-02-09 NOTE — ED Triage Notes (Signed)
Pt here with acute onset R sided back pain now radiating to the front of his chest along with shob. Pt states last time he felt like this he had a pneumothorax. Pt visibly uncomfortable in triage.

## 2023-02-09 NOTE — Progress Notes (Addendum)
eLink Physician-Brief Progress Note Patient Name: ABDURRAHEEM AFIFI DOB: 01-06-1976 MRN: [id number]   Date of Service  02/09/2023  HPI/Events of Note  47 year old stage renal disease with severe onset right pleuritic/back pain with a history of pneumothorax but came in with an acute hepatic bleed.  He underwent IR guided coiling and has had severe pain, Dilaudid 1 mg every 3 hours is ineffective, Cleviprex is maxed out with persistent hypertension 188/99.  eICU Interventions  Increase dose of Dilaudid, the patient's mentation is appropriate currently.  Changed to Dilaudid 2 mg every 3 hours.   Add end-tidal CO2 monitoring.  Has a hydralazine allergy, will add on labetalol instead.   2328 -has pretty significant opiate tolerance.  Follow-up with Dilaudid push and increase dose are not currently controlling his pain.  Also has visible obstructive sleep apnea on camera.  Will attempt CPAP.  Unfortunately, concurrently he has severe itching for unclear reasons diffusely and he is pulling at his nasal cannula.  Will attempt one-time hydroxyzine.  For now, I have a low threshold to repeat a abdominal scan and switching to PCA if his pain is uncontrolled.  While off CPAP, would like to do end-tidal CO2 monitoring.  Intervention Category Intermediate Interventions: Pain - evaluation and management  Kadeen Sroka 02/09/2023, 10:37 PM

## 2023-02-09 NOTE — Progress Notes (Signed)
Around 2300 when patient started falling asleep, his oxygen desaturated down to the low 70s with periods of severe apnea. According to the patient, he has been trying to get a sleep study done but has been unable to. This RN made CCM E-link MD aware and put a nasal cannula on the patient at 3L

## 2023-02-09 NOTE — ED Provider Notes (Signed)
[place] EMERGENCY DEPARTMENT AT [facility] Provider Note   CSN: [id number] Arrival date & time: 02/09/23  4098     History  Chief Complaint  Patient presents with   Back Pain   Chest Pain   Shortness of Breath    Roy Randolph is a 47 y.o. male.  Patient is a 46 year old male with past medical history of ESRD on dialysis, hypertension and prior pneumothorax in his 63s presenting to the emergency department with right-sided chest pain.  The patient states that he woke up in the night with right-sided back pain that radiated into his chest.  He states that the pain felt similar to when he had his pneumothorax.  He reported some nausea but denies any vomiting.  He denies any fevers or chills.  He does report some shortness of breath.  He denies any diarrhea or constipation.  The history is provided by the patient.  Back Pain Associated symptoms: chest pain   Chest Pain Associated symptoms: back pain and shortness of breath   Shortness of Breath Associated symptoms: chest pain        Home Medications Prior to Admission medications   Medication Sig Start Date End Date Taking? Authorizing Provider  acetaminophen (TYLENOL) 325 MG tablet Take 2 tablets (650 mg total) by mouth every 6 (six) hours as needed for fever or mild pain. 01/02/22   Katsadouros, Vasilios, MD  amLODipine (NORVASC) 10 MG tablet Take 1 tablet (10 mg total) by mouth daily. 02/15/22   Ellison Carwin, MD  bumetanide (BUMEX) 2 MG tablet Take 2 mg by mouth 2 (two) times daily. Take 1 tablet (2MG ) by mouth twice daily 11/17/22   [provider]  calcium acetate (PHOSLO) 667 MG capsule Take 1 capsule (667 mg total) by mouth 3 (three) times daily with meals. 01/18/22   Dellia Cloud, MD  cloNIDine (CATAPRES) 0.3 MG tablet Take 1 tablet (0.3 mg total) by mouth 3 (three) times daily. 12/05/22   Jacalyn Lefevre, MD  Darbepoetin Alfa (ARANESP) 300 MCG/0.6ML SOSY injection Inject 0.6 mLs (300 mcg total)  into the vein every dialysis (Tu,Th,Sat). 01/02/22   Katsadouros, Vasilios, MD  labetalol (NORMODYNE) 200 MG tablet Take 2 tablets (400 mg total) by mouth 2 (two) times daily. 02/15/22   Ellison Carwin, MD  mirtazapine (REMERON) 7.5 MG tablet Take 1 tablet (7.5 mg total) by mouth at bedtime. Start after weaned off Zoloft. Patient not taking: Reported on 12/26/2022 12/12/22   Marcine Matar, MD  multivitamin (RENA-VIT) TABS tablet Take 1 tablet by mouth daily.    [provider]  NIFEdipine (ADALAT CC) 30 MG 24 hr tablet Take 30 mg by mouth 2 (two) times daily.  Take 1 tablet (30 MG) by mouth every 12 hours 09/24/22   [provider]  pantoprazole (PROTONIX) 40 MG tablet Take 1 tablet (40 mg total) by mouth daily. 02/15/22   Ellison Carwin, MD  sertraline (ZOLOFT) 50 MG tablet Take 1 tablet (50 mg total) by mouth daily. 02/15/22   Ellison Carwin, MD  Vitamin D, Ergocalciferol, (DRISDOL) 1.25 MG (50000 UNIT) CAPS capsule Take 1 capsule (50,000 Units total) by mouth once a week. 02/07/22   Penninger, Lillia Abed, PA      Allergies    Cucumber extract, Other, Tomato (diagnostic), Wild lettuce [wild lettuce extract (lactuca virosa)], and Hydralazine    Review of Systems   Review of Systems  Respiratory:  Positive for shortness of breath.   Cardiovascular:  Positive for chest  pain.  Musculoskeletal:  Positive for back pain.    Physical Exam Updated Vital Signs BP (!) 191/101   Pulse 79   Temp 97.8 F (36.6 C) (Oral)   Resp 13   Ht 5\' 9"  (1.753 m)   Wt 65.8 kg   SpO2 98%   BMI 21.41 kg/m  Physical Exam Vitals and nursing note reviewed.  Constitutional:      General: He is not in acute distress.    Comments: Uncomfortable appearing  HENT:     Head: Normocephalic and atraumatic.  Eyes:     Extraocular Movements: Extraocular movements intact.  Cardiovascular:     Rate and Rhythm: Normal rate and regular rhythm.     Heart sounds: Normal heart sounds.  Pulmonary:     Effort:  Pulmonary effort is normal.     Breath sounds: Normal breath sounds.  Chest:     Chest wall: No tenderness.  Abdominal:     Palpations: Abdomen is soft.     Tenderness: There is abdominal tenderness (RUQ tenderness to palpation wtih guarding, no rebound). There is guarding. There is no rebound.  Musculoskeletal:        General: Normal range of motion.     Cervical back: Normal range of motion and neck supple.  Skin:    General: Skin is warm and dry.  Neurological:     General: No focal deficit present.     Mental Status: He is alert and oriented to person, place, and time.  Psychiatric:        Mood and Affect: Mood normal.        Behavior: Behavior normal.     ED Results / Procedures / Treatments   Labs (all labs ordered are listed, but only abnormal results are displayed) Labs Reviewed  BASIC METABOLIC PANEL - Abnormal; Notable for the following components:      Result Value   Glucose, Bld 130 (*)    BUN 37 (*)    Creatinine, Ser 5.66 (*)    GFR, Estimated 12 (*)    All other components within normal limits  CBC - Abnormal; Notable for the following components:   RBC 3.87 (*)    Hemoglobin 11.3 (*)    HCT 35.9 (*)    RDW 17.8 (*)    All other components within normal limits  HEPATIC FUNCTION PANEL - Abnormal; Notable for the following components:   Total Protein 9.1 (*)    AST 46 (*)    Alkaline Phosphatase 172 (*)    Total Bilirubin 1.3 (*)    Bilirubin, Direct 0.5 (*)    All other components within normal limits  TROPONIN I (HIGH SENSITIVITY) - Abnormal; Notable for the following components:   Troponin I (High Sensitivity) 43 (*)    All other components within normal limits  TROPONIN I (HIGH SENSITIVITY) - Abnormal; Notable for the following components:   Troponin I (High Sensitivity) 41 (*)    All other components within normal limits  LIPASE, BLOOD    EKG EKG Interpretation  Date/Time:  Sunday Feb 09 2023 09:48:59 EDT Ventricular Rate:  77 PR  Interval:  134 QRS Duration: 106 QT Interval:  416 QTC Calculation: 470 R Axis:   48 Text Interpretation: Normal sinus rhythm Left ventricular hypertrophy with repolarization abnormality ( Sokolow-Lyon , Cornell product , Romhilt-Estes ) Abnormal ECG Interpretation limited secondary to artifact Confirmed by Elayne Snare ([id number]) on 02/09/2023 10:15:56 AM  Radiology US Abdomen Limited RUQ (LIVER/GB)  Result Date:  02/09/2023 CLINICAL DATA:  Right upper quadrant discomfort EXAM: ULTRASOUND ABDOMEN LIMITED RIGHT UPPER QUADRANT COMPARISON:  None Available. FINDINGS: Gallbladder: Mild gallbladder wall thickening measuring 4.2 mm. Mild pericholecystic fluid. No stones, sludge, or Murphy's sign. Common bile duct: Diameter: 2 mm Liver: Diffuse increased echogenicity. No focal mass. Portal vein is patent on color Doppler imaging with normal direction of blood flow towards the liver. Other: Mild ascites in this dialysis patient. The right kidney is echogenic. IMPRESSION: 1. Mild gallbladder wall thickening. Mild pericholecystic fluid is nonspecific in a patient with ascites. No stones, sludge, or Murphy's sign. 2. Diffuse increased echogenicity in the liver is nonspecific but often due to hepatic steatosis. 3. Mild ascites in this dialysis patient. 4. The right kidney is echogenic consistent with medical renal disease. Electronically Signed   By: Gerome Sam III M.D.   On: 02/09/2023 12:10   DG Chest Portable 1 View  Result Date: 02/09/2023 CLINICAL DATA:  Chest pain EXAM: PORTABLE CHEST 1 VIEW COMPARISON:  Feb 04, 2023 FINDINGS: Stable cardiomegaly. Hila and mediastinum are normal. No pneumothorax. Mild bibasilar opacities. IMPRESSION: Mild bibasilar opacities could represent early infiltrate. Developing asymmetric edema is a possibility. Recommend clinical correlation. Electronically Signed   By: Gerome Sam III M.D.   On: 02/09/2023 10:57    Procedures Procedures    Medications Ordered in  ED Medications  fentaNYL (SUBLIMAZE) injection 50 mcg (50 mcg Intravenous Given 02/09/23 1050)  ondansetron (ZOFRAN) injection 4 mg (4 mg Intravenous Given 02/09/23 1050)  HYDROmorphone (DILAUDID) injection 0.5 mg (0.5 mg Intravenous Given 02/09/23 1253)  iohexol (OMNIPAQUE) 350 MG/ML injection 75 mL (75 mLs Intravenous Contrast Given 02/09/23 1502)    ED Course/ Medical Decision Making/ A&P Clinical Course as of 02/09/23 1528  Sun Feb 09, 2023  1304 Patient's x-ray showed possible developing infiltrates versus edema though no signs of pneumothorax or obvious cause of his severe pain.  Right upper quadrant ultrasound showed no evidence of cholecystitis or cholelithiasis.  Patient is continuing to have significant pain and will have CT performed. [VK]  1331 Troponin flat. [VK]  1528 Patient signed out to Dr. Rush Landmark pending CT read. [VK]    Clinical Course User Index [VK] Rexford Maus, DO                             Medical Decision Making This patient presents to the ED with chief complaint(s) of RUQ/chest pain with pertinent past medical history of ESRD, HTN which further complicates the presenting complaint. The complaint involves an extensive differential diagnosis and also carries with it a high risk of complications and morbidity.    The differential diagnosis includes pneumonia, pneumothorax, pulmonary edema, pleural effusion, ACS, pancreatitis, hepatitis, cholelithiasis, cholecystitis, gastritis, GERD, considering dissection though pain is localized to the right without neurologic deficits, patient has no known PE risk factors making PE less likely  Additional history obtained: Additional history obtained from N/A Records reviewed Primary Care Documents  ED Course and Reassessment: On patient's arrival to the emergency department, he is uncomfortable appearing though had equal bilateral breath sounds and is hemodynamically stable making a tension pneumothorax unlikely and  will have x-ray performed.  EKG showed normal sinus rhythm without acute ischemic changes.  The patient also has significant right upper quadrant tenderness and will have right upper quadrant ultrasound as well as LFTs and lipase, troponin and pain control and will be closely reassessed.  Independent labs interpretation:  The  following labs were independently interpreted: Flat troponins, labs otherwise within normal range  Independent visualization of imaging: - I independently visualized the following imaging with scope of interpretation limited to determining acute life threatening conditions related to emergency care: Chest x-ray, right upper quadrant ultrasound, which revealed possible pulm edema, no evidence of cholelithiasis or cholecystitis on ultrasound     Amount and/or Complexity of Data Reviewed Labs: ordered. Radiology: ordered.  Risk Prescription drug management.          Final Clinical Impression(s) / ED Diagnoses Final diagnoses:  None    Rx / DC Orders ED Discharge Orders     None         Rexford Maus, DO 02/09/23 1528

## 2023-02-09 NOTE — Procedures (Signed)
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

## 2023-02-10 ENCOUNTER — Other Ambulatory Visit: Payer: Self-pay | Admitting: Radiology

## 2023-02-10 ENCOUNTER — Encounter (HOSPITAL_COMMUNITY): Payer: Self-pay | Admitting: Internal Medicine

## 2023-02-10 DIAGNOSIS — I161 Hypertensive emergency: Secondary | ICD-10-CM

## 2023-02-10 LAB — BASIC METABOLIC PANEL
Anion gap: 15 (ref 5–15)
BUN: 47 mg/dL — ABNORMAL HIGH (ref 6–20)
CO2: 21 mmol/L — ABNORMAL LOW (ref 22–32)
Calcium: 9.7 mg/dL (ref 8.9–10.3)
Chloride: 96 mmol/L — ABNORMAL LOW (ref 98–111)
Creatinine, Ser: 7.09 mg/dL — ABNORMAL HIGH (ref 0.61–1.24)
GFR, Estimated: 9 mL/min — ABNORMAL LOW (ref >60)
Glucose, Bld: 121 mg/dL — ABNORMAL HIGH (ref 70–99)
Potassium: 5.2 mmol/L — ABNORMAL HIGH (ref 3.5–5.1)
Sodium: 132 mmol/L — ABNORMAL LOW (ref 135–145)

## 2023-02-10 LAB — CBC
HCT: 33.2 % — ABNORMAL LOW (ref 39.0–52.0)
HCT: 33.8 % — ABNORMAL LOW (ref 39.0–52.0)
Hemoglobin: 10.9 g/dL — ABNORMAL LOW (ref 13.0–17.0)
Hemoglobin: 10.9 g/dL — ABNORMAL LOW (ref 13.0–17.0)
MCH: 29.5 pg (ref 26.0–34.0)
MCH: 29.5 pg (ref 26.0–34.0)
MCHC: 32.8 g/dL (ref 30.0–36.0)
MCHC: 32.2 g/dL (ref 30.0–36.0)
MCV: 90 fL (ref 80.0–100.0)
MCV: 91.4 fL (ref 80.0–100.0)
Platelets: 203 10*3/uL (ref 150–400)
Platelets: 183 10*3/uL (ref 150–400)
RBC: 3.69 MIL/uL — ABNORMAL LOW (ref 4.22–5.81)
RBC: 3.7 MIL/uL — ABNORMAL LOW (ref 4.22–5.81)
RDW: 17.6 % — ABNORMAL HIGH (ref 11.5–15.5)
RDW: 17.4 % — ABNORMAL HIGH (ref 11.5–15.5)
WBC: 11 10*3/uL — ABNORMAL HIGH (ref 4.0–10.5)
WBC: 19.7 10*3/uL — ABNORMAL HIGH (ref 4.0–10.5)
nRBC: 0 % (ref 0.0–0.2)
nRBC: 0 % (ref 0.0–0.2)

## 2023-02-10 LAB — MAGNESIUM: Magnesium: 2.2 mg/dL (ref 1.7–2.4)

## 2023-02-10 LAB — HEPATITIS B SURFACE ANTIGEN: Hepatitis B Surface Ag: NONREACTIVE

## 2023-02-10 LAB — PHOSPHORUS: Phosphorus: 7.2 mg/dL — ABNORMAL HIGH (ref 2.5–4.6)

## 2023-02-10 MED ORDER — CHLORHEXIDINE GLUCONATE CLOTH 2 % EX PADS: 6.0000 | MEDICATED_PAD | Freq: Every day | CUTANEOUS | Status: DC

## 2023-02-10 MED ORDER — LABETALOL HCL 5 MG/ML IV SOLN
10.0000 mg | Freq: Once | INTRAVENOUS | Status: DC
  Filled 2023-02-10: qty 4

## 2023-02-10 MED ORDER — HYDROXYZINE HCL 25 MG PO TABS
25.0000 mg | ORAL_TABLET | Freq: Three times a day (TID) | ORAL | Status: DC | PRN
  Administered 2023-02-10: 25 mg via ORAL
  Filled 2023-02-10: qty 1

## 2023-02-10 NOTE — H&P (Signed)
   NAME:  Roy Randolph, MRN:  [id number], DOB:  [birth year], LOS: 1 ADMISSION DATE:  02/09/2023, CONSULTATION DATE:  02/09/23 REFERRING MD:  Tegeler, CHIEF COMPLAINT:  back pain   History of Present Illness:  47 year old man w/ hx of ESRD p/w severe onset R back pain pleuritic in nature.  Has hx of PTX in past so thought it might be this.  Came in and imaging reveals acute hepatic bleed.  IR mobilizing to coil.   Too nauseous to tolerate his 5 home BP meds.  Started on cardene and PCCM to admit.  Denies trauma.  Pertinent  Medical History  ESRD HTN Prior PRES Severe sleep apnea with BiPAP non-adherence   Significant Hospital Events: Including procedures, antibiotic start and stop dates in addition to other pertinent events   5/12 admit 5/12 IR coil embolization of hepatic artery pseudoaneurysm  Interim History / Subjective:  OSA overnight. Nausea has improved today.   Objective   Blood pressure (!) 150/91, pulse 83, temperature 97.9 F (36.6 C), temperature source Oral, resp. rate 17, height 5\' 9"  (1.753 m), weight 65.8 kg, SpO2 90 %.        Intake/Output Summary (Last 24 hours) at 02/10/2023 0743 Last data filed at 02/10/2023 0700 Gross per 24 hour  Intake 561.07 ml  Output 200 ml  Net 361.07 ml   Filed Weights   02/09/23 1056  Weight: 65.8 kg    Examination: General: no distress HENT: MMM, trachea midline Lungs: Clear, no  wheezing, but clear obstruction when sleeping. Cardiovascular: RRR, ext warm Abdomen: TTP RUQ Extremities: Warm, no clubbing Neuro: moves ext to command Skin: No rashes  Patient Lines/Drains/Airways Status     Active Line/Drains/Airways     Name Placement date Placement time Site Days   Peripheral IV 02/09/23 20 G Anterior;Right Forearm 02/09/23  1051  Forearm  less than 1   Peripheral IV 02/09/23 20 G Right Antecubital 02/09/23  1716  Antecubital  less than 1   Fistula / Graft Left Upper arm Arteriovenous fistula 01/01/22  1226  Upper arm   404   Hemodialysis Catheter Right Internal jugular Double lumen Permanent (Tunneled) 12/27/21  0917  Internal jugular  409            Ancillary tests personally reviewed:  Creatinine 7.09 Hb 10.9 (stable)  Assessment & Plan:  Acute spontaneous hepatic hematoma ESRD on HD Hypertensive emergency  - Cleviprex to SBP < 160, resume orals and wean off IV medications - Pain/nausea control as ordered - Progressive ambulation. May be ready for discharge in next 24 h  - Appreciate IR help with angio-coiling - Stable HB for discharge - Reiterate importance of CPAP for OSA.    Best Practice (right click and "Reselect all SmartList Selections" daily)   Diet/type: Regular DVT prophylaxis: SCD GI prophylaxis: N/A Lines: N/A Foley:  N/A Code Status:  full code Last date of multidisciplinary goals of care discussion [Updated patient and sister]  Lynnell Catalan, MD [facility] ICU Physician [facility]  Pager: [phone] Or Epic Secure Chat After hours: [phone].  02/10/2023, 7:46 AM

## 2023-02-10 NOTE — Progress Notes (Signed)
Pt placed on CPAP by RT. Pt tolerating well, Vitals table, RN at bedside, RT will monitor as needed.     02/10/23 0957  Therapy Vitals  Pulse Rate 92  Resp 17  MEWS Score/Color  MEWS Score 0  MEWS Score Color Green  Oxygen Therapy/Pulse Ox  O2 Device (S)  CPAP  O2 Therapy Oxygen  O2 Flow Rate (L/min) 5 L/min  SpO2 95 %

## 2023-02-10 NOTE — Progress Notes (Signed)
Patient kept taking off CPAP mask after RT placed it on him. Oxygen saturation keeps dropping into 60-70 range. At last attempt to place mask back on, patient refused. MD made aware with no new orders.

## 2023-02-10 NOTE — Progress Notes (Addendum)
Referring Physician(s): Dr. 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 Vella Redhead on 5.12.24.  Subjective:  Patient states that he has RUQ pain that he describes as "sore". When asked to describe the pain he says it feels like when you are running and  He states that he does not want to be in the hospital anymore and would like to go home.   Allergies: Cucumber extract, Other, Tomato (diagnostic), Wild lettuce [wild lettuce extract (lactuca virosa)], and Hydralazine  Medications: Prior to Admission medications   Medication Sig Start Date End Date Taking? Authorizing Provider  acetaminophen (TYLENOL) 325 MG tablet Take 2 tablets (650 mg total) by mouth every 6 (six) hours as needed for fever or mild pain. 01/02/22  Yes Katsadouros, Vasilios, MD  amLODipine (NORVASC) 10 MG tablet Take 1 tablet (10 mg total) by mouth daily. 02/15/22  Yes Ellison Carwin, MD  cloNIDine (CATAPRES) 0.3 MG tablet Take 1 tablet (0.3 mg total) by mouth 3 (three) times daily. 12/05/22  Yes Jacalyn Lefevre, MD  labetalol (NORMODYNE) 200 MG tablet Take 2 tablets (400 mg total) by mouth 2 (two) times daily. 02/15/22  Yes Ellison Carwin, MD  mirtazapine (REMERON) 7.5 MG tablet Take 1 tablet (7.5 mg total) by mouth at bedtime. Start after weaned off Zoloft. 12/12/22  Yes Marcine Matar, MD  multivitamin (RENA-VIT) TABS tablet Take 1 tablet by mouth daily.   Yes [provider]  pantoprazole (PROTONIX) 40 MG tablet Take 1 tablet (40 mg total) by mouth daily. 02/15/22  Yes Ellison Carwin, MD  torsemide (DEMADEX) 100 MG tablet Take 100 mg by mouth daily. Take after dialysis on dialysis days   Yes [provider]  bumetanide (BUMEX) 2 MG tablet Take 2 mg by mouth 2 (two) times daily.  Take 1 tablet (2MG ) by mouth twice daily Patient not taking: Reported on 02/09/2023 11/17/22   [provider]  calcium acetate (PHOSLO) 667 MG capsule Take 1 capsule (667 mg total) by mouth 3 (three) times daily with meals. Patient not taking: Reported on 02/09/2023 01/18/22   Dellia Cloud, MD  Darbepoetin Alfa (ARANESP) 300 MCG/0.6ML SOSY injection Inject 0.6 mLs (300 mcg total) into the vein every dialysis (Tu,Th,Sat). 01/02/22   Katsadouros, Vasilios, MD  NIFEdipine (ADALAT CC) 30 MG 24 hr tablet Take 30 mg by mouth 2 (two) times daily.  Take 1 tablet (30 MG) by mouth every 12 hours Patient not taking: Reported on 02/09/2023 09/24/22   [provider]  sertraline (ZOLOFT) 50 MG tablet Take 1 tablet (50 mg total) by mouth daily. Patient not taking: Reported on 02/09/2023 02/15/22   Ellison Carwin, MD  Vitamin D, Ergocalciferol, (DRISDOL) 1.25 MG (50000 UNIT) CAPS capsule Take 1 capsule (50,000 Units total) by mouth once a week. Patient not taking: Reported on 02/09/2023 02/07/22   Penninger, Fletcher, PA     Vital Signs: BP (!) 161/86   Pulse 88   Temp (!) 101 F (38.3 C) (Oral)   Resp 15   Ht 5\' 9"  (1.753 m)   Wt 145 lb (65.8 kg)   SpO2 95%   BMI 21.41 kg/m   Physical Exam Vitals and nursing note reviewed.  Constitutional:      Appearance: He is well-developed.  HENT:     Head: Normocephalic.  Cardiovascular:     Comments: Right  groin access site is soft with no active bleeding and no appreciable pseudoaneurysm. Dressing is C/D/I   Pulmonary:     Effort: Pulmonary effort is normal.  Musculoskeletal:        General: Normal range of motion.     Cervical back: Normal range of motion.  Skin:    General: Skin is warm and dry.  Neurological:     Mental Status: He is alert and oriented to person, place, and time.     Imaging: IR EMBO ART  VEN HEMORR LYMPH EXTRAV  INC GUIDE ROADMAPPING  Result Date: 02/09/2023 INDICATION: 47 year old male with malignant  hypertension and spontaneous intrahepatic arterial pseudoaneurysm. He presents for emergent angiogram and embolization. EXAM: IR EMBO ART VEN HEMORR LYMPH EXTRAV INC GUIDE ROADMAPPING; SELECTIVE VISCERAL ARTERIOGRAPHY; IR ULTRASOUND GUIDANCE VASC ACCESS RIGHT MEDICATIONS: None. ANESTHESIA/SEDATION: Moderate (conscious) sedation was employed during this procedure. A total of Versed 1 mg and Fentanyl 25 mcg was administered intravenously. Moderate Sedation Time: 83 minutes. The patient's level of consciousness and vital signs were monitored continuously by radiology nursing throughout the procedure under my direct supervision. CONTRAST:  70 mL Isovue 300 FLUOROSCOPY: Radiation Exposure Index (as provided by the fluoroscopic device): 1,099 mGy Kerma COMPLICATIONS: None immediate. PROCEDURE: Informed consent was obtained from the patient following explanation of the procedure, risks, benefits and alternatives. The patient understands, agrees and consents for the procedure. All questions were addressed. A time out was performed prior to the initiation of the procedure. Maximal barrier sterile technique utilized including caps, mask, sterile gowns, sterile gloves, large sterile drape, hand hygiene, and Betadine prep. The right common femoral artery was interrogated with ultrasound and found to be widely patent. An image was obtained and stored for the medical record. Local anesthesia was attained by infiltration with 1% lidocaine. A small dermatotomy was made. Under real-time sonographic guidance, the vessel was punctured with a 21 gauge micropuncture needle. Using standard technique, the initial micro needle was exchanged over a 0.018 micro wire for a transitional 4 [place] micro sheath. The micro sheath was then exchanged over a 0.035 wire for a 5 French vascular sheath. A Sos Omni selective catheter was advanced over a Bentson wire and arteriography was performed. There is inadvertent catheterization of the right  inferior phrenic artery with arteriogram. No evidence of abnormality. The celiac axis was catheterized and arteriography was performed. Unfortunately, the vessel is very robust and extremely down sloping. The Sos catheter points at a prominent left gastric artery. Purchase is not particularly strong. A longer reverse curve catheter is necessary. Therefore, the Sos catheter was exchanged for a Simmons catheter which was advanced over the aortic bifurcation. The Simmons catheter was used to catheterize the common hepatic artery. A hepatic arteriogram is performed. Hypertrophic and highly tortuous hepatic arteries. Large pseudoaneurysm arising from the segment 3 left hepatic artery measures grossly 5.3 x 3.3 cm. There is intravasation into an drainage through the left hepatic vein. A Cook can tot a microcatheter was advanced over a Fathom wire and used to select the left hepatic artery. Arteriography was performed. The segment 2 hepatic artery is visualized. The segment 3 hepatic artery bifurcates into a superior and inferior division. The pseudoaneurysm arises from the inferior division of the segment 3 artery. The microcatheter was further advanced into the inferior division. Arteriography again was again performed confirming the location of the arterial injury and pseudoaneurysm. The vessel measures approximately 2.7 mm. First, embolization was attempted using  a Medtronic microvascular plug. Unfortunately, the plug could not be advanced due to the high tortuosity of the vessels without backing the catheter out. Therefore, the micro vascular plug was discarded. An initial attempt was made to embolize using a 4 x 15 mm Ruby low-profile detachable coil. However, the coil would not form within the artery without prolapsing into the large pseudoaneurysm sac. Therefore, therefore, this coil was recovered and set aside on the back table. Utilizing an exchange length 0.014 wire, the cantata microcatheter was exchanged for a  lantern microcatheter. A 3 mm pod detachable coil was successfully deployed in the inferior division of the segment 3 artery forming the initial coil pack. Subsequent coil embolization was performed utilizing the 4 x 15 mm Ruby low-profile detachable coil, a 30 cm low-profile packing coil and a 30 cm standard packing coil. This resulted in complete embolization of the inferior and superior divisions of the segment 3 hepatic artery. Follow-up arteriography demonstrates no further opacification of the pseudoaneurysm. The catheters were removed. Hemostasis was attained with the assistance of a Celt arterial closure device. IMPRESSION: 1. Large arterial pseudoaneurysm arising from the inferior division of the segment 3 hepatic artery. There is associated intravasation through the hepatic parenchyma resulting in arteriovenous shunting into the left hepatic vein. 2. Successful coil embolization of the segment 3 hepatic artery with no further flow into the pseudoaneurysm. Electronically Signed   By: Malachy Moan M.D.   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 He presents for emergent angiogram and embolization. EXAM: IR EMBO ART VEN HEMORR LYMPH EXTRAV INC GUIDE ROADMAPPING; SELECTIVE VISCERAL ARTERIOGRAPHY; IR ULTRASOUND GUIDANCE VASC ACCESS RIGHT MEDICATIONS: None. ANESTHESIA/SEDATION: Moderate (conscious) sedation was employed during this procedure. A total of Versed 1 mg and Fentanyl 25 mcg was administered intravenously. Moderate Sedation Time: 83 minutes. The patient's level of consciousness and vital signs were monitored continuously by radiology nursing throughout the procedure under my direct supervision. CONTRAST:  70 mL Isovue 300 FLUOROSCOPY: Radiation Exposure Index (as provided by the fluoroscopic device): 1,099 mGy Kerma COMPLICATIONS: None immediate.  PROCEDURE: Informed consent was obtained from the patient following explanation of the procedure, risks, benefits and alternatives. The patient understands, agrees and consents for the procedure. All questions were addressed. A time out was performed prior to the initiation of the procedure. Maximal barrier sterile technique utilized including caps, mask, sterile gowns, sterile gloves, large sterile drape, hand hygiene, and Betadine prep. The right common femoral artery was interrogated with ultrasound and found to be widely patent. An image was obtained and stored for the medical record. Local anesthesia was attained by infiltration with 1% lidocaine. A small dermatotomy was made. Under real-time sonographic guidance, the vessel was punctured with a 21 gauge micropuncture needle. Using standard technique, the initial micro needle was exchanged over a 0.018 micro wire for a transitional 4 [place] micro sheath. The micro sheath was then exchanged over a 0.035 wire for a 5 French vascular sheath. A Sos Omni selective catheter was advanced over a Bentson wire and arteriography was performed. There is inadvertent catheterization of the right inferior phrenic artery with arteriogram. No evidence of abnormality. The celiac axis was catheterized and arteriography was performed. Unfortunately, the vessel is very robust and extremely down sloping. The Sos catheter points at a prominent left gastric artery. Purchase is not particularly strong. A longer reverse curve catheter is necessary. Therefore, the Sos  catheter was exchanged for a Simmons catheter which was advanced over the aortic bifurcation. The Simmons catheter was used to catheterize the common hepatic artery. A hepatic arteriogram is performed. Hypertrophic and highly tortuous hepatic arteries. Large pseudoaneurysm arising from the segment 3 left hepatic artery measures grossly 5.3 x 3.3 cm. There is intravasation into an drainage through the left hepatic vein. A  Cook can tot a microcatheter was advanced over a Fathom wire and used to select the left hepatic artery. Arteriography was performed. The segment 2 hepatic artery is visualized. The segment 3 hepatic artery bifurcates into a superior and inferior division. The pseudoaneurysm arises from the inferior division of the segment 3 artery. The microcatheter was further advanced into the inferior division. Arteriography again was again performed confirming the location of the arterial injury and pseudoaneurysm. The vessel measures approximately 2.7 mm. First, embolization was attempted using a Medtronic microvascular plug. Unfortunately, the plug could not be advanced due to the high tortuosity of the vessels without backing the catheter out. Therefore, the micro vascular plug was discarded. An initial attempt was made to embolize using a 4 x 15 mm Ruby low-profile detachable coil. However, the coil would not form within the artery without prolapsing into the large pseudoaneurysm sac. Therefore, therefore, this coil was recovered and set aside on the back table. Utilizing an exchange length 0.014 wire, the cantata microcatheter was exchanged for a lantern microcatheter. A 3 mm pod detachable coil was successfully deployed in the inferior division of the segment 3 artery forming the initial coil pack. Subsequent coil embolization was performed utilizing the 4 x 15 mm Ruby low-profile detachable coil, a 30 cm low-profile packing coil and a 30 cm standard packing coil. This resulted in complete embolization of the inferior and superior divisions of the segment 3 hepatic artery. Follow-up arteriography demonstrates no further opacification of the pseudoaneurysm. The catheters were removed. Hemostasis was attained with the assistance of a Celt arterial closure device. IMPRESSION: 1. Large arterial pseudoaneurysm arising from the inferior division of the segment 3 hepatic artery. There is associated intravasation through the  hepatic parenchyma resulting in arteriovenous shunting into the left hepatic vein. 2. Successful coil embolization of the segment 3 hepatic artery with no further flow into the pseudoaneurysm. Electronically Signed   By: Malachy Moan M.D.   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 He presents for emergent angiogram and embolization. EXAM: IR EMBO ART VEN HEMORR LYMPH EXTRAV INC GUIDE ROADMAPPING; SELECTIVE VISCERAL ARTERIOGRAPHY; IR ULTRASOUND GUIDANCE VASC ACCESS RIGHT MEDICATIONS: None. ANESTHESIA/SEDATION: Moderate (conscious) sedation was employed during this procedure. A total of Versed 1 mg and Fentanyl 25 mcg was administered intravenously. Moderate Sedation Time: 83 minutes. The patient's level of consciousness and vital signs were monitored continuously by radiology nursing throughout the procedure under my direct supervision. CONTRAST:  70 mL Isovue 300 FLUOROSCOPY: Radiation Exposure Index (as provided by the fluoroscopic device): 1,099 mGy Kerma COMPLICATIONS: None immediate. PROCEDURE: Informed consent was obtained from the patient following explanation of the procedure, risks, benefits and alternatives. The patient understands, agrees and consents for the procedure. All questions were addressed. A time out was performed prior to the initiation of the procedure. Maximal barrier sterile technique utilized including caps, mask, sterile gowns, sterile gloves, large sterile drape, hand hygiene, and Betadine prep. The right common femoral artery was interrogated with ultrasound and found to be widely patent. An  image was obtained and stored for the medical record. Local anesthesia was attained by infiltration with 1% lidocaine. A small dermatotomy was made. Under real-time sonographic guidance, the vessel was punctured with a 21 gauge micropuncture  needle. Using standard technique, the initial micro needle was exchanged over a 0.018 micro wire for a transitional 4 [place] micro sheath. The micro sheath was then exchanged over a 0.035 wire for a 5 French vascular sheath. A Sos Omni selective catheter was advanced over a Bentson wire and arteriography was performed. There is inadvertent catheterization of the right inferior phrenic artery with arteriogram. No evidence of abnormality. The celiac axis was catheterized and arteriography was performed. Unfortunately, the vessel is very robust and extremely down sloping. The Sos catheter points at a prominent left gastric artery. [place] is not particularly strong. A longer reverse curve catheter is necessary. Therefore, the Sos catheter was exchanged for a Simmons catheter which was advanced over the aortic bifurcation. The Simmons catheter was used to catheterize the common hepatic artery. A hepatic arteriogram is performed. Hypertrophic and highly tortuous hepatic arteries. Large pseudoaneurysm arising from the segment 3 left hepatic artery measures grossly 5.3 x 3.3 cm. There is intravasation into an drainage through the left hepatic vein. A Cook can tot a microcatheter was advanced over a Fathom wire and used to select the left hepatic artery. Arteriography was performed. The segment 2 hepatic artery is visualized. The segment 3 hepatic artery bifurcates into a superior and inferior division. The pseudoaneurysm arises from the inferior division of the segment 3 artery. The microcatheter was further advanced into the inferior division. Arteriography again was again performed confirming the location of the arterial injury and pseudoaneurysm. The vessel measures approximately 2.7 mm. First, embolization was attempted using a Medtronic microvascular plug. Unfortunately, the plug could not be advanced due to the high tortuosity of the vessels without backing the catheter out. Therefore, the micro vascular plug was  discarded. An initial attempt was made to embolize using a 4 x 15 mm Ruby low-profile detachable coil. However, the coil would not form within the artery without prolapsing into the large pseudoaneurysm sac. Therefore, therefore, this coil was recovered and set aside on the back table. Utilizing an exchange length 0.014 wire, the cantata microcatheter was exchanged for a lantern microcatheter. A 3 mm pod detachable coil was successfully deployed in the inferior division of the segment 3 artery forming the initial coil pack. Subsequent coil embolization was performed utilizing the 4 x 15 mm Ruby low-profile detachable coil, a 30 cm low-profile packing coil and a 30 cm standard packing coil. This resulted in complete embolization of the inferior and superior divisions of the segment 3 hepatic artery. Follow-up arteriography demonstrates no further opacification of the pseudoaneurysm. The catheters were removed. Hemostasis was attained with the assistance of a Celt arterial closure device. IMPRESSION: 1. Large arterial pseudoaneurysm arising from the inferior division of the segment 3 hepatic artery. There is associated intravasation through the hepatic parenchyma resulting in arteriovenous shunting into the left hepatic vein. 2. Successful coil embolization of the segment 3 hepatic artery with no further flow into the pseudoaneurysm. Electronically Signed   By: Malachy Moan M.D.   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 He presents for emergent angiogram and embolization. EXAM: IR EMBO ART VEN HEMORR LYMPH EXTRAV INC GUIDE ROADMAPPING; SELECTIVE VISCERAL ARTERIOGRAPHY; IR ULTRASOUND GUIDANCE VASC ACCESS RIGHT MEDICATIONS:  None. ANESTHESIA/SEDATION: Moderate (conscious) sedation was employed during this procedure. A total of Versed 1 mg and Fentanyl 25 mcg was  administered intravenously. Moderate Sedation Time: 83 minutes. The patient's level of consciousness and vital signs were monitored continuously by radiology nursing throughout the procedure under my direct supervision. CONTRAST:  70 mL Isovue 300 FLUOROSCOPY: Radiation Exposure Index (as provided by the fluoroscopic device): 1,099 mGy Kerma COMPLICATIONS: None immediate. PROCEDURE: Informed consent was obtained from the patient following explanation of the procedure, risks, benefits and alternatives. The patient understands, agrees and consents for the procedure. All questions were addressed. A time out was performed prior to the initiation of the procedure. Maximal barrier sterile technique utilized including caps, mask, sterile gowns, sterile gloves, large sterile drape, hand hygiene, and Betadine prep. The right common femoral artery was interrogated with ultrasound and found to be widely patent. An image was obtained and stored for the medical record. Local anesthesia was attained by infiltration with 1% lidocaine. A small dermatotomy was made. Under real-time sonographic guidance, the vessel was punctured with a 21 gauge micropuncture needle. Using standard technique, the initial micro needle was exchanged over a 0.018 micro wire for a transitional 4 [place] micro sheath. The micro sheath was then exchanged over a 0.035 wire for a 5 French vascular sheath. A Sos Omni selective catheter was advanced over a Bentson wire and arteriography was performed. There is inadvertent catheterization of the right inferior phrenic artery with arteriogram. No evidence of abnormality. The celiac axis was catheterized and arteriography was performed. Unfortunately, the vessel is very robust and extremely down sloping. The Sos catheter points at a prominent left gastric artery. [place] is not particularly strong. A longer reverse curve catheter is necessary. Therefore, the Sos catheter was exchanged for a Simmons catheter which  was advanced over the aortic bifurcation. The Simmons catheter was used to catheterize the common hepatic artery. A hepatic arteriogram is performed. Hypertrophic and highly tortuous hepatic arteries. Large pseudoaneurysm arising from the segment 3 left hepatic artery measures grossly 5.3 x 3.3 cm. There is intravasation into an drainage through the left hepatic vein. A Cook can tot a microcatheter was advanced over a Fathom wire and used to select the left hepatic artery. Arteriography was performed. The segment 2 hepatic artery is visualized. The segment 3 hepatic artery bifurcates into a superior and inferior division. The pseudoaneurysm arises from the inferior division of the segment 3 artery. The microcatheter was further advanced into the inferior division. Arteriography again was again performed confirming the location of the arterial injury and pseudoaneurysm. The vessel measures approximately 2.7 mm. First, embolization was attempted using a Medtronic microvascular plug. Unfortunately, the plug could not be advanced due to the high tortuosity of the vessels without backing the catheter out. Therefore, the micro vascular plug was discarded. An initial attempt was made to embolize using a 4 x 15 mm Ruby low-profile detachable coil. However, the coil would not form within the artery without prolapsing into the large pseudoaneurysm sac. Therefore, therefore, this coil was recovered and set aside on the back table. Utilizing an exchange length 0.014 wire, the cantata microcatheter was exchanged for a lantern microcatheter. A 3 mm pod detachable coil was successfully deployed in the inferior division of the segment 3 artery forming the initial coil pack. Subsequent coil embolization was performed utilizing the 4 x 15 mm Ruby low-profile detachable coil, a 30 cm low-profile packing coil and a 30 cm standard packing coil. This resulted in complete embolization of  the inferior and superior divisions of the segment  3 hepatic artery. Follow-up arteriography demonstrates no further opacification of the pseudoaneurysm. The catheters were removed. Hemostasis was attained with the assistance of a Celt arterial closure device. IMPRESSION: 1. Large arterial pseudoaneurysm arising from the inferior division of the segment 3 hepatic artery. There is associated intravasation through the hepatic parenchyma resulting in arteriovenous shunting into the left hepatic vein. 2. Successful coil embolization of the segment 3 hepatic artery with no further flow into the pseudoaneurysm. Electronically Signed   By: Malachy Moan M.D.   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 He presents for emergent angiogram and embolization. EXAM: IR EMBO ART VEN HEMORR LYMPH EXTRAV INC GUIDE ROADMAPPING; SELECTIVE VISCERAL ARTERIOGRAPHY; IR ULTRASOUND GUIDANCE VASC ACCESS RIGHT MEDICATIONS: None. ANESTHESIA/SEDATION: Moderate (conscious) sedation was employed during this procedure. A total of Versed 1 mg and Fentanyl 25 mcg was administered intravenously. Moderate Sedation Time: 83 minutes. The patient's level of consciousness and vital signs were monitored continuously by radiology nursing throughout the procedure under my direct supervision. CONTRAST:  70 mL Isovue 300 FLUOROSCOPY: Radiation Exposure Index (as provided by the fluoroscopic device): 1,099 mGy Kerma COMPLICATIONS: None immediate. PROCEDURE: Informed consent was obtained from the patient following explanation of the procedure, risks, benefits and alternatives. The patient understands, agrees and consents for the procedure. All questions were addressed. A time out was performed prior to the initiation of the procedure. Maximal barrier sterile technique utilized including caps, mask, sterile gowns, sterile gloves, large sterile drape, hand  hygiene, and Betadine prep. The right common femoral artery was interrogated with ultrasound and found to be widely patent. An image was obtained and stored for the medical record. Local anesthesia was attained by infiltration with 1% lidocaine. A small dermatotomy was made. Under real-time sonographic guidance, the vessel was punctured with a 21 gauge micropuncture needle. Using standard technique, the initial micro needle was exchanged over a 0.018 micro wire for a transitional 4 [place] micro sheath. The micro sheath was then exchanged over a 0.035 wire for a 5 French vascular sheath. A Sos Omni selective catheter was advanced over a Bentson wire and arteriography was performed. There is inadvertent catheterization of the right inferior phrenic artery with arteriogram. No evidence of abnormality. The celiac axis was catheterized and arteriography was performed. Unfortunately, the vessel is very robust and extremely down sloping. The Sos catheter points at a prominent left gastric artery. Purchase is not particularly strong. A longer reverse curve catheter is necessary. Therefore, the Sos catheter was exchanged for a Simmons catheter which was advanced over the aortic bifurcation. The Simmons catheter was used to catheterize the common hepatic artery. A hepatic arteriogram is performed. Hypertrophic and highly tortuous hepatic arteries. Large pseudoaneurysm arising from the segment 3 left hepatic artery measures grossly 5.3 x 3.3 cm. There is intravasation into an drainage through the left hepatic vein. A Cook can tot a microcatheter was advanced over a Fathom wire and used to select the left hepatic artery. Arteriography was performed. The segment 2 hepatic artery is visualized. The segment 3 hepatic artery bifurcates into a superior and inferior division. The pseudoaneurysm arises from the inferior division of the segment 3 artery. The microcatheter was further advanced into the inferior division. Arteriography  again was again performed confirming the location of the arterial injury and pseudoaneurysm. The vessel measures approximately 2.7 mm. First, embolization was attempted  using a Medtronic microvascular plug. Unfortunately, the plug could not be advanced due to the high tortuosity of the vessels without backing the catheter out. Therefore, the micro vascular plug was discarded. An initial attempt was made to embolize using a 4 x 15 mm Ruby low-profile detachable coil. However, the coil would not form within the artery without prolapsing into the large pseudoaneurysm sac. Therefore, therefore, this coil was recovered and set aside on the back table. Utilizing an exchange length 0.014 wire, the cantata microcatheter was exchanged for a lantern microcatheter. A 3 mm pod detachable coil was successfully deployed in the inferior division of the segment 3 artery forming the initial coil pack. Subsequent coil embolization was performed utilizing the 4 x 15 mm Ruby low-profile detachable coil, a 30 cm low-profile packing coil and a 30 cm standard packing coil. This resulted in complete embolization of the inferior and superior divisions of the segment 3 hepatic artery. Follow-up arteriography demonstrates no further opacification of the pseudoaneurysm. The catheters were removed. Hemostasis was attained with the assistance of a Celt arterial closure device. IMPRESSION: 1. Large arterial pseudoaneurysm arising from the inferior division of the segment 3 hepatic artery. There is associated intravasation through the hepatic parenchyma resulting in arteriovenous shunting into the left hepatic vein. 2. Successful coil embolization of the segment 3 hepatic artery with no further flow into the pseudoaneurysm. Electronically Signed   By: Malachy Moan M.D.   On: 02/09/2023 21:32   CT Angio Chest/Abd/Pel for Dissection W and/or Wo Contrast  Result Date: 02/09/2023 CLINICAL DATA:  Acute  aortic syndrome suspected. EXAM: CT  ANGIOGRAPHY CHEST, ABDOMEN AND PELVIS TECHNIQUE: Non-contrast CT of the chest was initially obtained. Multidetector CT imaging through the chest, abdomen and pelvis was performed using the standard protocol during bolus administration of intravenous contrast. Multiplanar reconstructed images and MIPs were obtained and reviewed to evaluate the vascular anatomy. RADIATION DOSE REDUCTION: This exam was performed according to the departmental dose-optimization program which includes automated exposure control, adjustment of the mA and/or kV according to patient size and/or use of iterative reconstruction technique. CONTRAST:  75mL OMNIPAQUE IOHEXOL 350 MG/ML SOLN COMPARISON:  MRI abdomen 12/03/2021, noncontrast CT 12/05/2022, ultrasound 02/09/2023 FINDINGS: CTA CHEST FINDINGS Cardiovascular: Noncontrast series demonstrates no intramural hematoma thoracic aorta. Contrast series demonstrates no aortic dissection or aneurysm. Mediastinum/Nodes: No axillary or supraclavicular adenopathy. No mediastinal or hilar adenopathy. No pericardial fluid. Esophagus normal. Lungs/Pleura: No suspicious pulmonary nodules. Normal pleural. Airways normal. Musculoskeletal: No aggressive osseous lesion. Review of the MIP images confirms the above findings. CTA ABDOMEN AND PELVIS FINDINGS VASCULAR Aorta: Normal caliber aorta without aneurysm, dissection, vasculitis or significant stenosis. Celiac: Patent without evidence of aneurysm, dissection, vasculitis or significant stenosis. SMA: Patent without evidence of aneurysm, dissection, vasculitis or significant stenosis. Renals: Both renal arteries are patent without evidence of aneurysm, dissection, vasculitis, fibromuscular dysplasia or significant stenosis. IMA: Patent without evidence of aneurysm, dissection, vasculitis or significant stenosis. Inflow: Patent without evidence of aneurysm, dissection, vasculitis or significant stenosis. Veins: No obvious venous abnormality within the  limitations of this arterial phase study. Review of the MIP images confirms the above findings. NON-VASCULAR Hepatobiliary: There is an early enhancing vascular lesion within the lateral segment LEFT hepatic lobe (segment 2 and segment 3). Rounded vascular structure measuring 3.7 by 3.3 cm in total dimension (image 179/7). 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 No  evidence of fistula on noncontrast MRI December 03, 2021 or noncontrast CT 12/05/2022. Pancreas: Pancreas is normal. No ductal dilatation. No pancreatic inflammation. Spleen: Normal spleen Adrenals/urinary tract: Adrenal glands and kidneys are normal. The ureters and bladder normal. Stomach/Bowel: Stomach, small bowel, appendix, and cecum are normal. The colon and rectosigmoid colon are normal. Vascular/Lymphatic: No lymphadenopathy Reproductive: Prostate unremarkable Other: No free fluid. Musculoskeletal: No aggressive osseous lesion. Review of the MIP images confirms the above findings. IMPRESSION: CHEST IMPRESSION: 1. No evidence of aortic dissection or aneurysm. 2. No acute pulmonary parenchymal findings. PELVIS IMPRESSION: 1. No evidence of aortic dissection or aneurysm. 2. No acute findings in the abdomen pelvis. 3. 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 Recommend non emergent interventional radiology consultation. Dedicated ultrasound evaluation the lateral segment LEFT hepatic lobe may be valuable. Lesion appears new from MRI 12/03/2021 (noncontrast). Electronically Signed   By: Genevive Bi M.D.   On: 02/09/2023 15:42   US Abdomen Limited RUQ (LIVER/GB)  Result Date: 02/09/2023 CLINICAL DATA:  Right upper quadrant discomfort EXAM: ULTRASOUND ABDOMEN LIMITED RIGHT UPPER QUADRANT COMPARISON:  None Available. FINDINGS:  Gallbladder: Mild gallbladder wall thickening measuring 4.2 mm. Mild pericholecystic fluid. No stones, sludge, or Murphy's sign. Common bile duct: Diameter: 2 mm Liver: Diffuse increased echogenicity. No focal mass. Portal vein is patent on color Doppler imaging with normal direction of blood flow towards the liver. Other: Mild ascites in this dialysis patient. The right kidney is echogenic. IMPRESSION: 1. Mild gallbladder wall thickening. Mild pericholecystic fluid is nonspecific in a patient with ascites. No stones, sludge, or Murphy's sign. 2. Diffuse increased echogenicity in the liver is nonspecific but often due to hepatic steatosis. 3. Mild ascites in this dialysis patient. 4. The right kidney is echogenic consistent with medical renal disease. Electronically Signed   By: Gerome Sam III M.D.   On: 02/09/2023 12:10   DG Chest Portable 1 View  Result Date: 02/09/2023 CLINICAL DATA:  Chest pain EXAM: PORTABLE CHEST 1 VIEW COMPARISON:  Feb 04, 2023 FINDINGS: Stable cardiomegaly. Hila and mediastinum are normal. No pneumothorax. Mild bibasilar opacities. IMPRESSION: Mild bibasilar opacities could represent early infiltrate. Developing asymmetric edema is a possibility. Recommend clinical correlation. Electronically Signed   By: Gerome Sam III M.D.   On: 02/09/2023 10:57    Labs:  CBC: Recent Labs    12/05/22 1523 02/09/23 0950 02/09/23 1720 02/10/23 0213  WBC 5.4 8.0 7.9 11.0*  HGB 10.7* 11.3* 10.4* 10.9*  HCT 32.5* 35.9* 33.2* 33.2*  PLT 222 176 161 203    COAGS: Recent Labs    02/09/23 1720  INR 1.2    BMP: Recent Labs    12/05/22 1523 02/09/23 0950 02/10/23 0213  NA 134* 136 132*  K 3.8 4.6 5.2*  CL 98 99 96*  CO2 26 24 21*  GLUCOSE 141* 130* 121*  BUN 49* 37* 47*  CALCIUM 9.2 9.7 9.7  CREATININE 6.48* 5.66* 7.09*  GFRNONAA 10* 12* 9*    LIVER FUNCTION TESTS: Recent Labs    12/05/22 1523 02/09/23 1052  BILITOT 1.4* 1.3*  AST 23 46*  ALT 13 25   ALKPHOS 138* 172*  PROT 7.5 9.1*  ALBUMIN 2.7* 3.5    Assessment and Plan:  47 y.o. male inpatient. History of  HTN, ESRD . Presented with severe onset of right back pain pleuritic in nature. 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 Performed by IR Attending Dr. Vella Redhead on 5.12.24  Right groin access site is soft with no active bleeding and no appreciable pseudoaneurysm. Dressing is C/D/I.BUN 47, Cr 7.09, GFR < 9. Hgb 10.9. Patient appears to be in no acute distress.  Patient to stable from IR perspective s/p angiogram and coil embolization further plans per Critical Care. Orders placed for Follow up with IR / CTA in 4 to 6 weeks. please call IR with questions or concerns.   Electronically Signed: Alene Mires, NP 02/10/2023, 11:33 AM   I spent a total of 15 Minutes at the patient's bedside AND on the patient's hospital floor or unit, greater than 50% of which was counseling/coordinating care for spontaneous intrahepatic arterial pseudoaneurysm s/p coil embolization of the segment 3 hepatic artery

## 2023-02-10 NOTE — Discharge Summary (Signed)
Physician Discharge Summary  Patient ID: Roy Randolph MRN: [id number] DOB/AGE: 10-06-1975 47 y.o.  Admit date: 02/09/2023 Discharge date: 02/10/2023  Admission Diagnoses:  Discharge Diagnoses:  Active Problems:   Hypertensive emergency   Discharged Condition: good  Hospital Course: Patient is a 47 year old male with past medical history of ESRD on dialysis, hypertension and prior pneumothorax in his 30s presenting to the emergency department with right-sided chest pain. The patient states that he woke up in the night with right-sided back pain that radiated into his chest. He states that the pain felt similar to when he had his pneumothorax. He reported some nausea but denies any vomiting. He denies any fevers or chills. He does report some shortness of breath. He denies any diarrhea or constipation.   Prior history of ESRD on HD, HTN with prior PRES.  CT angio of the abdomen demonstrated a hepatic AV fistula, which was subsequently successfully embolized. He required clevidipine following the procedure to control his hypertension until his nausea settled and he was able to take his medications. He consistently refused CPAP for OSA.  The plan was to ambulate the patient and send him to dialysis and then reassess for potential discharge later today but he insisted in leaving AMA so that he might eat a proper meal (his breakfast was cold) and get proper sleep. He stated that he would go to his outpatient slot tomorrow. He stated that he would resume his own medications .   Consults:  IR  Significant Diagnostic Studies: radiology: CT scan: L hepatic AV fistula  Treatments: IR embolization  Discharge Exam: Blood pressure (!) 161/86, pulse 88, temperature (!) 101 F (38.3 C), temperature source Oral, resp. rate 15, height 5\' 9"  (1.753 m), weight 65.8 kg, SpO2 95 %. General appearance: alert, no distress, and uncooperative Resp: clear to auscultation bilaterally Cardio: regular rate  and rhythm, S1, S2 normal, no murmur, click, rub or gallop GI: soft, non-tender; bowel sounds normal; no masses,  no organomegaly Neurologic: Alert and oriented X 3, normal strength and tone. Normal symmetric reflexes. Normal coordination and gait  Disposition:    Allergies as of 02/10/2023       Reactions   Cucumber Extract Anaphylaxis, Itching   Other Anaphylaxis, Itching, Swelling, Other (See Comments)   The patient CANNOT eat raw/fresh fruit or vegetables. Reaction starts with ears itching, then progresses from there. He can eat canned versions of these foods, however.   Tomato (diagnostic) Anaphylaxis, Swelling   Wild Lettuce [wild Lettuce Extract (lactuca Virosa)] Anaphylaxis, Swelling   Hydralazine Hives, Itching, Other (See Comments)   SEVERE ITCHING        Medication List     TAKE these medications    acetaminophen 325 MG tablet Commonly known as: TYLENOL Take 2 tablets (650 mg total) by mouth every 6 (six) hours as needed for fever or mild pain.   amLODipine 10 MG tablet Commonly known as: NORVASC Take 1 tablet (10 mg total) by mouth daily.   bumetanide 2 MG tablet Commonly known as: BUMEX Take 2 mg by mouth 2 (two) times daily. Take 1 tablet (2MG ) by mouth twice daily   calcium acetate 667 MG capsule Commonly known as: PHOSLO Take 1 capsule (667 mg total) by mouth 3 (three) times daily with meals.   cloNIDine 0.3 MG tablet Commonly known as: CATAPRES Take 1 tablet (0.3 mg total) by mouth 3 (three) times daily.   Darbepoetin Alfa 300 MCG/0.6ML Sosy injection Commonly known as: ARANESP Inject 0.6 mLs (  300 mcg total) into the vein every dialysis (Tu,Th,Sat).   labetalol 200 MG tablet Commonly known as: NORMODYNE Take 2 tablets (400 mg total) by mouth 2 (two) times daily.   mirtazapine 7.5 MG tablet Commonly known as: REMERON Take 1 tablet (7.5 mg total) by mouth at bedtime. Start after weaned off Zoloft.   multivitamin Tabs tablet Take 1 tablet by  mouth daily.   NIFEdipine 30 MG 24 hr tablet Commonly known as: ADALAT CC Take 30 mg by mouth 2 (two) times daily.  Take 1 tablet (30 MG) by mouth every 12 hours   pantoprazole 40 MG tablet Commonly known as: PROTONIX Take 1 tablet (40 mg total) by mouth daily.   sertraline 50 MG tablet Commonly known as: Zoloft Take 1 tablet (50 mg total) by mouth daily.   torsemide 100 MG tablet Commonly known as: DEMADEX Take 100 mg by mouth daily. Take after dialysis on dialysis days   Vitamin D (Ergocalciferol) 1.25 MG (50000 UNIT) Caps capsule Commonly known as: DRISDOL Take 1 capsule (50,000 Units total) by mouth once a week.         SignedLynnell Catalan 02/10/2023, 5:08 PM

## 2023-02-10 NOTE — Progress Notes (Addendum)
Bi-pap was initiated successfully on this patient with great results. The patient is now refusing both the bi-pap and the nasal cannula and will desaturate down to the mid 70s occasionally while asleep. This RN educated the patient on the need for oxygen while sleeping and will continue to monitor

## 2023-02-10 NOTE — Progress Notes (Signed)
Patient stated he wanted to leave AMA, Dr Denese Killings came to beside to discuss with patient. Patient signed AMA paperwork, gathered all belongings including meds, phone, charger, and clothes and left.

## 2023-02-11 ENCOUNTER — Emergency Department (HOSPITAL_BASED_OUTPATIENT_CLINIC_OR_DEPARTMENT_OTHER): Payer: Medicaid Other

## 2023-02-11 ENCOUNTER — Encounter (HOSPITAL_BASED_OUTPATIENT_CLINIC_OR_DEPARTMENT_OTHER): Payer: Self-pay

## 2023-02-11 ENCOUNTER — Inpatient Hospital Stay (HOSPITAL_BASED_OUTPATIENT_CLINIC_OR_DEPARTMENT_OTHER)
Admission: EM | Admit: 2023-02-11 | Discharge: 2023-02-14 | DRG: 393 | Disposition: A | Discharge status: Home / Self Care | Payer: Medicaid Other | Attending: Internal Medicine | Admitting: Internal Medicine

## 2023-02-11 DIAGNOSIS — Z79899 Other long term (current) drug therapy: Secondary | ICD-10-CM

## 2023-02-11 DIAGNOSIS — T40605A Adverse effect of unspecified narcotics, initial encounter: Secondary | ICD-10-CM | POA: Diagnosis present

## 2023-02-11 DIAGNOSIS — T81718A Complication of other artery following a procedure, not elsewhere classified, initial encounter: Secondary | ICD-10-CM | POA: Diagnosis present

## 2023-02-11 DIAGNOSIS — J189 Pneumonia, unspecified organism: Secondary | ICD-10-CM | POA: Diagnosis present

## 2023-02-11 DIAGNOSIS — N186 End stage renal disease: Secondary | ICD-10-CM | POA: Diagnosis present

## 2023-02-11 DIAGNOSIS — E871 Hypo-osmolality and hyponatremia: Secondary | ICD-10-CM | POA: Diagnosis present

## 2023-02-11 DIAGNOSIS — F1020 Alcohol dependence, uncomplicated: Secondary | ICD-10-CM | POA: Diagnosis present

## 2023-02-11 DIAGNOSIS — E872 Acidosis, unspecified: Secondary | ICD-10-CM | POA: Diagnosis present

## 2023-02-11 DIAGNOSIS — Z91148 Patient's other noncompliance with medication regimen for other reason: Secondary | ICD-10-CM

## 2023-02-11 DIAGNOSIS — Z91199 Patient's noncompliance with other medical treatment and regimen due to unspecified reason: Secondary | ICD-10-CM | POA: Diagnosis not present

## 2023-02-11 DIAGNOSIS — Z91158 Patient's noncompliance with renal dialysis for other reason: Secondary | ICD-10-CM

## 2023-02-11 DIAGNOSIS — K5900 Constipation, unspecified: Secondary | ICD-10-CM | POA: Diagnosis not present

## 2023-02-11 DIAGNOSIS — R188 Other ascites: Secondary | ICD-10-CM | POA: Diagnosis present

## 2023-02-11 DIAGNOSIS — Z5329 Procedure and treatment not carried out because of patient's decision for other reasons: Secondary | ICD-10-CM | POA: Diagnosis present

## 2023-02-11 DIAGNOSIS — K661 Hemoperitoneum: Secondary | ICD-10-CM | POA: Diagnosis present

## 2023-02-11 DIAGNOSIS — G4733 Obstructive sleep apnea (adult) (pediatric): Secondary | ICD-10-CM | POA: Diagnosis present

## 2023-02-11 DIAGNOSIS — I728 Aneurysm of other specified arteries: Secondary | ICD-10-CM | POA: Diagnosis present

## 2023-02-11 DIAGNOSIS — Z9109 Other allergy status, other than to drugs and biological substances: Secondary | ICD-10-CM

## 2023-02-11 DIAGNOSIS — E875 Hyperkalemia: Secondary | ICD-10-CM | POA: Diagnosis present

## 2023-02-11 DIAGNOSIS — I6783 Posterior reversible encephalopathy syndrome: Secondary | ICD-10-CM | POA: Diagnosis present

## 2023-02-11 DIAGNOSIS — R109 Unspecified abdominal pain: Secondary | ICD-10-CM | POA: Diagnosis not present

## 2023-02-11 DIAGNOSIS — Z992 Dependence on renal dialysis: Secondary | ICD-10-CM | POA: Diagnosis not present

## 2023-02-11 DIAGNOSIS — F1210 Cannabis abuse, uncomplicated: Secondary | ICD-10-CM | POA: Diagnosis present

## 2023-02-11 DIAGNOSIS — Z8049 Family history of malignant neoplasm of other genital organs: Secondary | ICD-10-CM

## 2023-02-11 DIAGNOSIS — A419 Sepsis, unspecified organism: Secondary | ICD-10-CM | POA: Diagnosis present

## 2023-02-11 DIAGNOSIS — R1032 Left lower quadrant pain: Secondary | ICD-10-CM | POA: Diagnosis present

## 2023-02-11 DIAGNOSIS — Z803 Family history of malignant neoplasm of breast: Secondary | ICD-10-CM

## 2023-02-11 DIAGNOSIS — Z888 Allergy status to other drugs, medicaments and biological substances status: Secondary | ICD-10-CM

## 2023-02-11 DIAGNOSIS — D631 Anemia in chronic kidney disease: Secondary | ICD-10-CM | POA: Diagnosis present

## 2023-02-11 DIAGNOSIS — I120 Hypertensive chronic kidney disease with stage 5 chronic kidney disease or end stage renal disease: Secondary | ICD-10-CM | POA: Diagnosis present

## 2023-02-11 DIAGNOSIS — K5903 Drug induced constipation: Secondary | ICD-10-CM | POA: Diagnosis present

## 2023-02-11 DIAGNOSIS — Z1152 Encounter for screening for COVID-19: Secondary | ICD-10-CM | POA: Diagnosis not present

## 2023-02-11 DIAGNOSIS — K9182 Postprocedural hepatic failure: Secondary | ICD-10-CM | POA: Diagnosis present

## 2023-02-11 DIAGNOSIS — F17210 Nicotine dependence, cigarettes, uncomplicated: Secondary | ICD-10-CM | POA: Diagnosis present

## 2023-02-11 DIAGNOSIS — Y838 Other surgical procedures as the cause of abnormal reaction of the patient, or of later complication, without mention of misadventure at the time of the procedure: Secondary | ICD-10-CM | POA: Diagnosis present

## 2023-02-11 DIAGNOSIS — R1013 Epigastric pain: Secondary | ICD-10-CM | POA: Diagnosis present

## 2023-02-11 DIAGNOSIS — Z8249 Family history of ischemic heart disease and other diseases of the circulatory system: Secondary | ICD-10-CM

## 2023-02-11 DIAGNOSIS — I10 Essential (primary) hypertension: Secondary | ICD-10-CM | POA: Diagnosis not present

## 2023-02-11 DIAGNOSIS — R1012 Left upper quadrant pain: Secondary | ICD-10-CM | POA: Diagnosis present

## 2023-02-11 LAB — CBC WITH DIFFERENTIAL/PLATELET
Abs Immature Granulocytes: 0.76 10*3/uL — ABNORMAL HIGH (ref 0.00–0.07)
Basophils Absolute: 0.1 10*3/uL (ref 0.0–0.1)
Basophils Relative: 0 %
Eosinophils Absolute: 0 10*3/uL (ref 0.0–0.5)
Eosinophils Relative: 0 %
HCT: 29.3 % — ABNORMAL LOW (ref 39.0–52.0)
Hemoglobin: 10 g/dL — ABNORMAL LOW (ref 13.0–17.0)
Immature Granulocytes: 3 %
Lymphocytes Relative: 3 %
Lymphs Abs: 0.8 10*3/uL (ref 0.7–4.0)
MCH: 30 pg (ref 26.0–34.0)
MCHC: 34.1 g/dL (ref 30.0–36.0)
MCV: 88 fL (ref 80.0–100.0)
Monocytes Absolute: 3.7 10*3/uL — ABNORMAL HIGH (ref 0.1–1.0)
Monocytes Relative: 13 %
Neutro Abs: 23.1 10*3/uL — ABNORMAL HIGH (ref 1.7–7.7)
Neutrophils Relative %: 81 %
Platelets: 147 10*3/uL — ABNORMAL LOW (ref 150–400)
RBC: 3.33 MIL/uL — ABNORMAL LOW (ref 4.22–5.81)
RDW: 17.2 % — ABNORMAL HIGH (ref 11.5–15.5)
WBC Morphology: INCREASED
WBC: 28.4 10*3/uL — ABNORMAL HIGH (ref 4.0–10.5)
nRBC: 0 % (ref 0.0–0.2)

## 2023-02-11 LAB — COMPREHENSIVE METABOLIC PANEL
ALT: 116 U/L — ABNORMAL HIGH (ref 0–44)
AST: 201 U/L — ABNORMAL HIGH (ref 15–41)
Albumin: 3.6 g/dL (ref 3.5–5.0)
Alkaline Phosphatase: 102 U/L (ref 38–126)
Anion gap: 15 (ref 5–15)
BUN: 71 mg/dL — ABNORMAL HIGH (ref 6–20)
CO2: 19 mmol/L — ABNORMAL LOW (ref 22–32)
Calcium: 9.6 mg/dL (ref 8.9–10.3)
Chloride: 94 mmol/L — ABNORMAL LOW (ref 98–111)
Creatinine, Ser: 9.9 mg/dL — ABNORMAL HIGH (ref 0.61–1.24)
GFR, Estimated: 6 mL/min — ABNORMAL LOW (ref >60)
Glucose, Bld: 88 mg/dL (ref 70–99)
Potassium: 5.9 mmol/L — ABNORMAL HIGH (ref 3.5–5.1)
Sodium: 128 mmol/L — ABNORMAL LOW (ref 135–145)
Total Bilirubin: 1.6 mg/dL — ABNORMAL HIGH (ref 0.3–1.2)
Total Protein: 7.7 g/dL (ref 6.5–8.1)

## 2023-02-11 LAB — URINALYSIS, W/ REFLEX TO CULTURE (INFECTION SUSPECTED)
Bacteria, UA: NONE SEEN
Bilirubin Urine: NEGATIVE
Glucose, UA: NEGATIVE mg/dL
Ketones, ur: NEGATIVE mg/dL
Leukocytes,Ua: NEGATIVE
Nitrite: NEGATIVE
Protein, ur: 100 mg/dL — AB
Specific Gravity, Urine: 1.033 — ABNORMAL HIGH (ref 1.005–1.030)
pH: 5.5 (ref 5.0–8.0)

## 2023-02-11 LAB — APTT: aPTT: 40 seconds — ABNORMAL HIGH (ref 24–36)

## 2023-02-11 LAB — C-REACTIVE PROTEIN: CRP: 30.9 mg/dL — ABNORMAL HIGH (ref <1.0)

## 2023-02-11 LAB — PROTIME-INR
INR: 1.6 — ABNORMAL HIGH (ref 0.8–1.2)
Prothrombin Time: 19.4 seconds — ABNORMAL HIGH (ref 11.4–15.2)

## 2023-02-11 LAB — SARS CORONAVIRUS 2 BY RT PCR: SARS Coronavirus 2 by RT PCR: NEGATIVE

## 2023-02-11 LAB — PROCALCITONIN: Procalcitonin: 148.98 ng/mL

## 2023-02-11 LAB — LACTIC ACID, PLASMA
Lactic Acid, Venous: 1.6 mmol/L (ref 0.5–1.9)
Lactic Acid, Venous: 1.3 mmol/L (ref 0.5–1.9)

## 2023-02-11 LAB — HEPATITIS B SURFACE ANTIBODY, QUANTITATIVE: Hep B S AB Quant (Post): <3.5 m[IU]/mL — ABNORMAL LOW (ref >9.9)

## 2023-02-11 MED ORDER — LEVOFLOXACIN IN D5W 750 MG/150ML IV SOLN
750.0000 mg | INTRAVENOUS | Status: AC
  Administered 2023-02-11: 750 mg via INTRAVENOUS
  Filled 2023-02-11: qty 150

## 2023-02-11 MED ORDER — CHLORHEXIDINE GLUCONATE CLOTH 2 % EX PADS
6.0000 | MEDICATED_PAD | Freq: Every day | CUTANEOUS | Status: DC
  Administered 2023-02-12 – 2023-02-14 (×3): 6 via TOPICAL

## 2023-02-11 MED ORDER — ONDANSETRON HCL 4 MG/2ML IJ SOLN: 4.0000 mg | Freq: Four times a day (QID) | INTRAMUSCULAR | Status: DC | PRN

## 2023-02-11 MED ORDER — FENTANYL CITRATE PF 50 MCG/ML IJ SOSY
50.0000 ug | PREFILLED_SYRINGE | Freq: Once | INTRAMUSCULAR | Status: AC
  Administered 2023-02-11: 50 ug via INTRAVENOUS
  Filled 2023-02-11: qty 1

## 2023-02-11 MED ORDER — IOHEXOL 350 MG/ML SOLN
100.0000 mL | Freq: Once | INTRAVENOUS | Status: AC | PRN
  Administered 2023-02-11: 60 mL via INTRAVENOUS

## 2023-02-11 MED ORDER — METRONIDAZOLE 500 MG/100ML IV SOLN
500.0000 mg | Freq: Once | INTRAVENOUS | Status: AC
  Administered 2023-02-11: 500 mg via INTRAVENOUS
  Filled 2023-02-11: qty 100

## 2023-02-11 MED ORDER — SODIUM ZIRCONIUM CYCLOSILICATE 10 G PO PACK
10.0000 g | PACK | Freq: Once | ORAL | Status: AC
  Administered 2023-02-11: 10 g via ORAL
  Filled 2023-02-11: qty 1

## 2023-02-11 MED ORDER — VANCOMYCIN HCL IN DEXTROSE 1-5 GM/200ML-% IV SOLN
1000.0000 mg | Freq: Once | INTRAVENOUS | Status: AC
  Administered 2023-02-11: 1000 mg via INTRAVENOUS
  Filled 2023-02-11: qty 200

## 2023-02-11 MED ORDER — METRONIDAZOLE 500 MG/100ML IV SOLN
500.0000 mg | Freq: Two times a day (BID) | INTRAVENOUS | Status: DC
  Administered 2023-02-11 – 2023-02-13 (×5): 500 mg via INTRAVENOUS
  Filled 2023-02-11 (×4): qty 100

## 2023-02-11 MED ORDER — SODIUM CHLORIDE 0.9 % IV SOLN
2.0000 g | Freq: Once | INTRAVENOUS | Status: AC
  Administered 2023-02-11: 2 g via INTRAVENOUS
  Filled 2023-02-11: qty 12.5

## 2023-02-11 MED ORDER — SEVELAMER CARBONATE 800 MG PO TABS
800.0000 mg | ORAL_TABLET | Freq: Three times a day (TID) | ORAL | Status: DC
  Administered 2023-02-12 – 2023-02-14 (×6): 800 mg via ORAL
  Filled 2023-02-11 (×6): qty 1

## 2023-02-11 MED ORDER — SODIUM BICARBONATE 8.4 % IV SOLN
50.0000 meq | Freq: Once | INTRAVENOUS | Status: AC
  Administered 2023-02-11: 50 meq via INTRAVENOUS
  Filled 2023-02-11: qty 50

## 2023-02-11 MED ORDER — HYDRALAZINE HCL 20 MG/ML IJ SOLN: 10.0000 mg | Freq: Four times a day (QID) | INTRAMUSCULAR | Status: DC | PRN

## 2023-02-11 MED ORDER — HYDROMORPHONE HCL 1 MG/ML IJ SOLN
1.0000 mg | INTRAMUSCULAR | Status: DC | PRN
  Administered 2023-02-11 – 2023-02-13 (×9): 1 mg via INTRAVENOUS
  Filled 2023-02-11 (×9): qty 1

## 2023-02-11 MED ORDER — ACETAMINOPHEN 325 MG PO TABS
650.0000 mg | ORAL_TABLET | Freq: Four times a day (QID) | ORAL | Status: DC | PRN
  Administered 2023-02-11: 650 mg via ORAL
  Filled 2023-02-11: qty 2

## 2023-02-11 MED ORDER — CLONIDINE HCL 0.2 MG PO TABS
0.3000 mg | ORAL_TABLET | Freq: Three times a day (TID) | ORAL | Status: DC
  Administered 2023-02-11 – 2023-02-14 (×8): 0.3 mg via ORAL
  Filled 2023-02-11 (×8): qty 1

## 2023-02-11 MED ORDER — BISACODYL 5 MG PO TBEC: 5.0000 mg | DELAYED_RELEASE_TABLET | Freq: Every day | ORAL | Status: DC | PRN

## 2023-02-11 MED ORDER — PANTOPRAZOLE SODIUM 40 MG PO TBEC
40.0000 mg | DELAYED_RELEASE_TABLET | Freq: Every day | ORAL | Status: DC
  Administered 2023-02-12 – 2023-02-14 (×3): 40 mg via ORAL
  Filled 2023-02-11 (×3): qty 1

## 2023-02-11 MED ORDER — ACETAMINOPHEN 500 MG PO TABS
1000.0000 mg | ORAL_TABLET | Freq: Once | ORAL | Status: AC
  Administered 2023-02-11: 1000 mg via ORAL
  Filled 2023-02-11: qty 2

## 2023-02-11 MED ORDER — LEVOFLOXACIN IN D5W 500 MG/100ML IV SOLN
500.0000 mg | INTRAVENOUS | Status: DC
  Administered 2023-02-12 – 2023-02-14 (×2): 500 mg via INTRAVENOUS
  Filled 2023-02-11 (×2): qty 100

## 2023-02-11 MED ORDER — AMLODIPINE BESYLATE 10 MG PO TABS
10.0000 mg | ORAL_TABLET | Freq: Every day | ORAL | Status: DC
  Administered 2023-02-12 – 2023-02-14 (×3): 10 mg via ORAL
  Filled 2023-02-11 (×3): qty 1

## 2023-02-11 MED ORDER — RENA-VITE PO TABS
1.0000 | ORAL_TABLET | Freq: Every day | ORAL | Status: DC
  Administered 2023-02-12 – 2023-02-14 (×3): 1 via ORAL
  Filled 2023-02-11 (×3): qty 1

## 2023-02-11 MED ORDER — ONDANSETRON HCL 4 MG PO TABS: 4.0000 mg | ORAL_TABLET | Freq: Four times a day (QID) | ORAL | Status: DC | PRN

## 2023-02-11 MED ORDER — DEXTROSE 5 % IV SOLN: INTRAVENOUS | Status: DC

## 2023-02-11 MED ORDER — LABETALOL HCL 200 MG PO TABS
400.0000 mg | ORAL_TABLET | Freq: Two times a day (BID) | ORAL | Status: DC
  Administered 2023-02-11 – 2023-02-14 (×6): 400 mg via ORAL
  Filled 2023-02-11 (×6): qty 2

## 2023-02-11 MED ORDER — ACETAMINOPHEN 650 MG RE SUPP: 650.0000 mg | Freq: Four times a day (QID) | RECTAL | Status: DC | PRN

## 2023-02-11 MED ORDER — CALCIUM GLUCONATE 10 % IV SOLN
1.0000 g | Freq: Once | INTRAVENOUS | Status: AC
  Administered 2023-02-11: 1 g via INTRAVENOUS
  Filled 2023-02-11: qty 10

## 2023-02-11 NOTE — ED Notes (Signed)
ED TO INPATIENT HANDOFF REPORT  ED Nurse Name and Phone #:    S Name/Age/Gender Roy Randolph 47 y.o. male Room/Bed: DB012/DB012  Code Status   Code Status: Prior  Home/SNF/Other Home Patient oriented to: self, place, time, and situation Is this baseline? Yes   Triage Complete: Triage complete  Chief Complaint Sepsis due to undetermined organism [facility] [A41.9]  Triage Note Patient c/o  abd pain. Patient unable to stand or walk without assistance. Patient had a procedure done to his liver 2 days ago. Patient is tachypneic without exertion, SPO2 90%.    Allergies Allergies  Allergen Reactions   Cucumber Extract Anaphylaxis and Itching   Other Anaphylaxis, Itching, Swelling and Other (See Comments)    The patient CANNOT eat raw/fresh fruit or vegetables. Reaction starts with ears itching, then progresses from there. He can eat canned versions of these foods, however.   Tomato (Diagnostic) Anaphylaxis and Swelling   Wild Lettuce [Wild Lettuce Extract (Lactuca Virosa)] Anaphylaxis and Swelling   Hydralazine Hives, Itching and Other (See Comments)    SEVERE ITCHING    Level of Care/Admitting Diagnosis ED Disposition     ED Disposition  Admit   Condition  --   Comment  Hospital Area: [facility] [[id number]]  Level of Care: Progressive [102]  Admit to Progressive based on following criteria: MULTISYSTEM THREATS such as stable sepsis, metabolic/electrolyte imbalance with or without encephalopathy that is responding to early treatment.  Admit to Progressive based on following criteria: COMPLICATED UROLOGY Patients requiring frequent assessments and interventions, such as continuous bladder irrigations, immediate post-op surgical procedures, i.e., bladder removal/ileal conduit.  May admit patient to [facility] or [facility] if equivalent level of care is available:: No  Interfacility transfer: Yes  Covid Evaluation: Symptomatic Person Under Investigation  (PUI) or recent exposure (last 10 days) *Testing Required*  Diagnosis: Sepsis due to undetermined organism [facility]) [[id number]]  Admitting Physician: Jonah Blue [[id number]]  Attending Physician: Jonah Blue [[id number]]  Certification:: I certify this patient will need inpatient services for at least 2 midnights  Estimated Length of Stay: 3          B Medical/Surgery History Past Medical History:  Diagnosis Date   Abnormal EKG 10/10/2021   Alcohol use disorder in remission 11/14/2021   Elevated troponin 10/10/2021   ESRD on hemodialysis (HCC) 10/10/2021   History of migraine 11/14/2021   Hypertension    Hypertensive emergency due to non compliance 10/10/2021   Hypokalemia 10/10/2021   Left arm numbness and pain 10/10/2021   Marijuana use 10/10/2021   Noncompliance    NSAID long-term use 10/10/2021   PRES (posterior reversible encephalopathy syndrome)    Smoker 10/10/2021   Syncope, vasovagal 10/10/2021   Thrombocytopenia (HCC) 10/10/2021   Vision disturbance 11/14/2021   Past Surgical History:  Procedure Laterality Date   AV FISTULA PLACEMENT Left 01/01/2022   Procedure: LEFT ARM BRACHIOCEPHALIC ARTERIOVENOUS  FISTULA CREATION;  Surgeon: Maeola Harman, MD;  Location: [facility] OR;  Service: Vascular;  Laterality: Left;   IR ANGIOGRAM VISCERAL SELECTIVE  02/09/2023   IR ANGIOGRAM VISCERAL SELECTIVE  02/09/2023   IR ANGIOGRAM VISCERAL SELECTIVE  02/09/2023   IR EMBO ART  VEN HEMORR LYMPH EXTRAV  INC GUIDE ROADMAPPING  02/09/2023   IR FLUORO GUIDE CV LINE RIGHT  12/24/2021   IR FLUORO GUIDE CV LINE RIGHT  12/27/2021   IR US GUIDE VASC ACCESS RIGHT  12/24/2021   IR US GUIDE VASC ACCESS RIGHT  02/09/2023  THORACOTOMY Right      A IV Location/Drains/Wounds Patient Lines/Drains/Airways Status     Active Line/Drains/Airways     Name Placement date Placement time Site Days   Peripheral IV 02/11/23 20 G Anterior;Right;Upper Arm 02/11/23  0553  Arm  less than 1   Peripheral IV  02/11/23 20 G Anterior;Proximal;Right Forearm 02/11/23  0540  Forearm  less than 1   Fistula / Graft Left Upper arm Arteriovenous fistula 01/01/22  1226  Upper arm  406   Hemodialysis Catheter Right Internal jugular Double lumen Permanent (Tunneled) 12/27/21  0917  Internal jugular  411            Intake/Output Last 24 hours No intake or output data in the 24 hours ending 02/11/23 1407  Labs/Imaging Results for orders placed or performed during the hospital encounter of 02/11/23 (from the past 48 hour(s))  Comprehensive metabolic panel     Status: Abnormal   Collection Time: 02/11/23  5:47 AM  Result Value Ref Range   Sodium 128 (L) 135 - 145 mmol/L   Potassium 5.9 (H) 3.5 - 5.1 mmol/L   Chloride 94 (L) 98 - 111 mmol/L   CO2 19 (L) 22 - 32 mmol/L   Glucose, Bld 88 70 - 99 mg/dL    Comment: Glucose reference range applies only to samples taken after fasting for at least 8 hours.   BUN 71 (H) 6 - 20 mg/dL   Creatinine, Ser 8.29 (H) 0.61 - 1.24 mg/dL   Calcium 9.6 8.9 - 56.2 mg/dL   Total Protein 7.7 6.5 - 8.1 g/dL   Albumin 3.6 3.5 - 5.0 g/dL   AST 130 (H) 15 - 41 U/L   ALT 116 (H) 0 - 44 U/L   Alkaline Phosphatase 102 38 - 126 U/L   Total Bilirubin 1.6 (H) 0.3 - 1.2 mg/dL   GFR, Estimated 6 (L) >60 mL/min    Comment: (NOTE) Calculated using the CKD-EPI Creatinine Equation (2021)    Anion gap 15 5 - 15    Comment: Performed at [facility], [address] [place], [place] [address]  Lactic acid, plasma     Status: None   Collection Time: 02/11/23  5:47 AM  Result Value Ref Range   Lactic Acid, Venous 1.6 0.5 - 1.9 mmol/L    Comment: Performed at [facility], [address] [place], [place] [address]  CBC with Differential     Status: Abnormal   Collection Time: 02/11/23  5:47 AM  Result Value Ref Range   WBC 28.4 (H) 4.0 - 10.5 K/uL   RBC 3.33 (L) 4.22 - 5.81 MIL/uL   Hemoglobin 10.0 (L) 13.0 - 17.0 g/dL   HCT 95.2 (L) 84.1 -  52.0 %   MCV 88.0 80.0 - 100.0 fL   MCH 30.0 26.0 - 34.0 pg   MCHC 34.1 30.0 - 36.0 g/dL   RDW 32.4 (H) 40.1 - 02.7 %   Platelets 147 (L) 150 - 400 K/uL   nRBC 0.0 0.0 - 0.2 %   Neutrophils Relative % 81 %   Neutro Abs 23.1 (H) 1.7 - 7.7 K/uL   Lymphocytes Relative 3 %   Lymphs Abs 0.8 0.7 - 4.0 K/uL   Monocytes Relative 13 %   Monocytes Absolute 3.7 (H) 0.1 - 1.0 K/uL   Eosinophils Relative 0 %   Eosinophils Absolute 0.0 0.0 - 0.5 K/uL   Basophils Relative 0 %   Basophils Absolute 0.1 0.0 - 0.1 K/uL  WBC Morphology INCREASED BANDS (>20% BANDS)    Smear Review MORPHOLOGY UNREMARKABLE    Immature Granulocytes 3 %   Abs Immature Granulocytes 0.76 (H) 0.00 - 0.07 K/uL   Polychromasia PRESENT     Comment: Performed at [facility], [address] [place], [place] [address]  Urinalysis, w/ Reflex to Culture (Infection Suspected) -Urine, Clean Catch     Status: Abnormal   Collection Time: 02/11/23  6:50 AM  Result Value Ref Range   Specimen Source URINE, CLEAN CATCH    Color, Urine YELLOW YELLOW   APPearance HAZY (A) CLEAR   Specific Gravity, Urine 1.033 (H) 1.005 - 1.030   pH 5.5 5.0 - 8.0   Glucose, UA NEGATIVE NEGATIVE mg/dL   Hgb urine dipstick TRACE (A) NEGATIVE   Bilirubin Urine NEGATIVE NEGATIVE   Ketones, ur NEGATIVE NEGATIVE mg/dL   Protein, ur 956 (A) NEGATIVE mg/dL   Nitrite NEGATIVE NEGATIVE   Leukocytes,Ua NEGATIVE NEGATIVE   RBC / HPF 0-5 0 - 5 RBC/hpf   WBC, UA 6-10 0 - 5 WBC/hpf    Comment:        Reflex urine culture not performed if WBC <=10, OR if Squamous epithelial cells >5. If Squamous epithelial cells >5 suggest recollection.    Bacteria, UA NONE SEEN NONE SEEN   Squamous Epithelial / HPF 0-5 0 - 5 /HPF    Comment: Performed at [facility], [address] [place], [place] [address]  Lactic acid, plasma     Status: None   Collection Time: 02/11/23  8:50 AM  Result Value Ref Range   Lactic Acid, Venous 1.3  0.5 - 1.9 mmol/L    Comment: Performed at [facility], [address] [place], [place] [address]  Protime-INR     Status: Abnormal   Collection Time: 02/11/23  8:50 AM  Result Value Ref Range   Prothrombin Time 19.4 (H) 11.4 - 15.2 seconds   INR 1.6 (H) 0.8 - 1.2    Comment: (NOTE) INR goal varies based on device and disease states. Performed at [facility], [address], [place], [place] [address]   APTT     Status: Abnormal   Collection Time: 02/11/23  8:50 AM  Result Value Ref Range   aPTT 40 (H) 24 - 36 seconds    Comment:        IF BASELINE aPTT IS ELEVATED, SUGGEST PATIENT RISK ASSESSMENT BE USED TO DETERMINE APPROPRIATE ANTICOAGULANT THERAPY. Performed at [facility], [address] [place], [place] [address]   SARS Coronavirus 2 by RT PCR (hospital order, performed in [facility] hospital lab) *cepheid single result test* Anterior Nasal Swab     Status: None   Collection Time: 02/11/23 10:18 AM   Specimen: Anterior Nasal Swab  Result Value Ref Range   SARS Coronavirus 2 by RT PCR NEGATIVE NEGATIVE    Comment: (NOTE) SARS-CoV-2 target nucleic acids are NOT DETECTED.  The SARS-CoV-2 RNA is generally detectable in upper and lower respiratory specimens during the acute phase of infection. The lowest concentration of SARS-CoV-2 viral copies this assay can detect is 250 copies / mL. A negative result does not preclude SARS-CoV-2 infection and should not be used as the sole basis for treatment or other patient management decisions.  A negative result may occur with improper specimen collection / handling, submission of specimen other than nasopharyngeal swab, presence of viral mutation(s) within the areas targeted by this assay, and inadequate number of viral  copies (<250 copies / mL). A negative result must be combined with clinical observations, patient history, and epidemiological information.  Fact  Sheet for Patients:   RoadLapTop.co.za  Fact Sheet for Healthcare Providers: http://kim-miller.com/  This test is not yet approved or  cleared by the [place] FDA and has been authorized for detection and/or diagnosis of SARS-CoV-2 by FDA under an Emergency Use Authorization (EUA).  This EUA will remain in effect (meaning this test can be used) for the duration of the COVID-19 declaration under Section 564(b)(1) of the Act, 21 U.S.C. section 360bbb-3(b)(1), unless the authorization is terminated or revoked sooner.  Performed at [facility], [address], [place], [place] [address]    CT Angio Chest/Abd/Pel for Dissection W and/or Wo Contrast  Result Date: 02/11/2023 CLINICAL DATA:  47 year old male with history of worsening pain, fever and hypoxia. Recent history of hepatic arteriovenous malformation coil procedure. EXAM: CT ANGIOGRAPHY CHEST, ABDOMEN AND PELVIS TECHNIQUE: Non-contrast CT of the chest was initially obtained. Multidetector CT imaging through the chest, abdomen and pelvis was performed using the standard protocol during bolus administration of intravenous contrast. Multiplanar reconstructed images and MIPs were obtained and reviewed to evaluate the vascular anatomy. RADIATION DOSE REDUCTION: This exam was performed according to the departmental dose-optimization program which includes automated exposure control, adjustment of the mA and/or kV according to patient size and/or use of iterative reconstruction technique. CONTRAST:  60mL OMNIPAQUE IOHEXOL 350 MG/ML SOLN COMPARISON:  CTA of the chest, abdomen and pelvis 02/09/2023. FINDINGS: CTA CHEST FINDINGS Cardiovascular: Heart size is mildly enlarged. There is no significant pericardial fluid, thickening or pericardial calcification. No acute abnormality of the thoracic aorta or the great vessels of the mediastinum. Ascending thoracic aorta, mid aortic arch and  descending thoracic aorta measure 3.2 cm, 3.0 cm and 2.3 cm in diameter respectively. No evidence of thoracic aortic dissection. Mediastinum/Nodes: Multiple prominent borderline enlarged mediastinal and bilateral hilar lymph nodes measuring up to 1.4 cm in short axis in the subcarinal nodal station, nonspecific, but similar to the prior study, and favored to be benign. Esophagus is unremarkable in appearance. No axillary lymphadenopathy. Lungs/Pleura: Dependent opacities are noted in the lungs bilaterally (left-greater-than-right), which appear predominantly atelectatic, although less well-defined areas of ground-glass attenuation and airspace consolidation are noted in the extreme lung bases, which may suggest areas of infectious consolidation and inflammation. No pleural effusions. Pleural nodularity is noted in the right hemithorax, best appreciated on axial images 48 and 49 of series 4, where the largest pleural nodule measures 1.7 x 1.6 cm (40 HU). No left-sided pleural nodularity is noted. Mild diffuse bronchial wall thickening with mild centrilobular and paraseptal emphysema. No definite suspicious appearing pulmonary nodules or masses are noted. Musculoskeletal: There are no aggressive appearing lytic or blastic lesions noted in the visualized portions of the skeleton. Review of the MIP images confirms the above findings. CTA ABDOMEN AND PELVIS FINDINGS VASCULAR Aorta: Aortic atherosclerosis. Normal caliber aorta without aneurysm, dissection, vasculitis or significant stenosis. Celiac: Embolization coils are noted in the region of the of the left hepatic artery. Celiac axis and branches are otherwise without evidence of aneurysm, dissection, vasculitis or significant stenosis. SMA: Patent without evidence of aneurysm, dissection, vasculitis or significant stenosis. Renals: Both renal arteries are patent without evidence of aneurysm, dissection, vasculitis, fibromuscular dysplasia or significant stenosis.  IMA: Patent without evidence of aneurysm, dissection, vasculitis or significant stenosis. Inflow: Patent without evidence of significant stenosis. Mild aneurysmal dilatation of the right common iliac artery (1.7 cm  in diameter). Short segment dissection of the distal left common iliac artery which propagates into the left external and internal iliac arteries over a very short distance, and is associated with mild aneurysmal dilatation of the left internal iliac artery (axial image 231 of series 4) measuring 1.1 cm in diameter, similar to the prior study. Veins: No obvious venous abnormality within the limitations of this arterial phase study. Review of the MIP images confirms the above findings. NON-VASCULAR Hepatobiliary: Mild enlargement and heterogeneous attenuation in the left lobe of the liver predominantly in segment 3 related to prior intrahepatic hemorrhage and subsequent embolization. Perfusion throughout this region appears slightly diminished, likely secondary to embolization of branches of the left hepatic artery, without definitive evidence of persistent perfusion of the previously embolized pseudoaneurysm. No well organized fluid collection is confidently identified at this time. Liver is otherwise diffusely enlarged, but no focal hepatic lesions are noted. No intra or extrahepatic biliary ductal dilatation. High attenuation material within the lumen of the gallbladder is compatible with vicarious excretion of contrast material related to prior contrast-enhanced CT examinations and embolization procedure. Pancreas: No pancreatic mass. No pancreatic ductal dilatation. No pancreatic or peripancreatic fluid collections or inflammatory changes. Spleen: Unremarkable. Adrenals/Urinary Tract: Bilateral kidneys and adrenal glands are normal in appearance. No hydroureteronephrosis. Urinary bladder is nearly completely decompressed and otherwise unremarkable in appearance. Stomach/Bowel: The appearance of the  stomach is normal. There is no pathologic dilatation of small bowel or colon. Normal appendix. Lymphatic: No lymphadenopathy noted in the abdomen or pelvis. Reproductive: Prostate gland is unremarkable in appearance. Other: Small to moderate volume of ascites, most evident adjacent to the liver and in the low anatomic pelvis. This is intermediate to high attenuation, indicative of internal blood products. Musculoskeletal: There are no aggressive appearing lytic or blastic lesions noted in the visualized portions of the skeleton. Review of the MIP images confirms the above findings. IMPRESSION: 1. 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 2. Small to moderate volume of intermediate to high attenuation ascites in the peritoneal cavity reflecting the presence of blood products within the ascites. No well organized fluid collection identified to suggest abscess in the abdomen or pelvis. 3. Dependent opacities in the lung bases predominantly atelectatic, although some areas of airspace consolidation are evident, which could reflect resolving infection or sequela of aspiration. 4. Pleural nodularity in the posterior aspect of the right hemithorax. This is of uncertain etiology and significance, but the possibility of malignancy is not entirely excluded. Follow-up contrast-enhanced chest CT is recommended in 2 months to re-evaluate this finding and ensure the stability or regression. At the time of the follow-up imaging, attention to multiple borderline enlarged mediastinal and bilateral hilar lymph nodes is also suggested to ensure stability or regression. 5. Cardiomegaly. 6. Aortic atherosclerosis with mild aneurysmal dilatation of the right common iliac artery, and short segment dissection of the distal left common iliac artery which propagates into the proximal aspects of the left external and internal  iliac arteries, with mild aneurysmal dilatation of the proximal left internal iliac artery, similar to the prior study, as above. 7. Mild diffuse bronchial wall thickening with mild centrilobular and paraseptal emphysema; imaging findings suggestive of underlying COPD. Electronically Signed   By: Trudie Reed M.D.   On: 02/11/2023 06:59   DG Chest 1 View  Result Date: 02/11/2023 CLINICAL DATA:  Shortness of breath, fever and hypoxia. Two days ago underwent  coil embolization of a left hepatic pseudoaneurysm. EXAM: CHEST  1 VIEW COMPARISON:  CTA chest 02/09/2023 FINDINGS: There is mild cardiomegaly. Vascular markings are normal caliber. The lungs show chronic changes and linear platelike basilar atelectasis without appreciable infiltrates. A single surgical clip superimposes in the right upper to mid lung field. The mediastinum is normally outlined. The thoracic cage is intact.  There is overlying monitor wiring. Compare: Overall aeration seems unchanged. IMPRESSION: Chronic lung changes with no evidence of acute chest process. Mild cardiomegaly. Electronically Signed   By: Almira Bar M.D.   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 He presents for emergent angiogram and embolization. EXAM: IR EMBO ART VEN HEMORR LYMPH EXTRAV INC GUIDE ROADMAPPING; SELECTIVE VISCERAL ARTERIOGRAPHY; IR ULTRASOUND GUIDANCE VASC ACCESS RIGHT MEDICATIONS: None. ANESTHESIA/SEDATION: Moderate (conscious) sedation was employed during this procedure. A total of Versed 1 mg and Fentanyl 25 mcg was administered intravenously. Moderate Sedation Time: 83 minutes. The patient's level of consciousness and vital signs were monitored continuously by radiology nursing throughout the procedure under my direct supervision. CONTRAST:  70 mL Isovue 300 FLUOROSCOPY:  Radiation Exposure Index (as provided by the fluoroscopic device): 1,099 mGy Kerma COMPLICATIONS: None immediate. PROCEDURE: Informed consent was obtained from the patient following explanation of the procedure, risks, benefits and alternatives. The patient understands, agrees and consents for the procedure. All questions were addressed. A time out was performed prior to the initiation of the procedure. Maximal barrier sterile technique utilized including caps, mask, sterile gowns, sterile gloves, large sterile drape, hand hygiene, and Betadine prep. The right common femoral artery was interrogated with ultrasound and found to be widely patent. An image was obtained and stored for the medical record. Local anesthesia was attained by infiltration with 1% lidocaine. A small dermatotomy was made. Under real-time sonographic guidance, the vessel was punctured with a 21 gauge micropuncture needle. Using standard technique, the initial micro needle was exchanged over a 0.018 micro wire for a transitional 4 [place] micro sheath. The micro sheath was then exchanged over a 0.035 wire for a 5 French vascular sheath. A Sos Omni selective catheter was advanced over a Bentson wire and arteriography was performed. There is inadvertent catheterization of the right inferior phrenic artery with arteriogram. No evidence of abnormality. The celiac axis was catheterized and arteriography was performed. Unfortunately, the vessel is very robust and extremely down sloping. The Sos catheter points at a prominent left gastric artery. Purchase is not particularly strong. A longer reverse curve catheter is necessary. Therefore, the Sos catheter was exchanged for a Simmons catheter which was advanced over the aortic bifurcation. The Simmons catheter was used to catheterize the common hepatic artery. A hepatic arteriogram is performed. Hypertrophic and highly tortuous hepatic arteries. Large pseudoaneurysm arising from the segment 3 left hepatic  artery measures grossly 5.3 x 3.3 cm. There is intravasation into an drainage through the left hepatic vein. A Cook can tot a microcatheter was advanced over a Fathom wire and used to select the left hepatic artery. Arteriography was performed. The segment 2 hepatic artery is visualized. The segment 3 hepatic artery bifurcates into a superior and inferior division. The pseudoaneurysm arises from the inferior division of the segment 3 artery. The microcatheter was further advanced into the inferior division. Arteriography again was again performed confirming the location of the arterial injury and pseudoaneurysm. The vessel  measures approximately 2.7 mm. First, embolization was attempted using a Medtronic microvascular plug. Unfortunately, the plug could not be advanced due to the high tortuosity of the vessels without backing the catheter out. Therefore, the micro vascular plug was discarded. An initial attempt was made to embolize using a 4 x 15 mm Ruby low-profile detachable coil. However, the coil would not form within the artery without prolapsing into the large pseudoaneurysm sac. Therefore, therefore, this coil was recovered and set aside on the back table. Utilizing an exchange length 0.014 wire, the cantata microcatheter was exchanged for a lantern microcatheter. A 3 mm pod detachable coil was successfully deployed in the inferior division of the segment 3 artery forming the initial coil pack. Subsequent coil embolization was performed utilizing the 4 x 15 mm Ruby low-profile detachable coil, a 30 cm low-profile packing coil and a 30 cm standard packing coil. This resulted in complete embolization of the inferior and superior divisions of the segment 3 hepatic artery. Follow-up arteriography demonstrates no further opacification of the pseudoaneurysm. The catheters were removed. Hemostasis was attained with the assistance of a Celt arterial closure device. IMPRESSION: 1. Large arterial pseudoaneurysm arising  from the inferior division of the segment 3 hepatic artery. There is associated intravasation through the hepatic parenchyma resulting in arteriovenous shunting into the left hepatic vein. 2. Successful coil embolization of the segment 3 hepatic artery with no further flow into the pseudoaneurysm. Electronically Signed   By: Malachy Moan M.D.   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 He presents for emergent angiogram and embolization. EXAM: IR EMBO ART VEN HEMORR LYMPH EXTRAV INC GUIDE ROADMAPPING; SELECTIVE VISCERAL ARTERIOGRAPHY; IR ULTRASOUND GUIDANCE VASC ACCESS RIGHT MEDICATIONS: None. ANESTHESIA/SEDATION: Moderate (conscious) sedation was employed during this procedure. A total of Versed 1 mg and Fentanyl 25 mcg was administered intravenously. Moderate Sedation Time: 83 minutes. The patient's level of consciousness and vital signs were monitored continuously by radiology nursing throughout the procedure under my direct supervision. CONTRAST:  70 mL Isovue 300 FLUOROSCOPY: Radiation Exposure Index (as provided by the fluoroscopic device): 1,099 mGy Kerma COMPLICATIONS: None immediate. PROCEDURE: Informed consent was obtained from the patient following explanation of the procedure, risks, benefits and alternatives. The patient understands, agrees and consents for the procedure. All questions were addressed. A time out was performed prior to the initiation of the procedure. Maximal barrier sterile technique utilized including caps, mask, sterile gowns, sterile gloves, large sterile drape, hand hygiene, and Betadine prep. The right common femoral artery was interrogated with ultrasound and found to be widely patent. An image was obtained and stored for the medical record. Local anesthesia was attained by infiltration with 1% lidocaine. A small dermatotomy  was made. Under real-time sonographic guidance, the vessel was punctured with a 21 gauge micropuncture needle. Using standard technique, the initial micro needle was exchanged over a 0.018 micro wire for a transitional 4 [place] micro sheath. The micro sheath was then exchanged over a 0.035 wire for a 5 French vascular sheath. A Sos Omni selective catheter was advanced over a Bentson wire and arteriography was performed. There is inadvertent catheterization of the right inferior phrenic artery with arteriogram. No evidence of abnormality. The celiac axis was catheterized and arteriography was performed. Unfortunately, the vessel is very robust and extremely down sloping. The Sos catheter points at a prominent left gastric artery. Purchase is not particularly strong. A  longer reverse curve catheter is necessary. Therefore, the Sos catheter was exchanged for a Simmons catheter which was advanced over the aortic bifurcation. The Simmons catheter was used to catheterize the common hepatic artery. A hepatic arteriogram is performed. Hypertrophic and highly tortuous hepatic arteries. Large pseudoaneurysm arising from the segment 3 left hepatic artery measures grossly 5.3 x 3.3 cm. There is intravasation into an drainage through the left hepatic vein. A Cook can tot a microcatheter was advanced over a Fathom wire and used to select the left hepatic artery. Arteriography was performed. The segment 2 hepatic artery is visualized. The segment 3 hepatic artery bifurcates into a superior and inferior division. The pseudoaneurysm arises from the inferior division of the segment 3 artery. The microcatheter was further advanced into the inferior division. Arteriography again was again performed confirming the location of the arterial injury and pseudoaneurysm. The vessel measures approximately 2.7 mm. First, embolization was attempted using a Medtronic microvascular plug. Unfortunately, the plug could not be advanced due to the  high tortuosity of the vessels without backing the catheter out. Therefore, the micro vascular plug was discarded. An initial attempt was made to embolize using a 4 x 15 mm Ruby low-profile detachable coil. However, the coil would not form within the artery without prolapsing into the large pseudoaneurysm sac. Therefore, therefore, this coil was recovered and set aside on the back table. Utilizing an exchange length 0.014 wire, the cantata microcatheter was exchanged for a lantern microcatheter. A 3 mm pod detachable coil was successfully deployed in the inferior division of the segment 3 artery forming the initial coil pack. Subsequent coil embolization was performed utilizing the 4 x 15 mm Ruby low-profile detachable coil, a 30 cm low-profile packing coil and a 30 cm standard packing coil. This resulted in complete embolization of the inferior and superior divisions of the segment 3 hepatic artery. Follow-up arteriography demonstrates no further opacification of the pseudoaneurysm. The catheters were removed. Hemostasis was attained with the assistance of a Celt arterial closure device. IMPRESSION: 1. Large arterial pseudoaneurysm arising from the inferior division of the segment 3 hepatic artery. There is associated intravasation through the hepatic parenchyma resulting in arteriovenous shunting into the left hepatic vein. 2. Successful coil embolization of the segment 3 hepatic artery with no further flow into the pseudoaneurysm. Electronically Signed   By: Malachy Moan M.D.   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 He presents for emergent angiogram and embolization. EXAM: IR EMBO ART VEN HEMORR LYMPH EXTRAV INC GUIDE ROADMAPPING; SELECTIVE VISCERAL ARTERIOGRAPHY; IR ULTRASOUND GUIDANCE VASC ACCESS RIGHT MEDICATIONS: None. ANESTHESIA/SEDATION: Moderate  (conscious) sedation was employed during this procedure. A total of Versed 1 mg and Fentanyl 25 mcg was administered intravenously. Moderate Sedation Time: 83 minutes. The patient's level of consciousness and vital signs were monitored continuously by radiology nursing throughout the procedure under my direct supervision. CONTRAST:  70 mL Isovue 300 FLUOROSCOPY: Radiation Exposure Index (as provided by the fluoroscopic device): 1,099 mGy Kerma COMPLICATIONS: None immediate. PROCEDURE: Informed consent was obtained from the patient following explanation of the procedure, risks, benefits and alternatives. The patient understands, agrees and consents for the procedure. All questions were addressed. A time out was performed prior to the initiation of the procedure. Maximal barrier sterile technique utilized including caps, mask, sterile gowns, sterile gloves, large sterile drape, hand hygiene, and Betadine prep. The right common femoral artery was interrogated  with ultrasound and found to be widely patent. An image was obtained and stored for the medical record. Local anesthesia was attained by infiltration with 1% lidocaine. A small dermatotomy was made. Under real-time sonographic guidance, the vessel was punctured with a 21 gauge micropuncture needle. Using standard technique, the initial micro needle was exchanged over a 0.018 micro wire for a transitional 4 [place] micro sheath. The micro sheath was then exchanged over a 0.035 wire for a 5 French vascular sheath. A Sos Omni selective catheter was advanced over a Bentson wire and arteriography was performed. There is inadvertent catheterization of the right inferior phrenic artery with arteriogram. No evidence of abnormality. The celiac axis was catheterized and arteriography was performed. Unfortunately, the vessel is very robust and extremely down sloping. The Sos catheter points at a prominent left gastric artery. Purchase is not particularly strong. A longer  reverse curve catheter is necessary. Therefore, the Sos catheter was exchanged for a Simmons catheter which was advanced over the aortic bifurcation. The Simmons catheter was used to catheterize the common hepatic artery. A hepatic arteriogram is performed. Hypertrophic and highly tortuous hepatic arteries. Large pseudoaneurysm arising from the segment 3 left hepatic artery measures grossly 5.3 x 3.3 cm. There is intravasation into an drainage through the left hepatic vein. A Cook can tot a microcatheter was advanced over a Fathom wire and used to select the left hepatic artery. Arteriography was performed. The segment 2 hepatic artery is visualized. The segment 3 hepatic artery bifurcates into a superior and inferior division. The pseudoaneurysm arises from the inferior division of the segment 3 artery. The microcatheter was further advanced into the inferior division. Arteriography again was again performed confirming the location of the arterial injury and pseudoaneurysm. The vessel measures approximately 2.7 mm. First, embolization was attempted using a Medtronic microvascular plug. Unfortunately, the plug could not be advanced due to the high tortuosity of the vessels without backing the catheter out. Therefore, the micro vascular plug was discarded. An initial attempt was made to embolize using a 4 x 15 mm Ruby low-profile detachable coil. However, the coil would not form within the artery without prolapsing into the large pseudoaneurysm sac. Therefore, therefore, this coil was recovered and set aside on the back table. Utilizing an exchange length 0.014 wire, the cantata microcatheter was exchanged for a lantern microcatheter. A 3 mm pod detachable coil was successfully deployed in the inferior division of the segment 3 artery forming the initial coil pack. Subsequent coil embolization was performed utilizing the 4 x 15 mm Ruby low-profile detachable coil, a 30 cm low-profile packing coil and a 30 cm standard  packing coil. This resulted in complete embolization of the inferior and superior divisions of the segment 3 hepatic artery. Follow-up arteriography demonstrates no further opacification of the pseudoaneurysm. The catheters were removed. Hemostasis was attained with the assistance of a Celt arterial closure device. IMPRESSION: 1. Large arterial pseudoaneurysm arising from the inferior division of the segment 3 hepatic artery. There is associated intravasation through the hepatic parenchyma resulting in arteriovenous shunting into the left hepatic vein. 2. Successful coil embolization of the segment 3 hepatic artery with no further flow into the pseudoaneurysm. Electronically Signed   By: Malachy Moan M.D.   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 He presents for emergent angiogram and embolization. EXAM: IR EMBO ART VEN HEMORR LYMPH EXTRAV INC GUIDE ROADMAPPING; SELECTIVE  VISCERAL ARTERIOGRAPHY; IR ULTRASOUND GUIDANCE VASC ACCESS RIGHT MEDICATIONS: None. ANESTHESIA/SEDATION: Moderate (conscious) sedation was employed during this procedure. A total of Versed 1 mg and Fentanyl 25 mcg was administered intravenously. Moderate Sedation Time: 83 minutes. The patient's level of consciousness and vital signs were monitored continuously by radiology nursing throughout the procedure under my direct supervision. CONTRAST:  70 mL Isovue 300 FLUOROSCOPY: Radiation Exposure Index (as provided by the fluoroscopic device): 1,099 mGy Kerma COMPLICATIONS: None immediate. PROCEDURE: Informed consent was obtained from the patient following explanation of the procedure, risks, benefits and alternatives. The patient understands, agrees and consents for the procedure. All questions were addressed. A time out was performed prior to the initiation of the procedure. Maximal barrier  sterile technique utilized including caps, mask, sterile gowns, sterile gloves, large sterile drape, hand hygiene, and Betadine prep. The right common femoral artery was interrogated with ultrasound and found to be widely patent. An image was obtained and stored for the medical record. Local anesthesia was attained by infiltration with 1% lidocaine. A small dermatotomy was made. Under real-time sonographic guidance, the vessel was punctured with a 21 gauge micropuncture needle. Using standard technique, the initial micro needle was exchanged over a 0.018 micro wire for a transitional 4 [place] micro sheath. The micro sheath was then exchanged over a 0.035 wire for a 5 French vascular sheath. A Sos Omni selective catheter was advanced over a Bentson wire and arteriography was performed. There is inadvertent catheterization of the right inferior phrenic artery with arteriogram. No evidence of abnormality. The celiac axis was catheterized and arteriography was performed. Unfortunately, the vessel is very robust and extremely down sloping. The Sos catheter points at a prominent left gastric artery. Purchase is not particularly strong. A longer reverse curve catheter is necessary. Therefore, the Sos catheter was exchanged for a Simmons catheter which was advanced over the aortic bifurcation. The Simmons catheter was used to catheterize the common hepatic artery. A hepatic arteriogram is performed. Hypertrophic and highly tortuous hepatic arteries. Large pseudoaneurysm arising from the segment 3 left hepatic artery measures grossly 5.3 x 3.3 cm. There is intravasation into an drainage through the left hepatic vein. A Cook can tot a microcatheter was advanced over a Fathom wire and used to select the left hepatic artery. Arteriography was performed. The segment 2 hepatic artery is visualized. The segment 3 hepatic artery bifurcates into a superior and inferior division. The pseudoaneurysm arises from the inferior division  of the segment 3 artery. The microcatheter was further advanced into the inferior division. Arteriography again was again performed confirming the location of the arterial injury and pseudoaneurysm. The vessel measures approximately 2.7 mm. First, embolization was attempted using a Medtronic microvascular plug. Unfortunately, the plug could not be advanced due to the high tortuosity of the vessels without backing the catheter out. Therefore, the micro vascular plug was discarded. An initial attempt was made to embolize using a 4 x 15 mm Ruby low-profile detachable coil. However, the coil would not form within the artery without prolapsing into the large pseudoaneurysm sac. Therefore, therefore, this coil was recovered and set aside on the back table. Utilizing an exchange length 0.014 wire, the cantata microcatheter was exchanged for a lantern microcatheter. A 3 mm pod detachable coil was successfully deployed in the inferior division of the segment 3 artery forming the initial coil pack. Subsequent coil embolization was performed utilizing the 4 x 15 mm Ruby low-profile detachable coil, a 30 cm low-profile packing coil and a 30 cm  standard packing coil. This resulted in complete embolization of the inferior and superior divisions of the segment 3 hepatic artery. Follow-up arteriography demonstrates no further opacification of the pseudoaneurysm. The catheters were removed. Hemostasis was attained with the assistance of a Celt arterial closure device. IMPRESSION: 1. Large arterial pseudoaneurysm arising from the inferior division of the segment 3 hepatic artery. There is associated intravasation through the hepatic parenchyma resulting in arteriovenous shunting into the left hepatic vein. 2. Successful coil embolization of the segment 3 hepatic artery with no further flow into the pseudoaneurysm. Electronically Signed   By: Malachy Moan M.D.   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 He presents for emergent angiogram and embolization. EXAM: IR EMBO ART VEN HEMORR LYMPH EXTRAV INC GUIDE ROADMAPPING; SELECTIVE VISCERAL ARTERIOGRAPHY; IR ULTRASOUND GUIDANCE VASC ACCESS RIGHT MEDICATIONS: None. ANESTHESIA/SEDATION: Moderate (conscious) sedation was employed during this procedure. A total of Versed 1 mg and Fentanyl 25 mcg was administered intravenously. Moderate Sedation Time: 83 minutes. The patient's level of consciousness and vital signs were monitored continuously by radiology nursing throughout the procedure under my direct supervision. CONTRAST:  70 mL Isovue 300 FLUOROSCOPY: Radiation Exposure Index (as provided by the fluoroscopic device): 1,099 mGy Kerma COMPLICATIONS: None immediate. PROCEDURE: Informed consent was obtained from the patient following explanation of the procedure, risks, benefits and alternatives. The patient understands, agrees and consents for the procedure. All questions were addressed. A time out was performed prior to the initiation of the procedure. Maximal barrier sterile technique utilized including caps, mask, sterile gowns, sterile gloves, large sterile drape, hand hygiene, and Betadine prep. The right common femoral artery was interrogated with ultrasound and found to be widely patent. An image was obtained and stored for the medical record. Local anesthesia was attained by infiltration with 1% lidocaine. A small dermatotomy was made. Under real-time sonographic guidance, the vessel was punctured with a 21 gauge micropuncture needle. Using standard technique, the initial micro needle was exchanged over a 0.018 micro wire for a transitional 4 [place] micro sheath. The micro sheath was then exchanged over a 0.035 wire for a 5 French vascular sheath. A Sos Omni selective catheter was advanced over a Bentson wire and arteriography was  performed. There is inadvertent catheterization of the right inferior phrenic artery with arteriogram. No evidence of abnormality. The celiac axis was catheterized and arteriography was performed. Unfortunately, the vessel is very robust and extremely down sloping. The Sos catheter points at a prominent left gastric artery. [place] is not particularly strong. A longer reverse curve catheter is necessary. Therefore, the Sos catheter was exchanged for a Simmons catheter which was advanced over the aortic bifurcation. The Simmons catheter was used to catheterize the common hepatic artery. A hepatic arteriogram is performed. Hypertrophic and highly tortuous hepatic arteries. Large pseudoaneurysm arising from the segment 3 left hepatic artery measures grossly 5.3 x 3.3 cm. There is intravasation into an drainage through the left hepatic vein. A Cook can tot a microcatheter was advanced over a Fathom wire and used to select the left hepatic artery. Arteriography was performed. The segment 2 hepatic artery is visualized. The segment 3 hepatic artery bifurcates into a superior and inferior division. The pseudoaneurysm arises from the inferior division of the segment 3 artery. The microcatheter was further advanced into the inferior division. Arteriography again was again performed confirming the location of the arterial injury and pseudoaneurysm. The vessel  measures approximately 2.7 mm. First, embolization was attempted using a Medtronic microvascular plug. Unfortunately, the plug could not be advanced due to the high tortuosity of the vessels without backing the catheter out. Therefore, the micro vascular plug was discarded. An initial attempt was made to embolize using a 4 x 15 mm Ruby low-profile detachable coil. However, the coil would not form within the artery without prolapsing into the large pseudoaneurysm sac. Therefore, therefore, this coil was recovered and set aside on the back table. Utilizing an exchange  length 0.014 wire, the cantata microcatheter was exchanged for a lantern microcatheter. A 3 mm pod detachable coil was successfully deployed in the inferior division of the segment 3 artery forming the initial coil pack. Subsequent coil embolization was performed utilizing the 4 x 15 mm Ruby low-profile detachable coil, a 30 cm low-profile packing coil and a 30 cm standard packing coil. This resulted in complete embolization of the inferior and superior divisions of the segment 3 hepatic artery. Follow-up arteriography demonstrates no further opacification of the pseudoaneurysm. The catheters were removed. Hemostasis was attained with the assistance of a Celt arterial closure device. IMPRESSION: 1. Large arterial pseudoaneurysm arising from the inferior division of the segment 3 hepatic artery. There is associated intravasation through the hepatic parenchyma resulting in arteriovenous shunting into the left hepatic vein. 2. Successful coil embolization of the segment 3 hepatic artery with no further flow into the pseudoaneurysm. Electronically Signed   By: Malachy Moan M.D.   On: 02/09/2023 21:32   CT Angio Chest/Abd/Pel for Dissection W and/or Wo Contrast  Result Date: 02/09/2023 CLINICAL DATA:  Acute  aortic syndrome suspected. EXAM: CT ANGIOGRAPHY CHEST, ABDOMEN AND PELVIS TECHNIQUE: Non-contrast CT of the chest was initially obtained. Multidetector CT imaging through the chest, abdomen and pelvis was performed using the standard protocol during bolus administration of intravenous contrast. Multiplanar reconstructed images and MIPs were obtained and reviewed to evaluate the vascular anatomy. RADIATION DOSE REDUCTION: This exam was performed according to the departmental dose-optimization program which includes automated exposure control, adjustment of the mA and/or kV according to patient size and/or use of iterative reconstruction technique. CONTRAST:  75mL OMNIPAQUE IOHEXOL 350 MG/ML SOLN COMPARISON:   MRI abdomen 12/03/2021, noncontrast CT 12/05/2022, ultrasound 02/09/2023 FINDINGS: CTA CHEST FINDINGS Cardiovascular: Noncontrast series demonstrates no intramural hematoma thoracic aorta. Contrast series demonstrates no aortic dissection or aneurysm. Mediastinum/Nodes: No axillary or supraclavicular adenopathy. No mediastinal or hilar adenopathy. No pericardial fluid. Esophagus normal. Lungs/Pleura: No suspicious pulmonary nodules. Normal pleural. Airways normal. Musculoskeletal: No aggressive osseous lesion. Review of the MIP images confirms the above findings. CTA ABDOMEN AND PELVIS FINDINGS VASCULAR Aorta: Normal caliber aorta without aneurysm, dissection, vasculitis or significant stenosis. Celiac: Patent without evidence of aneurysm, dissection, vasculitis or significant stenosis. SMA: Patent without evidence of aneurysm, dissection, vasculitis or significant stenosis. Renals: Both renal arteries are patent without evidence of aneurysm, dissection, vasculitis, fibromuscular dysplasia or significant stenosis. IMA: Patent without evidence of aneurysm, dissection, vasculitis or significant stenosis. Inflow: Patent without evidence of aneurysm, dissection, vasculitis or significant stenosis. Veins: No obvious venous abnormality within the limitations of this arterial phase study. Review of the MIP images confirms the above findings. NON-VASCULAR Hepatobiliary: There is an early enhancing vascular lesion within the lateral segment LEFT hepatic lobe (segment 2 and segment 3). Rounded vascular structure measuring 3.7 by 3.3 cm in total dimension (image 179/7). 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 No evidence of fistula on noncontrast MRI December 03, 2021 or noncontrast CT 12/05/2022. Pancreas: Pancreas is normal. No ductal dilatation. No pancreatic inflammation. Spleen: Normal  spleen Adrenals/urinary tract: Adrenal glands and kidneys are normal. The ureters and bladder normal. Stomach/Bowel: Stomach, small bowel, appendix, and cecum are normal. The colon and rectosigmoid colon are normal. Vascular/Lymphatic: No lymphadenopathy Reproductive: Prostate unremarkable Other: No free fluid. Musculoskeletal: No aggressive osseous lesion. Review of the MIP images confirms the above findings. IMPRESSION: CHEST IMPRESSION: 1. No evidence of aortic dissection or aneurysm. 2. No acute pulmonary parenchymal findings. PELVIS IMPRESSION: 1. No evidence of aortic dissection or aneurysm. 2. No acute findings in the abdomen pelvis. 3. 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 Recommend non emergent interventional radiology consultation. Dedicated ultrasound evaluation the lateral segment LEFT hepatic lobe may be valuable. Lesion appears new from MRI 12/03/2021 (noncontrast). Electronically Signed   By: Genevive Bi M.D.   On: 02/09/2023 15:42    Pending Labs Unresulted Labs (From admission, onward)     Start     Ordered   02/11/23 0534  Culture, blood (routine x 2)  BLOOD CULTURE X 2,   R      02/11/23 0533            Vitals/Pain Today's Vitals   02/11/23 1215 02/11/23 1230 02/11/23 1245 02/11/23 1300  BP:    (!) 149/87  Pulse:    85  Resp: (!) 31 (!) 32 (!) 31 (!) 32  Temp:      TempSrc:      SpO2:    92%  Weight:      Height:      PainSc:        Isolation Precautions Airborne and Contact precautions  Medications Medications  fentaNYL (SUBLIMAZE) injection 50 mcg (50 mcg Intravenous Given 02/11/23 0553)  acetaminophen (TYLENOL) tablet 1,000 mg (1,000 mg Oral Given 02/11/23 0608)  iohexol (OMNIPAQUE) 350 MG/ML injection 100 mL (60 mLs Intravenous Contrast Given 02/11/23 0614)  metroNIDAZOLE (FLAGYL) IVPB 500 mg (0 mg Intravenous Stopped 02/11/23 0749)  vancomycin (VANCOCIN) IVPB 1000  mg/200 mL premix (0 mg Intravenous Stopped 02/11/23 0833)  ceFEPIme (MAXIPIME) 2 g in sodium chloride 0.9 % 100 mL IVPB (0 g Intravenous Stopped 02/11/23 0727)  sodium zirconium cyclosilicate (LOKELMA) packet 10 g (10 g Oral Given 02/11/23 0905)  calcium gluconate inj 10% (1 g) URGENT USE ONLY! (1 g Intravenous Given 02/11/23 0904)  sodium bicarbonate injection 50 mEq (50 mEq Intravenous Given 02/11/23 0905)  fentaNYL (SUBLIMAZE) injection 50 mcg (50 mcg Intravenous Given 02/11/23 0921)    Mobility walks     Focused Assessments Pulmonary Assessment Handoff:  Lung sounds: Bilateral Breath Sounds: Clear O2 Device: Room Air O2 Flow Rate (L/min): 2 L/min    R Recommendations: See Admitting Provider Note  Report given to:   Additional Notes:

## 2023-02-11 NOTE — Sepsis Progress Note (Signed)
[facility] monitoring for the code sepsis protocol.  

## 2023-02-11 NOTE — Progress Notes (Signed)
Received patient in bed to unit. yes Alert and oriented X 4 Informed consent signed and in chart. yes  TX duration: 3.5 hrs  Patient tolerated well.  Transported back to the room yes Alert, without acute distress.  Hand-off given to patient's nurse. [name] RN  Access used: left fore arm A/V graft. Access issues: none  Total UF removed: 2.5 Medication(s) given: none Post HD VS: 177/88 hr 98 Post HD weight: 62kg   [facility]

## 2023-02-11 NOTE — H&P (Addendum)
TRH H&P   Patient Demographics:    Roy Randolph, is a 47 y.o. male  MRN: [id number]   DOB - April 14, 1976  Admit Date - 02/11/2023  Outpatient Primary MD for the patient is Marcine Matar, MD  Patient coming from: [facility] ER  Chief Complaint  Patient presents with   Abdominal Pain      HPI:    Roy Randolph  is a 47 y.o. 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 Archer Asa on 02/09/2023, thereafter he went to ICU but soon thereafter he signed out AMA.  He missed his dialysis last outpatient dialysis as well.  He presented today few hours ago at [facility] with sudden onset excruciating abdominal pain which is located in his left upper quadrant radiating to his back, associated with high fevers, no aggravating or relieving factors, CT scan reveals bloody ascites, he also had leukocytosis and a temp of 101.  He was accepted as direct admission where I am seeing him.  Patient currently in bed appears visibly uncomfortable due to abdominal pain, he does not have headache but agrees to low-grade fevers, no chest pain, currently no shortness of breath, abdominal pain 10 out of 10 as above, no blood in stool or urine no dysuria, no weakness tingling or numbness.  He is he wants to get better and will be compliant with hospital stay at this time.  He does not want to answer many questions and with close his eyes and  turn his face away after a few yes or no answers.   Review of systems:    A full 10 point Review of Systems was done, except as stated above, all other Review of Systems were negative.   With Past History of the following :    Past Medical History:  Diagnosis Date   Abnormal EKG 10/10/2021   Alcohol use disorder in remission 11/14/2021   Elevated troponin 10/10/2021   ESRD on hemodialysis (HCC) 10/10/2021   History of migraine 11/14/2021   Hypertension    Hypertensive emergency due to non compliance  10/10/2021   Hypokalemia 10/10/2021   Left arm numbness and pain 10/10/2021   Marijuana use 10/10/2021   Noncompliance    NSAID long-term use 10/10/2021   PRES (posterior reversible encephalopathy syndrome)    Smoker 10/10/2021   Syncope, vasovagal 10/10/2021   Thrombocytopenia (HCC) 10/10/2021   Vision disturbance 11/14/2021      Past Surgical History:  Procedure Laterality Date   AV FISTULA PLACEMENT Left 01/01/2022   Procedure: LEFT ARM BRACHIOCEPHALIC ARTERIOVENOUS  FISTULA CREATION;  Surgeon: Maeola Harman, MD;  Location: [facility] OR;  Service: Vascular;  Laterality: Left;   IR ANGIOGRAM VISCERAL SELECTIVE  02/09/2023   IR ANGIOGRAM VISCERAL SELECTIVE  02/09/2023   IR ANGIOGRAM VISCERAL SELECTIVE  02/09/2023   IR EMBO ART  VEN HEMORR LYMPH EXTRAV  INC GUIDE ROADMAPPING  02/09/2023   IR FLUORO GUIDE CV LINE RIGHT  12/24/2021   IR FLUORO GUIDE CV LINE RIGHT  12/27/2021   IR US GUIDE VASC ACCESS RIGHT  12/24/2021   IR US GUIDE VASC ACCESS RIGHT  02/09/2023   THORACOTOMY Right       Social History:     Social History   Tobacco Use   Smoking status: Some Days    Packs/day: .3    Types: Cigarettes    Start date: 1998   Smokeless tobacco: Not on file   Tobacco comments:    Stopped in January  Substance Use Topics   Alcohol use: Not Currently    Alcohol/week: 12.0 standard drinks of alcohol    Types: 12 Cans of beer per week    Comment: stopped heavy drinking 2021          Family History :     Family History  Problem Relation Age of Onset   Hypertension Mother    Cervical cancer Mother    Breast cancer Sister    Sudden Cardiac Death Brother 73   Heart disease Maternal Grandmother    Cancer Maternal Grandmother        Home Medications:   Prior to Admission medications   Medication Sig Start Date End Date Taking? Authorizing Provider  RENVELA 800 MG tablet Take 800 mg by mouth 3 (three) times daily. 01/14/23  Yes [provider]  acetaminophen (TYLENOL) 325 MG tablet Take 2 tablets (650 mg total) by mouth every 6 (six) hours as needed for fever or mild pain. 01/02/22   Katsadouros, Vasilios, MD  amLODipine (NORVASC) 10 MG tablet Take 1 tablet (10 mg total) by mouth daily. 02/15/22   Ellison Carwin, MD  calcium acetate (PHOSLO) 667 MG capsule Take 1 capsule (667 mg total) by mouth 3 (three) times daily with meals. Patient not taking: Reported on 02/09/2023 01/18/22   Dellia Cloud, MD  cloNIDine (CATAPRES) 0.3 MG tablet Take 1 tablet (0.3 mg total) by mouth 3 (three) times daily. 12/05/22   Jacalyn Lefevre, MD  Darbepoetin Alfa (ARANESP) 300 MCG/0.6ML SOSY injection Inject 0.6 mLs (300 mcg total) into the vein every dialysis (Tu,Th,Sat). 01/02/22   Katsadouros, Vasilios, MD  labetalol (NORMODYNE) 200 MG tablet Take 2 tablets (400 mg total) by mouth 2 (two) times daily. 02/15/22   Ellison Carwin, MD  mirtazapine (REMERON) 7.5 MG tablet Take 1 tablet (7.5 mg total) by mouth at bedtime. Start after weaned off Zoloft. 12/12/22   Marcine Matar, MD  multivitamin (RENA-VIT) TABS tablet Take 1 tablet by mouth daily.    [provider]  pantoprazole (PROTONIX) 40 MG tablet Take 1 tablet (40 mg total)  by mouth daily. 02/15/22   Ellison Carwin, MD  sertraline (ZOLOFT) 50 MG tablet Take 1 tablet (50 mg total) by mouth daily. Patient not taking: Reported on 02/09/2023 02/15/22   Ellison Carwin, MD  torsemide (DEMADEX) 100 MG tablet Take 100 mg by mouth  daily. Take after dialysis on dialysis days    [provider]  Vitamin D, Ergocalciferol, (DRISDOL) 1.25 MG (50000 UNIT) CAPS capsule Take 1 capsule (50,000 Units total) by mouth once a week. Patient not taking: Reported on 02/09/2023 02/07/22   Virgina Norfolk, PA     Allergies:     Allergies  Allergen Reactions   Cucumber Extract Anaphylaxis and Itching   Other Anaphylaxis, Itching, Swelling and Other (See Comments)    The patient CANNOT eat raw/fresh fruit or vegetables. Reaction starts with ears itching, then progresses from there. He can eat canned versions of these foods, however.   Tomato (Diagnostic) Anaphylaxis and Swelling   Wild Lettuce [Wild Lettuce Extract (Lactuca Virosa)] Anaphylaxis and Swelling   Hydralazine Hives, Itching and Other (See Comments)    SEVERE ITCHING     Physical Exam:   Vitals  Blood pressure (!) 175/103, pulse 86, temperature 98.4 F (36.9 C), temperature source Oral, resp. rate 20, height 5\' 9"  (1.753 m), weight 65.8 kg, SpO2 96 %.   1. General middle-aged thin African-American male lying in hospital bed in moderate abdominal pain, does not want to answer many questions and not very compliant with exam,  2. Normal affect and insight, Not Suicidal or Homicidal, Awake Alert,   3. No F.N deficits, ALL C.Nerves Intact, Strength 5/5 all 4 extremities, Sensation intact all 4 extremities, Plantars down going.  4. Ears and Eyes appear Normal, Conjunctivae clear, PERRLA. Moist Oral Mucosa.  5. Supple Neck, No JVD, No cervical lymphadenopathy appriciated, No Carotid Bruits.  6. Symmetrical Chest wall movement, Good air movement bilaterally, CTAB.  7. RRR, No Gallops, Rubs or Murmurs, No Parasternal Heave.  8.  Proactive bowel sounds, abdomen is distended and diffusely tender mostly in the left upper quadrant, he does have some guarding and rebound  9.  No Cyanosis, Normal Skin Turgor, No Skin Rash or Bruise.  10. Good muscle tone,   joints appear normal , no effusions, Normal ROM.  Left arm AV fistula noted  11. No Palpable Lymph Nodes in Neck or Axillae      Data Review:   Recent Labs  Lab 02/09/23 0950 02/09/23 1720 02/10/23 0213 02/10/23 1122 02/11/23 0547  WBC 8.0 7.9 11.0* 19.7* 28.4*  HGB 11.3* 10.4* 10.9* 10.9* 10.0*  HCT 35.9* 33.2* 33.2* 33.8* 29.3*  PLT 176 161 203 183 147*  MCV 92.8 92.2 90.0 91.4 88.0  MCH 29.2 28.9 29.5 29.5 30.0  MCHC 31.5 31.3 32.8 32.2 34.1  RDW 17.8* 17.5* 17.6* 17.4* 17.2*  LYMPHSABS  --   --   --   --  0.8  MONOABS  --   --   --   --  3.7*  EOSABS  --   --   --   --  0.0  BASOSABS  --   --   --   --  0.1    Recent Labs  Lab 02/09/23 0950 02/09/23 1052 02/09/23 1720 02/10/23 0213 02/11/23 0547 02/11/23 0850  NA 136  --   --  132* 128*  --   K 4.6  --   --  5.2* 5.9*  --   CL 99  --   --  96* 94*  --   CO2 24  --   --  21* 19*  --   ANIONGAP 13  --   --  15 15  --   GLUCOSE 130*  --   --  121* 88  --   BUN 37*  --   --  47* 71*  --   CREATININE 5.66*  --   --  7.09* 9.90*  --   AST  --  46*  --   --  201*  --   ALT  --  25  --   --  116*  --   ALKPHOS  --  172*  --   --  102  --   BILITOT  --  1.3*  --   --  1.6*  --   ALBUMIN  --  3.5  --   --  3.6  --   LATICACIDVEN  --   --   --   --  1.6 1.3  INR  --   --  1.2  --   --  1.6*  MG  --   --   --  2.2  --   --   CALCIUM 9.7  --   --  9.7 9.6  --     Lab Results  Component Value Date   TRIG 370 (H) 12/02/2021    Recent Labs  Lab 02/09/23 0950 02/09/23 1720 02/10/23 0213 02/11/23 0547 02/11/23 0850  LATICACIDVEN  --   --   --  1.6 1.3  INR  --  1.2  --   --  1.6*  MG  --   --  2.2  --   --   CALCIUM 9.7  --  9.7 9.6  --     Recent Labs  Lab 02/09/23 0950 02/09/23 1720 02/10/23 0213 02/10/23 1122 02/11/23 0547 02/11/23 0850  WBC 8.0 7.9 11.0* 19.7* 28.4*  --   PLT 176 161 203 183 147*  --   LATICACIDVEN  --   --   --   --  1.6 1.3  CREATININE 5.66*  --  7.09*  --  9.90*  --      Urinalysis    Component Value Date/Time   COLORURINE YELLOW 02/11/2023 0650   APPEARANCEUR HAZY (A) 02/11/2023 0650   APPEARANCEUR Clear 12/26/2022 1022   LABSPEC 1.033 (H) 02/11/2023 0650   PHURINE 5.5 02/11/2023 0650   GLUCOSEU NEGATIVE 02/11/2023 0650   HGBUR TRACE (A) 02/11/2023 0650   BILIRUBINUR NEGATIVE 02/11/2023 0650   BILIRUBINUR Negative 12/26/2022 1022   KETONESUR NEGATIVE 02/11/2023 0650   PROTEINUR 100 (A) 02/11/2023 0650   NITRITE NEGATIVE 02/11/2023 0650   LEUKOCYTESUR NEGATIVE 02/11/2023 0650      Imaging Results:    CT Angio Chest/Abd/Pel for Dissection W and/or Wo Contrast  Result Date: 02/11/2023 CLINICAL DATA:  47 year old male with history of worsening pain, fever and hypoxia. Recent history of hepatic arteriovenous malformation coil procedure. EXAM: CT ANGIOGRAPHY CHEST, ABDOMEN AND PELVIS TECHNIQUE: Non-contrast CT of the chest was initially obtained. Multidetector CT imaging through the chest, abdomen and pelvis was performed using the standard protocol during bolus administration of intravenous contrast. Multiplanar reconstructed images and MIPs were obtained and reviewed to evaluate the vascular anatomy. RADIATION DOSE REDUCTION: This exam was performed according to the departmental dose-optimization program which includes automated exposure control, adjustment of the mA and/or kV according to patient size and/or use of iterative reconstruction technique. CONTRAST:  60mL OMNIPAQUE IOHEXOL 350  MG/ML SOLN COMPARISON:  CTA of the chest, abdomen and pelvis 02/09/2023. FINDINGS: CTA CHEST FINDINGS Cardiovascular: Heart size is mildly enlarged. There is no significant pericardial fluid, thickening or pericardial calcification. No acute abnormality of the thoracic aorta or the great vessels of the mediastinum. Ascending thoracic aorta, mid aortic arch and descending thoracic aorta measure 3.2 cm, 3.0 cm and 2.3 cm in diameter respectively. No evidence of thoracic  aortic dissection. Mediastinum/Nodes: Multiple prominent borderline enlarged mediastinal and bilateral hilar lymph nodes measuring up to 1.4 cm in short axis in the subcarinal nodal station, nonspecific, but similar to the prior study, and favored to be benign. Esophagus is unremarkable in appearance. No axillary lymphadenopathy. Lungs/Pleura: Dependent opacities are noted in the lungs bilaterally (left-greater-than-right), which appear predominantly atelectatic, although less well-defined areas of ground-glass attenuation and airspace consolidation are noted in the extreme lung bases, which may suggest areas of infectious consolidation and inflammation. No pleural effusions. Pleural nodularity is noted in the right hemithorax, best appreciated on axial images 48 and 49 of series 4, where the largest pleural nodule measures 1.7 x 1.6 cm (40 HU). No left-sided pleural nodularity is noted. Mild diffuse bronchial wall thickening with mild centrilobular and paraseptal emphysema. No definite suspicious appearing pulmonary nodules or masses are noted. Musculoskeletal: There are no aggressive appearing lytic or blastic lesions noted in the visualized portions of the skeleton. Review of the MIP images confirms the above findings. CTA ABDOMEN AND PELVIS FINDINGS VASCULAR Aorta: Aortic atherosclerosis. Normal caliber aorta without aneurysm, dissection, vasculitis or significant stenosis. Celiac: Embolization coils are noted in the region of the of the left hepatic artery. Celiac axis and branches are otherwise without evidence of aneurysm, dissection, vasculitis or significant stenosis. SMA: Patent without evidence of aneurysm, dissection, vasculitis or significant stenosis. Renals: Both renal arteries are patent without evidence of aneurysm, dissection, vasculitis, fibromuscular dysplasia or significant stenosis. IMA: Patent without evidence of aneurysm, dissection, vasculitis or significant stenosis. Inflow: Patent without  evidence of significant stenosis. Mild aneurysmal dilatation of the right common iliac artery (1.7 cm in diameter). Short segment dissection of the distal left common iliac artery which propagates into the left external and internal iliac arteries over a very short distance, and is associated with mild aneurysmal dilatation of the left internal iliac artery (axial image 231 of series 4) measuring 1.1 cm in diameter, similar to the prior study. Veins: No obvious venous abnormality within the limitations of this arterial phase study. Review of the MIP images confirms the above findings. NON-VASCULAR Hepatobiliary: Mild enlargement and heterogeneous attenuation in the left lobe of the liver predominantly in segment 3 related to prior intrahepatic hemorrhage and subsequent embolization. Perfusion throughout this region appears slightly diminished, likely secondary to embolization of branches of the left hepatic artery, without definitive evidence of persistent perfusion of the previously embolized pseudoaneurysm. No well organized fluid collection is confidently identified at this time. Liver is otherwise diffusely enlarged, but no focal hepatic lesions are noted. No intra or extrahepatic biliary ductal dilatation. High attenuation material within the lumen of the gallbladder is compatible with vicarious excretion of contrast material related to prior contrast-enhanced CT examinations and embolization procedure. Pancreas: No pancreatic mass. No pancreatic ductal dilatation. No pancreatic or peripancreatic fluid collections or inflammatory changes. Spleen: Unremarkable. Adrenals/Urinary Tract: Bilateral kidneys and adrenal glands are normal in appearance. No hydroureteronephrosis. Urinary bladder is nearly completely decompressed and otherwise unremarkable in appearance. Stomach/Bowel: The appearance of the stomach is normal. There is no pathologic dilatation of small  bowel or colon. Normal appendix. Lymphatic: No  lymphadenopathy noted in the abdomen or pelvis. Reproductive: Prostate gland is unremarkable in appearance. Other: Small to moderate volume of ascites, most evident adjacent to the liver and in the low anatomic pelvis. This is intermediate to high attenuation, indicative of internal blood products. Musculoskeletal: There are no aggressive appearing lytic or blastic lesions noted in the visualized portions of the skeleton. Review of the MIP images confirms the above findings. IMPRESSION: 1. 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 2. Small to moderate volume of intermediate to high attenuation ascites in the peritoneal cavity reflecting the presence of blood products within the ascites. No well organized fluid collection identified to suggest abscess in the abdomen or pelvis. 3. Dependent opacities in the lung bases predominantly atelectatic, although some areas of airspace consolidation are evident, which could reflect resolving infection or sequela of aspiration. 4. Pleural nodularity in the posterior aspect of the right hemithorax. This is of uncertain etiology and significance, but the possibility of malignancy is not entirely excluded. Follow-up contrast-enhanced chest CT is recommended in 2 months to re-evaluate this finding and ensure the stability or regression. At the time of the follow-up imaging, attention to multiple borderline enlarged mediastinal and bilateral hilar lymph nodes is also suggested to ensure stability or regression. 5. Cardiomegaly. 6. Aortic atherosclerosis with mild aneurysmal dilatation of the right common iliac artery, and short segment dissection of the distal left common iliac artery which propagates into the proximal aspects of the left external and internal iliac arteries, with mild aneurysmal dilatation of the proximal left internal iliac artery, similar to the  prior study, as above. 7. Mild diffuse bronchial wall thickening with mild centrilobular and paraseptal emphysema; imaging findings suggestive of underlying COPD. Electronically Signed   By: Trudie Reed M.D.   On: 02/11/2023 06:59   DG Chest 1 View  Result Date: 02/11/2023 CLINICAL DATA:  Shortness of breath, fever and hypoxia. 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 EXAM: CHEST  1 VIEW COMPARISON:  CTA chest 02/09/2023 FINDINGS: There is mild cardiomegaly. Vascular markings are normal caliber. The lungs show chronic changes and linear platelike basilar atelectasis without appreciable infiltrates. A single surgical clip superimposes in the right upper to mid lung field. The mediastinum is normally outlined. The thoracic cage is intact.  There is overlying monitor wiring. Compare: Overall aeration seems unchanged. IMPRESSION: Chronic lung changes with no evidence of acute chest process. Mild cardiomegaly. Electronically Signed   By: Almira Bar M.D.   On: 02/11/2023 06:12   My personal review of EKG: Rhythm NSR, 82 bpm with LVH phenomenon   Assessment & Plan:   Abdominal pain, bloody ascites, with possible early sepsis, postembolization liver necrosis in a patient who underwent hepatic artery aneurysm embolization 2 days ago by IR. Case discussed with IR physician Dr. Archer Asa and Dr. 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 except medications, gentle IV fluids as he is ESRD patient, IR and general surgery both have been consulted.  Obtain blood cultures and place him on empiric Levaquin and Flagyl, fluoroquinolone suggested by IR for better gallbladder and liver penetration.  2.  ESRD.  Missed last dialysis.  Nephrology consulted.  HD today.  3.  Hypertension.  In poor control.  Home medications resumed along with as needed IV hydralazine.  Pain  control.  4.  Smoking, alcohol abuse,  medication noncompliance, signing out AMA in the past.  Strictly counseled.  5.  Anemia of chronic disease.  Monitor.   DVT Prophylaxis SCDs  AM Labs Ordered, also please review Full Orders  Family Communication: Admission, patients condition and plan of care including tests being ordered have been discussed with the patient who indicates understanding and agree with the plan and Code Status.  Code Status Full  Likely DC to  TND  Condition GUARDED    Consults called: General surgery Dr. Ileana Roup, IR, nephrology Dr. Malen Gauze  Admission status: Inpatient  Time spent in minutes : 55  Signature  -    Susa Raring M.D on 02/11/2023 at 4:37 PM   -  To page go to [email or web address]

## 2023-02-11 NOTE — ED Notes (Signed)
Called [facility] to transport patient to [facility] 5W rm# [id number]

## 2023-02-11 NOTE — Progress Notes (Signed)
Asked to see pt for dialysis. Pt presented w/ abd pain, unable to walk/ stand due to pain. Had IR procedure to his liver the night before last. In ED pt was febrile, but HR and BP were normal. Some ^RR and hypoxia. WBC 28K. CXR showing chronic lung changes with no evidence of acute chest process and mild cardiomegaly. Pt is admitted. We are asked to see for dialysis. Pt seen in room, no distress, hot to touch. Pleuritic chest pains but no sig SOB per the pt. CXR w/o gross edema. Weighed him at 67.4kg, just 1 kg up. Missed HD yest and today, plan HD tonight. Full consult note to follow.    OP HD: [facility]  4h   400/600  66.5kg  3K/ 2.5 bath AVF   Heparin 2500 - last HD 5/11 (Sat), post wt 66.1kg - missed HD yesterday 5/13 - hectorol 2 mcg IV tiw - venofer 50mg  IV weekly - mircera 40 mcg IV q 2wks, last 5/02, due 5/16   Vinson Moselle, MD 02/11/2023, 4:33 PM  Recent Labs  Lab 02/09/23 1052 02/09/23 1720 02/10/23 0213 02/10/23 1122 02/11/23 0547  HGB  --    < > 10.9* 10.9* 10.0*  ALBUMIN 3.5  --   --   --  3.6  CALCIUM  --   --  9.7  --  9.6  PHOS  --   --  7.2*  --   --   CREATININE  --   --  7.09*  --  9.90*  K  --   --  5.2*  --  5.9*   < > = values in this interval not displayed.    Inpatient medications:  amLODipine  10 mg Oral Daily   cloNIDine  0.3 mg Oral TID   labetalol  400 mg Oral BID   multivitamin  1 tablet Oral Daily   pantoprazole  40 mg Oral Daily   sevelamer carbonate  800 mg Oral TID    levofloxacin (LEVAQUIN) IV     metronidazole     hydrALAZINE, HYDROmorphone (DILAUDID) injection

## 2023-02-11 NOTE — Consult Note (Signed)
Renal Service Consult Note [facility]  Cliford L Husby 02/11/2023 Maree Krabbe, MD Requesting Physician: Dr. Thedore Mins  Reason for Consult: ESRD pt w/ new onset fevers HPI: The patient is a 47 y.o. year-old w/ PMH as below who presented w/ abd pain, unable to walk/ stand due to pain. Pt was here for acute hepatic bleed and required an IR procedure to his liver the night before last. He left AMA the next day (yesterday). In ED pt was febrile, but HR and BP were normal. Some ^RR and hypoxia. WBC 28K. CXR showing chronic lung changes with no evidence of acute chest process and mild cardiomegaly. Pt was admitted. We are asked to see for dialysis.   Pt seen in room, no distress. Pleuritic chest pains but no sig SOB per the pt. CXR w/o gross edema. Weighed him at 67.4kg, just 1 kg up. Missed HD yest and today.   ROS - denies CP, no joint pain, no HA, no blurry vision, no rash, no diarrhea, no nausea/ vomiting, no dysuria, no difficulty voiding   Past Medical History  Past Medical History:  Diagnosis Date   Abnormal EKG 10/10/2021   Alcohol use disorder in remission 11/14/2021   Elevated troponin 10/10/2021   ESRD on hemodialysis (HCC) 10/10/2021   History of migraine 11/14/2021   Hypertension    Hypertensive emergency due to non compliance 10/10/2021   Hypokalemia 10/10/2021   Left arm numbness and pain 10/10/2021   Marijuana use 10/10/2021   Noncompliance    NSAID long-term use 10/10/2021   PRES (posterior reversible encephalopathy syndrome)    Smoker 10/10/2021   Syncope, vasovagal 10/10/2021   Thrombocytopenia (HCC) 10/10/2021   Vision disturbance 11/14/2021   Past Surgical History  Past Surgical History:  Procedure Laterality Date   AV FISTULA PLACEMENT Left 01/01/2022   Procedure: LEFT ARM BRACHIOCEPHALIC ARTERIOVENOUS  FISTULA CREATION;  Surgeon: Maeola Harman, MD;  Location: [facility] OR;  Service: Vascular;  Laterality: Left;   IR ANGIOGRAM VISCERAL  SELECTIVE  02/09/2023   IR ANGIOGRAM VISCERAL SELECTIVE  02/09/2023   IR ANGIOGRAM VISCERAL SELECTIVE  02/09/2023   IR EMBO ART  VEN HEMORR LYMPH EXTRAV  INC GUIDE ROADMAPPING  02/09/2023   IR FLUORO GUIDE CV LINE RIGHT  12/24/2021   IR FLUORO GUIDE CV LINE RIGHT  12/27/2021   IR US GUIDE VASC ACCESS RIGHT  12/24/2021   IR US GUIDE VASC ACCESS RIGHT  02/09/2023   THORACOTOMY Right    Family History  Family History  Problem Relation Age of Onset   Hypertension Mother    Cervical cancer Mother    Breast cancer Sister    Sudden Cardiac Death Brother 31   Heart disease Maternal Grandmother    Cancer Maternal Grandmother    Social History  reports that he has been smoking cigarettes. He started smoking about 26 years ago. He has been smoking an average of .3 packs per day. He does not have any smokeless tobacco history on file. He reports that he does not currently use alcohol after a past usage of about 12.0 standard drinks of alcohol per week. He reports current drug use. Drug: Marijuana. Allergies  Allergies  Allergen Reactions   Cucumber Extract Anaphylaxis and Itching   Other Anaphylaxis, Itching, Swelling and Other (See Comments)    The patient CANNOT eat raw/fresh fruit or vegetables. Reaction starts with ears itching, then progresses from there. He can eat canned versions of these foods, however.   Tomato (Diagnostic)  Anaphylaxis and Swelling   Wild Lettuce [Wild Lettuce Extract (Lactuca Virosa)] Anaphylaxis and Swelling   Hydralazine Hives, Itching and Other (See Comments)    SEVERE ITCHING   Home medications Prior to Admission medications   Medication Sig Start Date End Date Taking? Authorizing Provider  RENVELA 800 MG tablet Take 800 mg by mouth 3 (three) times daily. 01/14/23  Yes [provider]  acetaminophen (TYLENOL) 325 MG tablet Take 2 tablets (650 mg total) by mouth every 6 (six) hours as needed for fever or mild pain. 01/02/22   Katsadouros, Vasilios, MD  amLODipine  (NORVASC) 10 MG tablet Take 1 tablet (10 mg total) by mouth daily. 02/15/22   Ellison Carwin, MD  calcium acetate (PHOSLO) 667 MG capsule Take 1 capsule (667 mg total) by mouth 3 (three) times daily with meals. Patient not taking: Reported on 02/09/2023 01/18/22   Dellia Cloud, MD  cloNIDine (CATAPRES) 0.3 MG tablet Take 1 tablet (0.3 mg total) by mouth 3 (three) times daily. 12/05/22   Jacalyn Lefevre, MD  Darbepoetin Alfa (ARANESP) 300 MCG/0.6ML SOSY injection Inject 0.6 mLs (300 mcg total) into the vein every dialysis (Tu,Th,Sat). 01/02/22   Katsadouros, Vasilios, MD  labetalol (NORMODYNE) 200 MG tablet Take 2 tablets (400 mg total) by mouth 2 (two) times daily. 02/15/22   Ellison Carwin, MD  mirtazapine (REMERON) 7.5 MG tablet Take 1 tablet (7.5 mg total) by mouth at bedtime. Start after weaned off Zoloft. 12/12/22   Marcine Matar, MD  multivitamin (RENA-VIT) TABS tablet Take 1 tablet by mouth daily.    [provider]  pantoprazole (PROTONIX) 40 MG tablet Take 1 tablet (40 mg total) by mouth daily. 02/15/22   Ellison Carwin, MD  sertraline (ZOLOFT) 50 MG tablet Take 1 tablet (50 mg total) by mouth daily. Patient not taking: Reported on 02/09/2023 02/15/22   Ellison Carwin, MD  torsemide (DEMADEX) 100 MG tablet Take 100 mg by mouth daily. Take after dialysis on dialysis days    [provider]  Vitamin D, Ergocalciferol, (DRISDOL) 1.25 MG (50000 UNIT) CAPS capsule Take 1 capsule (50,000 Units total) by mouth once a week. Patient not taking: Reported on 02/09/2023 02/07/22   Penninger, [place], [place]     Vitals:   02/11/23 1700 02/11/23 1831 02/11/23 1904 02/11/23 1927  BP:  (!) 160/88 (!) 163/92 (!) 143/81  Pulse:  (!) 39 81 81  Resp:  (!) 30 (!) 38 (!) 23  Temp: 98.9 F (37.2 C) 99 F (37.2 C)    TempSrc: Oral     SpO2:  97% 100% 100%  Weight:  66.8 kg    Height:       Exam Gen alert, no distress, [place] O2 No rash, cyanosis or gangrene Sclera anicteric, throat clear  No jvd or  bruits Chest clear bilat to bases, no rales/ wheezing RRR no MRG Abd soft ntnd no mass or ascites +bs GU normal male MS no joint effusions or deformity Ext no LE or UE edema, no wounds or ulcers Neuro is alert, Ox 3 , nf    LUA AVF+bruit     Home meds include - renvela 800 tid, norvasc 10, phoslo 667 mg ac tid, clonidine 0.3 tid, labetalol 400 bid, remeron, renavit, protonix, zoloft, demadex, prns/ vits/ supps      OP HD: [facility]  4h   400/600  66.5kg  3K/ 2.5 bath AVF   Heparin 2500 - last HD 5/11 (Sat), post wt 66.1kg - missed HD yesterday 5/13 -  hectorol 2 mcg IV tiw - venofer 50mg  IV weekly - mircera 40 mcg IV q 2wks, last 5/02, due 5/16     Assessment/ Plan: Abd pain/ fevers/ bloody ascites by CT - suspected liver necrosis s/p embolization of hepatic artery branch 2 days ago. +fevers, will get empiric IV abx , blood cx's, gentle IVF"s given esrd status.  ESRD - on HD 4x/ week, missed Mon and Tues this week. Plan HD this evening.  HTN/ volume - BP's a bit on the high side, not severe. On multiple HTN medications. Not grossly vol overloaded. Close to dry wt. Small UF goal w/ HD.  Anemia esrd - Hb 10-11 here, next esa due 5/16. Follow.  MBD ckd - CCa in range, phos a little high. Cont phoslo if eating meals. Cont IV vdra.    Vinson Moselle  MD [facility] 02/11/2023, 7:47 PM  Recent Labs  Lab 02/09/23 1052 02/09/23 1720 02/10/23 0213 02/10/23 1122 02/11/23 0547  HGB  --    < > 10.9* 10.9* 10.0*  ALBUMIN 3.5  --   --   --  3.6  CALCIUM  --   --  9.7  --  9.6  PHOS  --   --  7.2*  --   --   CREATININE  --   --  7.09*  --  9.90*  K  --   --  5.2*  --  5.9*   < > = values in this interval not displayed.   Inpatient medications:  amLODipine  10 mg Oral Daily   [START ON 02/12/2023] Chlorhexidine Gluconate Cloth  6 each Topical Q0600   cloNIDine  0.3 mg Oral TID   labetalol  400 mg Oral BID   [START ON 02/12/2023] multivitamin  1 tablet Oral Daily   [START ON 02/12/2023]  pantoprazole  40 mg Oral Daily   sevelamer carbonate  800 mg Oral TID with meals    dextrose     [START ON 02/12/2023] levofloxacin (LEVAQUIN) IV     metronidazole     acetaminophen **OR** acetaminophen, bisacodyl, hydrALAZINE, HYDROmorphone (DILAUDID) injection, ondansetron **OR** ondansetron (ZOFRAN) IV

## 2023-02-11 NOTE — Progress Notes (Signed)
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 Patient was seen in follow-up on 5/13 by IR service and described some mild RUQ soreness at that time. Patient subsequently left the hospital AMA, but presented to [facility] in morning of 02/11/23 with severe abdominal pain. On presentation today, patient was febrile, had marked leukocytosis, and imaging revealed reactive/bloody ascites.  Subjective:  Patient alert, lying in bed at time of exam. He states he is in extreme LLQ abdominal pain and is nauseous. Patient does endorse smoking marijuana recently, but denies other drug use.  Allergies: Cucumber extract, Other, Tomato (diagnostic), Wild lettuce [wild lettuce extract (lactuca virosa)], and Hydralazine  Medications: Prior to Admission medications   Medication Sig Start Date End Date Taking? Authorizing Provider  RENVELA 800 MG tablet Take 800 mg by mouth 3 (three) times daily. 01/14/23  Yes [provider]  acetaminophen (TYLENOL) 325 MG tablet Take 2 tablets (650 mg total) by mouth every 6 (six) hours as needed for fever or mild pain. 01/02/22   Katsadouros, Vasilios, MD  amLODipine (NORVASC) 10 MG tablet Take 1 tablet (10 mg total) by mouth daily. 02/15/22   Ellison Carwin, MD  calcium acetate (PHOSLO) 667 MG capsule Take 1 capsule (667 mg total) by mouth 3 (three) times daily with meals. Patient not taking: Reported on 02/09/2023 01/18/22   Dellia Cloud, MD  cloNIDine (CATAPRES) 0.3 MG tablet Take 1 tablet (0.3 mg total) by mouth 3 (three) times daily. 12/05/22   Jacalyn Lefevre, MD  Darbepoetin Alfa (ARANESP) 300 MCG/0.6ML SOSY injection  Inject 0.6 mLs (300 mcg total) into the vein every dialysis (Tu,Th,Sat). 01/02/22   Katsadouros, Vasilios, MD  labetalol (NORMODYNE) 200 MG tablet Take 2 tablets (400 mg total) by mouth 2 (two) times daily. 02/15/22   Ellison Carwin, MD  mirtazapine (REMERON) 7.5 MG tablet Take 1 tablet (7.5 mg total) by mouth at bedtime. Start after weaned off Zoloft. 12/12/22   Marcine Matar, MD  multivitamin (RENA-VIT) TABS tablet Take 1 tablet by mouth daily.    [provider]  pantoprazole (PROTONIX) 40 MG tablet Take 1 tablet (40 mg total) by mouth daily. 02/15/22   Ellison Carwin, MD  sertraline (ZOLOFT) 50 MG tablet Take 1 tablet (50 mg total) by mouth daily. Patient not taking: Reported on 02/09/2023 02/15/22   Ellison Carwin, MD  torsemide (DEMADEX) 100 MG tablet Take 100 mg by mouth daily. Take after dialysis on dialysis days    [provider]  Vitamin D, Ergocalciferol, (DRISDOL) 1.25 MG (50000 UNIT) CAPS capsule Take 1 capsule (50,000 Units total) by mouth once a week. Patient not taking: Reported on 02/09/2023 02/07/22   Penninger, Monarch, PA     Vital Signs: BP (!) 175/103 (BP Location: Right Arm)   Pulse 86   Temp 98.4 F (36.9 C) (Oral)   Resp 20   Ht 5\' 9"  (1.753 m)   Wt 145 lb (65.8 kg)   SpO2 96%   BMI 21.41 kg/m   Physical Exam Vitals reviewed.  Constitutional:      General: He is in acute distress.     Appearance: He is ill-appearing.  Cardiovascular:     Rate and Rhythm: Normal rate and  regular rhythm.     Pulses: Normal pulses.     Heart sounds: Normal heart sounds.  Pulmonary:     Effort: Pulmonary effort is normal.     Breath sounds: Normal breath sounds.  Abdominal:     Tenderness: There is abdominal tenderness. There is guarding.     Comments: Patient significantly tender, states source of pain is umbilical pain that radiates to his LLQ.  Skin:    General: Skin is warm and dry.  Neurological:     Mental Status: He is alert and oriented to person,  place, and time.     Imaging: CT Angio Chest/Abd/Pel for Dissection W and/or Wo Contrast  Result Date: 02/11/2023 CLINICAL DATA:  47 year old male with history of worsening pain, fever and hypoxia. Recent history of hepatic arteriovenous malformation coil procedure. EXAM: CT ANGIOGRAPHY CHEST, ABDOMEN AND PELVIS TECHNIQUE: Non-contrast CT of the chest was initially obtained. Multidetector CT imaging through the chest, abdomen and pelvis was performed using the standard protocol during bolus administration of intravenous contrast. Multiplanar reconstructed images and MIPs were obtained and reviewed to evaluate the vascular anatomy. RADIATION DOSE REDUCTION: This exam was performed according to the departmental dose-optimization program which includes automated exposure control, adjustment of the mA and/or kV according to patient size and/or use of iterative reconstruction technique. CONTRAST:  60mL OMNIPAQUE IOHEXOL 350 MG/ML SOLN COMPARISON:  CTA of the chest, abdomen and pelvis 02/09/2023. FINDINGS: CTA CHEST FINDINGS Cardiovascular: Heart size is mildly enlarged. There is no significant pericardial fluid, thickening or pericardial calcification. No acute abnormality of the thoracic aorta or the great vessels of the mediastinum. Ascending thoracic aorta, mid aortic arch and descending thoracic aorta measure 3.2 cm, 3.0 cm and 2.3 cm in diameter respectively. No evidence of thoracic aortic dissection. Mediastinum/Nodes: Multiple prominent borderline enlarged mediastinal and bilateral hilar lymph nodes measuring up to 1.4 cm in short axis in the subcarinal nodal station, nonspecific, but similar to the prior study, and favored to be benign. Esophagus is unremarkable in appearance. No axillary lymphadenopathy. Lungs/Pleura: Dependent opacities are noted in the lungs bilaterally (left-greater-than-right), which appear predominantly atelectatic, although less well-defined areas of ground-glass attenuation and  airspace consolidation are noted in the extreme lung bases, which may suggest areas of infectious consolidation and inflammation. No pleural effusions. Pleural nodularity is noted in the right hemithorax, best appreciated on axial images 48 and 49 of series 4, where the largest pleural nodule measures 1.7 x 1.6 cm (40 HU). No left-sided pleural nodularity is noted. Mild diffuse bronchial wall thickening with mild centrilobular and paraseptal emphysema. No definite suspicious appearing pulmonary nodules or masses are noted. Musculoskeletal: There are no aggressive appearing lytic or blastic lesions noted in the visualized portions of the skeleton. Review of the MIP images confirms the above findings. CTA ABDOMEN AND PELVIS FINDINGS VASCULAR Aorta: Aortic atherosclerosis. Normal caliber aorta without aneurysm, dissection, vasculitis or significant stenosis. Celiac: Embolization coils are noted in the region of the of the left hepatic artery. Celiac axis and branches are otherwise without evidence of aneurysm, dissection, vasculitis or significant stenosis. SMA: Patent without evidence of aneurysm, dissection, vasculitis or significant stenosis. Renals: Both renal arteries are patent without evidence of aneurysm, dissection, vasculitis, fibromuscular dysplasia or significant stenosis. IMA: Patent without evidence of aneurysm, dissection, vasculitis or significant stenosis. Inflow: Patent without evidence of significant stenosis. Mild aneurysmal dilatation of the right common iliac artery (1.7 cm in diameter). Short segment dissection of the distal left common iliac artery which propagates into the  left external and internal iliac arteries over a very short distance, and is associated with mild aneurysmal dilatation of the left internal iliac artery (axial image 231 of series 4) measuring 1.1 cm in diameter, similar to the prior study. Veins: No obvious venous abnormality within the limitations of this arterial phase  study. Review of the MIP images confirms the above findings. NON-VASCULAR Hepatobiliary: Mild enlargement and heterogeneous attenuation in the left lobe of the liver predominantly in segment 3 related to prior intrahepatic hemorrhage and subsequent embolization. Perfusion throughout this region appears slightly diminished, likely secondary to embolization of branches of the left hepatic artery, without definitive evidence of persistent perfusion of the previously embolized pseudoaneurysm. No well organized fluid collection is confidently identified at this time. Liver is otherwise diffusely enlarged, but no focal hepatic lesions are noted. No intra or extrahepatic biliary ductal dilatation. High attenuation material within the lumen of the gallbladder is compatible with vicarious excretion of contrast material related to prior contrast-enhanced CT examinations and embolization procedure. Pancreas: No pancreatic mass. No pancreatic ductal dilatation. No pancreatic or peripancreatic fluid collections or inflammatory changes. Spleen: Unremarkable. Adrenals/Urinary Tract: Bilateral kidneys and adrenal glands are normal in appearance. No hydroureteronephrosis. Urinary bladder is nearly completely decompressed and otherwise unremarkable in appearance. Stomach/Bowel: The appearance of the stomach is normal. There is no pathologic dilatation of small bowel or colon. Normal appendix. Lymphatic: No lymphadenopathy noted in the abdomen or pelvis. Reproductive: Prostate gland is unremarkable in appearance. Other: Small to moderate volume of ascites, most evident adjacent to the liver and in the low anatomic pelvis. This is intermediate to high attenuation, indicative of internal blood products. Musculoskeletal: There are no aggressive appearing lytic or blastic lesions noted in the visualized portions of the skeleton. Review of the MIP images confirms the above findings. IMPRESSION: 1. Postprocedural changes of recent coil  embolization of pseudoaneurysm in the left lobe of the liver without definite residual perfusion of the pseudoaneurysm. 2. Small to moderate volume of intermediate to high attenuation ascites in the peritoneal cavity reflecting the presence of blood products within the ascites. No well organized fluid collection identified to suggest abscess in the abdomen or pelvis. 3. Dependent opacities in the lung bases predominantly atelectatic, although some areas of airspace consolidation are evident, which could reflect resolving infection or sequela of aspiration. 4. Pleural nodularity in the posterior aspect of the right hemithorax. This is of uncertain etiology and significance, but the possibility of malignancy is not entirely excluded. Follow-up contrast-enhanced chest CT is recommended in 2 months to re-evaluate this finding and ensure the stability or regression. At the time of the follow-up imaging, attention to multiple borderline enlarged mediastinal and bilateral hilar lymph nodes is also suggested to ensure stability or regression. 5. Cardiomegaly. 6. 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 7. Mild diffuse bronchial wall thickening with mild centrilobular and paraseptal emphysema; imaging findings suggestive of underlying COPD. Electronically Signed   By: Trudie Reed M.D.   On: 02/11/2023 06:59   DG Chest 1 View  Result Date: 02/11/2023 CLINICAL DATA:  Shortness of breath, fever and hypoxia. 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 EXAM: CHEST  1 VIEW COMPARISON:  CTA chest  02/09/2023 FINDINGS: There is mild cardiomegaly. Vascular markings are normal  caliber. The lungs show chronic changes and linear platelike basilar atelectasis without appreciable infiltrates. A single surgical clip superimposes in the right upper to mid lung field. The mediastinum is normally outlined. The thoracic cage is intact.  There is overlying monitor wiring. Compare: Overall aeration seems unchanged. IMPRESSION: Chronic lung changes with no evidence of acute chest process. Mild cardiomegaly. Electronically Signed   By: Almira Bar M.D.   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 He presents for emergent angiogram and embolization. EXAM: IR EMBO ART VEN HEMORR LYMPH EXTRAV INC GUIDE ROADMAPPING; SELECTIVE VISCERAL ARTERIOGRAPHY; IR ULTRASOUND GUIDANCE VASC ACCESS RIGHT MEDICATIONS: None. ANESTHESIA/SEDATION: Moderate (conscious) sedation was employed during this procedure. A total of Versed 1 mg and Fentanyl 25 mcg was administered intravenously. Moderate Sedation Time: 83 minutes. The patient's level of consciousness and vital signs were monitored continuously by radiology nursing throughout the procedure under my direct supervision. CONTRAST:  70 mL Isovue 300 FLUOROSCOPY: Radiation Exposure Index (as provided by the fluoroscopic device): 1,099 mGy Kerma COMPLICATIONS: None immediate. PROCEDURE: Informed consent was obtained from the patient following explanation of the procedure, risks, benefits and alternatives. The patient understands, agrees and consents for the procedure. All questions were addressed. A time out was performed prior to the initiation of the procedure. Maximal barrier sterile technique utilized including caps, mask, sterile gowns, sterile gloves, large sterile drape, hand hygiene, and Betadine prep. The right common femoral artery was interrogated with ultrasound and found to be  widely patent. An image was obtained and stored for the medical record. Local anesthesia was attained by infiltration with 1% lidocaine. A small dermatotomy was made. Under real-time sonographic guidance, the vessel was punctured with a 21 gauge micropuncture needle. Using standard technique, the initial micro needle was exchanged over a 0.018 micro wire for a transitional 4 [place] micro sheath. The micro sheath was then exchanged over a 0.035 wire for a 5 French vascular sheath. A Sos Omni selective catheter was advanced over a Bentson wire and arteriography was performed. There is inadvertent catheterization of the right inferior phrenic artery with arteriogram. No evidence of abnormality. The celiac axis was catheterized and arteriography was performed. Unfortunately, the vessel is very robust and extremely down sloping. The Sos catheter points at a prominent left gastric artery. Purchase is not particularly strong. A longer reverse curve catheter is necessary. Therefore, the Sos catheter was exchanged for a Simmons catheter which was advanced over the aortic bifurcation. The Simmons catheter was used to catheterize the common hepatic artery. A hepatic arteriogram is performed. Hypertrophic and highly tortuous hepatic arteries. Large pseudoaneurysm arising from the segment 3 left hepatic artery measures grossly 5.3 x 3.3 cm. There is intravasation into an drainage through the left hepatic vein. A Cook can tot a microcatheter was advanced over a Fathom wire and used to select the left hepatic artery. Arteriography was performed. The segment 2 hepatic artery is visualized. The segment 3 hepatic artery bifurcates into a superior and inferior division. The pseudoaneurysm arises from the inferior division of the segment 3 artery. The microcatheter was further advanced into the inferior division. Arteriography again was again performed confirming the location of the arterial injury and pseudoaneurysm. The vessel  measures approximately 2.7 mm. First, embolization was attempted using a Medtronic microvascular plug. Unfortunately, the  plug could not be advanced due to the high tortuosity of the vessels without backing the catheter out. Therefore, the micro vascular plug was discarded. An initial attempt was made to embolize using a 4 x 15 mm Ruby low-profile detachable coil. However, the coil would not form within the artery without prolapsing into the large pseudoaneurysm sac. Therefore, therefore, this coil was recovered and set aside on the back table. Utilizing an exchange length 0.014 wire, the cantata microcatheter was exchanged for a lantern microcatheter. A 3 mm pod detachable coil was successfully deployed in the inferior division of the segment 3 artery forming the initial coil pack. Subsequent coil embolization was performed utilizing the 4 x 15 mm Ruby low-profile detachable coil, a 30 cm low-profile packing coil and a 30 cm standard packing coil. This resulted in complete embolization of the inferior and superior divisions of the segment 3 hepatic artery. Follow-up arteriography demonstrates no further opacification of the pseudoaneurysm. The catheters were removed. Hemostasis was attained with the assistance of a Celt arterial closure device. IMPRESSION: 1. Large arterial pseudoaneurysm arising from the inferior division of the segment 3 hepatic artery. There is associated intravasation through the hepatic parenchyma resulting in arteriovenous shunting into the left hepatic vein. 2. Successful coil embolization of the segment 3 hepatic artery with no further flow into the pseudoaneurysm. Electronically Signed   By: Malachy Moan M.D.   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 He presents for emergent angiogram and embolization. EXAM: IR EMBO ART VEN HEMORR  LYMPH EXTRAV INC GUIDE ROADMAPPING; SELECTIVE VISCERAL ARTERIOGRAPHY; IR ULTRASOUND GUIDANCE VASC ACCESS RIGHT MEDICATIONS: None. ANESTHESIA/SEDATION: Moderate (conscious) sedation was employed during this procedure. A total of Versed 1 mg and Fentanyl 25 mcg was administered intravenously. Moderate Sedation Time: 83 minutes. The patient's level of consciousness and vital signs were monitored continuously by radiology nursing throughout the procedure under my direct supervision. CONTRAST:  70 mL Isovue 300 FLUOROSCOPY: Radiation Exposure Index (as provided by the fluoroscopic device): 1,099 mGy Kerma COMPLICATIONS: None immediate. PROCEDURE: Informed consent was obtained from the patient following explanation of the procedure, risks, benefits and alternatives. The patient understands, agrees and consents for the procedure. All questions were addressed. A time out was performed prior to the initiation of the procedure. Maximal barrier sterile technique utilized including caps, mask, sterile gowns, sterile gloves, large sterile drape, hand hygiene, and Betadine prep. The right common femoral artery was interrogated with ultrasound and found to be widely patent. An image was obtained and stored for the medical record. Local anesthesia was attained by infiltration with 1% lidocaine. A small dermatotomy was made. Under real-time sonographic guidance, the vessel was punctured with a 21 gauge micropuncture needle. Using standard technique, the initial micro needle was exchanged over a 0.018 micro wire for a transitional 4 [place] micro sheath. The micro sheath was then exchanged over a 0.035 wire for a 5 French vascular sheath. A Sos Omni selective catheter was advanced over a Bentson wire and arteriography was performed. There is inadvertent catheterization of the right inferior phrenic artery with arteriogram. No evidence of abnormality. The celiac axis was catheterized and arteriography was performed. Unfortunately, the  vessel is very robust and extremely down sloping. The Sos catheter points at a prominent left gastric artery. Purchase is not particularly strong. A longer reverse curve catheter is necessary. Therefore, the Sos catheter was exchanged for a [employer]  catheter which was advanced over the aortic bifurcation. The Simmons catheter was used to catheterize the common hepatic artery. A hepatic arteriogram is performed. Hypertrophic and highly tortuous hepatic arteries. Large pseudoaneurysm arising from the segment 3 left hepatic artery measures grossly 5.3 x 3.3 cm. There is intravasation into an drainage through the left hepatic vein. A Cook can tot a microcatheter was advanced over a Fathom wire and used to select the left hepatic artery. Arteriography was performed. The segment 2 hepatic artery is visualized. The segment 3 hepatic artery bifurcates into a superior and inferior division. The pseudoaneurysm arises from the inferior division of the segment 3 artery. The microcatheter was further advanced into the inferior division. Arteriography again was again performed confirming the location of the arterial injury and pseudoaneurysm. The vessel measures approximately 2.7 mm. First, embolization was attempted using a Medtronic microvascular plug. Unfortunately, the plug could not be advanced due to the high tortuosity of the vessels without backing the catheter out. Therefore, the micro vascular plug was discarded. An initial attempt was made to embolize using a 4 x 15 mm Ruby low-profile detachable coil. However, the coil would not form within the artery without prolapsing into the large pseudoaneurysm sac. Therefore, therefore, this coil was recovered and set aside on the back table. Utilizing an exchange length 0.014 wire, the cantata microcatheter was exchanged for a lantern microcatheter. A 3 mm pod detachable coil was successfully deployed in the inferior division of the segment 3 artery forming the initial coil  pack. Subsequent coil embolization was performed utilizing the 4 x 15 mm Ruby low-profile detachable coil, a 30 cm low-profile packing coil and a 30 cm standard packing coil. This resulted in complete embolization of the inferior and superior divisions of the segment 3 hepatic artery. Follow-up arteriography demonstrates no further opacification of the pseudoaneurysm. The catheters were removed. Hemostasis was attained with the assistance of a Celt arterial closure device. IMPRESSION: 1. Large arterial pseudoaneurysm arising from the inferior division of the segment 3 hepatic artery. There is associated intravasation through the hepatic parenchyma resulting in arteriovenous shunting into the left hepatic vein. 2. Successful coil embolization of the segment 3 hepatic artery with no further flow into the pseudoaneurysm. Electronically Signed   By: Malachy Moan M.D.   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 He presents for emergent angiogram and embolization. EXAM: IR EMBO ART VEN HEMORR LYMPH EXTRAV INC GUIDE ROADMAPPING; SELECTIVE VISCERAL ARTERIOGRAPHY; IR ULTRASOUND GUIDANCE VASC ACCESS RIGHT MEDICATIONS: None. ANESTHESIA/SEDATION: Moderate (conscious) sedation was employed during this procedure. A total of Versed 1 mg and Fentanyl 25 mcg was administered intravenously. Moderate Sedation Time: 83 minutes. The patient's level of consciousness and vital signs were monitored continuously by radiology nursing throughout the procedure under my direct supervision. CONTRAST:  70 mL Isovue 300 FLUOROSCOPY: Radiation Exposure Index (as provided by the fluoroscopic device): 1,099 mGy Kerma COMPLICATIONS: None immediate. PROCEDURE: Informed consent was obtained from the patient following explanation of the procedure, risks, benefits and alternatives. The patient  understands, agrees and consents for the procedure. All questions were addressed. A time out was performed prior to the initiation of the procedure. Maximal barrier sterile technique utilized including caps, mask, sterile gowns, sterile gloves, large sterile drape, hand hygiene, and Betadine prep. The right common femoral artery was interrogated with ultrasound and found to be widely patent. An image was obtained and stored for  the medical record. Local anesthesia was attained by infiltration with 1% lidocaine. A small dermatotomy was made. Under real-time sonographic guidance, the vessel was punctured with a 21 gauge micropuncture needle. Using standard technique, the initial micro needle was exchanged over a 0.018 micro wire for a transitional 4 [place] micro sheath. The micro sheath was then exchanged over a 0.035 wire for a 5 French vascular sheath. A Sos Omni selective catheter was advanced over a Bentson wire and arteriography was performed. There is inadvertent catheterization of the right inferior phrenic artery with arteriogram. No evidence of abnormality. The celiac axis was catheterized and arteriography was performed. Unfortunately, the vessel is very robust and extremely down sloping. The Sos catheter points at a prominent left gastric artery. Purchase is not particularly strong. A longer reverse curve catheter is necessary. Therefore, the Sos catheter was exchanged for a Simmons catheter which was advanced over the aortic bifurcation. The Simmons catheter was used to catheterize the common hepatic artery. A hepatic arteriogram is performed. Hypertrophic and highly tortuous hepatic arteries. Large pseudoaneurysm arising from the segment 3 left hepatic artery measures grossly 5.3 x 3.3 cm. There is intravasation into an drainage through the left hepatic vein. A Cook can tot a microcatheter was advanced over a Fathom wire and used to select the left hepatic artery. Arteriography was performed. The segment  2 hepatic artery is visualized. The segment 3 hepatic artery bifurcates into a superior and inferior division. The pseudoaneurysm arises from the inferior division of the segment 3 artery. The microcatheter was further advanced into the inferior division. Arteriography again was again performed confirming the location of the arterial injury and pseudoaneurysm. The vessel measures approximately 2.7 mm. First, embolization was attempted using a Medtronic microvascular plug. Unfortunately, the plug could not be advanced due to the high tortuosity of the vessels without backing the catheter out. Therefore, the micro vascular plug was discarded. An initial attempt was made to embolize using a 4 x 15 mm Ruby low-profile detachable coil. However, the coil would not form within the artery without prolapsing into the large pseudoaneurysm sac. Therefore, therefore, this coil was recovered and set aside on the back table. Utilizing an exchange length 0.014 wire, the cantata microcatheter was exchanged for a lantern microcatheter. A 3 mm pod detachable coil was successfully deployed in the inferior division of the segment 3 artery forming the initial coil pack. Subsequent coil embolization was performed utilizing the 4 x 15 mm Ruby low-profile detachable coil, a 30 cm low-profile packing coil and a 30 cm standard packing coil. This resulted in complete embolization of the inferior and superior divisions of the segment 3 hepatic artery. Follow-up arteriography demonstrates no further opacification of the pseudoaneurysm. The catheters were removed. Hemostasis was attained with the assistance of a Celt arterial closure device. IMPRESSION: 1. Large arterial pseudoaneurysm arising from the inferior division of the segment 3 hepatic artery. There is associated intravasation through the hepatic parenchyma resulting in arteriovenous shunting into the left hepatic vein. 2. Successful coil embolization of the segment 3 hepatic artery  with no further flow into the pseudoaneurysm. Electronically Signed   By: Malachy Moan M.D.   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 He presents for emergent angiogram and embolization. EXAM: IR EMBO ART VEN HEMORR LYMPH EXTRAV INC GUIDE ROADMAPPING; SELECTIVE VISCERAL ARTERIOGRAPHY; IR ULTRASOUND GUIDANCE VASC ACCESS RIGHT MEDICATIONS: None. ANESTHESIA/SEDATION: Moderate (conscious) sedation was  employed during this procedure. A total of Versed 1 mg and Fentanyl 25 mcg was administered intravenously. Moderate Sedation Time: 83 minutes. The patient's level of consciousness and vital signs were monitored continuously by radiology nursing throughout the procedure under my direct supervision. CONTRAST:  70 mL Isovue 300 FLUOROSCOPY: Radiation Exposure Index (as provided by the fluoroscopic device): 1,099 mGy Kerma COMPLICATIONS: None immediate. PROCEDURE: Informed consent was obtained from the patient following explanation of the procedure, risks, benefits and alternatives. The patient understands, agrees and consents for the procedure. All questions were addressed. A time out was performed prior to the initiation of the procedure. Maximal barrier sterile technique utilized including caps, mask, sterile gowns, sterile gloves, large sterile drape, hand hygiene, and Betadine prep. The right common femoral artery was interrogated with ultrasound and found to be widely patent. An image was obtained and stored for the medical record. Local anesthesia was attained by infiltration with 1% lidocaine. A small dermatotomy was made. Under real-time sonographic guidance, the vessel was punctured with a 21 gauge micropuncture needle. Using standard technique, the initial micro needle was exchanged over a 0.018 micro wire for a transitional 4 [place] micro sheath. The micro  sheath was then exchanged over a 0.035 wire for a 5 French vascular sheath. A Sos Omni selective catheter was advanced over a Bentson wire and arteriography was performed. There is inadvertent catheterization of the right inferior phrenic artery with arteriogram. No evidence of abnormality. The celiac axis was catheterized and arteriography was performed. Unfortunately, the vessel is very robust and extremely down sloping. The Sos catheter points at a prominent left gastric artery. Purchase is not particularly strong. A longer reverse curve catheter is necessary. Therefore, the Sos catheter was exchanged for a Simmons catheter which was advanced over the aortic bifurcation. The Simmons catheter was used to catheterize the common hepatic artery. A hepatic arteriogram is performed. Hypertrophic and highly tortuous hepatic arteries. Large pseudoaneurysm arising from the segment 3 left hepatic artery measures grossly 5.3 x 3.3 cm. There is intravasation into an drainage through the left hepatic vein. A Cook can tot a microcatheter was advanced over a Fathom wire and used to select the left hepatic artery. Arteriography was performed. The segment 2 hepatic artery is visualized. The segment 3 hepatic artery bifurcates into a superior and inferior division. The pseudoaneurysm arises from the inferior division of the segment 3 artery. The microcatheter was further advanced into the inferior division. Arteriography again was again performed confirming the location of the arterial injury and pseudoaneurysm. The vessel measures approximately 2.7 mm. First, embolization was attempted using a Medtronic microvascular plug. Unfortunately, the plug could not be advanced due to the high tortuosity of the vessels without backing the catheter out. Therefore, the micro vascular plug was discarded. An initial attempt was made to embolize using a 4 x 15 mm Ruby low-profile detachable coil. However, the coil would not form within the  artery without prolapsing into the large pseudoaneurysm sac. Therefore, therefore, this coil was recovered and set aside on the back table. Utilizing an exchange length 0.014 wire, the cantata microcatheter was exchanged for a lantern microcatheter. A 3 mm pod detachable coil was successfully deployed in the inferior division of the segment 3 artery forming the initial coil pack. Subsequent coil embolization was performed utilizing the 4 x 15 mm Ruby low-profile detachable coil, a 30 cm low-profile packing coil and a 30 cm standard packing coil. This resulted in complete embolization of the inferior and superior divisions of  the segment 3 hepatic artery. Follow-up arteriography demonstrates no further opacification of the pseudoaneurysm. The catheters were removed. Hemostasis was attained with the assistance of a Celt arterial closure device. IMPRESSION: 1. Large arterial pseudoaneurysm arising from the inferior division of the segment 3 hepatic artery. There is associated intravasation through the hepatic parenchyma resulting in arteriovenous shunting into the left hepatic vein. 2. Successful coil embolization of the segment 3 hepatic artery with no further flow into the pseudoaneurysm. Electronically Signed   By: Malachy Moan M.D.   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 He presents for emergent angiogram and embolization. EXAM: IR EMBO ART VEN HEMORR LYMPH EXTRAV INC GUIDE ROADMAPPING; SELECTIVE VISCERAL ARTERIOGRAPHY; IR ULTRASOUND GUIDANCE VASC ACCESS RIGHT MEDICATIONS: None. ANESTHESIA/SEDATION: Moderate (conscious) sedation was employed during this procedure. A total of Versed 1 mg and Fentanyl 25 mcg was administered intravenously. Moderate Sedation Time: 83 minutes. The patient's level of consciousness and vital signs were monitored continuously by  radiology nursing throughout the procedure under my direct supervision. CONTRAST:  70 mL Isovue 300 FLUOROSCOPY: Radiation Exposure Index (as provided by the fluoroscopic device): 1,099 mGy Kerma COMPLICATIONS: None immediate. PROCEDURE: Informed consent was obtained from the patient following explanation of the procedure, risks, benefits and alternatives. The patient understands, agrees and consents for the procedure. All questions were addressed. A time out was performed prior to the initiation of the procedure. Maximal barrier sterile technique utilized including caps, mask, sterile gowns, sterile gloves, large sterile drape, hand hygiene, and Betadine prep. The right common femoral artery was interrogated with ultrasound and found to be widely patent. An image was obtained and stored for the medical record. Local anesthesia was attained by infiltration with 1% lidocaine. A small dermatotomy was made. Under real-time sonographic guidance, the vessel was punctured with a 21 gauge micropuncture needle. Using standard technique, the initial micro needle was exchanged over a 0.018 micro wire for a transitional 4 [place] micro sheath. The micro sheath was then exchanged over a 0.035 wire for a 5 French vascular sheath. A Sos Omni selective catheter was advanced over a Bentson wire and arteriography was performed. There is inadvertent catheterization of the right inferior phrenic artery with arteriogram. No evidence of abnormality. The celiac axis was catheterized and arteriography was performed. Unfortunately, the vessel is very robust and extremely down sloping. The Sos catheter points at a prominent left gastric artery. Purchase is not particularly strong. A longer reverse curve catheter is necessary. Therefore, the Sos catheter was exchanged for a Simmons catheter which was advanced over the aortic bifurcation. The Simmons catheter was used to catheterize the common hepatic artery. A hepatic arteriogram is  performed. Hypertrophic and highly tortuous hepatic arteries. Large pseudoaneurysm arising from the segment 3 left hepatic artery measures grossly 5.3 x 3.3 cm. There is intravasation into an drainage through the left hepatic vein. A Cook can tot a microcatheter was advanced over a Fathom wire and used to select the left hepatic artery. Arteriography was performed. The segment 2 hepatic artery is visualized. The segment 3 hepatic artery bifurcates into a superior and inferior division. The pseudoaneurysm arises from the inferior division of the segment 3 artery. The microcatheter was further advanced into the inferior division. Arteriography again was again performed confirming the location of the arterial injury and pseudoaneurysm. The vessel measures approximately 2.7 mm. First, embolization was attempted using a Medtronic microvascular plug. Unfortunately, the  plug could not be advanced due to the high tortuosity of the vessels without backing the catheter out. Therefore, the micro vascular plug was discarded. An initial attempt was made to embolize using a 4 x 15 mm Ruby low-profile detachable coil. However, the coil would not form within the artery without prolapsing into the large pseudoaneurysm sac. Therefore, therefore, this coil was recovered and set aside on the back table. Utilizing an exchange length 0.014 wire, the cantata microcatheter was exchanged for a lantern microcatheter. A 3 mm pod detachable coil was successfully deployed in the inferior division of the segment 3 artery forming the initial coil pack. Subsequent coil embolization was performed utilizing the 4 x 15 mm Ruby low-profile detachable coil, a 30 cm low-profile packing coil and a 30 cm standard packing coil. This resulted in complete embolization of the inferior and superior divisions of the segment 3 hepatic artery. Follow-up arteriography demonstrates no further opacification of the pseudoaneurysm. The catheters were removed.  Hemostasis was attained with the assistance of a Celt arterial closure device. IMPRESSION: 1. Large arterial pseudoaneurysm arising from the inferior division of the segment 3 hepatic artery. There is associated intravasation through the hepatic parenchyma resulting in arteriovenous shunting into the left hepatic vein. 2. Successful coil embolization of the segment 3 hepatic artery with no further flow into the pseudoaneurysm. Electronically Signed   By: Malachy Moan M.D.   On: 02/09/2023 21:32   CT Angio Chest/Abd/Pel for Dissection W and/or Wo Contrast  Result Date: 02/09/2023 CLINICAL DATA:  Acute  aortic syndrome suspected. EXAM: CT ANGIOGRAPHY CHEST, ABDOMEN AND PELVIS TECHNIQUE: Non-contrast CT of the chest was initially obtained. Multidetector CT imaging through the chest, abdomen and pelvis was performed using the standard protocol during bolus administration of intravenous contrast. Multiplanar reconstructed images and MIPs were obtained and reviewed to evaluate the vascular anatomy. RADIATION DOSE REDUCTION: This exam was performed according to the departmental dose-optimization program which includes automated exposure control, adjustment of the mA and/or kV according to patient size and/or use of iterative reconstruction technique. CONTRAST:  75mL OMNIPAQUE IOHEXOL 350 MG/ML SOLN COMPARISON:  MRI abdomen 12/03/2021, noncontrast CT 12/05/2022, ultrasound 02/09/2023 FINDINGS: CTA CHEST FINDINGS Cardiovascular: Noncontrast series demonstrates no intramural hematoma thoracic aorta. Contrast series demonstrates no aortic dissection or aneurysm. Mediastinum/Nodes: No axillary or supraclavicular adenopathy. No mediastinal or hilar adenopathy. No pericardial fluid. Esophagus normal. Lungs/Pleura: No suspicious pulmonary nodules. Normal pleural. Airways normal. Musculoskeletal: No aggressive osseous lesion. Review of the MIP images confirms the above findings. CTA ABDOMEN AND PELVIS FINDINGS VASCULAR  Aorta: Normal caliber aorta without aneurysm, dissection, vasculitis or significant stenosis. Celiac: Patent without evidence of aneurysm, dissection, vasculitis or significant stenosis. SMA: Patent without evidence of aneurysm, dissection, vasculitis or significant stenosis. Renals: Both renal arteries are patent without evidence of aneurysm, dissection, vasculitis, fibromuscular dysplasia or significant stenosis. IMA: Patent without evidence of aneurysm, dissection, vasculitis or significant stenosis. Inflow: Patent without evidence of aneurysm, dissection, vasculitis or significant stenosis. Veins: No obvious venous abnormality within the limitations of this arterial phase study. Review of the MIP images confirms the above findings. NON-VASCULAR Hepatobiliary: There is an early enhancing vascular lesion within the lateral segment LEFT hepatic lobe (segment 2 and segment 3). Rounded vascular structure measuring 3.7 by 3.3 cm in total dimension (image 179/7). 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 No evidence of fistula on noncontrast MRI December 03, 2021 or noncontrast CT 12/05/2022. Pancreas: Pancreas is normal. No ductal dilatation. No pancreatic inflammation. Spleen: Normal spleen Adrenals/urinary tract: Adrenal glands and kidneys are normal. The ureters and bladder normal. Stomach/Bowel: Stomach, small bowel, appendix, and cecum are normal. The colon and rectosigmoid colon are normal. Vascular/Lymphatic: No lymphadenopathy Reproductive: Prostate unremarkable Other: No free fluid. Musculoskeletal: No aggressive osseous lesion. Review of the MIP images confirms the above findings. IMPRESSION: CHEST IMPRESSION: 1. No evidence of aortic dissection or aneurysm. 2. No acute pulmonary parenchymal findings. PELVIS IMPRESSION: 1. No evidence of aortic dissection or aneurysm. 2. No  acute findings in the abdomen pelvis. 3. 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 Recommend non emergent interventional radiology consultation. Dedicated ultrasound evaluation the lateral segment LEFT hepatic lobe may be valuable. Lesion appears new from MRI 12/03/2021 (noncontrast). Electronically Signed   By: Genevive Bi M.D.   On: 02/09/2023 15:42   US Abdomen Limited RUQ (LIVER/GB)  Result Date: 02/09/2023 CLINICAL DATA:  Right upper quadrant discomfort EXAM: ULTRASOUND ABDOMEN LIMITED RIGHT UPPER QUADRANT COMPARISON:  None Available. FINDINGS: Gallbladder: Mild gallbladder wall thickening measuring 4.2 mm. Mild pericholecystic fluid. No stones, sludge, or Murphy's sign. Common bile duct: Diameter: 2 mm Liver: Diffuse increased echogenicity. No focal mass. Portal vein is patent on color Doppler imaging with normal direction of blood flow towards the liver. Other: Mild ascites in this dialysis patient. The right kidney is echogenic. IMPRESSION: 1. Mild gallbladder wall thickening. Mild pericholecystic fluid is nonspecific in a patient with ascites. No stones, sludge, or Murphy's sign. 2. Diffuse increased echogenicity in the liver is nonspecific but often due to hepatic steatosis. 3. Mild ascites in this dialysis patient. 4. The right kidney is echogenic consistent with medical renal disease. Electronically Signed   By: Gerome Sam III M.D.   On: 02/09/2023 12:10   DG Chest Portable 1 View  Result Date: 02/09/2023 CLINICAL DATA:  Chest pain EXAM: PORTABLE CHEST 1 VIEW COMPARISON:  Feb 04, 2023 FINDINGS: Stable cardiomegaly. Hila and mediastinum are normal. No pneumothorax. Mild bibasilar opacities. IMPRESSION: Mild bibasilar opacities could represent early infiltrate. Developing asymmetric edema is a possibility. Recommend clinical correlation. Electronically Signed   By: Gerome Sam III M.D.   On:  02/09/2023 10:57    Labs:  CBC: Recent Labs    02/09/23 1720 02/10/23 0213 02/10/23 1122 02/11/23 0547  WBC 7.9 11.0* 19.7* 28.4*  HGB 10.4* 10.9* 10.9* 10.0*  HCT 33.2* 33.2* 33.8* 29.3*  PLT 161 203 183 147*    COAGS: Recent Labs    02/09/23 1720 02/11/23 0850  INR 1.2 1.6*  APTT  --  40*    BMP: Recent Labs    12/05/22 1523 02/09/23 0950 02/10/23 0213 02/11/23 0547  NA 134* 136 132* 128*  K 3.8 4.6 5.2* 5.9*  CL 98 99 96* 94*  CO2 26 24 21* 19*  GLUCOSE 141* 130* 121* 88  BUN 49* 37* 47* 71*  CALCIUM 9.2 9.7 9.7 9.6  CREATININE 6.48* 5.66* 7.09* 9.90*  GFRNONAA 10* 12* 9* 6*    LIVER FUNCTION TESTS: Recent Labs    12/05/22 1523 02/09/23 1052 02/11/23 0547  BILITOT 1.4* 1.3* 1.6*  AST 23 46* 201*  ALT 13 25 116*  ALKPHOS 138* 172* 102  PROT 7.5 9.1* 7.7  ALBUMIN 2.7* 3.5 3.6    Assessment and Plan:  Abdominal pain s/p hepatic embolization 02/09/23; 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 -Hgb of 10.0, down from 10.9 yesterday. Continue to monitor with daily CBC -Elevated liver enzymes and Tbili, likely related to post-embolization syndrome -Imaging reviewed with Dr Loreta Ave, no intervention from IR necessary at this time.  -Abdominal ascites not felt to require paracentesis at this time based on CT  -IR will continue to follow patient, please contact IR team with any questions or concerns. 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

## 2023-02-11 NOTE — Progress Notes (Signed)
Plan of Care Note for accepted transfer   Patient: Roy Randolph MRN: [id number]   DOA: 02/11/2023  Facility requesting transfer: [facility] Requesting Provider: Rhunette Croft Reason for transfer:  PNA Facility course: Patient with h/o ETOH dependence, marijuana use, HTN, ESRD on HD, and PRES presenting with abdominal pain.  He was last admitted to the ICU service from 5/12-13 with a hepatic AV fistula that was successfully embolized.  He was treated with clevidipine, consistently refused CPAP for OSA, and left AMA with plan to go to outpatient HD yesterday but did not go.  Febrile to 101.    BP 89/72 on admission RR currently 38, O2 sats as low as 67%.  I have asked that PCCM be consulted first, as he appears to be tenuous based on vitals.  Discussed with PCCM, they recommend [facility] admission to progressive care.  Discussed with nephrology, usually gets 4x/week HD. Septic now, ?source.  PCCM will co-manage.   Plan of care: The patient is accepted for admission to Progressive unit, at [facility]..  Author: Jonah Blue, MD 02/11/2023  Check [email or web address] for on-call coverage.  Nursing staff, Please call [facility] Admits & Consults System-Wide number on Amion as soon as patient's arrival, so appropriate admitting provider can evaluate the pt.

## 2023-02-11 NOTE — ED Notes (Signed)
Patient takes dialysis Monday, Tuesday, Thursday and Saturday. Patient didn't receive dialysis on Monday.

## 2023-02-11 NOTE — ED Provider Notes (Signed)
[place] [facility]ERGENCY DEPARTMENT AT [facility]  Provider Note  CSN: [id number] Arrival date & time: 02/11/23 0501  History Chief Complaint  Patient presents with   Abdominal Pain    Roy Randolph is a 47 y.o. male with history of ESRD on HD M,T,Th,Sa and HTN was admitted to the hospital 5/12 for AV fistula in liver with hemorrhage, was taken to IR for coiling. Also was on cardene drip for uncontrolled HTN but left AMA after the procedure. He was scheduled to go to dialysis on 5/13 but did not go. He reports increased pain in R abdomen, also reports pain in left and periumbilical abdomen. Was noted to be febrile on arrival but HR and BP are normal. Some increased RR and hypoxia, unclear etiology. He has had PTX in the past. Denies any cough or chest pain today.    Home Medications Prior to Admission medications   Medication Sig Start Date End Date Taking? Authorizing Provider  acetaminophen (TYLENOL) 325 MG tablet Take 2 tablets (650 mg total) by mouth every 6 (six) hours as needed for fever or mild pain. 01/02/22   Katsadouros, Vasilios, MD  amLODipine (NORVASC) 10 MG tablet Take 1 tablet (10 mg total) by mouth daily. 02/15/22   Ellison Carwin, MD  bumetanide (BUMEX) 2 MG tablet Take 2 mg by mouth 2 (two) times daily. Take 1 tablet (2MG ) by mouth twice daily Patient not taking: Reported on 02/09/2023 11/17/22   [provider]  calcium acetate (PHOSLO) 667 MG capsule Take 1 capsule (667 mg total) by mouth 3 (three) times daily with meals. Patient not taking: Reported on 02/09/2023 01/18/22   Dellia Cloud, MD  cloNIDine (CATAPRES) 0.3 MG tablet Take 1 tablet (0.3 mg total) by mouth 3 (three) times daily. 12/05/22   Jacalyn Lefevre, MD  Darbepoetin Alfa (ARANESP) 300 MCG/0.6ML SOSY injection Inject 0.6 mLs (300 mcg total) into the vein every dialysis (Tu,Th,Sat). 01/02/22   Katsadouros, Vasilios, MD  labetalol (NORMODYNE) 200 MG tablet Take 2 tablets (400 mg total) by mouth 2  (two) times daily. 02/15/22   Ellison Carwin, MD  mirtazapine (REMERON) 7.5 MG tablet Take 1 tablet (7.5 mg total) by mouth at bedtime. Start after weaned off Zoloft. 12/12/22   Marcine Matar, MD  multivitamin (RENA-VIT) TABS tablet Take 1 tablet by mouth daily.    [provider]  NIFEdipine (ADALAT CC) 30 MG 24 hr tablet Take 30 mg by mouth 2 (two) times daily.  Take 1 tablet (30 MG) by mouth every 12 hours Patient not taking: Reported on 02/09/2023 09/24/22   [provider]  pantoprazole (PROTONIX) 40 MG tablet Take 1 tablet (40 mg total) by mouth daily. 02/15/22   Ellison Carwin, MD  sertraline (ZOLOFT) 50 MG tablet Take 1 tablet (50 mg total) by mouth daily. Patient not taking: Reported on 02/09/2023 02/15/22   Ellison Carwin, MD  torsemide (DEMADEX) 100 MG tablet Take 100 mg by mouth daily. Take after dialysis on dialysis days    [provider]  Vitamin D, Ergocalciferol, (DRISDOL) 1.25 MG (50000 UNIT) CAPS capsule Take 1 capsule (50,000 Units total) by mouth once a week. Patient not taking: Reported on 02/09/2023 02/07/22   Penninger, Lillia Abed, PA     Allergies    Cucumber extract, Other, Tomato (diagnostic), Wild lettuce [wild lettuce extract (lactuca virosa)], and Hydralazine   Review of Systems   Review of Systems Please see HPI for pertinent positives and negatives  Physical Exam BP 131/68  Pulse 78   Temp (!) 101.3 F (38.5 C)   Resp (!) 38   Ht 5\' 9"  (1.753 m)   Wt 65.8 kg   SpO2 92%   BMI 21.41 kg/m   Physical Exam Vitals and nursing note reviewed.  Constitutional:      Appearance: Normal appearance.  HENT:     Head: Normocephalic and atraumatic.     Nose: Nose normal.     Mouth/Throat:     Mouth: Mucous membranes are moist.  Eyes:     Extraocular Movements: Extraocular movements intact.     Conjunctiva/sclera: Conjunctivae normal.  Cardiovascular:     Rate and Rhythm: Normal rate.  Pulmonary:     Effort: Pulmonary effort is  normal.     Breath sounds: Normal breath sounds. No wheezing or rales.  Abdominal:     General: Abdomen is flat.     Palpations: Abdomen is soft.     Tenderness: There is generalized abdominal tenderness.     Hernia: A hernia is present. Hernia is present in the umbilical area (reducible).  Musculoskeletal:        General: No swelling. Normal range of motion.     Cervical back: Neck supple.     Comments: Dialysis fistula in LUE with palpable thrill  Skin:    General: Skin is warm and dry.  Neurological:     General: No focal deficit present.     Mental Status: He is alert.  Psychiatric:        Mood and Affect: Mood normal.     ED Results / Procedures / Treatments   EKG EKG Interpretation  Date/Time:  Tuesday Feb 11 2023 05:21:50 EDT Ventricular Rate:  82 PR Interval:  154 QRS Duration: 96 QT Interval:  379 QTC Calculation: 443 R Axis:   68 Text Interpretation: Sinus rhythm Probable left ventricular hypertrophy Abnormal T, consider ischemia, lateral leads Anterior ST elevation, probably due to LVH No significant change since last tracing Confirmed by Susy Frizzle [phone]) on 02/11/2023 6:00:37 AM  Procedures Procedures  Medications Ordered in the ED Medications  metroNIDAZOLE (FLAGYL) IVPB 500 mg (500 mg Intravenous New Bag/Given 02/11/23 0641)  vancomycin (VANCOCIN) IVPB 1000 mg/200 mL premix (has no administration in time range)  ceFEPIme (MAXIPIME) 2 g in sodium chloride 0.9 % 100 mL IVPB (2 g Intravenous New Bag/Given 02/11/23 0648)  fentaNYL (SUBLIMAZE) injection 50 mcg (50 mcg Intravenous Given 02/11/23 0553)  acetaminophen (TYLENOL) tablet 1,000 mg (1,000 mg Oral Given 02/11/23 0608)  iohexol (OMNIPAQUE) 350 MG/ML injection 100 mL (60 mLs Intravenous Contrast Given 02/11/23 6045)    Initial Impression and Plan  Patient here with worsening abdominal pain after recent embolization of AV fistula in liver also found to he febrile and hypoxic. He has not gone to dialysis  since leaving the hospital. Consider PNA, post-op infection, UTI, or other source of fever. I discussed the case with Dr. Llana Aliment, Radiology, who recommends repeating his CTA but also adding venous phase images to more completely evaluate his abdomen. Pain meds for comfort. APAP for fever.   ED Course   Clinical Course as of 02/11/23 0714  Tue Feb 11, 2023  0620 I personally viewed the images from radiology studies and agree with radiologist interpretation: CXR is neg for acute process [CS]  0621 CBC shows marked increase in WBC from recent admission. Hgb is stable. Given fever and WBC, will initiate Sepsis protocol, broad spectrum Abx for unknown source.  [CS]  0622 CMP consistent  with ESRD, mild acidosis and hyperkalemia. Hyponatremia is worsened. Mild increase in LFTs from previous. Lactic acid is normal.  [CS]  1610 I personally viewed the images from radiology studies and agree with radiologist interpretation: CTA images reviewed with Dr. Llana Aliment. No acute finding or post-procedure complication in abdomen. Basilar atelectasis and/or infiltrate may be cause of his fever and hypoxia. Patient is resting comfortably now. He is amenable to readmission. Awaiting UA, care of the patient will be signed out to the oncoming team at the change of shift.   [CS]    Clinical Course User Index [CS] Pollyann Savoy, MD     MDM Rules/Calculators/A&P Medical Decision Making Problems Addressed: Community acquired pneumonia of right lower lobe of lung: acute illness or injury Sepsis without acute organ dysfunction, due to unspecified organism [facility]): acute illness or injury  Amount and/or Complexity of Data Reviewed Labs: ordered. Decision-making details documented in ED Course. Radiology: ordered and independent interpretation performed. Decision-making details documented in ED Course. ECG/medicine tests: ordered and independent interpretation performed. Decision-making details documented in ED  Course.  Risk OTC drugs. Prescription drug management. Parenteral controlled substances. Decision regarding hospitalization.     Final Clinical Impression(s) / ED Diagnoses Final diagnoses:  Community acquired pneumonia of right lower lobe of lung  Sepsis without acute organ dysfunction, due to unspecified organism [facility])    Rx / DC Orders ED Discharge Orders     None        Pollyann Savoy, MD 02/11/23 [phone]

## 2023-02-11 NOTE — ED Triage Notes (Addendum)
Patient c/o  abd pain. Patient unable to stand or walk without assistance. Patient had a procedure done to his liver 2 days ago. Patient is tachypneic without exertion, SPO2 90%.

## 2023-02-11 NOTE — Progress Notes (Signed)
   02/11/23 2228  Vitals  Temp 99.1 F (37.3 C)  Temp Source Oral  BP (!) 172/109  BP Location Right Wrist  BP Method Automatic  Patient Position (if appropriate) Lying  Pulse Rate 98  Pulse Rate Source Monitor  Resp 18  Oxygen Therapy  SpO2 99 %  O2 Device Nasal Cannula  During Treatment Monitoring  Intra-Hemodialysis Comments Tx completed (ended tx without issue, pt tolerated tx well)

## 2023-02-11 NOTE — Consult Note (Addendum)
Reason for Consult:abdominal pain Referring Physician: [name] is an 47 y.o. male.  HPI:  Patient presents with severe abdominal pain.  He came into the hospital on May 12 with right back pain and pleuritic chest pain.  He previously had a pneumothorax and thought it might be this.  This was negative.  However, abdominal imaging revealed a vascular structure in the left hepatic lobe.  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  He left AMA yesterday without receiving dialysis.  He came back in today when he started having severe 10 out of 10 abdominal pain in the epigastric region as well as the lower abdomen.  He states that there were issues with his blood pressure medication and that they wanted to give it to him when he had not eaten anything.  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   The patient reports that this most recent episode was accompanied by nausea given the severity of the pain that he was having. He denies fever/chills.   I evaluated the patient in dialysis.   Past Medical History:  Diagnosis Date   Abnormal EKG 10/10/2021   Alcohol use disorder in remission 11/14/2021   Elevated troponin 10/10/2021   ESRD on hemodialysis (HCC) 10/10/2021   History of migraine 11/14/2021   Hypertension    Hypertensive emergency due to non compliance 10/10/2021   Hypokalemia 10/10/2021   Left arm numbness and pain 10/10/2021   Marijuana use 10/10/2021   Noncompliance    NSAID long-term use 10/10/2021   PRES (posterior reversible encephalopathy syndrome)    Smoker 10/10/2021   Syncope, vasovagal 10/10/2021   Thrombocytopenia (HCC) 10/10/2021   Vision disturbance 11/14/2021    Past Surgical History:  Procedure Laterality Date    AV FISTULA PLACEMENT Left 01/01/2022   Procedure: LEFT ARM BRACHIOCEPHALIC ARTERIOVENOUS  FISTULA CREATION;  Surgeon: Maeola Harman, MD;  Location: [facility] OR;  Service: Vascular;  Laterality: Left;   IR ANGIOGRAM VISCERAL SELECTIVE  02/09/2023   IR ANGIOGRAM VISCERAL SELECTIVE  02/09/2023   IR ANGIOGRAM VISCERAL SELECTIVE  02/09/2023   IR EMBO ART  VEN HEMORR LYMPH EXTRAV  INC GUIDE ROADMAPPING  02/09/2023   IR FLUORO GUIDE CV LINE RIGHT  12/24/2021   IR FLUORO GUIDE CV LINE RIGHT  12/27/2021   IR US GUIDE VASC ACCESS RIGHT  12/24/2021   IR US GUIDE VASC ACCESS RIGHT  02/09/2023   THORACOTOMY Right     Family History  Problem Relation Age of Onset   Hypertension Mother    Cervical cancer Mother    Breast cancer Sister    Sudden Cardiac Death Brother 34   Heart disease Maternal Grandmother    Cancer Maternal Grandmother     Social History:  reports that he has been smoking cigarettes. He started smoking about 26 years ago. He has been smoking an average of .3 packs per day. He does not have any smokeless tobacco history on file. He reports that he does not currently use alcohol after a past usage of about 12.0 standard drinks of alcohol per week. He reports current drug use. Drug: Marijuana.  Allergies:  Allergies  Allergen Reactions   Cucumber Extract Anaphylaxis and Itching   Other Anaphylaxis, Itching, Swelling and Other (See Comments)    The patient CANNOT eat  raw/fresh fruit or vegetables. Reaction starts with ears itching, then progresses from there. He can eat canned versions of these foods, however.   Tomato (Diagnostic) Anaphylaxis and Swelling   Wild Lettuce [Wild Lettuce Extract (Lactuca Virosa)] Anaphylaxis and Swelling   Hydralazine Hives, Itching and Other (See Comments)    SEVERE ITCHING    Medications:  Current Meds  Medication Sig   RENVELA 800 MG tablet Take 800 mg by mouth 3 (three) times daily.     Results for orders placed or performed during the hospital  encounter of 02/11/23 (from the past 48 hour(s))  Comprehensive metabolic panel     Status: Abnormal   Collection Time: 02/11/23  5:47 AM  Result Value Ref Range   Sodium 128 (L) 135 - 145 mmol/L   Potassium 5.9 (H) 3.5 - 5.1 mmol/L   Chloride 94 (L) 98 - 111 mmol/L   CO2 19 (L) 22 - 32 mmol/L   Glucose, Bld 88 70 - 99 mg/dL    Comment: Glucose reference range applies only to samples taken after fasting for at least 8 hours.   BUN 71 (H) 6 - 20 mg/dL   Creatinine, Ser 2.95 (H) 0.61 - 1.24 mg/dL   Calcium 9.6 8.9 - 28.4 mg/dL   Total Protein 7.7 6.5 - 8.1 g/dL   Albumin 3.6 3.5 - 5.0 g/dL   AST 132 (H) 15 - 41 U/L   ALT 116 (H) 0 - 44 U/L   Alkaline Phosphatase 102 38 - 126 U/L   Total Bilirubin 1.6 (H) 0.3 - 1.2 mg/dL   GFR, Estimated 6 (L) >60 mL/min    Comment: (NOTE) Calculated using the CKD-EPI Creatinine Equation (2021)    Anion gap 15 5 - 15    Comment: Performed at [facility], [address], [place], [place] [address]  Lactic acid, plasma     Status: None   Collection Time: 02/11/23  5:47 AM  Result Value Ref Range   Lactic Acid, Venous 1.6 0.5 - 1.9 mmol/L    Comment: Performed at [facility], [address] [place], [place] [address]  CBC with Differential     Status: Abnormal   Collection Time: 02/11/23  5:47 AM  Result Value Ref Range   WBC 28.4 (H) 4.0 - 10.5 K/uL   RBC 3.33 (L) 4.22 - 5.81 MIL/uL   Hemoglobin 10.0 (L) 13.0 - 17.0 g/dL   HCT 66.4 (L) 40.3 - 47.4 %   MCV 88.0 80.0 - 100.0 fL   MCH 30.0 26.0 - 34.0 pg   MCHC 34.1 30.0 - 36.0 g/dL   RDW 25.9 (H) 56.3 - 87.5 %   Platelets 147 (L) 150 - 400 K/uL   nRBC 0.0 0.0 - 0.2 %   Neutrophils Relative % 81 %   Neutro Abs 23.1 (H) 1.7 - 7.7 K/uL   Lymphocytes Relative 3 %   Lymphs Abs 0.8 0.7 - 4.0 K/uL   Monocytes Relative 13 %   Monocytes Absolute 3.7 (H) 0.1 - 1.0 K/uL   Eosinophils Relative 0 %   Eosinophils Absolute 0.0 0.0 - 0.5 K/uL   Basophils Relative  0 %   Basophils Absolute 0.1 0.0 - 0.1 K/uL   WBC Morphology INCREASED BANDS (>20% BANDS)    Smear Review MORPHOLOGY UNREMARKABLE    Immature Granulocytes 3 %   Abs Immature Granulocytes 0.76 (H) 0.00 - 0.07 K/uL   Polychromasia PRESENT     Comment: Performed at [facility],  [address], [place], [place] [address]  Urinalysis, w/ Reflex to Culture (Infection Suspected) -Urine, Clean Catch     Status: Abnormal   Collection Time: 02/11/23  6:50 AM  Result Value Ref Range   Specimen Source URINE, CLEAN CATCH    Color, Urine YELLOW YELLOW   APPearance HAZY (A) CLEAR   Specific Gravity, Urine 1.033 (H) 1.005 - 1.030   pH 5.5 5.0 - 8.0   Glucose, UA NEGATIVE NEGATIVE mg/dL   Hgb urine dipstick TRACE (A) NEGATIVE   Bilirubin Urine NEGATIVE NEGATIVE   Ketones, ur NEGATIVE NEGATIVE mg/dL   Protein, ur 604 (A) NEGATIVE mg/dL   Nitrite NEGATIVE NEGATIVE   Leukocytes,Ua NEGATIVE NEGATIVE   RBC / HPF 0-5 0 - 5 RBC/hpf   WBC, UA 6-10 0 - 5 WBC/hpf    Comment:        Reflex urine culture not performed if WBC <=10, OR if Squamous epithelial cells >5. If Squamous epithelial cells >5 suggest recollection.    Bacteria, UA NONE SEEN NONE SEEN   Squamous Epithelial / HPF 0-5 0 - 5 /HPF    Comment: Performed at [facility], [address], [place], [place] [address]  Lactic acid, plasma     Status: None   Collection Time: 02/11/23  8:50 AM  Result Value Ref Range   Lactic Acid, Venous 1.3 0.5 - 1.9 mmol/L    Comment: Performed at [facility], [address] [place], [place] [address]  Protime-INR     Status: Abnormal   Collection Time: 02/11/23  8:50 AM  Result Value Ref Range   Prothrombin Time 19.4 (H) 11.4 - 15.2 seconds   INR 1.6 (H) 0.8 - 1.2    Comment: (NOTE) INR goal varies based on device and disease states. Performed at [facility], [address] [place], [place] [address]   APTT     Status:  Abnormal   Collection Time: 02/11/23  8:50 AM  Result Value Ref Range   aPTT 40 (H) 24 - 36 seconds    Comment:        IF BASELINE aPTT IS ELEVATED, SUGGEST PATIENT RISK ASSESSMENT BE USED TO DETERMINE APPROPRIATE ANTICOAGULANT THERAPY. Performed at [facility], [address], [place], [place] [address]   SARS Coronavirus 2 by RT PCR (hospital order, performed in [facility] hospital lab) *cepheid single result test* Anterior Nasal Swab     Status: None   Collection Time: 02/11/23 10:18 AM   Specimen: Anterior Nasal Swab  Result Value Ref Range   SARS Coronavirus 2 by RT PCR NEGATIVE NEGATIVE    Comment: (NOTE) SARS-CoV-2 target nucleic acids are NOT DETECTED.  The SARS-CoV-2 RNA is generally detectable in upper and lower respiratory specimens during the acute phase of infection. The lowest concentration of SARS-CoV-2 viral copies this assay can detect is 250 copies / mL. A negative result does not preclude SARS-CoV-2 infection and should not be used as the sole basis for treatment or other patient management decisions.  A negative result may occur with improper specimen collection / handling, submission of specimen other than nasopharyngeal swab, presence of viral mutation(s) within the areas targeted by this assay, and inadequate number of viral copies (<250 copies / mL). A negative result must be combined with clinical observations, patient history, and epidemiological information.  Fact Sheet for Patients:   RoadLapTop.co.za  Fact Sheet for Healthcare Providers: http://kim-miller.com/  This test is not yet approved or  cleared by the  [employer] and has been authorized for detection and/or diagnosis of SARS-CoV-2 by FDA under an TEFL teacher (EUA).  This EUA will remain in effect (meaning this test can be used) for the duration of the COVID-19 declaration under Section 564(b)(1) of the  Act, 21 U.S.C. section 360bbb-3(b)(1), unless the authorization is terminated or revoked sooner.  Performed at [facility], [address], [place], [place] [address]   Procalcitonin     Status: None   Collection Time: 02/11/23  4:48 PM  Result Value Ref Range   Procalcitonin 148.98 ng/mL    Comment:        Interpretation: PCT >= 10 ng/mL: Important systemic inflammatory response, almost exclusively due to severe bacterial sepsis or septic shock. (NOTE)       Sepsis PCT Algorithm           Lower Respiratory Tract                                      Infection PCT Algorithm    ----------------------------     ----------------------------         PCT < 0.25 ng/mL                PCT < 0.10 ng/mL          Strongly encourage             Strongly discourage   discontinuation of antibiotics    initiation of antibiotics    ----------------------------     -----------------------------       PCT 0.25 - 0.50 ng/mL            PCT 0.10 - 0.25 ng/mL               OR       >80% decrease in PCT            Discourage initiation of                                            antibiotics      Encourage discontinuation           of antibiotics    ----------------------------     -----------------------------         PCT >= 0.50 ng/mL              PCT 0.26 - 0.50 ng/mL                AND       <80% decrease in PCT             Encourage initiation of                                             antibiotics       Encourage continuation           of antibiotics    ----------------------------     -----------------------------        PCT >= 0.50 ng/mL                  PCT > 0.50 ng/mL  AND         increase in PCT                  Strongly encourage                                      initiation of antibiotics    Strongly encourage escalation           of antibiotics                                     -----------------------------                                            PCT <= 0.25 ng/mL                                                 OR                                        > 80% decrease in PCT                                      Discontinue / Do not initiate                                             antibiotics  Performed at [facility], [address], [place], [place] [address]   C-reactive protein     Status: Abnormal   Collection Time: 02/11/23  4:48 PM  Result Value Ref Range   CRP 30.9 (H) <1.0 mg/dL    Comment: Performed at [facility], [address] [place], [place] [address]    CT Angio Chest/Abd/Pel for Dissection W and/or Wo Contrast  Result Date: 02/11/2023 CLINICAL DATA:  47 year old male with history of worsening pain, fever and hypoxia. Recent history of hepatic arteriovenous malformation coil procedure. EXAM: CT ANGIOGRAPHY CHEST, ABDOMEN AND PELVIS TECHNIQUE: Non-contrast CT of the chest was initially obtained. Multidetector CT imaging through the chest, abdomen and pelvis was performed using the standard protocol during bolus administration of intravenous contrast. Multiplanar reconstructed images and MIPs were obtained and reviewed to evaluate the vascular anatomy. RADIATION DOSE REDUCTION: This exam was performed according to the departmental dose-optimization program which includes automated exposure control, adjustment of the mA and/or kV according to patient size and/or use of iterative reconstruction technique. CONTRAST:  60mL OMNIPAQUE IOHEXOL 350 MG/ML SOLN COMPARISON:  CTA of the chest, abdomen and pelvis 02/09/2023. FINDINGS: CTA CHEST FINDINGS Cardiovascular: Heart size is mildly enlarged. There is no significant pericardial fluid, thickening or pericardial calcification. No acute abnormality of the thoracic aorta or the great vessels of the mediastinum. Ascending thoracic aorta, mid aortic arch and descending thoracic aorta measure 3.2  cm, 3.0 cm and 2.3 cm in diameter respectively. No evidence of  thoracic aortic dissection. Mediastinum/Nodes: Multiple prominent borderline enlarged mediastinal and bilateral hilar lymph nodes measuring up to 1.4 cm in short axis in the subcarinal nodal station, nonspecific, but similar to the prior study, and favored to be benign. Esophagus is unremarkable in appearance. No axillary lymphadenopathy. Lungs/Pleura: Dependent opacities are noted in the lungs bilaterally (left-greater-than-right), which appear predominantly atelectatic, although less well-defined areas of ground-glass attenuation and airspace consolidation are noted in the extreme lung bases, which may suggest areas of infectious consolidation and inflammation. No pleural effusions. Pleural nodularity is noted in the right hemithorax, best appreciated on axial images 48 and 49 of series 4, where the largest pleural nodule measures 1.7 x 1.6 cm (40 HU). No left-sided pleural nodularity is noted. Mild diffuse bronchial wall thickening with mild centrilobular and paraseptal emphysema. No definite suspicious appearing pulmonary nodules or masses are noted. Musculoskeletal: There are no aggressive appearing lytic or blastic lesions noted in the visualized portions of the skeleton. Review of the MIP images confirms the above findings. CTA ABDOMEN AND PELVIS FINDINGS VASCULAR Aorta: Aortic atherosclerosis. Normal caliber aorta without aneurysm, dissection, vasculitis or significant stenosis. Celiac: Embolization coils are noted in the region of the of the left hepatic artery. Celiac axis and branches are otherwise without evidence of aneurysm, dissection, vasculitis or significant stenosis. SMA: Patent without evidence of aneurysm, dissection, vasculitis or significant stenosis. Renals: Both renal arteries are patent without evidence of aneurysm, dissection, vasculitis, fibromuscular dysplasia or significant stenosis. IMA: Patent without evidence of aneurysm, dissection, vasculitis or significant stenosis. Inflow: Patent  without evidence of significant stenosis. Mild aneurysmal dilatation of the right common iliac artery (1.7 cm in diameter). Short segment dissection of the distal left common iliac artery which propagates into the left external and internal iliac arteries over a very short distance, and is associated with mild aneurysmal dilatation of the left internal iliac artery (axial image 231 of series 4) measuring 1.1 cm in diameter, similar to the prior study. Veins: No obvious venous abnormality within the limitations of this arterial phase study. Review of the MIP images confirms the above findings. NON-VASCULAR Hepatobiliary: Mild enlargement and heterogeneous attenuation in the left lobe of the liver predominantly in segment 3 related to prior intrahepatic hemorrhage and subsequent embolization. Perfusion throughout this region appears slightly diminished, likely secondary to embolization of branches of the left hepatic artery, without definitive evidence of persistent perfusion of the previously embolized pseudoaneurysm. No well organized fluid collection is confidently identified at this time. Liver is otherwise diffusely enlarged, but no focal hepatic lesions are noted. No intra or extrahepatic biliary ductal dilatation. High attenuation material within the lumen of the gallbladder is compatible with vicarious excretion of contrast material related to prior contrast-enhanced CT examinations and embolization procedure. Pancreas: No pancreatic mass. No pancreatic ductal dilatation. No pancreatic or peripancreatic fluid collections or inflammatory changes. Spleen: Unremarkable. Adrenals/Urinary Tract: Bilateral kidneys and adrenal glands are normal in appearance. No hydroureteronephrosis. Urinary bladder is nearly completely decompressed and otherwise unremarkable in appearance. Stomach/Bowel: The appearance of the stomach is normal. There is no pathologic dilatation of small bowel or colon. Normal appendix. Lymphatic:  No lymphadenopathy noted in the abdomen or pelvis. Reproductive: Prostate gland is unremarkable in appearance. Other: Small to moderate volume of ascites, most evident adjacent to the liver and in the low anatomic pelvis. This is intermediate to high attenuation, indicative of internal blood products. Musculoskeletal: There are no aggressive appearing  lytic or blastic lesions noted in the visualized portions of the skeleton. Review of the MIP images confirms the above findings. IMPRESSION: 1. 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 2. Small to moderate volume of intermediate to high attenuation ascites in the peritoneal cavity reflecting the presence of blood products within the ascites. No well organized fluid collection identified to suggest abscess in the abdomen or pelvis. 3. Dependent opacities in the lung bases predominantly atelectatic, although some areas of airspace consolidation are evident, which could reflect resolving infection or sequela of aspiration. 4. Pleural nodularity in the posterior aspect of the right hemithorax. This is of uncertain etiology and significance, but the possibility of malignancy is not entirely excluded. Follow-up contrast-enhanced chest CT is recommended in 2 months to re-evaluate this finding and ensure the stability or regression. At the time of the follow-up imaging, attention to multiple borderline enlarged mediastinal and bilateral hilar lymph nodes is also suggested to ensure stability or regression. 5. Cardiomegaly. 6. Aortic atherosclerosis with mild aneurysmal dilatation of the right common iliac artery, and short segment dissection of the distal left common iliac artery which propagates into the proximal aspects of the left external and internal iliac arteries, with mild aneurysmal dilatation of the proximal left internal iliac artery, similar to the  prior study, as above. 7. Mild diffuse bronchial wall thickening with mild centrilobular and paraseptal emphysema; imaging findings suggestive of underlying COPD. Electronically Signed   By: Trudie Reed M.D.   On: 02/11/2023 06:59   DG Chest 1 View  Result Date: 02/11/2023 CLINICAL DATA:  Shortness of breath, fever and hypoxia. 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 EXAM: CHEST  1 VIEW COMPARISON:  CTA chest 02/09/2023 FINDINGS: There is mild cardiomegaly. Vascular markings are normal caliber. The lungs show chronic changes and linear platelike basilar atelectasis without appreciable infiltrates. A single surgical clip superimposes in the right upper to mid lung field. The mediastinum is normally outlined. The thoracic cage is intact.  There is overlying monitor wiring. Compare: Overall aeration seems unchanged. IMPRESSION: Chronic lung changes with no evidence of acute chest process. Mild cardiomegaly. Electronically Signed   By: Almira Bar M.D.   On: 02/11/2023 06:12    Review of Systems  All other systems reviewed and are negative.    Blood pressure (!) 186/93, pulse 84, temperature 99 F (37.2 C), resp. rate (!) 23, height 5\' 9"  (1.753 m), weight 66.8 kg, SpO2 100 %. Physical Exam Vitals reviewed.  Constitutional:      General: Distressed: sleeping, awakened with difficulty.     Appearance: He is well-developed. He is ill-appearing. He is not toxic-appearing or diaphoretic.  HENT:     Head: Normocephalic and atraumatic.  Eyes:     General: No scleral icterus.    Extraocular Movements: Extraocular movements intact.     Pupils: Pupils are equal, round, and reactive to light.  Cardiovascular:     Rate and Rhythm: Normal rate and regular rhythm.  Pulmonary:     Effort: Pulmonary effort is normal.  Chest:     Chest wall: No tenderness.  Abdominal:     General: Abdomen is protuberant. There are no signs of injury.     Palpations: There is  hepatomegaly. There is no shifting dullness, fluid wave, splenomegaly or mass.     Tenderness: There is abdominal tenderness (mild tenderness mid abdomen) in the periumbilical  area. There is no guarding or rebound.     Hernia: No hernia is present.  Skin:    General: Skin is warm and dry.     Capillary Refill: Capillary refill takes 2 to 3 seconds.     Coloration: Skin is not cyanotic or jaundiced.  Neurological:     General: No focal deficit present.     Mental Status: He is alert and oriented to person, place, and time.  Psychiatric:        Mood and Affect: Mood is anxious.        Behavior: Behavior normal.      Assessment/Plan: Abdominal pain Hemoperitoneum Hepatic artery aneurysm, s/p coiling ESRD on HD.  HTN, cause of ESRD Substance abuse Anemia of chronic disease.   Clear ok as I don't anticipate surgical intervention. Recheck labs Pain likely secondary to mild hemoperitoneum and possibly sub segmental liver necrosis.  If there were severe liver necrosis, I would expect the transaminases to be much higher.    Symptom control   High complexity of medical decision making   Maudry Diego, MD, FACS, FSSO Surgical Oncology, General Surgery, [facility] and [facility], [place] [phone] for weekday/non holidays Check [email or web address] for coverage night/weekend/holidays

## 2023-02-11 NOTE — ED Notes (Signed)
Patient oxygen saturation 85-89% during triage, this nurse places patient on 2L Roy Randolph, Saturation increases to 94%

## 2023-02-11 NOTE — ED Provider Notes (Signed)
Physical Exam  BP 131/68   Pulse 78   Temp (!) 101.3 F (38.5 C)   Resp (!) 24   Ht 5\' 9"  (1.753 m)   Wt 65.8 kg   SpO2 91%   BMI 21.41 kg/m   Physical Exam  Procedures  .Critical Care  Performed by: Derwood Kaplan, MD Authorized by: Derwood Kaplan, MD   Critical care provider statement:    Critical care time (minutes):  32   Critical care time was exclusive of:  Separately billable procedures and treating other patients   Critical care was necessary to treat or prevent imminent or life-threatening deterioration of the following conditions:  Sepsis, renal failure and respiratory failure   Critical care was time spent personally by me on the following activities:  Discussions with consultants, evaluation of patient's response to treatment, examination of patient, ordering and review of laboratory studies, ordering and review of radiographic studies, pulse oximetry, re-evaluation of patient's condition, review of old charts and obtaining history from patient or surrogate   ED Course / MDM   Clinical Course as of 02/11/23 0842  Tue Feb 11, 2023  0620 I personally viewed the images from radiology studies and agree with radiologist interpretation: CXR is neg for acute process [CS]  0621 CBC shows marked increase in WBC from recent admission. Hgb is stable. Given fever and WBC, will initiate Sepsis protocol, broad spectrum Abx for unknown source.  [CS]  0622 CMP consistent with ESRD, mild acidosis and hyperkalemia. Hyponatremia is worsened. Mild increase in LFTs from previous. Lactic acid is normal.  [CS]  1610 I personally viewed the images from radiology studies and agree with radiologist interpretation: CTA images reviewed with Dr. Llana Aliment. No acute finding or post-procedure complication in abdomen. Basilar atelectasis and/or infiltrate may be cause of his fever and hypoxia. Patient is resting comfortably now. He is amenable to readmission. Awaiting UA, care of the patient will be  signed out to the oncoming team at the change of shift.   [CS]    Clinical Course User Index [CS] Pollyann Savoy, MD   Medical Decision Making Amount and/or Complexity of Data Reviewed Labs: ordered. Radiology: ordered.  Risk OTC drugs. Prescription drug management. Decision regarding hospitalization.    I had assumed care of this patient from Dr. Bernette Mayers at 7:05 AM. Briefly, patient has history of ESRD on hemodialysis and he gets dialysis on Monday-Tuesday-Thursday-Saturday, last dialysis was on Saturday.  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  He was in the ICU because he required Cleviprex drip.  Comes in with abdominal pain.  On my assessment, patient noted to be tachypneic, slightly uncomfortable.  His abdominal exam is showing upper quadrant tenderness, no peritonitis.  Patient states that it hurts to breathe.  He denies any chest pain.  He does feel slightly short of breath, but that is because it hurts to breathe.  He denies any new cough, headache, new rashes.  I reviewed patient's admission documentation and discharge/AMA documentation.  It appears that when patient left yesterday, he was febrile.  He has received broad-spectrum antibiotics.  Patient is on 2 L of oxygen, saturating about 91%.  He has bibasilar rales only.  CT scan reviewed.  There is evidence of moderate to small ascites, previous CT scan did not have significant ascites.  Patient's labs indicate elevated white count with bandemia.  Patient also has rising bilirubin, LFTs.  Concerns for sepsis  at this time.  Patient's blood pressure is normal, but that could be abnormal for him given that he normally runs high and he has not received any BP medications or dialysis.  I have discussed the case with medicine service for admission.  They have requested that we discussed the case with ICU service.  I spoke with Dr. Johny Drilling  with ICU, he thinks patient can be admitted to progressive floor.  Critical care can be consulted if needed.  I have also consulted nephrology and discussed the case with Dr. Signe Colt.  Patient is on their list for dialysis.  Patient has slightly elevated potassium.  Will give him Lokelma.  He will also receive calcium gluconate and bicarb.  Patient has a low sodium of 128.  At this time, I think the benefit outweighs the risk of treating/temporizing hyperkalemia as it is not clear when patient will be transferred out of the ER and when he will receive his dialysis.  He does not appear overtly volume overloaded.     Derwood Kaplan, MD 02/11/23 [phone]

## 2023-02-11 NOTE — Progress Notes (Signed)
Patient back to room from HD. 

## 2023-02-12 DIAGNOSIS — R109 Unspecified abdominal pain: Secondary | ICD-10-CM | POA: Diagnosis not present

## 2023-02-12 DIAGNOSIS — A419 Sepsis, unspecified organism: Secondary | ICD-10-CM | POA: Diagnosis not present

## 2023-02-12 DIAGNOSIS — I10 Essential (primary) hypertension: Secondary | ICD-10-CM | POA: Diagnosis not present

## 2023-02-12 LAB — C-REACTIVE PROTEIN: CRP: 39.8 mg/dL — ABNORMAL HIGH (ref <1.0)

## 2023-02-12 LAB — COMPREHENSIVE METABOLIC PANEL
ALT: 129 U/L — ABNORMAL HIGH (ref 0–44)
AST: 163 U/L — ABNORMAL HIGH (ref 15–41)
Albumin: 2.4 g/dL — ABNORMAL LOW (ref 3.5–5.0)
Alkaline Phosphatase: 96 U/L (ref 38–126)
Anion gap: 14 (ref 5–15)
BUN: 47 mg/dL — ABNORMAL HIGH (ref 6–20)
CO2: 25 mmol/L (ref 22–32)
Calcium: 8.9 mg/dL (ref 8.9–10.3)
Chloride: 94 mmol/L — ABNORMAL LOW (ref 98–111)
Creatinine, Ser: 6.88 mg/dL — ABNORMAL HIGH (ref 0.61–1.24)
GFR, Estimated: 9 mL/min — ABNORMAL LOW (ref >60)
Glucose, Bld: 86 mg/dL (ref 70–99)
Potassium: 4.9 mmol/L (ref 3.5–5.1)
Sodium: 133 mmol/L — ABNORMAL LOW (ref 135–145)
Total Bilirubin: 1.5 mg/dL — ABNORMAL HIGH (ref 0.3–1.2)
Total Protein: 7.3 g/dL (ref 6.5–8.1)

## 2023-02-12 LAB — CBC WITH DIFFERENTIAL/PLATELET
Abs Immature Granulocytes: 0 10*3/uL (ref 0.00–0.07)
Basophils Absolute: 0 10*3/uL (ref 0.0–0.1)
Basophils Relative: 0 %
Eosinophils Absolute: 0 10*3/uL (ref 0.0–0.5)
Eosinophils Relative: 0 %
HCT: 27.9 % — ABNORMAL LOW (ref 39.0–52.0)
Hemoglobin: 9.4 g/dL — ABNORMAL LOW (ref 13.0–17.0)
Lymphocytes Relative: 2 %
Lymphs Abs: 0.4 10*3/uL — ABNORMAL LOW (ref 0.7–4.0)
MCH: 29.1 pg (ref 26.0–34.0)
MCHC: 33.7 g/dL (ref 30.0–36.0)
MCV: 86.4 fL (ref 80.0–100.0)
Monocytes Absolute: 1.6 10*3/uL — ABNORMAL HIGH (ref 0.1–1.0)
Monocytes Relative: 8 %
Neutro Abs: 17.9 10*3/uL — ABNORMAL HIGH (ref 1.7–7.7)
Neutrophils Relative %: 90 %
Platelets: 148 10*3/uL — ABNORMAL LOW (ref 150–400)
RBC: 3.23 MIL/uL — ABNORMAL LOW (ref 4.22–5.81)
RDW: 17 % — ABNORMAL HIGH (ref 11.5–15.5)
WBC: 19.9 10*3/uL — ABNORMAL HIGH (ref 4.0–10.5)
nRBC: 0 % (ref 0.0–0.2)
nRBC: 0 /100 WBC

## 2023-02-12 LAB — MAGNESIUM: Magnesium: 1.8 mg/dL (ref 1.7–2.4)

## 2023-02-12 LAB — CULTURE, BLOOD (ROUTINE X 2)

## 2023-02-12 LAB — PROCALCITONIN: Procalcitonin: >150 ng/mL

## 2023-02-12 LAB — BRAIN NATRIURETIC PEPTIDE: B Natriuretic Peptide: 2236.4 pg/mL — ABNORMAL HIGH (ref 0.0–100.0)

## 2023-02-12 MED ORDER — LIDOCAINE-PRILOCAINE 2.5-2.5 % EX CREA: 1.0000 | TOPICAL_CREAM | CUTANEOUS | Status: DC | PRN

## 2023-02-12 MED ORDER — ALTEPLASE 2 MG IJ SOLR: 2.0000 mg | Freq: Once | INTRAMUSCULAR | Status: DC | PRN

## 2023-02-12 MED ORDER — HYDROXYZINE HCL 25 MG PO TABS
25.0000 mg | ORAL_TABLET | Freq: Three times a day (TID) | ORAL | Status: DC | PRN
  Administered 2023-02-12: 25 mg via ORAL
  Filled 2023-02-12: qty 1

## 2023-02-12 MED ORDER — LIDOCAINE HCL (PF) 1 % IJ SOLN: 5.0000 mL | INTRAMUSCULAR | Status: DC | PRN

## 2023-02-12 MED ORDER — HEPARIN SODIUM (PORCINE) 1000 UNIT/ML DIALYSIS: 1000.0000 [IU] | INTRAMUSCULAR | Status: DC | PRN

## 2023-02-12 MED ORDER — POLYETHYLENE GLYCOL 3350 17 G PO PACK
17.0000 g | PACK | Freq: Two times a day (BID) | ORAL | Status: DC
  Administered 2023-02-12 (×2): 17 g via ORAL
  Filled 2023-02-12 (×2): qty 1

## 2023-02-12 MED ORDER — DOCUSATE SODIUM 100 MG PO CAPS
100.0000 mg | ORAL_CAPSULE | Freq: Two times a day (BID) | ORAL | Status: DC
  Administered 2023-02-12: 100 mg via ORAL
  Filled 2023-02-12: qty 1

## 2023-02-12 MED ORDER — SENNOSIDES-DOCUSATE SODIUM 8.6-50 MG PO TABS
2.0000 | ORAL_TABLET | Freq: Every day | ORAL | Status: DC
  Administered 2023-02-12: 2 via ORAL
  Filled 2023-02-12: qty 2

## 2023-02-12 MED ORDER — PENTAFLUOROPROP-TETRAFLUOROETH EX AERO: 1.0000 | INHALATION_SPRAY | CUTANEOUS | Status: DC | PRN

## 2023-02-12 NOTE — TOC Initial Note (Signed)
Transition of Care [facility]) - Initial/Assessment Note    Patient Details  Name: Roy Randolph MRN: [id number] Date of Birth: 09/25/1976  [facility] CM/SW Contact:    Lawerance Sabal, RN Phone Number: 02/12/2023, 10:14 AM  Clinical Narrative:                  Spoke w patient at bedside.  He states that plans to discharge to home, lives with his sister who is able to assist with transportation at discharge.  Patient denies barriers to accessing medications or primary care.  PCP j[facility]son at [facility] and [facility] on [address].   Would recommend TOC pharmacy at DC due to issues on noncompliance.    Expected Discharge Plan: Home/Self Care Barriers to Discharge: Continued Medical Work up   Patient Goals and CMS Choice Patient states their goals for this hospitalization and ongoing recovery are:: return home          Expected Discharge Plan and Services   Discharge Planning Services: CM Consult   Living arrangements for the past 2 months: Single Family Home                                      Prior Living Arrangements/Services Living arrangements for the past 2 months: Single Family Home Lives with:: Relatives (sister)                   Activities of Daily Living Home Assistive Devices/Equipment: None ADL Screening (condition at time of admission) Patient's cognitive ability adequate to safely complete daily activities?: Yes Is the patient deaf or have difficulty hearing?: No Does the patient have difficulty seeing, even when wearing glasses/contacts?: No Does the patient have difficulty concentrating, remembering, or making decisions?: No Patient able to express need for assistance with ADLs?: Yes Does the patient have difficulty dressing or bathing?: No Independently performs ADLs?: Yes (appropriate for developmental age) Does the patient have difficulty walking or climbing stairs?: No Weakness of Legs: None Weakness of Arms/Hands:  None  Permission Sought/Granted                  Emotional Assessment              Admission diagnosis:  Sepsis due to undetermined organism (HCC) [A41.9] Community acquired pneumonia of right lower lobe of lung [J18.9] Sepsis without acute organ dysfunction, due to unspecified organism [facility]) [A41.9] Patient Active Problem List   Diagnosis Date Noted   Sepsis due to undetermined organism (HCC) 02/11/2023   Hypertensive emergency 02/09/2023   Moderate major depression (HCC) 12/14/2022   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 [place] [address] Phone: [phone] Fax: [phone]  [place] [facility] [address] [place] [address] Phone: [phone] Fax: [phone]  [id number] [place],  [place] - [address] AT [facility] OF [address] & [facility] [address] [place] [address] Phone: [phone] Fax: [phone]  [id number] - [place], [place] - [address] AT CORNER OF [facility] [address] [place] [address] Phone: [phone] Fax: [phone]     Social Determinants of Health (SDOH) Social History: SDOH Screenings   Food Insecurity: No Food Insecurity (02/09/2023)  Housing: Low Risk  (02/09/2023)  Transportation Needs: No Transportation Needs (02/09/2023)  Utilities: Not At  Risk (02/09/2023)  Depression (PHQ2-9): High Risk (12/12/2022)  Tobacco Use: High Risk (02/11/2023)   SDOH Interventions:     Readmission Risk Interventions    12/28/2021    1:23 PM  Readmission Risk Prevention Plan  Transportation Screening Complete  PCP or Specialist Appt within 5-7 Days Complete  Home Care Screening Complete  Medication Review (RN CM) Complete

## 2023-02-12 NOTE — Progress Notes (Signed)
Referring Physician(s): Dr. 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 Patient was seen in follow-up on 5/13 by IR service and described some mild RUQ soreness at that time. Patient subsequently left the [facility], but presented to [facility] the morning of 02/11/23 with severe abdominal pain. On presentation, patient was febrile, had marked leukocytosis, and imaging revealed reactive/bloody ascites. No indication for IR procedure was identified.     Subjective: Patient in bed resting talking on the phone. He endorses significant diffuse abdominal cramping. He also states he is very hungry and expresses frustration that his food had not arrived. No signs of discomfort or distress observed.  Allergies: Cucumber extract, Other, Tomato (diagnostic), Wild lettuce [wild lettuce extract (lactuca virosa)], and Hydralazine  Medications: Prior to Admission medications   Medication Sig Start Date End Date Taking? Authorizing Provider  RENVELA 800 MG tablet Take 800 mg by mouth 3 (three) times daily. 01/14/23  Yes [provider]  acetaminophen (TYLENOL) 325 MG tablet Take 2 tablets (650 mg total) by mouth every 6 (six) hours as needed for fever or mild pain. 01/02/22   Katsadouros, Vasilios, MD  amLODipine (NORVASC) 10 MG tablet Take 1 tablet (10 mg total) by mouth daily. 02/15/22   Ellison Carwin, MD  calcium acetate (PHOSLO) 667 MG capsule Take 1 capsule (667 mg total) by mouth 3 (three) times daily with meals. Patient not taking: Reported on 02/09/2023 01/18/22   Dellia Cloud, MD  cloNIDine (CATAPRES) 0.3 MG tablet Take 1 tablet (0.3 mg total) by mouth 3 (three) times daily.  12/05/22   Jacalyn Lefevre, MD  Darbepoetin Alfa (ARANESP) 300 MCG/0.6ML SOSY injection Inject 0.6 mLs (300 mcg total) into the vein every dialysis (Tu,Th,Sat). 01/02/22   Katsadouros, Vasilios, MD  labetalol (NORMODYNE) 200 MG tablet Take 2 tablets (400 mg total) by mouth 2 (two) times daily. 02/15/22   Ellison Carwin, MD  mirtazapine (REMERON) 7.5 MG tablet Take 1 tablet (7.5 mg total) by mouth at bedtime. Start after weaned off Zoloft. 12/12/22   Marcine Matar, MD  multivitamin (RENA-VIT) TABS tablet Take 1 tablet by mouth daily.    [provider]  pantoprazole (PROTONIX) 40 MG tablet Take 1 tablet (40 mg total) by mouth daily. 02/15/22   Ellison Carwin, MD  sertraline (ZOLOFT) 50 MG tablet Take 1 tablet (50 mg total) by mouth daily. Patient not taking: Reported on 02/09/2023 02/15/22   Ellison Carwin, MD  torsemide (DEMADEX) 100 MG tablet Take 100 mg by mouth daily. Take after dialysis on dialysis days    [provider]  Vitamin D, Ergocalciferol, (DRISDOL) 1.25 MG (50000 UNIT) CAPS capsule Take 1 capsule (50,000 Units total) by mouth once a week. Patient not taking: Reported on 02/09/2023 02/07/22   Penninger, Renick, PA     Vital Signs: BP 111/70 (BP Location: Right Arm)   Pulse 76   Temp 97.7 F (36.5 C) (Oral)   Resp 19   Ht 5\' 9"  (1.753 m)   Wt 141 lb 12.1 oz (64.3 kg)   SpO2 90%   BMI 20.93 kg/m   Physical Exam Constitutional:      General: He is not in acute distress.    Appearance: He is not ill-appearing.  Pulmonary:     Effort: Pulmonary effort is normal.  Abdominal:     General: There is distension.  Skin:    General: Skin is warm and dry.  Neurological:     Mental Status: He is alert and oriented to person, place, and time.     Imaging: CT Angio Chest/Abd/Pel for Dissection W and/or Wo Contrast  Result Date: 02/11/2023 CLINICAL DATA:  47 year old male with history of worsening pain, fever and hypoxia. Recent history of hepatic arteriovenous  malformation coil procedure. EXAM: CT ANGIOGRAPHY CHEST, ABDOMEN AND PELVIS TECHNIQUE: Non-contrast CT of the chest was initially obtained. Multidetector CT imaging through the chest, abdomen and pelvis was performed using the standard protocol during bolus administration of intravenous contrast. Multiplanar reconstructed images and MIPs were obtained and reviewed to evaluate the vascular anatomy. RADIATION DOSE REDUCTION: This exam was performed according to the departmental dose-optimization program which includes automated exposure control, adjustment of the mA and/or kV according to patient size and/or use of iterative reconstruction technique. CONTRAST:  60mL OMNIPAQUE IOHEXOL 350 MG/ML SOLN COMPARISON:  CTA of the chest, abdomen and pelvis 02/09/2023. FINDINGS: CTA CHEST FINDINGS Cardiovascular: Heart size is mildly enlarged. There is no significant pericardial fluid, thickening or pericardial calcification. No acute abnormality of the thoracic aorta or the great vessels of the mediastinum. Ascending thoracic aorta, mid aortic arch and descending thoracic aorta measure 3.2 cm, 3.0 cm and 2.3 cm in diameter respectively. No evidence of thoracic aortic dissection. Mediastinum/Nodes: Multiple prominent borderline enlarged mediastinal and bilateral hilar lymph nodes measuring up to 1.4 cm in short axis in the subcarinal nodal station, nonspecific, but similar to the prior study, and favored to be benign. Esophagus is unremarkable in appearance. No axillary lymphadenopathy. Lungs/Pleura: Dependent opacities are noted in the lungs bilaterally (left-greater-than-right), which appear predominantly atelectatic, although less well-defined areas of ground-glass attenuation and airspace consolidation are noted in the extreme lung bases, which may suggest areas of infectious consolidation and inflammation. No pleural effusions. Pleural nodularity is noted in the right hemithorax, best appreciated on axial images 48 and 49  of series 4, where the largest pleural nodule measures 1.7 x 1.6 cm (40 HU). No left-sided pleural nodularity is noted. Mild diffuse bronchial wall thickening with mild centrilobular and paraseptal emphysema. No definite suspicious appearing pulmonary nodules or masses are noted. Musculoskeletal: There are no aggressive appearing lytic or blastic lesions noted in the visualized portions of the skeleton. Review of the MIP images confirms the above findings. CTA ABDOMEN AND PELVIS FINDINGS VASCULAR Aorta: Aortic atherosclerosis. Normal caliber aorta without aneurysm, dissection, vasculitis or significant stenosis. Celiac: Embolization coils are noted in the region of the of the left hepatic artery. Celiac axis and branches are otherwise without evidence of aneurysm, dissection, vasculitis or significant stenosis. SMA: Patent without evidence of aneurysm, dissection, vasculitis or significant stenosis. Renals: Both renal arteries are patent without evidence of aneurysm, dissection, vasculitis, fibromuscular dysplasia or significant stenosis. IMA: Patent without evidence of aneurysm, dissection, vasculitis or significant stenosis. Inflow: Patent without evidence of significant stenosis. Mild aneurysmal dilatation of the right common iliac artery (1.7 cm in diameter). Short segment dissection of the distal left common iliac artery which propagates into the left external and internal iliac arteries over a very short distance, and is associated with mild aneurysmal dilatation of the left internal iliac artery (axial image 231 of series 4) measuring 1.1 cm in diameter, similar to the prior study. Veins: No obvious venous abnormality within the limitations of this arterial phase  study. Review of the MIP images confirms the above findings. NON-VASCULAR Hepatobiliary: Mild enlargement and heterogeneous attenuation in the left lobe of the liver predominantly in segment 3 related to prior intrahepatic hemorrhage and subsequent  embolization. Perfusion throughout this region appears slightly diminished, likely secondary to embolization of branches of the left hepatic artery, without definitive evidence of persistent perfusion of the previously embolized pseudoaneurysm. No well organized fluid collection is confidently identified at this time. Liver is otherwise diffusely enlarged, but no focal hepatic lesions are noted. No intra or extrahepatic biliary ductal dilatation. High attenuation material within the lumen of the gallbladder is compatible with vicarious excretion of contrast material related to prior contrast-enhanced CT examinations and embolization procedure. Pancreas: No pancreatic mass. No pancreatic ductal dilatation. No pancreatic or peripancreatic fluid collections or inflammatory changes. Spleen: Unremarkable. Adrenals/Urinary Tract: Bilateral kidneys and adrenal glands are normal in appearance. No hydroureteronephrosis. Urinary bladder is nearly completely decompressed and otherwise unremarkable in appearance. Stomach/Bowel: The appearance of the stomach is normal. There is no pathologic dilatation of small bowel or colon. Normal appendix. Lymphatic: No lymphadenopathy noted in the abdomen or pelvis. Reproductive: Prostate gland is unremarkable in appearance. Other: Small to moderate volume of ascites, most evident adjacent to the liver and in the low anatomic pelvis. This is intermediate to high attenuation, indicative of internal blood products. Musculoskeletal: There are no aggressive appearing lytic or blastic lesions noted in the visualized portions of the skeleton. Review of the MIP images confirms the above findings. IMPRESSION: 1. 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 2. Small to moderate volume of intermediate to high attenuation ascites in the peritoneal cavity reflecting the presence of blood  products within the ascites. No well organized fluid collection identified to suggest abscess in the abdomen or pelvis. 3. Dependent opacities in the lung bases predominantly atelectatic, although some areas of airspace consolidation are evident, which could reflect resolving infection or sequela of aspiration. 4. Pleural nodularity in the posterior aspect of the right hemithorax. This is of uncertain etiology and significance, but the possibility of malignancy is not entirely excluded. Follow-up contrast-enhanced chest CT is recommended in 2 months to re-evaluate this finding and ensure the stability or regression. At the time of the follow-up imaging, attention to multiple borderline enlarged mediastinal and bilateral hilar lymph nodes is also suggested to ensure stability or regression. 5. Cardiomegaly. 6. 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 7. Mild diffuse bronchial wall thickening with mild centrilobular and paraseptal emphysema; imaging findings suggestive of underlying COPD. Electronically Signed   By: Trudie Reed M.D.   On: 02/11/2023 06:59   DG Chest 1 View  Result Date: 02/11/2023 CLINICAL DATA:  Shortness of breath, fever and hypoxia. 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 EXAM: CHEST  1 VIEW COMPARISON:  CTA chest 02/09/2023 FINDINGS: There is mild cardiomegaly. Vascular markings are normal caliber. The lungs show chronic changes and linear platelike basilar atelectasis without appreciable infiltrates. A single surgical clip superimposes in the right upper to mid lung field. The mediastinum is normally outlined. The thoracic cage is  intact.  There is overlying  monitor wiring. Compare: Overall aeration seems unchanged. IMPRESSION: Chronic lung changes with no evidence of acute chest process. Mild cardiomegaly. Electronically Signed   By: Almira Bar M.D.   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 He presents for emergent angiogram and embolization. EXAM: IR EMBO ART VEN HEMORR LYMPH EXTRAV INC GUIDE ROADMAPPING; SELECTIVE VISCERAL ARTERIOGRAPHY; IR ULTRASOUND GUIDANCE VASC ACCESS RIGHT MEDICATIONS: None. ANESTHESIA/SEDATION: Moderate (conscious) sedation was employed during this procedure. A total of Versed 1 mg and Fentanyl 25 mcg was administered intravenously. Moderate Sedation Time: 83 minutes. The patient's level of consciousness and vital signs were monitored continuously by radiology nursing throughout the procedure under my direct supervision. CONTRAST:  70 mL Isovue 300 FLUOROSCOPY: Radiation Exposure Index (as provided by the fluoroscopic device): 1,099 mGy Kerma COMPLICATIONS: None immediate. PROCEDURE: Informed consent was obtained from the patient following explanation of the procedure, risks, benefits and alternatives. The patient understands, agrees and consents for the procedure. All questions were addressed. A time out was performed prior to the initiation of the procedure. Maximal barrier sterile technique utilized including caps, mask, sterile gowns, sterile gloves, large sterile drape, hand hygiene, and Betadine prep. The right common femoral artery was interrogated with ultrasound and found to be widely patent. An image was obtained and stored for the medical record. Local anesthesia was attained by infiltration with 1% lidocaine. A small dermatotomy was made. Under real-time sonographic guidance, the vessel was punctured with a 21 gauge  micropuncture needle. Using standard technique, the initial micro needle was exchanged over a 0.018 micro wire for a transitional 4 [place] micro sheath. The micro sheath was then exchanged over a 0.035 wire for a 5 French vascular sheath. A Sos Omni selective catheter was advanced over a Bentson wire and arteriography was performed. There is inadvertent catheterization of the right inferior phrenic artery with arteriogram. No evidence of abnormality. The celiac axis was catheterized and arteriography was performed. Unfortunately, the vessel is very robust and extremely down sloping. The Sos catheter points at a prominent left gastric artery. Purchase is not particularly strong. A longer reverse curve catheter is necessary. Therefore, the Sos catheter was exchanged for a Simmons catheter which was advanced over the aortic bifurcation. The Simmons catheter was used to catheterize the common hepatic artery. A hepatic arteriogram is performed. Hypertrophic and highly tortuous hepatic arteries. Large pseudoaneurysm arising from the segment 3 left hepatic artery measures grossly 5.3 x 3.3 cm. There is intravasation into an drainage through the left hepatic vein. A Cook can tot a microcatheter was advanced over a Fathom wire and used to select the left hepatic artery. Arteriography was performed. The segment 2 hepatic artery is visualized. The segment 3 hepatic artery bifurcates into a superior and inferior division. The pseudoaneurysm arises from the inferior division of the segment 3 artery. The microcatheter was further advanced into the inferior division. Arteriography again was again performed confirming the location of the arterial injury and pseudoaneurysm. The vessel measures approximately 2.7 mm. First, embolization was attempted using a Medtronic microvascular plug. Unfortunately, the plug could not be advanced due to the high tortuosity of the vessels without backing the catheter out. Therefore, the micro  vascular plug was discarded. An initial attempt was made to embolize using a 4 x 15 mm Ruby low-profile detachable coil. However, the coil would not form within the artery without  prolapsing into the large pseudoaneurysm sac. Therefore, therefore, this coil was recovered and set aside on the back table. Utilizing an exchange length 0.014 wire, the cantata microcatheter was exchanged for a lantern microcatheter. A 3 mm pod detachable coil was successfully deployed in the inferior division of the segment 3 artery forming the initial coil pack. Subsequent coil embolization was performed utilizing the 4 x 15 mm Ruby low-profile detachable coil, a 30 cm low-profile packing coil and a 30 cm standard packing coil. This resulted in complete embolization of the inferior and superior divisions of the segment 3 hepatic artery. Follow-up arteriography demonstrates no further opacification of the pseudoaneurysm. The catheters were removed. Hemostasis was attained with the assistance of a Celt arterial closure device. IMPRESSION: 1. Large arterial pseudoaneurysm arising from the inferior division of the segment 3 hepatic artery. There is associated intravasation through the hepatic parenchyma resulting in arteriovenous shunting into the left hepatic vein. 2. Successful coil embolization of the segment 3 hepatic artery with no further flow into the pseudoaneurysm. Electronically Signed   By: Malachy Moan M.D.   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 He presents for emergent angiogram and embolization. EXAM: IR EMBO ART VEN HEMORR LYMPH EXTRAV INC GUIDE ROADMAPPING; SELECTIVE VISCERAL ARTERIOGRAPHY; IR ULTRASOUND GUIDANCE VASC ACCESS RIGHT MEDICATIONS: None. ANESTHESIA/SEDATION: Moderate (conscious) sedation was employed during this procedure. A total of Versed 1 mg and  Fentanyl 25 mcg was administered intravenously. Moderate Sedation Time: 83 minutes. The patient's level of consciousness and vital signs were monitored continuously by radiology nursing throughout the procedure under my direct supervision. CONTRAST:  70 mL Isovue 300 FLUOROSCOPY: Radiation Exposure Index (as provided by the fluoroscopic device): 1,099 mGy Kerma COMPLICATIONS: None immediate. PROCEDURE: Informed consent was obtained from the patient following explanation of the procedure, risks, benefits and alternatives. The patient understands, agrees and consents for the procedure. All questions were addressed. A time out was performed prior to the initiation of the procedure. Maximal barrier sterile technique utilized including caps, mask, sterile gowns, sterile gloves, large sterile drape, hand hygiene, and Betadine prep. The right common femoral artery was interrogated with ultrasound and found to be widely patent. An image was obtained and stored for the medical record. Local anesthesia was attained by infiltration with 1% lidocaine. A small dermatotomy was made. Under real-time sonographic guidance, the vessel was punctured with a 21 gauge micropuncture needle. Using standard technique, the initial micro needle was exchanged over a 0.018 micro wire for a transitional 4 [place] micro sheath. The micro sheath was then exchanged over a 0.035 wire for a 5 French vascular sheath. A Sos Omni selective catheter was advanced over a Bentson wire and arteriography was performed. There is inadvertent catheterization of the right inferior phrenic artery with arteriogram. No evidence of abnormality. The celiac axis was catheterized and arteriography was performed. Unfortunately, the vessel is very robust and extremely down sloping. The Sos catheter points at a prominent left gastric artery. Purchase is not particularly strong. A longer reverse curve catheter is necessary. Therefore, the Sos catheter was exchanged for a  Simmons catheter which was advanced over the aortic bifurcation. The Simmons catheter was used to catheterize the common hepatic artery. A hepatic arteriogram is performed. Hypertrophic and highly tortuous hepatic arteries. Large pseudoaneurysm arising from the segment 3 left hepatic artery measures grossly 5.3 x 3.3 cm. There is intravasation into an drainage  through the left hepatic vein. A Cook can tot a microcatheter was advanced over a Fathom wire and used to select the left hepatic artery. Arteriography was performed. The segment 2 hepatic artery is visualized. The segment 3 hepatic artery bifurcates into a superior and inferior division. The pseudoaneurysm arises from the inferior division of the segment 3 artery. The microcatheter was further advanced into the inferior division. Arteriography again was again performed confirming the location of the arterial injury and pseudoaneurysm. The vessel measures approximately 2.7 mm. First, embolization was attempted using a Medtronic microvascular plug. Unfortunately, the plug could not be advanced due to the high tortuosity of the vessels without backing the catheter out. Therefore, the micro vascular plug was discarded. An initial attempt was made to embolize using a 4 x 15 mm Ruby low-profile detachable coil. However, the coil would not form within the artery without prolapsing into the large pseudoaneurysm sac. Therefore, therefore, this coil was recovered and set aside on the back table. Utilizing an exchange length 0.014 wire, the cantata microcatheter was exchanged for a lantern microcatheter. A 3 mm pod detachable coil was successfully deployed in the inferior division of the segment 3 artery forming the initial coil pack. Subsequent coil embolization was performed utilizing the 4 x 15 mm Ruby low-profile detachable coil, a 30 cm low-profile packing coil and a 30 cm standard packing coil. This resulted in complete embolization of the inferior and superior  divisions of the segment 3 hepatic artery. Follow-up arteriography demonstrates no further opacification of the pseudoaneurysm. The catheters were removed. Hemostasis was attained with the assistance of a Celt arterial closure device. IMPRESSION: 1. Large arterial pseudoaneurysm arising from the inferior division of the segment 3 hepatic artery. There is associated intravasation through the hepatic parenchyma resulting in arteriovenous shunting into the left hepatic vein. 2. Successful coil embolization of the segment 3 hepatic artery with no further flow into the pseudoaneurysm. Electronically Signed   By: Malachy Moan M.D.   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 He presents for emergent angiogram and embolization. EXAM: IR EMBO ART VEN HEMORR LYMPH EXTRAV INC GUIDE ROADMAPPING; SELECTIVE VISCERAL ARTERIOGRAPHY; IR ULTRASOUND GUIDANCE VASC ACCESS RIGHT MEDICATIONS: None. ANESTHESIA/SEDATION: Moderate (conscious) sedation was employed during this procedure. A total of Versed 1 mg and Fentanyl 25 mcg was administered intravenously. Moderate Sedation Time: 83 minutes. The patient's level of consciousness and vital signs were monitored continuously by radiology nursing throughout the procedure under my direct supervision. CONTRAST:  70 mL Isovue 300 FLUOROSCOPY: Radiation Exposure Index (as provided by the fluoroscopic device): 1,099 mGy Kerma COMPLICATIONS: None immediate. PROCEDURE: Informed consent was obtained from the patient following explanation of the procedure, risks, benefits and alternatives. The patient understands, agrees and consents for the procedure. All questions were addressed. A time out was performed prior to the initiation of the procedure. Maximal barrier sterile technique utilized including caps, mask, sterile gowns, sterile gloves,  large sterile drape, hand hygiene, and Betadine prep. The right common femoral artery was interrogated with ultrasound and found to be widely patent. An image was obtained and stored for the medical record. Local anesthesia was attained by infiltration with 1% lidocaine. A small dermatotomy was made. Under real-time sonographic guidance, the vessel was punctured with a 21 gauge micropuncture needle. Using standard technique, the initial micro needle was exchanged over a 0.018 micro wire for a transitional 4 [place] micro sheath.  The micro sheath was then exchanged over a 0.035 wire for a 5 French vascular sheath. A Sos Omni selective catheter was advanced over a Bentson wire and arteriography was performed. There is inadvertent catheterization of the right inferior phrenic artery with arteriogram. No evidence of abnormality. The celiac axis was catheterized and arteriography was performed. Unfortunately, the vessel is very robust and extremely down sloping. The Sos catheter points at a prominent left gastric artery. Purchase is not particularly strong. A longer reverse curve catheter is necessary. Therefore, the Sos catheter was exchanged for a Simmons catheter which was advanced over the aortic bifurcation. The Simmons catheter was used to catheterize the common hepatic artery. A hepatic arteriogram is performed. Hypertrophic and highly tortuous hepatic arteries. Large pseudoaneurysm arising from the segment 3 left hepatic artery measures grossly 5.3 x 3.3 cm. There is intravasation into an drainage through the left hepatic vein. A Cook can tot a microcatheter was advanced over a Fathom wire and used to select the left hepatic artery. Arteriography was performed. The segment 2 hepatic artery is visualized. The segment 3 hepatic artery bifurcates into a superior and inferior division. The pseudoaneurysm arises from the inferior division of the segment 3 artery. The microcatheter was further advanced into the inferior  division. Arteriography again was again performed confirming the location of the arterial injury and pseudoaneurysm. The vessel measures approximately 2.7 mm. First, embolization was attempted using a Medtronic microvascular plug. Unfortunately, the plug could not be advanced due to the high tortuosity of the vessels without backing the catheter out. Therefore, the micro vascular plug was discarded. An initial attempt was made to embolize using a 4 x 15 mm Ruby low-profile detachable coil. However, the coil would not form within the artery without prolapsing into the large pseudoaneurysm sac. Therefore, therefore, this coil was recovered and set aside on the back table. Utilizing an exchange length 0.014 wire, the cantata microcatheter was exchanged for a lantern microcatheter. A 3 mm pod detachable coil was successfully deployed in the inferior division of the segment 3 artery forming the initial coil pack. Subsequent coil embolization was performed utilizing the 4 x 15 mm Ruby low-profile detachable coil, a 30 cm low-profile packing coil and a 30 cm standard packing coil. This resulted in complete embolization of the inferior and superior divisions of the segment 3 hepatic artery. Follow-up arteriography demonstrates no further opacification of the pseudoaneurysm. The catheters were removed. Hemostasis was attained with the assistance of a Celt arterial closure device. IMPRESSION: 1. Large arterial pseudoaneurysm arising from the inferior division of the segment 3 hepatic artery. There is associated intravasation through the hepatic parenchyma resulting in arteriovenous shunting into the left hepatic vein. 2. Successful coil embolization of the segment 3 hepatic artery with no further flow into the pseudoaneurysm. Electronically Signed   By: Malachy Moan M.D.   On: 02/09/2023 21:32   IR Angiogram Visceral Selective  Result Date: 02/09/2023 INDICATION: 47 year old male with malignant hypertension and  spontaneous intrahepatic arterial pseudoaneurysm. He presents for emergent angiogram and embolization. EXAM: IR EMBO ART VEN HEMORR LYMPH EXTRAV INC GUIDE ROADMAPPING; SELECTIVE VISCERAL ARTERIOGRAPHY; IR ULTRASOUND GUIDANCE VASC ACCESS RIGHT MEDICATIONS: None. ANESTHESIA/SEDATION: Moderate (conscious) sedation was employed during this procedure. A total of Versed 1 mg and Fentanyl 25 mcg was administered intravenously. Moderate Sedation Time: 83 minutes. The patient's level of consciousness and vital signs were monitored continuously by radiology nursing throughout the procedure under my direct supervision. CONTRAST:  70 mL Isovue 300 FLUOROSCOPY: Radiation Exposure  Index (as provided by the fluoroscopic device): 1,099 mGy Kerma COMPLICATIONS: None immediate. PROCEDURE: Informed consent was obtained from the patient following explanation of the procedure, risks, benefits and alternatives. The patient understands, agrees and consents for the procedure. All questions were addressed. A time out was performed prior to the initiation of the procedure. Maximal barrier sterile technique utilized including caps, mask, sterile gowns, sterile gloves, large sterile drape, hand hygiene, and Betadine prep. The right common femoral artery was interrogated with ultrasound and found to be widely patent. An image was obtained and stored for the medical record. Local anesthesia was attained by infiltration with 1% lidocaine. A small dermatotomy was made. Under real-time sonographic guidance, the vessel was punctured with a 21 gauge micropuncture needle. Using standard technique, the initial micro needle was exchanged over a 0.018 micro wire for a transitional 4 [place] micro sheath. The micro sheath was then exchanged over a 0.035 wire for a 5 French vascular sheath. A Sos Omni selective catheter was advanced over a Bentson wire and arteriography was performed. There is inadvertent catheterization of the right inferior phrenic artery  with arteriogram. No evidence of abnormality. The celiac axis was catheterized and arteriography was performed. Unfortunately, the vessel is very robust and extremely down sloping. The Sos catheter points at a prominent left gastric artery. Purchase is not particularly strong. A longer reverse curve catheter is necessary. Therefore, the Sos catheter was exchanged for a Simmons catheter which was advanced over the aortic bifurcation. The Simmons catheter was used to catheterize the common hepatic artery. A hepatic arteriogram is performed. Hypertrophic and highly tortuous hepatic arteries. Large pseudoaneurysm arising from the segment 3 left hepatic artery measures grossly 5.3 x 3.3 cm. There is intravasation into an drainage through the left hepatic vein. A Cook can tot a microcatheter was advanced over a Fathom wire and used to select the left hepatic artery. Arteriography was performed. The segment 2 hepatic artery is visualized. The segment 3 hepatic artery bifurcates into a superior and inferior division. The pseudoaneurysm arises from the inferior division of the segment 3 artery. The microcatheter was further advanced into the inferior division. Arteriography again was again performed confirming the location of the arterial injury and pseudoaneurysm. The vessel measures approximately 2.7 mm. First, embolization was attempted using a Medtronic microvascular plug. Unfortunately, the plug could not be advanced due to the high tortuosity of the vessels without backing the catheter out. Therefore, the micro vascular plug was discarded. An initial attempt was made to embolize using a 4 x 15 mm Ruby low-profile detachable coil. However, the coil would not form within the artery without prolapsing into the large pseudoaneurysm sac. Therefore, therefore, this coil was recovered and set aside on the back table. Utilizing an exchange length 0.014 wire, the cantata microcatheter was exchanged for a lantern microcatheter.  A 3 mm pod detachable coil was successfully deployed in the inferior division of the segment 3 artery forming the initial coil pack. Subsequent coil embolization was performed utilizing the 4 x 15 mm Ruby low-profile detachable coil, a 30 cm low-profile packing coil and a 30 cm standard packing coil. This resulted in complete embolization of the inferior and superior divisions of the segment 3 hepatic artery. Follow-up arteriography demonstrates no further opacification of the pseudoaneurysm. The catheters were removed. Hemostasis was attained with the assistance of a Celt arterial closure device. IMPRESSION: 1. Large arterial pseudoaneurysm arising from the inferior division of the segment 3 hepatic artery. There is associated intravasation through the  hepatic parenchyma resulting in arteriovenous shunting into the left hepatic vein. 2. Successful coil embolization of the segment 3 hepatic artery with no further flow into the pseudoaneurysm. Electronically Signed   By: Malachy Moan M.D.   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 He presents for emergent angiogram and embolization. EXAM: IR EMBO ART VEN HEMORR LYMPH EXTRAV INC GUIDE ROADMAPPING; SELECTIVE VISCERAL ARTERIOGRAPHY; IR ULTRASOUND GUIDANCE VASC ACCESS RIGHT MEDICATIONS: None. ANESTHESIA/SEDATION: Moderate (conscious) sedation was employed during this procedure. A total of Versed 1 mg and Fentanyl 25 mcg was administered intravenously. Moderate Sedation Time: 83 minutes. The patient's level of consciousness and vital signs were monitored continuously by radiology nursing throughout the procedure under my direct supervision. CONTRAST:  70 mL Isovue 300 FLUOROSCOPY: Radiation Exposure Index (as provided by the fluoroscopic device): 1,099 mGy Kerma COMPLICATIONS: None immediate. PROCEDURE: Informed  consent was obtained from the patient following explanation of the procedure, risks, benefits and alternatives. The patient understands, agrees and consents for the procedure. All questions were addressed. A time out was performed prior to the initiation of the procedure. Maximal barrier sterile technique utilized including caps, mask, sterile gowns, sterile gloves, large sterile drape, hand hygiene, and Betadine prep. The right common femoral artery was interrogated with ultrasound and found to be widely patent. An image was obtained and stored for the medical record. Local anesthesia was attained by infiltration with 1% lidocaine. A small dermatotomy was made. Under real-time sonographic guidance, the vessel was punctured with a 21 gauge micropuncture needle. Using standard technique, the initial micro needle was exchanged over a 0.018 micro wire for a transitional 4 [place] micro sheath. The micro sheath was then exchanged over a 0.035 wire for a 5 French vascular sheath. A Sos Omni selective catheter was advanced over a Bentson wire and arteriography was performed. There is inadvertent catheterization of the right inferior phrenic artery with arteriogram. No evidence of abnormality. The celiac axis was catheterized and arteriography was performed. Unfortunately, the vessel is very robust and extremely down sloping. The Sos catheter points at a prominent left gastric artery. Purchase is not particularly strong. A longer reverse curve catheter is necessary. Therefore, the Sos catheter was exchanged for a Simmons catheter which was advanced over the aortic bifurcation. The Simmons catheter was used to catheterize the common hepatic artery. A hepatic arteriogram is performed. Hypertrophic and highly tortuous hepatic arteries. Large pseudoaneurysm arising from the segment 3 left hepatic artery measures grossly 5.3 x 3.3 cm. There is intravasation into an drainage through the left hepatic vein. A Cook can tot a  microcatheter was advanced over a Fathom wire and used to select the left hepatic artery. Arteriography was performed. The segment 2 hepatic artery is visualized. The segment 3 hepatic artery bifurcates into a superior and inferior division. The pseudoaneurysm arises from the inferior division of the segment 3 artery. The microcatheter was further advanced into the inferior division. Arteriography again was again performed confirming the location of the arterial injury and pseudoaneurysm. The vessel measures approximately 2.7 mm. First, embolization was attempted using a Medtronic microvascular plug. Unfortunately, the plug could not be advanced due to the high tortuosity of the vessels without backing the catheter out. Therefore, the micro vascular plug was discarded. An initial attempt was made to embolize using a 4 x 15 mm Ruby low-profile detachable coil. However, the coil would not form within the artery without  prolapsing into the large pseudoaneurysm sac. Therefore, therefore, this coil was recovered and set aside on the back table. Utilizing an exchange length 0.014 wire, the cantata microcatheter was exchanged for a lantern microcatheter. A 3 mm pod detachable coil was successfully deployed in the inferior division of the segment 3 artery forming the initial coil pack. Subsequent coil embolization was performed utilizing the 4 x 15 mm Ruby low-profile detachable coil, a 30 cm low-profile packing coil and a 30 cm standard packing coil. This resulted in complete embolization of the inferior and superior divisions of the segment 3 hepatic artery. Follow-up arteriography demonstrates no further opacification of the pseudoaneurysm. The catheters were removed. Hemostasis was attained with the assistance of a Celt arterial closure device. IMPRESSION: 1. Large arterial pseudoaneurysm arising from the inferior division of the segment 3 hepatic artery. There is associated intravasation through the hepatic parenchyma  resulting in arteriovenous shunting into the left hepatic vein. 2. Successful coil embolization of the segment 3 hepatic artery with no further flow into the pseudoaneurysm. Electronically Signed   By: Malachy Moan M.D.   On: 02/09/2023 21:32   CT Angio Chest/Abd/Pel for Dissection W and/or Wo Contrast  Result Date: 02/09/2023 CLINICAL DATA:  Acute  aortic syndrome suspected. EXAM: CT ANGIOGRAPHY CHEST, ABDOMEN AND PELVIS TECHNIQUE: Non-contrast CT of the chest was initially obtained. Multidetector CT imaging through the chest, abdomen and pelvis was performed using the standard protocol during bolus administration of intravenous contrast. Multiplanar reconstructed images and MIPs were obtained and reviewed to evaluate the vascular anatomy. RADIATION DOSE REDUCTION: This exam was performed according to the departmental dose-optimization program which includes automated exposure control, adjustment of the mA and/or kV according to patient size and/or use of iterative reconstruction technique. CONTRAST:  75mL OMNIPAQUE IOHEXOL 350 MG/ML SOLN COMPARISON:  MRI abdomen 12/03/2021, noncontrast CT 12/05/2022, ultrasound 02/09/2023 FINDINGS: CTA CHEST FINDINGS Cardiovascular: Noncontrast series demonstrates no intramural hematoma thoracic aorta. Contrast series demonstrates no aortic dissection or aneurysm. Mediastinum/Nodes: No axillary or supraclavicular adenopathy. No mediastinal or hilar adenopathy. No pericardial fluid. Esophagus normal. Lungs/Pleura: No suspicious pulmonary nodules. Normal pleural. Airways normal. Musculoskeletal: No aggressive osseous lesion. Review of the MIP images confirms the above findings. CTA ABDOMEN AND PELVIS FINDINGS VASCULAR Aorta: Normal caliber aorta without aneurysm, dissection, vasculitis or significant stenosis. Celiac: Patent without evidence of aneurysm, dissection, vasculitis or significant stenosis. SMA: Patent without evidence of aneurysm, dissection, vasculitis or  significant stenosis. Renals: Both renal arteries are patent without evidence of aneurysm, dissection, vasculitis, fibromuscular dysplasia or significant stenosis. IMA: Patent without evidence of aneurysm, dissection, vasculitis or significant stenosis. Inflow: Patent without evidence of aneurysm, dissection, vasculitis or significant stenosis. Veins: No obvious venous abnormality within the limitations of this arterial phase study. Review of the MIP images confirms the above findings. NON-VASCULAR Hepatobiliary: There is an early enhancing vascular lesion within the lateral segment LEFT hepatic lobe (segment 2 and segment 3). Rounded vascular structure measuring 3.7 by 3.3 cm in total dimension (image 179/7). 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 No evidence of fistula on noncontrast MRI December 03, 2021 or noncontrast CT 12/05/2022. Pancreas: Pancreas is normal. No ductal dilatation. No pancreatic inflammation. Spleen: Normal spleen Adrenals/urinary tract: Adrenal glands and kidneys are normal. The ureters and bladder normal. Stomach/Bowel: Stomach, small bowel, appendix, and cecum are normal. The colon and rectosigmoid colon are normal. Vascular/Lymphatic: No lymphadenopathy Reproductive:  Prostate unremarkable Other: No free fluid. Musculoskeletal: No aggressive osseous lesion. Review of the MIP images confirms the above findings. IMPRESSION: CHEST IMPRESSION: 1. No evidence of aortic dissection or aneurysm. 2. No acute pulmonary parenchymal findings. PELVIS IMPRESSION: 1. No evidence of aortic dissection or aneurysm. 2. No acute findings in the abdomen pelvis. 3. 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 Recommend non emergent interventional radiology consultation. Dedicated ultrasound  evaluation the lateral segment LEFT hepatic lobe may be valuable. Lesion appears new from MRI 12/03/2021 (noncontrast). Electronically Signed   By: Genevive Bi M.D.   On: 02/09/2023 15:42   US Abdomen Limited RUQ (LIVER/GB)  Result Date: 02/09/2023 CLINICAL DATA:  Right upper quadrant discomfort EXAM: ULTRASOUND ABDOMEN LIMITED RIGHT UPPER QUADRANT COMPARISON:  None Available. FINDINGS: Gallbladder: Mild gallbladder wall thickening measuring 4.2 mm. Mild pericholecystic fluid. No stones, sludge, or Murphy's sign. Common bile duct: Diameter: 2 mm Liver: Diffuse increased echogenicity. No focal mass. Portal vein is patent on color Doppler imaging with normal direction of blood flow towards the liver. Other: Mild ascites in this dialysis patient. The right kidney is echogenic. IMPRESSION: 1. Mild gallbladder wall thickening. Mild pericholecystic fluid is nonspecific in a patient with ascites. No stones, sludge, or Murphy's sign. 2. Diffuse increased echogenicity in the liver is nonspecific but often due to hepatic steatosis. 3. Mild ascites in this dialysis patient. 4. The right kidney is echogenic consistent with medical renal disease. Electronically Signed   By: Gerome Sam III M.D.   On: 02/09/2023 12:10   DG Chest Portable 1 View  Result Date: 02/09/2023 CLINICAL DATA:  Chest pain EXAM: PORTABLE CHEST 1 VIEW COMPARISON:  Feb 04, 2023 FINDINGS: Stable cardiomegaly. Hila and mediastinum are normal. No pneumothorax. Mild bibasilar opacities. IMPRESSION: Mild bibasilar opacities could represent early infiltrate. Developing asymmetric edema is a possibility. Recommend clinical correlation. Electronically Signed   By: Gerome Sam III M.D.   On: 02/09/2023 10:57    Labs:  CBC: Recent Labs    02/10/23 0213 02/10/23 1122 02/11/23 0547 02/12/23 0743  WBC 11.0* 19.7* 28.4* 19.9*  HGB 10.9* 10.9* 10.0* 9.4*  HCT 33.2* 33.8* 29.3* 27.9*  PLT 203 183 147* 148*    COAGS: Recent Labs     02/09/23 1720 02/11/23 0850  INR 1.2 1.6*  APTT  --  40*    BMP: Recent Labs    02/09/23 0950 02/10/23 0213 02/11/23 0547 02/12/23 0743  NA 136 132* 128* 133*  K 4.6 5.2* 5.9* 4.9  CL 99 96* 94* 94*  CO2 24 21* 19* 25  GLUCOSE 130* 121* 88 86  BUN 37* 47* 71* 47*  CALCIUM 9.7 9.7 9.6 8.9  CREATININE 5.66* 7.09* 9.90* 6.88*  GFRNONAA 12* 9* 6* 9*    LIVER FUNCTION TESTS: Recent Labs    12/05/22 1523 02/09/23 1052 02/11/23 0547 02/12/23 0743  BILITOT 1.4* 1.3* 1.6* 1.5*  AST 23 46* 201* 163*  ALT 13 25 116* 129*  ALKPHOS 138* 172* 102 96  PROT 7.5 9.1* 7.7 7.3  ALBUMIN 2.7* 3.5 3.6 2.4*    Assessment and Plan:  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  Labs and vitals stable. Some issues with oxygen desaturation while sleeping but patient otherwise stable. IR recommends to continue trending labs, pain medication as needed. Further plans per primary teams.   Please call IR with any questions/concerns.  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

## 2023-02-12 NOTE — Progress Notes (Signed)
PROGRESS NOTE        PATIENT DETAILS Name: Roy Randolph Age: 47 y.o. Sex: male Date of Birth: 12-16-1975 Admit Date: 02/11/2023 Admitting Physician Jonah Blue, MD ZOX:[id number], Binnie Rail, MD  Brief Summary: Patient is a 47 y.o.  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  Significant events: 5/12-5/13>> hospitalization for spontaneous acute hepatic hematoma-s/p call embolization of pseudoaneurysm hepatic artery by IR-patient signed out AMA. 5/14>> back to the ED for severe abdominal pain-thought to have post embolization syndrome/hepatic necrosis-admit to [facility].  Significant studies: 5/14>> CT angio chest/abdomen/pelvis: Recent: Ligation of pseudoaneurysm in the left lobe of liver without definite residual perforation of pseudoaneurysm, small-moderate hemorrhagic ascites.   Significant microbiology data: 5/14>> blood culture: No growth 5/14>> COVID PCR: Negative  Procedures: None  Consults: IR CCS  Subjective: Continues to have abdominal pain-but appears comfortable.  Objective: Vitals: Blood pressure 125/79, pulse 74, temperature 98 F (36.7 C), resp. rate (!) 23, height 5\' 9"  (1.753 m), weight 64.3 kg, SpO2 96 %.   Exam: Gen Exam:Alert awake-not in any distress HEENT:atraumatic, normocephalic Chest: B/L clear to auscultation anteriorly CVS:S1S2 regular Abdomen: Soft-diffusely tender Extremities:no edema Neurology: Non focal Skin: no rash  Pertinent Labs/Radiology:    Latest Ref Rng & Units 02/12/2023    7:43 AM 02/11/2023    5:47 AM 02/10/2023    11:22 AM  CBC  WBC 4.0 - 10.5 K/uL 19.9  28.4  19.7   Hemoglobin 13.0 - 17.0 g/dL 9.4  09.8  11.9   Hematocrit 39.0 - 52.0 % 27.9  29.3  33.8   Platelets 150 - 400 K/uL 148  147  183     Lab Results  Component Value Date   NA 133 (L) 02/12/2023   K 4.9 02/12/2023   CL 94 (L) 02/12/2023   CO2 25 02/12/2023      Assessment/Plan: Abdominal pain-likely due to post embolization syndrome/possibly subsegmental liver necrosis Supportive care Empiric Levaquin/Flagyl IR/CCS following-await further recommendations  OSA Acknowledges being diagnosed with OSA-but never "picked up" CPAP Requiring up to 6 L of oxygen mostly nocturnally CPAP nightly while hospitalized  ESRD on HD 4x/week Noncompliant-missed HD several days this week per nephrology Nephrology following and directing care  Normocytic anemia Secondary to ESRD Aranesp/iron per nephrology  HTN BP stable Continue zotepine/clonidine/labetalol Per prior notes-poor compliance-prior history of PRES  Tobacco/alcohol use Counseled Watch for withdrawal symptoms  Pleural nodularity posterior aspect of right hemithorax Radiology recommending follow-up contrast-enhanced CT in 2 months.  BMI: Estimated body mass index is 20.93 kg/m as calculated from the following:   Height as of this encounter: 5\' 9"  (1.753 m).   Weight as of this encounter: 64.3 kg.   Code status:   Code Status: Full Code   DVT Prophylaxis: SCDs Start: 02/11/23 1635   Family Communication: None at bedside   Disposition Plan: Status is: Inpatient Remains inpatient appropriate because: Severity of illness   Planned Discharge Destination:Home   Diet: Diet Order             Diet clear liquid Room service appropriate? No; Fluid  consistency: Thin  Diet effective now                     Antimicrobial agents: Anti-infectives (From admission, onward)    Start     Dose/Rate Route Frequency Ordered Stop   02/12/23 1000  levofloxacin  (LEVAQUIN) IVPB 500 mg        500 mg 100 mL/hr over 60 Minutes Intravenous Every 48 hours 02/11/23 1637     02/11/23 1715  metroNIDAZOLE (FLAGYL) IVPB 500 mg        500 mg 100 mL/hr over 60 Minutes Intravenous Every 12 hours 02/11/23 1626     02/11/23 1645  levofloxacin (LEVAQUIN) IVPB 750 mg        750 mg 100 mL/hr over 90 Minutes Intravenous STAT 02/11/23 1626 02/12/23 0840   02/11/23 0645  metroNIDAZOLE (FLAGYL) IVPB 500 mg        500 mg 100 mL/hr over 60 Minutes Intravenous  Once 02/11/23 0633 02/11/23 0749   02/11/23 0645  vancomycin (VANCOCIN) IVPB 1000 mg/200 mL premix        1,000 mg 200 mL/hr over 60 Minutes Intravenous  Once 02/11/23 0634 02/11/23 0833   02/11/23 0645  ceFEPIme (MAXIPIME) 2 g in sodium chloride 0.9 % 100 mL IVPB        2 g 200 mL/hr over 30 Minutes Intravenous  Once 02/11/23 0634 02/11/23 0727        MEDICATIONS: Scheduled Meds:  amLODipine  10 mg Oral Daily   Chlorhexidine Gluconate Cloth  6 each Topical Q0600   cloNIDine  0.3 mg Oral TID   labetalol  400 mg Oral BID   multivitamin  1 tablet Oral Daily   pantoprazole  40 mg Oral Daily   sevelamer carbonate  800 mg Oral TID with meals   Continuous Infusions:  levofloxacin (LEVAQUIN) IV     metronidazole 500 mg (02/12/23 0846)   PRN Meds:.acetaminophen **OR** acetaminophen, bisacodyl, hydrALAZINE, HYDROmorphone (DILAUDID) injection, hydrOXYzine, ondansetron **OR** ondansetron (ZOFRAN) IV   I have personally reviewed following labs and imaging studies  LABORATORY DATA: CBC: Recent Labs  Lab 02/09/23 1720 02/10/23 0213 02/10/23 1122 02/11/23 0547 02/12/23 0743  WBC 7.9 11.0* 19.7* 28.4* 19.9*  NEUTROABS  --   --   --  23.1* 17.9*  HGB 10.4* 10.9* 10.9* 10.0* 9.4*  HCT 33.2* 33.2* 33.8* 29.3* 27.9*  MCV 92.2 90.0 91.4 88.0 86.4  PLT 161 203 183 147* 148*    Basic Metabolic Panel: Recent Labs  Lab 02/09/23 0950 02/10/23 0213 02/11/23 0547 02/12/23 0743  NA 136 132* 128* 133*  K  4.6 5.2* 5.9* 4.9  CL 99 96* 94* 94*  CO2 24 21* 19* 25  GLUCOSE 130* 121* 88 86  BUN 37* 47* 71* 47*  CREATININE 5.66* 7.09* 9.90* 6.88*  CALCIUM 9.7 9.7 9.6 8.9  MG  --  2.2  --  1.8  PHOS  --  7.2*  --   --     GFR: Estimated Creatinine Clearance: 12.2 mL/min (A) (by C-G formula based on SCr of 6.88 mg/dL (H)).  Liver Function Tests: Recent Labs  Lab 02/09/23 1052 02/11/23 0547 02/12/23 0743  AST 46* 201* 163*  ALT 25 116* 129*  ALKPHOS 172* 102 96  BILITOT 1.3* 1.6* 1.5*  PROT 9.1* 7.7 7.3  ALBUMIN 3.5 3.6 2.4*   Recent Labs  Lab 02/09/23 1052  LIPASE 32   No results for input(s): "AMMONIA" in the last 168 hours.  Coagulation Profile: Recent Labs  Lab 02/09/23 1720 02/11/23 0850  INR 1.2 1.6*    Cardiac Enzymes: No results for input(s): "CKTOTAL", "CKMB", "CKMBINDEX", "TROPONINI" in the last 168 hours.  BNP (last 3 results) No results for input(s): "PROBNP" in the last 8760 hours.  Lipid Profile: No results for input(s): "CHOL", "HDL", "LDLCALC", "TRIG", "CHOLHDL", "LDLDIRECT" in the last 72 hours.  Thyroid Function Tests: No results for input(s): "TSH", "T4TOTAL", "FREET4", "T3FREE", "THYROIDAB" in the last 72 hours.  Anemia Panel: No results for input(s): "VITAMINB12", "FOLATE", "FERRITIN", "TIBC", "IRON", "RETICCTPCT" in the last 72 hours.  Urine analysis:    Component Value Date/Time   COLORURINE YELLOW 02/11/2023 0650   APPEARANCEUR HAZY (A) 02/11/2023 0650   APPEARANCEUR Clear 12/26/2022 1022   LABSPEC 1.033 (H) 02/11/2023 0650   PHURINE 5.5 02/11/2023 0650   GLUCOSEU NEGATIVE 02/11/2023 0650   HGBUR TRACE (A) 02/11/2023 0650   BILIRUBINUR NEGATIVE 02/11/2023 0650   BILIRUBINUR Negative 12/26/2022 1022   KETONESUR NEGATIVE 02/11/2023 0650   PROTEINUR 100 (A) 02/11/2023 0650   NITRITE NEGATIVE 02/11/2023 0650   LEUKOCYTESUR NEGATIVE 02/11/2023 0650    Sepsis Labs: Lactic Acid, Venous    Component Value Date/Time   LATICACIDVEN  1.3 02/11/2023 0850    MICROBIOLOGY: Recent Results (from the past 240 hour(s))  MRSA Next Gen by PCR, Nasal     Status: Abnormal   Collection Time: 02/09/23  6:31 PM   Specimen: Nasal Mucosa; Nasal Swab  Result Value Ref Range Status   MRSA by PCR Next Gen DETECTED (A) NOT DETECTED Final    Comment: (NOTE) The GeneXpert MRSA Assay (FDA approved for NASAL specimens only), is one component of a comprehensive MRSA colonization surveillance program. It is not intended to diagnose MRSA infection nor to guide or monitor treatment for MRSA infections. Test performance is not FDA approved in patients less than 79 years old. Performed at [facility], [address], [place], [place] [address]   Culture, blood (routine x 2)     Status: None (Preliminary result)   Collection Time: 02/11/23  5:35 AM   Specimen: BLOOD  Result Value Ref Range Status   Specimen Description   Final    BLOOD BLOOD RIGHT FOREARM Performed at [facility], [address], [name], [place] [address]    Special Requests   Final    BOTTLES DRAWN AEROBIC AND ANAEROBIC Blood Culture adequate volume Performed at [facility], [address] [place], [place] [address]    Culture   Final    NO GROWTH < 24 HOURS Performed at [facility], [address], [place], [place] [address]    Report Status PENDING  Incomplete  Culture, blood (routine x 2)     Status: None (Preliminary result)   Collection Time: 02/11/23  5:47 AM   Specimen: BLOOD  Result Value Ref Range Status   Specimen Description   Final    BLOOD RIGHT UPPER ARM Performed at [facility], [address] [place], [place] [address]    Special Requests   Final    BOTTLES DRAWN AEROBIC AND ANAEROBIC Blood Culture adequate volume Performed at [facility], [address], [place], [place] [address]    Culture   Final    NO GROWTH < 24 HOURS Performed at [facility], [address], [place], [place] [address]    Report Status PENDING  Incomplete  SARS Coronavirus 2 by RT PCR (hospital  order, performed in [facility] hospital lab) *cepheid single result test* Anterior Nasal Swab     Status: None   Collection Time: 02/11/23 10:18 AM   Specimen: Anterior Nasal Swab  Result Value Ref Range Status   SARS Coronavirus 2 by RT PCR NEGATIVE NEGATIVE Final    Comment: (NOTE) SARS-CoV-2 target nucleic acids are NOT DETECTED.  The SARS-CoV-2 RNA is generally detectable in upper and lower respiratory specimens during the acute phase of infection. The lowest concentration of SARS-CoV-2 viral copies this assay can detect is 250 copies / mL. A negative result does not preclude SARS-CoV-2 infection and should not be used as the sole basis for treatment or other patient management decisions.  A negative result may occur with improper specimen collection / handling, submission of specimen other than nasopharyngeal swab, presence of viral mutation(s) within the areas targeted by this assay, and inadequate number of viral copies (<250 copies / mL). A negative result must be combined with clinical observations, patient history, and epidemiological information.  Fact Sheet for Patients:   RoadLapTop.co.za  Fact Sheet for Healthcare Providers: http://kim-miller.com/  This test is not yet approved or  cleared by the [place] FDA and has been authorized for detection and/or diagnosis of SARS-CoV-2 by FDA under an Emergency Use Authorization (EUA).  This EUA will remain in effect (meaning this test can be used) for the duration of the COVID-19 declaration under Section 564(b)(1) of the Act, 21 U.S.C. section 360bbb-3(b)(1), unless the authorization is terminated or revoked sooner.  Performed at [facility], [address], [place], [place] [address]     RADIOLOGY  STUDIES/RESULTS: CT Angio Chest/Abd/Pel for Dissection W and/or Wo Contrast  Result Date: 02/11/2023 CLINICAL DATA:  47 year old male with history of worsening pain, fever and hypoxia. Recent history of hepatic arteriovenous malformation coil procedure. EXAM: CT ANGIOGRAPHY CHEST, ABDOMEN AND PELVIS TECHNIQUE: Non-contrast CT of the chest was initially obtained. Multidetector CT imaging through the chest, abdomen and pelvis was performed using the standard protocol during bolus administration of intravenous contrast. Multiplanar reconstructed images and MIPs were obtained and reviewed to evaluate the vascular anatomy. RADIATION DOSE REDUCTION: This exam was performed according to the departmental dose-optimization program which includes automated exposure control, adjustment of the mA and/or kV according to patient size and/or use of iterative reconstruction technique. CONTRAST:  60mL OMNIPAQUE IOHEXOL 350 MG/ML SOLN COMPARISON:  CTA of the chest, abdomen and pelvis 02/09/2023. FINDINGS: CTA CHEST FINDINGS Cardiovascular: Heart size is mildly enlarged. There is no significant pericardial fluid, thickening or pericardial calcification. No acute abnormality of the thoracic aorta or the great vessels of the mediastinum. Ascending thoracic aorta, mid aortic arch and descending thoracic aorta measure 3.2 cm, 3.0 cm and 2.3 cm in diameter respectively. No evidence of thoracic aortic dissection. Mediastinum/Nodes: Multiple prominent borderline enlarged mediastinal and bilateral hilar lymph nodes measuring up to 1.4 cm in short axis in the subcarinal nodal station, nonspecific, but similar to the prior study, and favored to be benign. Esophagus is unremarkable in appearance. No axillary lymphadenopathy. Lungs/Pleura: Dependent opacities are noted in the lungs bilaterally (left-greater-than-right), which appear predominantly atelectatic, although less well-defined areas of ground-glass attenuation and airspace  consolidation are noted in the extreme lung bases, which may suggest areas of infectious consolidation and inflammation. No pleural effusions. Pleural nodularity is noted in the right hemithorax, best appreciated on axial images 48 and 49 of series 4, where the largest pleural nodule measures 1.7 x 1.6 cm (40 HU). No left-sided  pleural nodularity is noted. Mild diffuse bronchial wall thickening with mild centrilobular and paraseptal emphysema. No definite suspicious appearing pulmonary nodules or masses are noted. Musculoskeletal: There are no aggressive appearing lytic or blastic lesions noted in the visualized portions of the skeleton. Review of the MIP images confirms the above findings. CTA ABDOMEN AND PELVIS FINDINGS VASCULAR Aorta: Aortic atherosclerosis. Normal caliber aorta without aneurysm, dissection, vasculitis or significant stenosis. Celiac: Embolization coils are noted in the region of the of the left hepatic artery. Celiac axis and branches are otherwise without evidence of aneurysm, dissection, vasculitis or significant stenosis. SMA: Patent without evidence of aneurysm, dissection, vasculitis or significant stenosis. Renals: Both renal arteries are patent without evidence of aneurysm, dissection, vasculitis, fibromuscular dysplasia or significant stenosis. IMA: Patent without evidence of aneurysm, dissection, vasculitis or significant stenosis. Inflow: Patent without evidence of significant stenosis. Mild aneurysmal dilatation of the right common iliac artery (1.7 cm in diameter). Short segment dissection of the distal left common iliac artery which propagates into the left external and internal iliac arteries over a very short distance, and is associated with mild aneurysmal dilatation of the left internal iliac artery (axial image 231 of series 4) measuring 1.1 cm in diameter, similar to the prior study. Veins: No obvious venous abnormality within the limitations of this arterial phase study.  Review of the MIP images confirms the above findings. NON-VASCULAR Hepatobiliary: Mild enlargement and heterogeneous attenuation in the left lobe of the liver predominantly in segment 3 related to prior intrahepatic hemorrhage and subsequent embolization. Perfusion throughout this region appears slightly diminished, likely secondary to embolization of branches of the left hepatic artery, without definitive evidence of persistent perfusion of the previously embolized pseudoaneurysm. No well organized fluid collection is confidently identified at this time. Liver is otherwise diffusely enlarged, but no focal hepatic lesions are noted. No intra or extrahepatic biliary ductal dilatation. High attenuation material within the lumen of the gallbladder is compatible with vicarious excretion of contrast material related to prior contrast-enhanced CT examinations and embolization procedure. Pancreas: No pancreatic mass. No pancreatic ductal dilatation. No pancreatic or peripancreatic fluid collections or inflammatory changes. Spleen: Unremarkable. Adrenals/Urinary Tract: Bilateral kidneys and adrenal glands are normal in appearance. No hydroureteronephrosis. Urinary bladder is nearly completely decompressed and otherwise unremarkable in appearance. Stomach/Bowel: The appearance of the stomach is normal. There is no pathologic dilatation of small bowel or colon. Normal appendix. Lymphatic: No lymphadenopathy noted in the abdomen or pelvis. Reproductive: Prostate gland is unremarkable in appearance. Other: Small to moderate volume of ascites, most evident adjacent to the liver and in the low anatomic pelvis. This is intermediate to high attenuation, indicative of internal blood products. Musculoskeletal: There are no aggressive appearing lytic or blastic lesions noted in the visualized portions of the skeleton. Review of the MIP images confirms the above findings. IMPRESSION: 1. Postprocedural changes of recent coil  embolization of pseudoaneurysm in the left lobe of the liver without definite residual perfusion of the pseudoaneurysm. 2. Small to moderate volume of intermediate to high attenuation ascites in the peritoneal cavity reflecting the presence of blood products within the ascites. No well organized fluid collection identified to suggest abscess in the abdomen or pelvis. 3. Dependent opacities in the lung bases predominantly atelectatic, although some areas of airspace consolidation are evident, which could reflect resolving infection or sequela of aspiration. 4. Pleural nodularity in the posterior aspect of the right hemithorax. This is of uncertain etiology and significance, but the possibility of malignancy is not entirely excluded.  Follow-up contrast-enhanced chest CT is recommended in 2 months to re-evaluate this finding and ensure the stability or regression. At the time of the follow-up imaging, attention to multiple borderline enlarged mediastinal and bilateral hilar lymph nodes is also suggested to ensure stability or regression. 5. Cardiomegaly. 6. 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 7. Mild diffuse bronchial wall thickening with mild centrilobular and paraseptal emphysema; imaging findings suggestive of underlying COPD. Electronically Signed   By: Trudie Reed M.D.   On: 02/11/2023 06:59   DG Chest 1 View  Result Date: 02/11/2023 CLINICAL DATA:  Shortness of breath, fever and hypoxia. 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 EXAM: CHEST  1 VIEW COMPARISON:  CTA chest 02/09/2023 FINDINGS: There is mild cardiomegaly. Vascular markings are normal  caliber. The lungs show chronic changes and linear platelike basilar atelectasis without appreciable infiltrates. A single surgical clip superimposes in the right upper to mid lung field. The mediastinum is normally outlined. The thoracic cage is intact.  There is overlying monitor wiring. Compare: Overall aeration seems unchanged. IMPRESSION: Chronic lung changes with no evidence of acute chest process. Mild cardiomegaly. Electronically Signed   By: Almira Bar M.D.   On: 02/11/2023 06:12     LOS: 1 day   Jeoffrey Massed, MD  [facility]    To contact the attending provider between 7A-7P or the covering provider during after hours 7P-7A, please log into the web site [email or web address] and access using universal [place] password for that web site. If you do not have the password, please call the hospital operator.  02/12/2023, 10:07 AM

## 2023-02-12 NOTE — Progress Notes (Cosign Needed Addendum)
[facility] Progress Note   Subjective:    Seen and examined patient at bedside. Tolerated yesterday's HD with net UF 2.5L. BP stable at bedside. No acute issues as of now.  Objective Vitals:   02/12/23 1144 02/12/23 1145 02/12/23 1146 02/12/23 1147  BP:    111/70  Pulse: 70 71 73 76  Resp: 16 16 15 19   Temp:    97.7 F (36.5 C)  TempSrc:    Oral  SpO2: (!) 89% (!) 89% (!) 85% 90%  Weight:      Height:       Physical Exam General: Awake, alert, NAD, on 4L [place] Heart: S1 and S2; No murmurs, gallops, or rubs Lungs: Clear bilaterally; No wheezing, rales, or rhonchi Abdomen: Mildly distended; +BS Extremities: No LE edema Dialysis Access: L AVF (+) B/T   Filed Weights   02/11/23 0521 02/11/23 1831 02/11/23 2259  Weight: 65.8 kg 66.8 kg 64.3 kg    Intake/Output Summary (Last 24 hours) at 02/12/2023 1153 Last data filed at 02/12/2023 0100 Gross per 24 hour  Intake 460 ml  Output 2500 ml  Net -2040 ml    Additional Objective Labs: Basic Metabolic Panel: Recent Labs  Lab 02/10/23 0213 02/11/23 0547 02/12/23 0743  NA 132* 128* 133*  K 5.2* 5.9* 4.9  CL 96* 94* 94*  CO2 21* 19* 25  GLUCOSE 121* 88 86  BUN 47* 71* 47*  CREATININE 7.09* 9.90* 6.88*  CALCIUM 9.7 9.6 8.9  PHOS 7.2*  --   --    Liver Function Tests: Recent Labs  Lab 02/09/23 1052 02/11/23 0547 02/12/23 0743  AST 46* 201* 163*  ALT 25 116* 129*  ALKPHOS 172* 102 96  BILITOT 1.3* 1.6* 1.5*  PROT 9.1* 7.7 7.3  ALBUMIN 3.5 3.6 2.4*   Recent Labs  Lab 02/09/23 1052  LIPASE 32   CBC: Recent Labs  Lab 02/09/23 1720 02/10/23 0213 02/10/23 1122 02/11/23 0547 02/12/23 0743  WBC 7.9 11.0* 19.7* 28.4* 19.9*  NEUTROABS  --   --   --  23.1* 17.9*  HGB 10.4* 10.9* 10.9* 10.0* 9.4*  HCT 33.2* 33.2* 33.8* 29.3* 27.9*  MCV 92.2 90.0 91.4 88.0 86.4  PLT 161 203 183 147* 148*   Blood Culture    Component Value Date/Time   SDES  02/11/2023 0547    BLOOD RIGHT UPPER ARM Performed  at [facility], [address], [place], [place] [address]    [facility]  02/11/2023 0547    BOTTLES DRAWN AEROBIC AND ANAEROBIC Blood Culture adequate volume Performed at [facility], [address] [place], [place] [address]    CULT  02/11/2023 0547    NO GROWTH < 24 HOURS Performed at [facility], [address], [place], [place] [address]    REPTSTATUS PENDING 02/11/2023 9147    Cardiac Enzymes: No results for input(s): "CKTOTAL", "CKMB", "CKMBINDEX", "TROPONINI" in the last 168 hours. CBG: No results for input(s): "GLUCAP" in the last 168 hours. Iron Studies: No results for input(s): "IRON", "TIBC", "TRANSFERRIN", "FERRITIN" in the last 72 hours. Lab Results  Component Value Date   INR 1.6 (H) 02/11/2023   INR 1.2 02/09/2023   INR 1.2 01/30/2022   Studies/Results: CT Angio Chest/Abd/Pel for Dissection W and/or Wo Contrast  Result Date: 02/11/2023 CLINICAL DATA:  47 year old male with history of worsening pain, fever and hypoxia. Recent history of hepatic arteriovenous malformation coil procedure. EXAM: CT ANGIOGRAPHY CHEST, ABDOMEN AND PELVIS TECHNIQUE: Non-contrast CT  of the chest was initially obtained. Multidetector CT imaging through the chest, abdomen and pelvis was performed using the standard protocol during bolus administration of intravenous contrast. Multiplanar reconstructed images and MIPs were obtained and reviewed to evaluate the vascular anatomy. RADIATION DOSE REDUCTION: This exam was performed according to the departmental dose-optimization program which includes automated exposure control, adjustment of the mA and/or kV according to patient size and/or use of iterative reconstruction technique. CONTRAST:  60mL OMNIPAQUE IOHEXOL 350 MG/ML SOLN COMPARISON:  CTA of the chest, abdomen and pelvis 02/09/2023. FINDINGS: CTA CHEST FINDINGS Cardiovascular: Heart size is mildly enlarged. There is no significant pericardial  fluid, thickening or pericardial calcification. No acute abnormality of the thoracic aorta or the great vessels of the mediastinum. Ascending thoracic aorta, mid aortic arch and descending thoracic aorta measure 3.2 cm, 3.0 cm and 2.3 cm in diameter respectively. No evidence of thoracic aortic dissection. Mediastinum/Nodes: Multiple prominent borderline enlarged mediastinal and bilateral hilar lymph nodes measuring up to 1.4 cm in short axis in the subcarinal nodal station, nonspecific, but similar to the prior study, and favored to be benign. Esophagus is unremarkable in appearance. No axillary lymphadenopathy. Lungs/Pleura: Dependent opacities are noted in the lungs bilaterally (left-greater-than-right), which appear predominantly atelectatic, although less well-defined areas of ground-glass attenuation and airspace consolidation are noted in the extreme lung bases, which may suggest areas of infectious consolidation and inflammation. No pleural effusions. Pleural nodularity is noted in the right hemithorax, best appreciated on axial images 48 and 49 of series 4, where the largest pleural nodule measures 1.7 x 1.6 cm (40 HU). No left-sided pleural nodularity is noted. Mild diffuse bronchial wall thickening with mild centrilobular and paraseptal emphysema. No definite suspicious appearing pulmonary nodules or masses are noted. Musculoskeletal: There are no aggressive appearing lytic or blastic lesions noted in the visualized portions of the skeleton. Review of the MIP images confirms the above findings. CTA ABDOMEN AND PELVIS FINDINGS VASCULAR Aorta: Aortic atherosclerosis. Normal caliber aorta without aneurysm, dissection, vasculitis or significant stenosis. Celiac: Embolization coils are noted in the region of the of the left hepatic artery. Celiac axis and branches are otherwise without evidence of aneurysm, dissection, vasculitis or significant stenosis. SMA: Patent without evidence of aneurysm, dissection,  vasculitis or significant stenosis. Renals: Both renal arteries are patent without evidence of aneurysm, dissection, vasculitis, fibromuscular dysplasia or significant stenosis. IMA: Patent without evidence of aneurysm, dissection, vasculitis or significant stenosis. Inflow: Patent without evidence of significant stenosis. Mild aneurysmal dilatation of the right common iliac artery (1.7 cm in diameter). Short segment dissection of the distal left common iliac artery which propagates into the left external and internal iliac arteries over a very short distance, and is associated with mild aneurysmal dilatation of the left internal iliac artery (axial image 231 of series 4) measuring 1.1 cm in diameter, similar to the prior study. Veins: No obvious venous abnormality within the limitations of this arterial phase study. Review of the MIP images confirms the above findings. NON-VASCULAR Hepatobiliary: Mild enlargement and heterogeneous attenuation in the left lobe of the liver predominantly in segment 3 related to prior intrahepatic hemorrhage and subsequent embolization. Perfusion throughout this region appears slightly diminished, likely secondary to embolization of branches of the left hepatic artery, without definitive evidence of persistent perfusion of the previously embolized pseudoaneurysm. No well organized fluid collection is confidently identified at this time. Liver is otherwise diffusely enlarged, but no focal hepatic lesions are noted. No intra or extrahepatic biliary ductal dilatation. High attenuation material  within the lumen of the gallbladder is compatible with vicarious excretion of contrast material related to prior contrast-enhanced CT examinations and embolization procedure. Pancreas: No pancreatic mass. No pancreatic ductal dilatation. No pancreatic or peripancreatic fluid collections or inflammatory changes. Spleen: Unremarkable. Adrenals/Urinary Tract: Bilateral kidneys and adrenal glands are  normal in appearance. No hydroureteronephrosis. Urinary bladder is nearly completely decompressed and otherwise unremarkable in appearance. Stomach/Bowel: The appearance of the stomach is normal. There is no pathologic dilatation of small bowel or colon. Normal appendix. Lymphatic: No lymphadenopathy noted in the abdomen or pelvis. Reproductive: Prostate gland is unremarkable in appearance. Other: Small to moderate volume of ascites, most evident adjacent to the liver and in the low anatomic pelvis. This is intermediate to high attenuation, indicative of internal blood products. Musculoskeletal: There are no aggressive appearing lytic or blastic lesions noted in the visualized portions of the skeleton. Review of the MIP images confirms the above findings. IMPRESSION: 1. 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 2. Small to moderate volume of intermediate to high attenuation ascites in the peritoneal cavity reflecting the presence of blood products within the ascites. No well organized fluid collection identified to suggest abscess in the abdomen or pelvis. 3. Dependent opacities in the lung bases predominantly atelectatic, although some areas of airspace consolidation are evident, which could reflect resolving infection or sequela of aspiration. 4. Pleural nodularity in the posterior aspect of the right hemithorax. This is of uncertain etiology and significance, but the possibility of malignancy is not entirely excluded. Follow-up contrast-enhanced chest CT is recommended in 2 months to re-evaluate this finding and ensure the stability or regression. At the time of the follow-up imaging, attention to multiple borderline enlarged mediastinal and bilateral hilar lymph nodes is also suggested to ensure stability or regression. 5. Cardiomegaly. 6. Aortic atherosclerosis with mild aneurysmal dilatation of  the right common iliac artery, and short segment dissection of the distal left common iliac artery which propagates into the proximal aspects of the left external and internal iliac arteries, with mild aneurysmal dilatation of the proximal left internal iliac artery, similar to the prior study, as above. 7. Mild diffuse bronchial wall thickening with mild centrilobular and paraseptal emphysema; imaging findings suggestive of underlying COPD. Electronically Signed   By: Trudie Reed M.D.   On: 02/11/2023 06:59   DG Chest 1 View  Result Date: 02/11/2023 CLINICAL DATA:  Shortness of breath, fever and hypoxia. 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 EXAM: CHEST  1 VIEW COMPARISON:  CTA chest 02/09/2023 FINDINGS: There is mild cardiomegaly. Vascular markings are normal caliber. The lungs show chronic changes and linear platelike basilar atelectasis without appreciable infiltrates. A single surgical clip superimposes in the right upper to mid lung field. The mediastinum is normally outlined. The thoracic cage is intact.  There is overlying monitor wiring. Compare: Overall aeration seems unchanged. IMPRESSION: Chronic lung changes with no evidence of acute chest process. Mild cardiomegaly. Electronically Signed   By: Almira Bar M.D.   On: 02/11/2023 06:12    Medications:  levofloxacin (LEVAQUIN) IV 500 mg (02/12/23 1038)   metronidazole 500 mg (02/12/23 0846)    amLODipine  10 mg Oral Daily   Chlorhexidine Gluconate Cloth  6 each Topical Q0600   cloNIDine  0.3 mg Oral TID   docusate sodium  100 mg Oral BID   labetalol  400 mg Oral BID  multivitamin  1 tablet Oral Daily   pantoprazole  40 mg Oral Daily   polyethylene glycol  17 g Oral BID   sevelamer carbonate  800 mg Oral TID with meals    Dialysis Orders: [facility]  4[facility]   400/600  66.5kg  3K/ 2.5 bath AVF   Heparin 2500 - last HD 5/11 (Sat), post wt 66.1kg - missed HD yesterday 5/13 - hectorol 2 mcg IV  tiw - venofer 50mg  IV weekly - mircera 40 mcg IV q 2wks, last 5/02, due 5/16  Home meds include - renvela 800 tid, norvasc 10, phoslo 667 mg ac tid, clonidine 0.3 tid, labetalol 400 bid, remeron, renavit, protonix, zoloft, demadex, prns/ vits/ supps   Assessment/Plan: Abd pain/ fevers/ bloody ascites by CT - suspected liver necrosis s/p embolization of hepatic artery branch 2 days ago. Surgery consulted and reviewed note: no surgical intervention at this time, ABD pain likely from mild hemoperitoneum and/or poss sub segmental liver necrosis. Now afebrile. Continue empiric IV abx , blood cx's from 5/14 NTD so far, gentle IVF"s given esrd status.  ESRD - on HD 4x/ week, missed Mon and Tues this week. More likely will do TTS while here. Received HD overnight. Will hold Heparin bolus for now given recent liver bleed. Next HD 5/16.  HTN/ volume - His volume and Bps are improving. On multiple HTN medications but there is a concern for overall compliance. Not grossly vol overloaded. Under EDW after yesterday's HD. Small UF goal w/ HD. May need to lower EDW at discharge.  Anemia esrd - Hb 10-11 here, next esa due 5/16. Follow.  MBD ckd - CCa in range, phos a little high. Cont phoslo when eating meals. Cont IV vdra.  Salome Holmes, NP [facility] 02/12/2023,11:53 AM  LOS: 1 day

## 2023-02-12 NOTE — Progress Notes (Signed)
Pt receives out-pt HD at [facility] [address]. Will assist as needed.   Olivia Canter Renal Navigator [phone]

## 2023-02-12 NOTE — Progress Notes (Signed)
Progress Note     Subjective: Pt reports persistent abdominal pain but not really worse with palpation. Denies nausea or vomiting. Says he has not been allowed to eat but open applesauce on table. Passing flatus. Reports mild distention. Thankful he does not need acute surgical intervention.   Objective: Vital signs in last 24 hours: Temp:  [97.7 F (36.5 C)-99.1 F (37.3 C)] 98 F (36.7 C) (05/15 0748) Pulse Rate:  [39-98] 74 (05/15 0836) Resp:  [10-38] 23 (05/15 0748) BP: (77-186)/(55-109) 125/79 (05/15 0836) SpO2:  [71 %-100 %] 96 % (05/15 0748) Weight:  [64.3 kg-66.8 kg] 64.3 kg (05/14 2259)    Intake/Output from previous day: 05/14 0701 - 05/15 0700 In: 460 [P.O.:360; IV Piggyback:100] Out: 2500  Intake/Output this shift: No intake/output data recorded.  PE: General: WD, thin male who is laying in bed in NAD HEENT: sclera anicteric  Heart: regular, rate, and rhythm.   Lungs: Respiratory effort nonlabored Abd: soft, mild generalized ttp without peritonitis, mild to moderate distention, +BS MS: all 4 extremities are symmetrical with no cyanosis, clubbing, or edema.   Lab Results:  Recent Labs    02/11/23 0547 02/12/23 0743  WBC 28.4* 19.9*  HGB 10.0* 9.4*  HCT 29.3* 27.9*  PLT 147* 148*   BMET Recent Labs    02/11/23 0547 02/12/23 0743  NA 128* 133*  K 5.9* 4.9  CL 94* 94*  CO2 19* 25  GLUCOSE 88 86  BUN 71* 47*  CREATININE 9.90* 6.88*  CALCIUM 9.6 8.9   PT/INR Recent Labs    02/09/23 1720 02/11/23 0850  LABPROT 15.0 19.4*  INR 1.2 1.6*   CMP     Component Value Date/Time   NA 133 (L) 02/12/2023 0743   NA 133 (L) 01/18/2022 1146   K 4.9 02/12/2023 0743   CL 94 (L) 02/12/2023 0743   CO2 25 02/12/2023 0743   GLUCOSE 86 02/12/2023 0743   BUN 47 (H) 02/12/2023 0743   BUN 26 (H) 01/18/2022 1146   CREATININE 6.88 (H) 02/12/2023 0743   CALCIUM 8.9 02/12/2023 0743   PROT 7.3 02/12/2023 0743   ALBUMIN 2.4 (L) 02/12/2023 0743   AST 163  (H) 02/12/2023 0743   ALT 129 (H) 02/12/2023 0743   ALKPHOS 96 02/12/2023 0743   BILITOT 1.5 (H) 02/12/2023 0743   GFRNONAA 9 (L) 02/12/2023 0743   Lipase     Component Value Date/Time   LIPASE 32 02/09/2023 1052       Studies/Results: CT Angio Chest/Abd/Pel for Dissection W and/or Wo Contrast  Result Date: 02/11/2023 CLINICAL DATA:  47 year old male with history of worsening pain, fever and hypoxia. Recent history of hepatic arteriovenous malformation coil procedure. EXAM: CT ANGIOGRAPHY CHEST, ABDOMEN AND PELVIS TECHNIQUE: Non-contrast CT of the chest was initially obtained. Multidetector CT imaging through the chest, abdomen and pelvis was performed using the standard protocol during bolus administration of intravenous contrast. Multiplanar reconstructed images and MIPs were obtained and reviewed to evaluate the vascular anatomy. RADIATION DOSE REDUCTION: This exam was performed according to the departmental dose-optimization program which includes automated exposure control, adjustment of the mA and/or kV according to patient size and/or use of iterative reconstruction technique. CONTRAST:  60mL OMNIPAQUE IOHEXOL 350 MG/ML SOLN COMPARISON:  CTA of the chest, abdomen and pelvis 02/09/2023. FINDINGS: CTA CHEST FINDINGS Cardiovascular: Heart size is mildly enlarged. There is no significant pericardial fluid, thickening or pericardial calcification. No acute abnormality of the thoracic aorta or the great vessels of the mediastinum.  Ascending thoracic aorta, mid aortic arch and descending thoracic aorta measure 3.2 cm, 3.0 cm and 2.3 cm in diameter respectively. No evidence of thoracic aortic dissection. Mediastinum/Nodes: Multiple prominent borderline enlarged mediastinal and bilateral hilar lymph nodes measuring up to 1.4 cm in short axis in the subcarinal nodal station, nonspecific, but similar to the prior study, and favored to be benign. Esophagus is unremarkable in appearance. No axillary  lymphadenopathy. Lungs/Pleura: Dependent opacities are noted in the lungs bilaterally (left-greater-than-right), which appear predominantly atelectatic, although less well-defined areas of ground-glass attenuation and airspace consolidation are noted in the extreme lung bases, which may suggest areas of infectious consolidation and inflammation. No pleural effusions. Pleural nodularity is noted in the right hemithorax, best appreciated on axial images 48 and 49 of series 4, where the largest pleural nodule measures 1.7 x 1.6 cm (40 HU). No left-sided pleural nodularity is noted. Mild diffuse bronchial wall thickening with mild centrilobular and paraseptal emphysema. No definite suspicious appearing pulmonary nodules or masses are noted. Musculoskeletal: There are no aggressive appearing lytic or blastic lesions noted in the visualized portions of the skeleton. Review of the MIP images confirms the above findings. CTA ABDOMEN AND PELVIS FINDINGS VASCULAR Aorta: Aortic atherosclerosis. Normal caliber aorta without aneurysm, dissection, vasculitis or significant stenosis. Celiac: Embolization coils are noted in the region of the of the left hepatic artery. Celiac axis and branches are otherwise without evidence of aneurysm, dissection, vasculitis or significant stenosis. SMA: Patent without evidence of aneurysm, dissection, vasculitis or significant stenosis. Renals: Both renal arteries are patent without evidence of aneurysm, dissection, vasculitis, fibromuscular dysplasia or significant stenosis. IMA: Patent without evidence of aneurysm, dissection, vasculitis or significant stenosis. Inflow: Patent without evidence of significant stenosis. Mild aneurysmal dilatation of the right common iliac artery (1.7 cm in diameter). Short segment dissection of the distal left common iliac artery which propagates into the left external and internal iliac arteries over a very short distance, and is associated with mild aneurysmal  dilatation of the left internal iliac artery (axial image 231 of series 4) measuring 1.1 cm in diameter, similar to the prior study. Veins: No obvious venous abnormality within the limitations of this arterial phase study. Review of the MIP images confirms the above findings. NON-VASCULAR Hepatobiliary: Mild enlargement and heterogeneous attenuation in the left lobe of the liver predominantly in segment 3 related to prior intrahepatic hemorrhage and subsequent embolization. Perfusion throughout this region appears slightly diminished, likely secondary to embolization of branches of the left hepatic artery, without definitive evidence of persistent perfusion of the previously embolized pseudoaneurysm. No well organized fluid collection is confidently identified at this time. Liver is otherwise diffusely enlarged, but no focal hepatic lesions are noted. No intra or extrahepatic biliary ductal dilatation. High attenuation material within the lumen of the gallbladder is compatible with vicarious excretion of contrast material related to prior contrast-enhanced CT examinations and embolization procedure. Pancreas: No pancreatic mass. No pancreatic ductal dilatation. No pancreatic or peripancreatic fluid collections or inflammatory changes. Spleen: Unremarkable. Adrenals/Urinary Tract: Bilateral kidneys and adrenal glands are normal in appearance. No hydroureteronephrosis. Urinary bladder is nearly completely decompressed and otherwise unremarkable in appearance. Stomach/Bowel: The appearance of the stomach is normal. There is no pathologic dilatation of small bowel or colon. Normal appendix. Lymphatic: No lymphadenopathy noted in the abdomen or pelvis. Reproductive: Prostate gland is unremarkable in appearance. Other: Small to moderate volume of ascites, most evident adjacent to the liver and in the low anatomic pelvis. This is intermediate to high attenuation,  indicative of internal blood products. Musculoskeletal:  There are no aggressive appearing lytic or blastic lesions noted in the visualized portions of the skeleton. Review of the MIP images confirms the above findings. IMPRESSION: 1. 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 2. Small to moderate volume of intermediate to high attenuation ascites in the peritoneal cavity reflecting the presence of blood products within the ascites. No well organized fluid collection identified to suggest abscess in the abdomen or pelvis. 3. Dependent opacities in the lung bases predominantly atelectatic, although some areas of airspace consolidation are evident, which could reflect resolving infection or sequela of aspiration. 4. Pleural nodularity in the posterior aspect of the right hemithorax. This is of uncertain etiology and significance, but the possibility of malignancy is not entirely excluded. Follow-up contrast-enhanced chest CT is recommended in 2 months to re-evaluate this finding and ensure the stability or regression. At the time of the follow-up imaging, attention to multiple borderline enlarged mediastinal and bilateral hilar lymph nodes is also suggested to ensure stability or regression. 5. Cardiomegaly. 6. 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 7. Mild diffuse bronchial wall thickening with mild centrilobular and paraseptal emphysema; imaging findings suggestive of underlying COPD. Electronically Signed   By: Trudie Reed M.D.   On: 02/11/2023 06:59   DG Chest 1 View  Result Date: 02/11/2023 CLINICAL DATA:  Shortness of breath,  fever and hypoxia. 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 EXAM: CHEST  1 VIEW COMPARISON:  CTA chest 02/09/2023 FINDINGS: There is mild cardiomegaly. Vascular markings are normal caliber. The lungs show chronic changes and linear platelike basilar atelectasis without appreciable infiltrates. A single surgical clip superimposes in the right upper to mid lung field. The mediastinum is normally outlined. The thoracic cage is intact.  There is overlying monitor wiring. Compare: Overall aeration seems unchanged. IMPRESSION: Chronic lung changes with no evidence of acute chest process. Mild cardiomegaly. Electronically Signed   By: Almira Bar M.D.   On: 02/11/2023 06:12    Anti-infectives: Anti-infectives (From admission, onward)    Start     Dose/Rate Route Frequency Ordered Stop   02/12/23 1000  levofloxacin (LEVAQUIN) IVPB 500 mg        500 mg 100 mL/hr over 60 Minutes Intravenous Every 48 hours 02/11/23 1637     02/11/23 1715  metroNIDAZOLE (FLAGYL) IVPB 500 mg        500 mg 100 mL/hr over 60 Minutes Intravenous Every 12 hours 02/11/23 1626     02/11/23 1645  levofloxacin (LEVAQUIN) IVPB 750 mg        750 mg 100 mL/hr over 90 Minutes Intravenous STAT 02/11/23 1626 02/12/23 0840   02/11/23 0645  metroNIDAZOLE (FLAGYL) IVPB 500 mg        500 mg 100 mL/hr over 60 Minutes Intravenous  Once 02/11/23 0633 02/11/23 0749   02/11/23 0645  vancomycin (VANCOCIN) IVPB 1000 mg/200 mL premix        1,000 mg 200 mL/hr over 60 Minutes Intravenous  Once 02/11/23 0634 02/11/23 0833   02/11/23 0645  ceFEPIme (MAXIPIME) 2 g in sodium chloride 0.9 %  100 mL IVPB        2 g 200 mL/hr over 30 Minutes Intravenous  Once 02/11/23 1610 02/11/23 9604        Assessment/Plan  Abdominal pain Hemoperitoneum Hepatic artery aneurysm, s/p coiling ESRD on HD.  HTN, cause of ESRD Substance abuse Anemia of chronic disease.    LFTs stable to improving, no concern for superimposed  infection over subsegmental liver necrosis at this time. Hgb 9.4 from 10.0. Having bowel function. Recommend mobilization as able if cleared by IR to optimize bowel function. Recommend bowel regimen and advance diet as tolerated. Pain control per primary team. No other recommendations from a general surgery standpoint at this time. We will sign off, please call if we can be of further assistance.    LOS: 1 day   I reviewed nursing notes, hospitalist notes, last 24 h vitals and pain scores, last 48 h intake and output, last 24 h labs and trends, and last 24 h imaging results.   Juliet Rude, [facility] 02/12/2023, 11:41 AM Please see Amion for pager number during day hours 7:00am-4:30pm

## 2023-02-12 NOTE — Progress Notes (Signed)
Patient desats on 6L nasal cannula to 80% while asleep, Raised HOB sats increased to 90%.  Provider notified.

## 2023-02-13 DIAGNOSIS — N186 End stage renal disease: Secondary | ICD-10-CM

## 2023-02-13 DIAGNOSIS — K5900 Constipation, unspecified: Secondary | ICD-10-CM | POA: Diagnosis not present

## 2023-02-13 DIAGNOSIS — I10 Essential (primary) hypertension: Secondary | ICD-10-CM | POA: Diagnosis not present

## 2023-02-13 DIAGNOSIS — A419 Sepsis, unspecified organism: Secondary | ICD-10-CM | POA: Diagnosis not present

## 2023-02-13 LAB — CBC WITH DIFFERENTIAL/PLATELET
Abs Immature Granulocytes: 0.08 10*3/uL — ABNORMAL HIGH (ref 0.00–0.07)
Basophils Absolute: 0 10*3/uL (ref 0.0–0.1)
Basophils Relative: 0 %
Eosinophils Absolute: 0.1 10*3/uL (ref 0.0–0.5)
Eosinophils Relative: 1 %
HCT: 27.2 % — ABNORMAL LOW (ref 39.0–52.0)
Hemoglobin: 9.2 g/dL — ABNORMAL LOW (ref 13.0–17.0)
Immature Granulocytes: 1 %
Lymphocytes Relative: 7 %
Lymphs Abs: 0.9 10*3/uL (ref 0.7–4.0)
MCH: 29.6 pg (ref 26.0–34.0)
MCHC: 33.8 g/dL (ref 30.0–36.0)
MCV: 87.5 fL (ref 80.0–100.0)
Monocytes Absolute: 1.6 10*3/uL — ABNORMAL HIGH (ref 0.1–1.0)
Monocytes Relative: 13 %
Neutro Abs: 9.6 10*3/uL — ABNORMAL HIGH (ref 1.7–7.7)
Neutrophils Relative %: 78 %
Platelets: 168 10*3/uL (ref 150–400)
RBC: 3.11 MIL/uL — ABNORMAL LOW (ref 4.22–5.81)
RDW: 16.8 % — ABNORMAL HIGH (ref 11.5–15.5)
WBC: 12.2 10*3/uL — ABNORMAL HIGH (ref 4.0–10.5)
nRBC: 0 % (ref 0.0–0.2)

## 2023-02-13 LAB — COMPREHENSIVE METABOLIC PANEL
ALT: 107 U/L — ABNORMAL HIGH (ref 0–44)
AST: 121 U/L — ABNORMAL HIGH (ref 15–41)
Albumin: 2.4 g/dL — ABNORMAL LOW (ref 3.5–5.0)
Alkaline Phosphatase: 216 U/L — ABNORMAL HIGH (ref 38–126)
Anion gap: 17 — ABNORMAL HIGH (ref 5–15)
BUN: 68 mg/dL — ABNORMAL HIGH (ref 6–20)
CO2: 24 mmol/L (ref 22–32)
Calcium: 8.8 mg/dL — ABNORMAL LOW (ref 8.9–10.3)
Chloride: 90 mmol/L — ABNORMAL LOW (ref 98–111)
Creatinine, Ser: 8.54 mg/dL — ABNORMAL HIGH (ref 0.61–1.24)
GFR, Estimated: 7 mL/min — ABNORMAL LOW (ref >60)
Glucose, Bld: 91 mg/dL (ref 70–99)
Potassium: 5 mmol/L (ref 3.5–5.1)
Sodium: 131 mmol/L — ABNORMAL LOW (ref 135–145)
Total Bilirubin: 2 mg/dL — ABNORMAL HIGH (ref 0.3–1.2)
Total Protein: 7.1 g/dL (ref 6.5–8.1)

## 2023-02-13 LAB — CULTURE, BLOOD (ROUTINE X 2): Culture: NO GROWTH

## 2023-02-13 MED ORDER — HEPARIN SODIUM (PORCINE) 5000 UNIT/ML IJ SOLN
5000.0000 [IU] | Freq: Three times a day (TID) | INTRAMUSCULAR | Status: DC
  Administered 2023-02-13 – 2023-02-14 (×2): 5000 [IU] via SUBCUTANEOUS
  Filled 2023-02-13 (×2): qty 1

## 2023-02-13 MED ORDER — DARBEPOETIN ALFA 60 MCG/0.3ML IJ SOSY
60.0000 ug | PREFILLED_SYRINGE | INTRAMUSCULAR | Status: DC
  Administered 2023-02-13: 60 ug via SUBCUTANEOUS
  Filled 2023-02-13: qty 0.3

## 2023-02-13 MED ORDER — LACTULOSE 10 GM/15ML PO SOLN
30.0000 g | Freq: Once | ORAL | Status: AC
  Administered 2023-02-13: 30 g via ORAL
  Filled 2023-02-13: qty 60

## 2023-02-13 MED ORDER — SENNOSIDES-DOCUSATE SODIUM 8.6-50 MG PO TABS
2.0000 | ORAL_TABLET | Freq: Two times a day (BID) | ORAL | Status: DC
  Filled 2023-02-13: qty 2

## 2023-02-13 MED ORDER — POLYETHYLENE GLYCOL 3350 17 G PO PACK
17.0000 g | PACK | Freq: Three times a day (TID) | ORAL | Status: DC
  Administered 2023-02-13: 17 g via ORAL
  Filled 2023-02-13 (×2): qty 1

## 2023-02-13 NOTE — Progress Notes (Signed)
PROGRESS NOTE        PATIENT DETAILS Name: Roy Randolph Age: 47 y.o. Sex: male Date of Birth: May 05, 1976 Admit Date: 02/11/2023 Admitting Physician Jonah Blue, MD ZOX:[id number], Binnie Rail, MD  Brief Summary: Patient is a 47 y.o.  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  Significant events: 5/12-5/13>> hospitalization for spontaneous acute hepatic hematoma-s/p call embolization of pseudoaneurysm hepatic artery by IR-patient signed out AMA. 5/14>> back to the ED for severe abdominal pain-thought to have post embolization syndrome/hepatic necrosis-admit to [facility].  Significant studies: 5/14>> CT angio chest/abdomen/pelvis: Recent: Ligation of pseudoaneurysm in the left lobe of liver without definite residual perforation of pseudoaneurysm, small-moderate hemorrhagic ascites.   Significant microbiology data: 5/14>> blood culture: No growth 5/14>> COVID PCR: Negative  Procedures: None  Consults: IR CCS  Subjective: Slowly improving continues to have some abdominal pain.  Has not had a BM since this Monday.  Objective: Vitals: Blood pressure (!) 153/77, pulse 77, temperature 98.2 F (36.8 C), resp. rate (!) 28, height 5\' 9"  (1.753 m), weight 64.9 kg, SpO2 100 %.   Exam: Gen Exam:Alert awake-not in any distress HEENT:atraumatic, normocephalic Chest: B/L clear to auscultation anteriorly CVS:S1S2 regular Abdomen:soft-remains tender in the mid abdominal area. Extremities:no edema Neurology: Non focal Skin: no rash  Pertinent Labs/Radiology:    Latest Ref Rng &  Units 02/13/2023    4:17 AM 02/12/2023    7:43 AM 02/11/2023    5:47 AM  CBC  WBC 4.0 - 10.5 K/uL 12.2  19.9  28.4   Hemoglobin 13.0 - 17.0 g/dL 9.2  9.4  09.8   Hematocrit 39.0 - 52.0 % 27.2  27.9  29.3   Platelets 150 - 400 K/uL 168  148  147     Lab Results  Component Value Date   NA 131 (L) 02/13/2023   K 5.0 02/13/2023   CL 90 (L) 02/13/2023   CO2 24 02/13/2023      Assessment/Plan: Abdominal pain-likely due to post embolization syndrome/possibly subsegmental liver necrosis Abdominal pain slowly improving Diet being advanced-suspect could advance to regular diet today Empiric Levaquin/Flagyl IR/CCS following  OSA Acknowledges being diagnosed with OSA-but never "picked up" CPAP Requiring up to 6 L of oxygen mostly nocturnally CPAP nightly while hospitalized  ESRD on HD 4x/week Noncompliant-missed HD several days this week per nephrology Nephrology following and directing care  Normocytic anemia Secondary to ESRD Aranesp/iron per nephrology  HTN BP stable Continue amlodipine/clonidine/labetalol Per prior notes-poor compliance-prior history of PRES  Constipation Probably secondary to narcotics Continue MiraLAX/senna-dosage adjusted-last BM 5/3 1 dose of lactulose today  Tobacco/alcohol use Counseled Watch for withdrawal symptoms  Pleural nodularity posterior aspect of right hemithorax Radiology recommending follow-up contrast-enhanced CT in 2 months.  BMI: Estimated body mass index is 21.13 kg/m as calculated from the following:   Height as of this encounter: 5\' 9"  (1.753 m).   Weight as of this encounter: 64.9 kg.   Code status:   Code Status: Full Code   DVT Prophylaxis: SCDs Start: 02/11/23 1635   Family Communication: None at bedside   Disposition  Plan: Status is: Inpatient Remains inpatient appropriate because: Severity of illness   Planned Discharge Destination:Home   Diet: Diet Order             Diet full liquid Room service  appropriate? Yes; Fluid consistency: Thin  Diet effective now                     Antimicrobial agents: Anti-infectives (From admission, onward)    Start     Dose/Rate Route Frequency Ordered Stop   02/12/23 1000  levofloxacin (LEVAQUIN) IVPB 500 mg        500 mg 100 mL/hr over 60 Minutes Intravenous Every 48 hours 02/11/23 1637     02/11/23 1715  metroNIDAZOLE (FLAGYL) IVPB 500 mg        500 mg 100 mL/hr over 60 Minutes Intravenous Every 12 hours 02/11/23 1626     02/11/23 1645  levofloxacin (LEVAQUIN) IVPB 750 mg        750 mg 100 mL/hr over 90 Minutes Intravenous STAT 02/11/23 1626 02/12/23 0840   02/11/23 0645  metroNIDAZOLE (FLAGYL) IVPB 500 mg        500 mg 100 mL/hr over 60 Minutes Intravenous  Once 02/11/23 0633 02/11/23 0749   02/11/23 0645  vancomycin (VANCOCIN) IVPB 1000 mg/200 mL premix        1,000 mg 200 mL/hr over 60 Minutes Intravenous  Once 02/11/23 0634 02/11/23 0833   02/11/23 0645  ceFEPIme (MAXIPIME) 2 g in sodium chloride 0.9 % 100 mL IVPB        2 g 200 mL/hr over 30 Minutes Intravenous  Once 02/11/23 0634 02/11/23 0727        MEDICATIONS: Scheduled Meds:  amLODipine  10 mg Oral Daily   Chlorhexidine Gluconate Cloth  6 each Topical Q0600   cloNIDine  0.3 mg Oral TID   darbepoetin (ARANESP) injection - DIALYSIS  60 mcg Subcutaneous Q Thu-1800   labetalol  400 mg Oral BID   multivitamin  1 tablet Oral Daily   pantoprazole  40 mg Oral Daily   polyethylene glycol  17 g Oral BID   senna-docusate  2 tablet Oral QHS   sevelamer carbonate  800 mg Oral TID with meals   Continuous Infusions:  levofloxacin (LEVAQUIN) IV 500 mg (02/12/23 1038)   metronidazole 500 mg (02/12/23 2222)   PRN Meds:.acetaminophen **OR** acetaminophen, alteplase, bisacodyl, heparin, hydrALAZINE, HYDROmorphone (DILAUDID) injection, hydrOXYzine, lidocaine (PF), lidocaine-prilocaine, ondansetron **OR** ondansetron (ZOFRAN) IV, pentafluoroprop-tetrafluoroeth   I have  personally reviewed following labs and imaging studies  LABORATORY DATA: CBC: Recent Labs  Lab 02/10/23 0213 02/10/23 1122 02/11/23 0547 02/12/23 0743 02/13/23 0417  WBC 11.0* 19.7* 28.4* 19.9* 12.2*  NEUTROABS  --   --  23.1* 17.9* 9.6*  HGB 10.9* 10.9* 10.0* 9.4* 9.2*  HCT 33.2* 33.8* 29.3* 27.9* 27.2*  MCV 90.0 91.4 88.0 86.4 87.5  PLT 203 183 147* 148* 168     Basic Metabolic Panel: Recent Labs  Lab 02/09/23 0950 02/10/23 0213 02/11/23 0547 02/12/23 0743 02/13/23 0417  NA 136 132* 128* 133* 131*  K 4.6 5.2* 5.9* 4.9 5.0  CL 99 96* 94* 94* 90*  CO2 24 21* 19* 25 24  GLUCOSE 130* 121* 88 86 91  BUN 37* 47* 71* 47* 68*  CREATININE 5.66* 7.09* 9.90* 6.88* 8.54*  CALCIUM 9.7 9.7 9.6 8.9 8.8*  MG  --  2.2  --  1.8  --   PHOS  --  7.2*  --   --   --  GFR: Estimated Creatinine Clearance: 9.9 mL/min (A) (by C-G formula based on SCr of 8.54 mg/dL (H)).  Liver Function Tests: Recent Labs  Lab 02/09/23 1052 02/11/23 0547 02/12/23 0743 02/13/23 0417  AST 46* 201* 163* 121*  ALT 25 116* 129* 107*  ALKPHOS 172* 102 96 216*  BILITOT 1.3* 1.6* 1.5* 2.0*  PROT 9.1* 7.7 7.3 7.1  ALBUMIN 3.5 3.6 2.4* 2.4*    Recent Labs  Lab 02/09/23 1052  LIPASE 32    No results for input(s): "AMMONIA" in the last 168 hours.  Coagulation Profile: Recent Labs  Lab 02/09/23 1720 02/11/23 0850  INR 1.2 1.6*     Cardiac Enzymes: No results for input(s): "CKTOTAL", "CKMB", "CKMBINDEX", "TROPONINI" in the last 168 hours.  BNP (last 3 results) No results for input(s): "PROBNP" in the last 8760 hours.  Lipid Profile: No results for input(s): "CHOL", "HDL", "LDLCALC", "TRIG", "CHOLHDL", "LDLDIRECT" in the last 72 hours.  Thyroid Function Tests: No results for input(s): "TSH", "T4TOTAL", "FREET4", "T3FREE", "THYROIDAB" in the last 72 hours.  Anemia Panel: No results for input(s): "VITAMINB12", "FOLATE", "FERRITIN", "TIBC", "IRON", "RETICCTPCT" in the last 72  hours.  Urine analysis:    Component Value Date/Time   COLORURINE YELLOW 02/11/2023 0650   APPEARANCEUR HAZY (A) 02/11/2023 0650   APPEARANCEUR Clear 12/26/2022 1022   LABSPEC 1.033 (H) 02/11/2023 0650   PHURINE 5.5 02/11/2023 0650   GLUCOSEU NEGATIVE 02/11/2023 0650   HGBUR TRACE (A) 02/11/2023 0650   BILIRUBINUR NEGATIVE 02/11/2023 0650   BILIRUBINUR Negative 12/26/2022 1022   KETONESUR NEGATIVE 02/11/2023 0650   PROTEINUR 100 (A) 02/11/2023 0650   NITRITE NEGATIVE 02/11/2023 0650   LEUKOCYTESUR NEGATIVE 02/11/2023 0650    Sepsis Labs: Lactic Acid, Venous    Component Value Date/Time   LATICACIDVEN 1.3 02/11/2023 0850    MICROBIOLOGY: Recent Results (from the past 240 hour(s))  MRSA Next Gen by PCR, Nasal     Status: Abnormal   Collection Time: 02/09/23  6:31 PM   Specimen: Nasal Mucosa; Nasal Swab  Result Value Ref Range Status   MRSA by PCR Next Gen DETECTED (A) NOT DETECTED Final    Comment: (NOTE) The GeneXpert MRSA Assay (FDA approved for NASAL specimens only), is one component of a comprehensive MRSA colonization surveillance program. It is not intended to diagnose MRSA infection nor to guide or monitor treatment for MRSA infections. Test performance is not FDA approved in patients less than 64 years old. Performed at [facility], [address] [place], [place] [address]   Culture, blood (routine x 2)     Status: None (Preliminary result)   Collection Time: 02/11/23  5:35 AM   Specimen: BLOOD  Result Value Ref Range Status   Specimen Description   Final    BLOOD BLOOD RIGHT FOREARM Performed at [facility], [address], [place], [place] [address]    Special Requests   Final    BOTTLES DRAWN AEROBIC AND ANAEROBIC Blood Culture adequate volume Performed at [facility], [address], [place], [place] [address]    Culture   Final    NO GROWTH 2 DAYS Performed at [facility], [address], [place], [place] [address]    Report Status PENDING  Incomplete  Culture, blood (routine x 2)     Status: None (Preliminary result)   Collection Time: 02/11/23  5:47 AM   Specimen: BLOOD  Result Value Ref Range Status   Specimen Description   Final  BLOOD RIGHT UPPER ARM Performed at [facility], [address], [place], [place] [address]    Special Requests   Final    BOTTLES DRAWN AEROBIC AND ANAEROBIC Blood Culture adequate volume Performed at [facility], [address], [place], [place] [address]    Culture   Final    NO GROWTH 2 DAYS Performed at [facility], [address], [place], [place] [address]    Report Status PENDING  Incomplete  SARS Coronavirus 2 by RT PCR (hospital order, performed in [facility] hospital lab) *cepheid single result test* Anterior Nasal Swab     Status: None   Collection Time: 02/11/23 10:18 AM   Specimen: Anterior Nasal Swab  Result Value Ref Range Status   SARS Coronavirus 2 by RT PCR NEGATIVE NEGATIVE Final    Comment: (NOTE) SARS-CoV-2 target nucleic acids are NOT DETECTED.  The SARS-CoV-2 RNA is generally detectable in upper and lower respiratory specimens during the acute phase of infection. The lowest concentration of SARS-CoV-2 viral copies this assay can detect is 250 copies / mL. A negative result does not preclude SARS-CoV-2 infection and should not be used as the sole basis for treatment or other patient management decisions.  A negative result may occur with improper specimen collection / handling, submission of specimen other than nasopharyngeal swab, presence of viral mutation(s) within the areas targeted by this assay, and inadequate number of viral copies (<250 copies / mL). A negative result must be combined with clinical observations, patient history, and epidemiological information.  Fact Sheet for Patients:   RoadLapTop.co.za  Fact Sheet for  Healthcare Providers: http://kim-miller.com/  This test is not yet approved or  cleared by the [place] FDA and has been authorized for detection and/or diagnosis of SARS-CoV-2 by FDA under an Emergency Use Authorization (EUA).  This EUA will remain in effect (meaning this test can be used) for the duration of the COVID-19 declaration under Section 564(b)(1) of the Act, 21 U.S.C. section 360bbb-3(b)(1), unless the authorization is terminated or revoked sooner.  Performed at [facility], [address] [place], [place] [address]     RADIOLOGY STUDIES/RESULTS: No results found.   LOS: 2 days   Jeoffrey Massed, MD  [facility]    To contact the attending provider between 7A-7P or the covering provider during after hours 7P-7A, please log into the web site [email or web address] and access using universal [place] password for that web site. If you do not have the password, please call the hospital operator.  02/13/2023, 11:16 AM

## 2023-02-13 NOTE — Progress Notes (Signed)
Received patient in bed.Awake,alert and oriented x 4.  Access used : Left upper arm AVF that worked well.  Duration of treatment:3.5 hours.  Fluid removed: 2.5 liters  Hemo issue/comment:None  Hand off to the patient's nurse.

## 2023-02-13 NOTE — Progress Notes (Addendum)
[facility] Progress Note   Subjective:    Seen and examined on HD. Tolerating UFG 2.5L. On 3L [place]. No acute issues.  Objective Vitals:   02/13/23 0930 02/13/23 1000 02/13/23 1032 02/13/23 1034  BP: (!) 147/76 134/81 (!) 151/77   Pulse: 73 (!) 189  78  Resp: (!) 23 (!) 25 (!) 27 (!) 23  Temp:      TempSrc:      SpO2: 100% 100%  100%  Weight:      Height:       Physical Exam General: Awake, alert, NAD, on 3L [place]: S1 and S2; No murmurs, gallops, or rubs Lungs: Clear bilaterally; No wheezing, rales, or rhonchi Abdomen: Mildly distended; +BS Extremities: No LE edema Dialysis Access: L AVF (+) B/T   Filed Weights   02/11/23 1831 02/11/23 2259 02/13/23 0730  Weight: 66.8 kg 64.3 kg 64.9 kg    Intake/Output Summary (Last 24 hours) at 02/13/2023 1047 Last data filed at 02/13/2023 0641 Gross per 24 hour  Intake 120 ml  Output 0 ml  Net 120 ml    Additional Objective Labs: Basic Metabolic Panel: Recent Labs  Lab 02/10/23 0213 02/11/23 0547 02/12/23 0743 02/13/23 0417  NA 132* 128* 133* 131*  K 5.2* 5.9* 4.9 5.0  CL 96* 94* 94* 90*  CO2 21* 19* 25 24  GLUCOSE 121* 88 86 91  BUN 47* 71* 47* 68*  CREATININE 7.09* 9.90* 6.88* 8.54*  CALCIUM 9.7 9.6 8.9 8.8*  PHOS 7.2*  --   --   --    Liver Function Tests: Recent Labs  Lab 02/11/23 0547 02/12/23 0743 02/13/23 0417  AST 201* 163* 121*  ALT 116* 129* 107*  ALKPHOS 102 96 216*  BILITOT 1.6* 1.5* 2.0*  PROT 7.7 7.3 7.1  ALBUMIN 3.6 2.4* 2.4*   Recent Labs  Lab 02/09/23 1052  LIPASE 32   CBC: Recent Labs  Lab 02/10/23 0213 02/10/23 1122 02/11/23 0547 02/12/23 0743 02/13/23 0417  WBC 11.0* 19.7* 28.4* 19.9* 12.2*  NEUTROABS  --   --  23.1* 17.9* 9.6*  HGB 10.9* 10.9* 10.0* 9.4* 9.2*  HCT 33.2* 33.8* 29.3* 27.9* 27.2*  MCV 90.0 91.4 88.0 86.4 87.5  PLT 203 183 147* 148* 168   Blood Culture    Component Value Date/Time   SDES  02/11/2023 0547    BLOOD RIGHT UPPER  ARM Performed at [facility], [address] [place], [place] [address]    [facility]  02/11/2023 0547    BOTTLES DRAWN AEROBIC AND ANAEROBIC Blood Culture adequate volume Performed at [facility], [address] [place], [place] [address]    CULT  02/11/2023 0547    NO GROWTH 2 DAYS Performed at [facility], [address], [place], [place] [address]    REPTSTATUS PENDING 02/11/2023 9147    Cardiac Enzymes: No results for input(s): "CKTOTAL", "CKMB", "CKMBINDEX", "TROPONINI" in the last 168 hours. CBG: No results for input(s): "GLUCAP" in the last 168 hours. Iron Studies: No results for input(s): "IRON", "TIBC", "TRANSFERRIN", "FERRITIN" in the last 72 hours. Lab Results  Component Value Date   INR 1.6 (H) 02/11/2023   INR 1.2 02/09/2023   INR 1.2 01/30/2022   Studies/Results: No results found.  Medications:  levofloxacin (LEVAQUIN) IV 500 mg (02/12/23 1038)   metronidazole 500 mg (02/12/23 2222)    amLODipine  10 mg Oral Daily   Chlorhexidine Gluconate Cloth  6 each Topical Q0600   cloNIDine  0.3 mg Oral TID   labetalol  400 mg Oral BID   multivitamin  1 tablet Oral Daily   pantoprazole  40 mg Oral Daily   polyethylene glycol  17 g Oral BID   senna-docusate  2 tablet Oral QHS   sevelamer carbonate  800 mg Oral TID with meals    Dialysis Orders: [facility]  4h   400/600  66.5kg  3K/ 2.5 bath AVF   Heparin 2500 - last HD 5/11 (Sat), post wt 66.1kg - missed HD yesterday 5/13 - hectorol 2 mcg IV tiw - venofer 50mg  IV weekly - mircera 40 mcg IV q 2wks, last 5/02, due 5/16   Home meds include - renvela 800 tid, norvasc 10, phoslo 667 mg ac tid, clonidine 0.3 tid, labetalol 400 bid, remeron, renavit, protonix, zoloft, demadex, prns/ vits/ supps  Assessment/Plan: Abd pain/ fevers/ bloody ascites by CT - suspected liver necrosis s/p embolization of hepatic artery branch 2 days ago. Surgery consulted and reviewed note:  no surgical intervention at this time, ABD pain likely from mild hemoperitoneum and/or poss sub segmental liver necrosis. Remains afebrile and symptoms are improving. Continue empiric IV abx , blood cx's from 5/14 NTD so far. Surgery has signed off. ESRD - on HD 4x/ week, missed Mon and Tues this week. More likely will do TTS while here. Will hold Heparin bolus for now given recent liver bleed. On HD.  HTN/ volume - His volume and Bps are improving. On multiple HTN medications but there is a concern for overall compliance. Not grossly vol overloaded. Under EDW after yesterday's HD. Small UF goal w/ HD. May need to lower EDW at discharge. Will also consider going back to HD 3x weekly when returning to outpatient, will leave current outpatient HD Rx going for now. Anemia esrd - Hb 9.2, next esa due 5/16. Will start today.  MBD ckd - CCa in range, phos a little high. Cont phoslo when eating meals. Cont IV vdra.  Salome Holmes, NP [facility] 02/13/2023,10:47 AM  LOS: 2 days

## 2023-02-14 ENCOUNTER — Other Ambulatory Visit (HOSPITAL_COMMUNITY): Payer: Self-pay

## 2023-02-14 DIAGNOSIS — J189 Pneumonia, unspecified organism: Secondary | ICD-10-CM

## 2023-02-14 DIAGNOSIS — A419 Sepsis, unspecified organism: Secondary | ICD-10-CM | POA: Diagnosis not present

## 2023-02-14 LAB — CBC WITH DIFFERENTIAL/PLATELET
Abs Immature Granulocytes: 0.15 10*3/uL — ABNORMAL HIGH (ref 0.00–0.07)
Basophils Absolute: 0 10*3/uL (ref 0.0–0.1)
Basophils Relative: 0 %
Eosinophils Absolute: 0.1 10*3/uL (ref 0.0–0.5)
Eosinophils Relative: 1 %
HCT: 26.6 % — ABNORMAL LOW (ref 39.0–52.0)
Hemoglobin: 8.9 g/dL — ABNORMAL LOW (ref 13.0–17.0)
Immature Granulocytes: 1 %
Lymphocytes Relative: 6 %
Lymphs Abs: 0.8 10*3/uL (ref 0.7–4.0)
MCH: 28.8 pg (ref 26.0–34.0)
MCHC: 33.5 g/dL (ref 30.0–36.0)
MCV: 86.1 fL (ref 80.0–100.0)
Monocytes Absolute: 2.6 10*3/uL — ABNORMAL HIGH (ref 0.1–1.0)
Monocytes Relative: 18 %
Neutro Abs: 10.6 10*3/uL — ABNORMAL HIGH (ref 1.7–7.7)
Neutrophils Relative %: 74 %
Platelets: 187 10*3/uL (ref 150–400)
RBC: 3.09 MIL/uL — ABNORMAL LOW (ref 4.22–5.81)
RDW: 17.1 % — ABNORMAL HIGH (ref 11.5–15.5)
WBC: 14.3 10*3/uL — ABNORMAL HIGH (ref 4.0–10.5)
nRBC: 0 % (ref 0.0–0.2)

## 2023-02-14 LAB — COMPREHENSIVE METABOLIC PANEL
ALT: 76 U/L — ABNORMAL HIGH (ref 0–44)
AST: 69 U/L — ABNORMAL HIGH (ref 15–41)
Albumin: 2.4 g/dL — ABNORMAL LOW (ref 3.5–5.0)
Alkaline Phosphatase: 228 U/L — ABNORMAL HIGH (ref 38–126)
Anion gap: 16 — ABNORMAL HIGH (ref 5–15)
BUN: 54 mg/dL — ABNORMAL HIGH (ref 6–20)
CO2: 25 mmol/L (ref 22–32)
Calcium: 8.9 mg/dL (ref 8.9–10.3)
Chloride: 90 mmol/L — ABNORMAL LOW (ref 98–111)
Creatinine, Ser: 6.89 mg/dL — ABNORMAL HIGH (ref 0.61–1.24)
GFR, Estimated: 9 mL/min — ABNORMAL LOW (ref >60)
Glucose, Bld: 115 mg/dL — ABNORMAL HIGH (ref 70–99)
Potassium: 3.8 mmol/L (ref 3.5–5.1)
Sodium: 131 mmol/L — ABNORMAL LOW (ref 135–145)
Total Bilirubin: 1.6 mg/dL — ABNORMAL HIGH (ref 0.3–1.2)
Total Protein: 7.2 g/dL (ref 6.5–8.1)

## 2023-02-14 MED ORDER — POLYETHYLENE GLYCOL 3350 17 GM/SCOOP PO POWD
17.0000 g | Freq: Three times a day (TID) | ORAL | 0 refills | Status: AC
  Filled 2023-02-14: qty 238, 5d supply, fill #0

## 2023-02-14 MED ORDER — OXYCODONE HCL 5 MG PO TABS
5.0000 mg | ORAL_TABLET | Freq: Four times a day (QID) | ORAL | Status: DC | PRN
  Administered 2023-02-14: 5 mg via ORAL
  Filled 2023-02-14: qty 1

## 2023-02-14 MED ORDER — METRONIDAZOLE 500 MG PO TABS
500.0000 mg | ORAL_TABLET | Freq: Two times a day (BID) | ORAL | 0 refills | Status: AC
  Filled 2023-02-14: qty 4, 2d supply, fill #0

## 2023-02-14 MED ORDER — LEVOFLOXACIN 500 MG PO TABS
500.0000 mg | ORAL_TABLET | ORAL | 0 refills | Status: AC
  Filled 2023-02-14: qty 1, 2d supply, fill #0

## 2023-02-14 MED ORDER — OXYCODONE HCL 5 MG PO TABS
5.0000 mg | ORAL_TABLET | Freq: Three times a day (TID) | ORAL | 0 refills | Status: DC | PRN
  Filled 2023-02-14: qty 15, 5d supply, fill #0

## 2023-02-14 MED ORDER — SENNOSIDES-DOCUSATE SODIUM 8.6-50 MG PO TABS
2.0000 | ORAL_TABLET | Freq: Every day | ORAL | 0 refills | Status: DC
  Filled 2023-02-14: qty 15, 7d supply, fill #0

## 2023-02-14 NOTE — Hospital Course (Signed)
[facility] Patient Discharge Orders- [facility] CLINIC: east   Patient's name: Roy Randolph Admit/DC Dates: 02/11/2023 - 02/14/2023  Discharge Diagnoses: Abd pain/ fevers/ bloody ascites by CT - suspected liver necrosis s/p embolization of hepatic artery branch 2 days ago. 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     Aranesp: Given: yes   Date and amount of last dose: 02/13/23    Last Hgb: 8.9 PRBC's Given: no Date/# of units: 0 ESA dose for discharge: mircera 60 mcg IV q 2 weeks  IV Iron dose at discharge: 0  Heparin change: none  for sp s/p embolization of hepatic artery branch 2 days ago   EDW Change: yes New EDW: 62.5  [place] Change: no  Access intervention/Change: no Details:  Hectorol/Calcitriol change: no n Discharge Labs: Calcium 8.9  Phosphorus 7.2 Albumin 2.4 K+ 3.8  IV Antibiotics: no Details:  On Coumadin?: no Last INR: Next INR: Managed By:   OTHER/APPTS/LAB ORDERS:    D/C Meds to be reconciled by nurse after every discharge.  Completed By: Lenny Pastel PA-C 02/14/2023, 4:44 PM  [facility] Pager: [phone]  Reviewed by: MD:______ RN_______

## 2023-02-14 NOTE — Discharge Summary (Signed)
PATIENT DETAILS Name: Roy Randolph Age: 47 y.o. Sex: male Date of Birth: [birth year] MRN: [id number]. Admitting Physician: Jonah Blue, MD NWG:[id number], Binnie Rail, MD  Admit Date: 02/11/2023 Discharge date: 02/14/2023  Recommendations for Outpatient Follow-up:  Follow up with PCP in 1-2 weeks Please obtain CMP/CBC in one week Please ensure follow-up with interventional radiology. Repeat CT chest in 2 months-pleural nodularity-see below.  Admitted From:  Home  Disposition: Home   Discharge Condition: good  CODE STATUS:   Code Status: Full Code   Diet recommendation:  Diet Order             Diet - low sodium heart healthy           Diet renal with fluid restriction Fluid restriction: 1200 mL Fluid; Room service appropriate? Yes; Fluid consistency: Thin  Diet effective now                    Brief Summary: Patient is a 47 y.o.  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   Significant events: 5/12-5/13>> hospitalization for spontaneous acute hepatic hematoma-s/p call embolization of pseudoaneurysm hepatic artery by IR-patient signed out AMA. 5/14>> back to the ED for severe abdominal pain-thought to have post embolization syndrome/hepatic necrosis-admit to [facility].   Significant studies: 5/14>> CT angio chest/abdomen/pelvis: Recent: Ligation of pseudoaneurysm in the left lobe of liver without definite residual perforation of pseudoaneurysm, small-moderate hemorrhagic ascites.    Significant microbiology data: 5/14>> blood culture: No growth 5/14>> COVID PCR: Negative    Procedures: None   Consults: IR CCS  Brief Hospital Course: Abdominal pain-likely due to post embolization syndrome/possibly subsegmental liver necrosis Significantly better-tolerating advancement in diet-having bowel movements on a daily basis Discussed with IR team-okay for discharge-they will get patient for a follow-up, the scheduler will call the patient Empiric Levaquin/Flagyl x 1 week total Note General surgery evaluated the patient-no specific recommendations have subsequently signed off.    OSA Acknowledges being diagnosed with OSA-but never "picked up" CPAP Outpatient follow-up with PCP for sleep study/CPAP arrangement.   ESRD on HD 4x/week Noncompliant-missed HD several days this week per nephrology Nephrology followed closely Nephrology aware of discharge plans today Patient counseled regarding importance of not missing HD.   Normocytic anemia Secondary to ESRD Aranesp/iron per nephrology   HTN BP stable Continue amlodipine/clonidine/labetalol Per prior notes-poor compliance-prior history of PRES   Constipation Probably secondary to narcotics And multiple bowel movements yesterday Continue MiraLAX/senna on discharge.   Tobacco/alcohol use Counseled No withdrawal symptoms-probably out of window for any withdrawal symptoms.   Pleural nodularity posterior aspect of right hemithorax Radiology recommending follow-up contrast-enhanced CT in 2 months.   BMI: Estimated body mass index is 21.13 kg/m as calculated from the following:   Height as of this encounter: 5\' 9"  (1.753 m).   Weight as of this encounter: 64.9 kg.   Discharge Diagnoses:  Principal Problem:   Sepsis due to undetermined organism [facility])   Discharge Instructions:  Activity:  As tolerated with Full fall precautions use walker/cane & assistance as needed  Discharge Instructions     Diet - low sodium heart healthy   Complete by: As directed    Discharge instructions   Complete by:  As  directed    Follow with Primary MD  Marcine Matar, MD in 1-2 weeks  You will get a call from interventional radiology department for posthospital discharge visit follow-up.  Please resume hemodialysis at your prior schedule  Incidental finding-right pleural nodularity on your CT chest-please ask your primary care practitioner or your primary nephrologist to repeat CT chest in 2 months.  In some cases these can turn cancerous.  Please get a complete blood count and chemistry panel checked by your Primary MD at your next visit, and again as instructed by your Primary MD.  Get Medicines reviewed and adjusted: Please take all your medications with you for your next visit with your Primary MD  Laboratory/radiological data: Please request your Primary MD to go over all hospital tests and procedure/radiological results at the follow up, please ask your Primary MD to get all Hospital records sent to his/her office.  In some cases, they will be blood work, cultures and biopsy results pending at the time of your discharge. Please request that your primary care M.D. follows up on these results.  Also Note the following: If you experience worsening of your admission symptoms, develop shortness of breath, life threatening emergency, suicidal or homicidal thoughts you must seek medical attention immediately by calling 911 or calling your MD immediately  if symptoms less severe.  You must read complete instructions/literature along with all the possible adverse reactions/side effects for all the Medicines you take and that have been prescribed to you. Take any new Medicines after you have completely understood and accpet all the possible adverse reactions/side effects.   Do not drive when taking Pain medications or sleeping medications (Benzodaizepines)  Do not take more than prescribed Pain, Sleep and Anxiety Medications. It is not advisable to combine anxiety,sleep and pain medications without  talking with your primary care practitioner  Special Instructions: If you have smoked or chewed Tobacco  in the last 2 yrs please stop smoking, stop any regular Alcohol  and or any Recreational drug use.  Wear Seat belts while driving.  Please note: You were cared for by a hospitalist during your hospital stay. Once you are discharged, your primary care physician will handle any further medical issues. Please note that NO REFILLS for any discharge medications will be authorized once you are discharged, as it is imperative that you return to your primary care physician (or establish a relationship with a primary care physician if you do not have one) for your post hospital discharge needs so that they can reassess your need for medications and monitor your lab values.   Increase activity slowly   Complete by: As directed       Allergies as of 02/14/2023       Reactions   Cucumber Extract Anaphylaxis, Itching   Other Anaphylaxis, Itching, Swelling, Other (See Comments)   The patient CANNOT eat raw/fresh fruit or vegetables. Reaction starts with ears itching, then progresses from there. He can eat canned versions of these foods, however.   Tomato (diagnostic) Anaphylaxis, Swelling   Wild Lettuce [wild Lettuce Extract (lactuca Virosa)] Anaphylaxis, Swelling   Hydralazine Hives, Itching, Other (See Comments)   SEVERE ITCHING        Medication List     STOP taking these medications    sertraline 50 MG tablet Commonly known as: Zoloft       TAKE these medications    acetaminophen 325 MG tablet Commonly known as: TYLENOL Take 2 tablets (650 mg total)  by mouth every 6 (six) hours as needed for fever or mild pain.   amLODipine 10 MG tablet Commonly known as: NORVASC Take 1 tablet (10 mg total) by mouth daily.   calcium acetate 667 MG capsule Commonly known as: PHOSLO Take 1 capsule (667 mg total) by mouth 3 (three) times daily with meals.   cloNIDine 0.3 MG tablet Commonly  known as: CATAPRES Take 1 tablet (0.3 mg total) by mouth 3 (three) times daily.   Darbepoetin Alfa 300 MCG/0.6ML Sosy injection Commonly known as: ARANESP Inject 0.6 mLs (300 mcg total) into the vein every dialysis (Tu,Th,Sat).   labetalol 200 MG tablet Commonly known as: NORMODYNE Take 2 tablets (400 mg total) by mouth 2 (two) times daily.   levofloxacin 500 MG tablet Commonly known as: Levaquin Take 1.5 tablets (750 mg total) by mouth every other day for 1 day. Start taking on: Feb 16, 2023   metroNIDAZOLE 500 MG tablet Commonly known as: Flagyl Take 1 tablet (500 mg total) by mouth 2 (two) times daily for 2 days.   mirtazapine 7.5 MG tablet Commonly known as: REMERON Take 1 tablet (7.5 mg total) by mouth at bedtime. Start after weaned off Zoloft.   multivitamin Tabs tablet Take 1 tablet by mouth daily.   NIFEdipine 30 MG 24 hr tablet Commonly known as: ADALAT CC Take 30 mg by mouth every 12 (twelve) hours.   oxyCODONE 5 MG immediate release tablet Commonly known as: Oxy IR/ROXICODONE Take 1 tablet (5 mg total) by mouth every 8 (eight) hours as needed for moderate pain.   pantoprazole 40 MG tablet Commonly known as: PROTONIX Take 1 tablet (40 mg total) by mouth daily.   polyethylene glycol 17 g packet Commonly known as: MIRALAX / GLYCOLAX Take 17 g by mouth 3 (three) times daily.   Renvela 800 MG tablet Generic drug: sevelamer carbonate Take 800 mg by mouth 3 (three) times daily.   senna-docusate 8.6-50 MG tablet Commonly known as: Senokot-S Take 2 tablets by mouth at bedtime.   torsemide 100 MG tablet Commonly known as: DEMADEX Take 100 mg by mouth daily. Take after dialysis on dialysis days   Vitamin D (Ergocalciferol) 1.25 MG (50000 UNIT) Caps capsule Commonly known as: DRISDOL Take 1 capsule (50,000 Units total) by mouth once a week.        Follow-up Information     Marcine Matar, MD. Schedule an appointment as soon as possible for a visit in  1 week(s).   Specialty: Internal Medicine Contact information: [address] [place] [address] [phone]                Allergies  Allergen Reactions   Cucumber Extract Anaphylaxis and Itching   Other Anaphylaxis, Itching, Swelling and Other (See Comments)    The patient CANNOT eat raw/fresh fruit or vegetables. Reaction starts with ears itching, then progresses from there. He can eat canned versions of these foods, however.   Tomato (Diagnostic) Anaphylaxis and Swelling   Wild Lettuce [Wild Lettuce Extract (Lactuca Virosa)] Anaphylaxis and Swelling   Hydralazine Hives, Itching and Other (See Comments)    SEVERE ITCHING     Other Procedures/Studies: CT Angio Chest/Abd/Pel for Dissection W and/or Wo Contrast  Result Date: 02/11/2023 CLINICAL DATA:  47 year old male with history of worsening pain, fever and hypoxia. Recent history of hepatic arteriovenous malformation coil procedure. EXAM: CT ANGIOGRAPHY CHEST, ABDOMEN AND PELVIS TECHNIQUE: Non-contrast CT of the chest was initially obtained. Multidetector CT imaging through  the chest, abdomen and pelvis was performed using the standard protocol during bolus administration of intravenous contrast. Multiplanar reconstructed images and MIPs were obtained and reviewed to evaluate the vascular anatomy. RADIATION DOSE REDUCTION: This exam was performed according to the departmental dose-optimization program which includes automated exposure control, adjustment of the mA and/or kV according to patient size and/or use of iterative reconstruction technique. CONTRAST:  60mL OMNIPAQUE IOHEXOL 350 MG/ML SOLN COMPARISON:  CTA of the chest, abdomen and pelvis 02/09/2023. FINDINGS: CTA CHEST FINDINGS Cardiovascular: Heart size is mildly enlarged. There is no significant pericardial fluid, thickening or pericardial calcification. No acute abnormality of the thoracic aorta or the great vessels of the mediastinum. Ascending thoracic  aorta, mid aortic arch and descending thoracic aorta measure 3.2 cm, 3.0 cm and 2.3 cm in diameter respectively. No evidence of thoracic aortic dissection. Mediastinum/Nodes: Multiple prominent borderline enlarged mediastinal and bilateral hilar lymph nodes measuring up to 1.4 cm in short axis in the subcarinal nodal station, nonspecific, but similar to the prior study, and favored to be benign. Esophagus is unremarkable in appearance. No axillary lymphadenopathy. Lungs/Pleura: Dependent opacities are noted in the lungs bilaterally (left-greater-than-right), which appear predominantly atelectatic, although less well-defined areas of ground-glass attenuation and airspace consolidation are noted in the extreme lung bases, which may suggest areas of infectious consolidation and inflammation. No pleural effusions. Pleural nodularity is noted in the right hemithorax, best appreciated on axial images 48 and 49 of series 4, where the largest pleural nodule measures 1.7 x 1.6 cm (40 HU). No left-sided pleural nodularity is noted. Mild diffuse bronchial wall thickening with mild centrilobular and paraseptal emphysema. No definite suspicious appearing pulmonary nodules or masses are noted. Musculoskeletal: There are no aggressive appearing lytic or blastic lesions noted in the visualized portions of the skeleton. Review of the MIP images confirms the above findings. CTA ABDOMEN AND PELVIS FINDINGS VASCULAR Aorta: Aortic atherosclerosis. Normal caliber aorta without aneurysm, dissection, vasculitis or significant stenosis. Celiac: Embolization coils are noted in the region of the of the left hepatic artery. Celiac axis and branches are otherwise without evidence of aneurysm, dissection, vasculitis or significant stenosis. SMA: Patent without evidence of aneurysm, dissection, vasculitis or significant stenosis. Renals: Both renal arteries are patent without evidence of aneurysm, dissection, vasculitis, fibromuscular dysplasia  or significant stenosis. IMA: Patent without evidence of aneurysm, dissection, vasculitis or significant stenosis. Inflow: Patent without evidence of significant stenosis. Mild aneurysmal dilatation of the right common iliac artery (1.7 cm in diameter). Short segment dissection of the distal left common iliac artery which propagates into the left external and internal iliac arteries over a very short distance, and is associated with mild aneurysmal dilatation of the left internal iliac artery (axial image 231 of series 4) measuring 1.1 cm in diameter, similar to the prior study. Veins: No obvious venous abnormality within the limitations of this arterial phase study. Review of the MIP images confirms the above findings. NON-VASCULAR Hepatobiliary: Mild enlargement and heterogeneous attenuation in the left lobe of the liver predominantly in segment 3 related to prior intrahepatic hemorrhage and subsequent embolization. Perfusion throughout this region appears slightly diminished, likely secondary to embolization of branches of the left hepatic artery, without definitive evidence of persistent perfusion of the previously embolized pseudoaneurysm. No well organized fluid collection is confidently identified at this time. Liver is otherwise diffusely enlarged, but no focal hepatic lesions are noted. No intra or extrahepatic biliary ductal dilatation. High attenuation material within the lumen of the gallbladder is compatible with vicarious  excretion of contrast material related to prior contrast-enhanced CT examinations and embolization procedure. Pancreas: No pancreatic mass. No pancreatic ductal dilatation. No pancreatic or peripancreatic fluid collections or inflammatory changes. Spleen: Unremarkable. Adrenals/Urinary Tract: Bilateral kidneys and adrenal glands are normal in appearance. No hydroureteronephrosis. Urinary bladder is nearly completely decompressed and otherwise unremarkable in appearance. Stomach/Bowel:  The appearance of the stomach is normal. There is no pathologic dilatation of small bowel or colon. Normal appendix. Lymphatic: No lymphadenopathy noted in the abdomen or pelvis. Reproductive: Prostate gland is unremarkable in appearance. Other: Small to moderate volume of ascites, most evident adjacent to the liver and in the low anatomic pelvis. This is intermediate to high attenuation, indicative of internal blood products. Musculoskeletal: There are no aggressive appearing lytic or blastic lesions noted in the visualized portions of the skeleton. Review of the MIP images confirms the above findings. IMPRESSION: 1. 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 2. Small to moderate volume of intermediate to high attenuation ascites in the peritoneal cavity reflecting the presence of blood products within the ascites. No well organized fluid collection identified to suggest abscess in the abdomen or pelvis. 3. Dependent opacities in the lung bases predominantly atelectatic, although some areas of airspace consolidation are evident, which could reflect resolving infection or sequela of aspiration. 4. Pleural nodularity in the posterior aspect of the right hemithorax. This is of uncertain etiology and significance, but the possibility of malignancy is not entirely excluded. Follow-up contrast-enhanced chest CT is recommended in 2 months to re-evaluate this finding and ensure the stability or regression. At the time of the follow-up imaging, attention to multiple borderline enlarged mediastinal and bilateral hilar lymph nodes is also suggested to ensure stability or regression. 5. Cardiomegaly. 6. Aortic atherosclerosis with mild aneurysmal dilatation of the right common iliac artery, and short segment dissection of the distal left common iliac artery which propagates into the proximal aspects of the left  external and internal iliac arteries, with mild aneurysmal dilatation of the proximal left internal iliac artery, similar to the prior study, as above. 7. Mild diffuse bronchial wall thickening with mild centrilobular and paraseptal emphysema; imaging findings suggestive of underlying COPD. Electronically Signed   By: Trudie Reed M.D.   On: 02/11/2023 06:59   DG Chest 1 View  Result Date: 02/11/2023 CLINICAL DATA:  Shortness of breath, fever and hypoxia. 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 EXAM: CHEST  1 VIEW COMPARISON:  CTA chest 02/09/2023 FINDINGS: There is mild cardiomegaly. Vascular markings are normal caliber. The lungs show chronic changes and linear platelike basilar atelectasis without appreciable infiltrates. A single surgical clip superimposes in the right upper to mid lung field. The mediastinum is normally outlined. The thoracic cage is intact.  There is overlying monitor wiring. Compare: Overall aeration seems unchanged. IMPRESSION: Chronic lung changes with no evidence of acute chest process. Mild cardiomegaly. Electronically Signed   By: Almira Bar M.D.   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 He presents for emergent angiogram and embolization. EXAM: IR EMBO ART VEN HEMORR LYMPH EXTRAV INC GUIDE ROADMAPPING; SELECTIVE VISCERAL ARTERIOGRAPHY; IR ULTRASOUND GUIDANCE VASC ACCESS RIGHT MEDICATIONS: None. ANESTHESIA/SEDATION: Moderate (conscious) sedation was employed during this procedure. A total of Versed 1 mg and Fentanyl  25 mcg was administered intravenously. Moderate Sedation Time: 83 minutes. The patient's level of consciousness and vital signs were monitored continuously by radiology nursing throughout the procedure under my direct supervision. CONTRAST:  70 mL  Isovue 300 FLUOROSCOPY: Radiation Exposure Index (as provided by the fluoroscopic device): 1,099 mGy Kerma COMPLICATIONS: None immediate. PROCEDURE: Informed consent was obtained from the patient following explanation of the procedure, risks, benefits and alternatives. The patient understands, agrees and consents for the procedure. All questions were addressed. A time out was performed prior to the initiation of the procedure. Maximal barrier sterile technique utilized including caps, mask, sterile gowns, sterile gloves, large sterile drape, hand hygiene, and Betadine prep. The right common femoral artery was interrogated with ultrasound and found to be widely patent. An image was obtained and stored for the medical record. Local anesthesia was attained by infiltration with 1% lidocaine. A small dermatotomy was made. Under real-time sonographic guidance, the vessel was punctured with a 21 gauge micropuncture needle. Using standard technique, the initial micro needle was exchanged over a 0.018 micro wire for a transitional 4 [place] micro sheath. The micro sheath was then exchanged over a 0.035 wire for a 5 French vascular sheath. A Sos Omni selective catheter was advanced over a Bentson wire and arteriography was performed. There is inadvertent catheterization of the right inferior phrenic artery with arteriogram. No evidence of abnormality. The celiac axis was catheterized and arteriography was performed. Unfortunately, the vessel is very robust and extremely down sloping. The Sos catheter points at a prominent left gastric artery. [place] is not particularly strong. A longer reverse curve catheter is necessary. Therefore, the Sos catheter was exchanged for a Simmons catheter which was advanced over the aortic bifurcation. The Simmons catheter was used to catheterize the common hepatic artery. A hepatic arteriogram is performed. Hypertrophic and highly tortuous hepatic arteries. Large pseudoaneurysm arising from  the segment 3 left hepatic artery measures grossly 5.3 x 3.3 cm. There is intravasation into an drainage through the left hepatic vein. A Cook can tot a microcatheter was advanced over a Fathom wire and used to select the left hepatic artery. Arteriography was performed. The segment 2 hepatic artery is visualized. The segment 3 hepatic artery bifurcates into a superior and inferior division. The pseudoaneurysm arises from the inferior division of the segment 3 artery. The microcatheter was further advanced into the inferior division. Arteriography again was again performed confirming the location of the arterial injury and pseudoaneurysm. The vessel measures approximately 2.7 mm. First, embolization was attempted using a Medtronic microvascular plug. Unfortunately, the plug could not be advanced due to the high tortuosity of the vessels without backing the catheter out. Therefore, the micro vascular plug was discarded. An initial attempt was made to embolize using a 4 x 15 mm Ruby low-profile detachable coil. However, the coil would not form within the artery without prolapsing into the large pseudoaneurysm sac. Therefore, therefore, this coil was recovered and set aside on the back table. Utilizing an exchange length 0.014 wire, the cantata microcatheter was exchanged for a lantern microcatheter. A 3 mm pod detachable coil was successfully deployed in the inferior division of the segment 3 artery forming the initial coil pack. Subsequent coil embolization was performed utilizing the 4 x 15 mm Ruby low-profile detachable coil, a 30 cm low-profile packing coil and a 30 cm standard packing coil. This resulted in complete embolization of the inferior and superior divisions of the segment 3 hepatic artery. Follow-up arteriography demonstrates no further opacification of  the pseudoaneurysm. The catheters were removed. Hemostasis was attained with the assistance of a Celt arterial closure device. IMPRESSION: 1. Large  arterial pseudoaneurysm arising from the inferior division of the segment 3 hepatic artery. There is associated intravasation through the hepatic parenchyma resulting in arteriovenous shunting into the left hepatic vein. 2. Successful coil embolization of the segment 3 hepatic artery with no further flow into the pseudoaneurysm. Electronically Signed   By: Malachy Moan M.D.   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 He presents for emergent angiogram and embolization. EXAM: IR EMBO ART VEN HEMORR LYMPH EXTRAV INC GUIDE ROADMAPPING; SELECTIVE VISCERAL ARTERIOGRAPHY; IR ULTRASOUND GUIDANCE VASC ACCESS RIGHT MEDICATIONS: None. ANESTHESIA/SEDATION: Moderate (conscious) sedation was employed during this procedure. A total of Versed 1 mg and Fentanyl 25 mcg was administered intravenously. Moderate Sedation Time: 83 minutes. The patient's level of consciousness and vital signs were monitored continuously by radiology nursing throughout the procedure under my direct supervision. CONTRAST:  70 mL Isovue 300 FLUOROSCOPY: Radiation Exposure Index (as provided by the fluoroscopic device): 1,099 mGy Kerma COMPLICATIONS: None immediate. PROCEDURE: Informed consent was obtained from the patient following explanation of the procedure, risks, benefits and alternatives. The patient understands, agrees and consents for the procedure. All questions were addressed. A time out was performed prior to the initiation of the procedure. Maximal barrier sterile technique utilized including caps, mask, sterile gowns, sterile gloves, large sterile drape, hand hygiene, and Betadine prep. The right common femoral artery was interrogated with ultrasound and found to be widely patent. An image was obtained and stored for the medical record. Local anesthesia was attained by infiltration with 1%  lidocaine. A small dermatotomy was made. Under real-time sonographic guidance, the vessel was punctured with a 21 gauge micropuncture needle. Using standard technique, the initial micro needle was exchanged over a 0.018 micro wire for a transitional 4 [place] micro sheath. The micro sheath was then exchanged over a 0.035 wire for a 5 French vascular sheath. A Sos Omni selective catheter was advanced over a Bentson wire and arteriography was performed. There is inadvertent catheterization of the right inferior phrenic artery with arteriogram. No evidence of abnormality. The celiac axis was catheterized and arteriography was performed. Unfortunately, the vessel is very robust and extremely down sloping. The Sos catheter points at a prominent left gastric artery. Purchase is not particularly strong. A longer reverse curve catheter is necessary. Therefore, the Sos catheter was exchanged for a Simmons catheter which was advanced over the aortic bifurcation. The Simmons catheter was used to catheterize the common hepatic artery. A hepatic arteriogram is performed. Hypertrophic and highly tortuous hepatic arteries. Large pseudoaneurysm arising from the segment 3 left hepatic artery measures grossly 5.3 x 3.3 cm. There is intravasation into an drainage through the left hepatic vein. A Cook can tot a microcatheter was advanced over a Fathom wire and used to select the left hepatic artery. Arteriography was performed. The segment 2 hepatic artery is visualized. The segment 3 hepatic artery bifurcates into a superior and inferior division. The pseudoaneurysm arises from the inferior division of the segment 3 artery. The microcatheter was further advanced into the inferior division. Arteriography again was again performed confirming the location of the arterial injury and pseudoaneurysm. The vessel measures approximately 2.7 mm. First, embolization was attempted using a Medtronic microvascular plug. Unfortunately, the plug could  not be advanced due to the high  tortuosity of the vessels without backing the catheter out. Therefore, the micro vascular plug was discarded. An initial attempt was made to embolize using a 4 x 15 mm Ruby low-profile detachable coil. However, the coil would not form within the artery without prolapsing into the large pseudoaneurysm sac. Therefore, therefore, this coil was recovered and set aside on the back table. Utilizing an exchange length 0.014 wire, the cantata microcatheter was exchanged for a lantern microcatheter. A 3 mm pod detachable coil was successfully deployed in the inferior division of the segment 3 artery forming the initial coil pack. Subsequent coil embolization was performed utilizing the 4 x 15 mm Ruby low-profile detachable coil, a 30 cm low-profile packing coil and a 30 cm standard packing coil. This resulted in complete embolization of the inferior and superior divisions of the segment 3 hepatic artery. Follow-up arteriography demonstrates no further opacification of the pseudoaneurysm. The catheters were removed. Hemostasis was attained with the assistance of a Celt arterial closure device. IMPRESSION: 1. Large arterial pseudoaneurysm arising from the inferior division of the segment 3 hepatic artery. There is associated intravasation through the hepatic parenchyma resulting in arteriovenous shunting into the left hepatic vein. 2. Successful coil embolization of the segment 3 hepatic artery with no further flow into the pseudoaneurysm. Electronically Signed   By: Malachy Moan M.D.   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 He presents for emergent angiogram and embolization. EXAM: IR EMBO ART VEN HEMORR LYMPH EXTRAV INC GUIDE ROADMAPPING; SELECTIVE VISCERAL ARTERIOGRAPHY; IR ULTRASOUND GUIDANCE VASC ACCESS RIGHT MEDICATIONS: None.  ANESTHESIA/SEDATION: Moderate (conscious) sedation was employed during this procedure. A total of Versed 1 mg and Fentanyl 25 mcg was administered intravenously. Moderate Sedation Time: 83 minutes. The patient's level of consciousness and vital signs were monitored continuously by radiology nursing throughout the procedure under my direct supervision. CONTRAST:  70 mL Isovue 300 FLUOROSCOPY: Radiation Exposure Index (as provided by the fluoroscopic device): 1,099 mGy Kerma COMPLICATIONS: None immediate. PROCEDURE: Informed consent was obtained from the patient following explanation of the procedure, risks, benefits and alternatives. The patient understands, agrees and consents for the procedure. All questions were addressed. A time out was performed prior to the initiation of the procedure. Maximal barrier sterile technique utilized including caps, mask, sterile gowns, sterile gloves, large sterile drape, hand hygiene, and Betadine prep. The right common femoral artery was interrogated with ultrasound and found to be widely patent. An image was obtained and stored for the medical record. Local anesthesia was attained by infiltration with 1% lidocaine. A small dermatotomy was made. Under real-time sonographic guidance, the vessel was punctured with a 21 gauge micropuncture needle. Using standard technique, the initial micro needle was exchanged over a 0.018 micro wire for a transitional 4 [place] micro sheath. The micro sheath was then exchanged over a 0.035 wire for a 5 French vascular sheath. A Sos Omni selective catheter was advanced over a Bentson wire and arteriography was performed. There is inadvertent catheterization of the right inferior phrenic artery with arteriogram. No evidence of abnormality. The celiac axis was catheterized and arteriography was performed. Unfortunately, the vessel is very robust and extremely down sloping. The Sos catheter points at a prominent left gastric artery. Purchase is not  particularly strong. A longer reverse curve catheter is necessary. Therefore, the Sos catheter was exchanged for a Simmons catheter which was advanced over the aortic bifurcation. The [facility] catheter  was used to catheterize the common hepatic artery. A hepatic arteriogram is performed. Hypertrophic and highly tortuous hepatic arteries. Large pseudoaneurysm arising from the segment 3 left hepatic artery measures grossly 5.3 x 3.3 cm. There is intravasation into an drainage through the left hepatic vein. A Cook can tot a microcatheter was advanced over a Fathom wire and used to select the left hepatic artery. Arteriography was performed. The segment 2 hepatic artery is visualized. The segment 3 hepatic artery bifurcates into a superior and inferior division. The pseudoaneurysm arises from the inferior division of the segment 3 artery. The microcatheter was further advanced into the inferior division. Arteriography again was again performed confirming the location of the arterial injury and pseudoaneurysm. The vessel measures approximately 2.7 mm. First, embolization was attempted using a Medtronic microvascular plug. Unfortunately, the plug could not be advanced due to the high tortuosity of the vessels without backing the catheter out. Therefore, the micro vascular plug was discarded. An initial attempt was made to embolize using a 4 x 15 mm Ruby low-profile detachable coil. However, the coil would not form within the artery without prolapsing into the large pseudoaneurysm sac. Therefore, therefore, this coil was recovered and set aside on the back table. Utilizing an exchange length 0.014 wire, the cantata microcatheter was exchanged for a lantern microcatheter. A 3 mm pod detachable coil was successfully deployed in the inferior division of the segment 3 artery forming the initial coil pack. Subsequent coil embolization was performed utilizing the 4 x 15 mm Ruby low-profile detachable coil, a 30 cm low-profile  packing coil and a 30 cm standard packing coil. This resulted in complete embolization of the inferior and superior divisions of the segment 3 hepatic artery. Follow-up arteriography demonstrates no further opacification of the pseudoaneurysm. The catheters were removed. Hemostasis was attained with the assistance of a Celt arterial closure device. IMPRESSION: 1. Large arterial pseudoaneurysm arising from the inferior division of the segment 3 hepatic artery. There is associated intravasation through the hepatic parenchyma resulting in arteriovenous shunting into the left hepatic vein. 2. Successful coil embolization of the segment 3 hepatic artery with no further flow into the pseudoaneurysm. Electronically Signed   By: Malachy Moan M.D.   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 He presents for emergent angiogram and embolization. EXAM: IR EMBO ART VEN HEMORR LYMPH EXTRAV INC GUIDE ROADMAPPING; SELECTIVE VISCERAL ARTERIOGRAPHY; IR ULTRASOUND GUIDANCE VASC ACCESS RIGHT MEDICATIONS: None. ANESTHESIA/SEDATION: Moderate (conscious) sedation was employed during this procedure. A total of Versed 1 mg and Fentanyl 25 mcg was administered intravenously. Moderate Sedation Time: 83 minutes. The patient's level of consciousness and vital signs were monitored continuously by radiology nursing throughout the procedure under my direct supervision. CONTRAST:  70 mL Isovue 300 FLUOROSCOPY: Radiation Exposure Index (as provided by the fluoroscopic device): 1,099 mGy Kerma COMPLICATIONS: None immediate. PROCEDURE: Informed consent was obtained from the patient following explanation of the procedure, risks, benefits and alternatives. The patient understands, agrees and consents for the procedure. All questions were addressed. A time out was performed prior to the initiation of  the procedure. Maximal barrier sterile technique utilized including caps, mask, sterile gowns, sterile gloves, large sterile drape, hand hygiene, and Betadine prep. The right common femoral artery was interrogated with ultrasound and found to be widely patent. An image was obtained and stored for the medical record. Local anesthesia was attained by infiltration with 1%  lidocaine. A small dermatotomy was made. Under real-time sonographic guidance, the vessel was punctured with a 21 gauge micropuncture needle. Using standard technique, the initial micro needle was exchanged over a 0.018 micro wire for a transitional 4 [place] micro sheath. The micro sheath was then exchanged over a 0.035 wire for a 5 French vascular sheath. A Sos Omni selective catheter was advanced over a Bentson wire and arteriography was performed. There is inadvertent catheterization of the right inferior phrenic artery with arteriogram. No evidence of abnormality. The celiac axis was catheterized and arteriography was performed. Unfortunately, the vessel is very robust and extremely down sloping. The Sos catheter points at a prominent left gastric artery. [place] is not particularly strong. A longer reverse curve catheter is necessary. Therefore, the Sos catheter was exchanged for a Simmons catheter which was advanced over the aortic bifurcation. The Simmons catheter was used to catheterize the common hepatic artery. A hepatic arteriogram is performed. Hypertrophic and highly tortuous hepatic arteries. Large pseudoaneurysm arising from the segment 3 left hepatic artery measures grossly 5.3 x 3.3 cm. There is intravasation into an drainage through the left hepatic vein. A Cook can tot a microcatheter was advanced over a Fathom wire and used to select the left hepatic artery. Arteriography was performed. The segment 2 hepatic artery is visualized. The segment 3 hepatic artery bifurcates into a superior and inferior division. The pseudoaneurysm  arises from the inferior division of the segment 3 artery. The microcatheter was further advanced into the inferior division. Arteriography again was again performed confirming the location of the arterial injury and pseudoaneurysm. The vessel measures approximately 2.7 mm. First, embolization was attempted using a Medtronic microvascular plug. Unfortunately, the plug could not be advanced due to the high tortuosity of the vessels without backing the catheter out. Therefore, the micro vascular plug was discarded. An initial attempt was made to embolize using a 4 x 15 mm Ruby low-profile detachable coil. However, the coil would not form within the artery without prolapsing into the large pseudoaneurysm sac. Therefore, therefore, this coil was recovered and set aside on the back table. Utilizing an exchange length 0.014 wire, the cantata microcatheter was exchanged for a lantern microcatheter. A 3 mm pod detachable coil was successfully deployed in the inferior division of the segment 3 artery forming the initial coil pack. Subsequent coil embolization was performed utilizing the 4 x 15 mm Ruby low-profile detachable coil, a 30 cm low-profile packing coil and a 30 cm standard packing coil. This resulted in complete embolization of the inferior and superior divisions of the segment 3 hepatic artery. Follow-up arteriography demonstrates no further opacification of the pseudoaneurysm. The catheters were removed. Hemostasis was attained with the assistance of a Celt arterial closure device. IMPRESSION: 1. Large arterial pseudoaneurysm arising from the inferior division of the segment 3 hepatic artery. There is associated intravasation through the hepatic parenchyma resulting in arteriovenous shunting into the left hepatic vein. 2. Successful coil embolization of the segment 3 hepatic artery with no further flow into the pseudoaneurysm. Electronically Signed   By: Malachy Moan M.D.   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 He presents for emergent angiogram and embolization. EXAM: IR EMBO ART VEN HEMORR LYMPH EXTRAV INC GUIDE ROADMAPPING; SELECTIVE VISCERAL ARTERIOGRAPHY; IR ULTRASOUND GUIDANCE VASC ACCESS RIGHT MEDICATIONS: None. ANESTHESIA/SEDATION: Moderate (conscious) sedation was employed during this procedure. A total of Versed 1 mg and  Fentanyl 25 mcg was administered intravenously. Moderate Sedation Time: 83 minutes. The patient's level of consciousness and vital signs were monitored continuously by radiology nursing throughout the procedure under my direct supervision. CONTRAST:  70 mL Isovue 300 FLUOROSCOPY: Radiation Exposure Index (as provided by the fluoroscopic device): 1,099 mGy Kerma COMPLICATIONS: None immediate. PROCEDURE: Informed consent was obtained from the patient following explanation of the procedure, risks, benefits and alternatives. The patient understands, agrees and consents for the procedure. All questions were addressed. A time out was performed prior to the initiation of the procedure. Maximal barrier sterile technique utilized including caps, mask, sterile gowns, sterile gloves, large sterile drape, hand hygiene, and Betadine prep. The right common femoral artery was interrogated with ultrasound and found to be widely patent. An image was obtained and stored for the medical record. Local anesthesia was attained by infiltration with 1% lidocaine. A small dermatotomy was made. Under real-time sonographic guidance, the vessel was punctured with a 21 gauge micropuncture needle. Using standard technique, the initial micro needle was exchanged over a 0.018 micro wire for a transitional 4 [place] micro sheath. The micro sheath was then exchanged over a 0.035 wire for a 5 French vascular sheath. A Sos Omni selective catheter was advanced over a  Bentson wire and arteriography was performed. There is inadvertent catheterization of the right inferior phrenic artery with arteriogram. No evidence of abnormality. The celiac axis was catheterized and arteriography was performed. Unfortunately, the vessel is very robust and extremely down sloping. The Sos catheter points at a prominent left gastric artery. [place] is not particularly strong. A longer reverse curve catheter is necessary. Therefore, the Sos catheter was exchanged for a Simmons catheter which was advanced over the aortic bifurcation. The Simmons catheter was used to catheterize the common hepatic artery. A hepatic arteriogram is performed. Hypertrophic and highly tortuous hepatic arteries. Large pseudoaneurysm arising from the segment 3 left hepatic artery measures grossly 5.3 x 3.3 cm. There is intravasation into an drainage through the left hepatic vein. A Cook can tot a microcatheter was advanced over a Fathom wire and used to select the left hepatic artery. Arteriography was performed. The segment 2 hepatic artery is visualized. The segment 3 hepatic artery bifurcates into a superior and inferior division. The pseudoaneurysm arises from the inferior division of the segment 3 artery. The microcatheter was further advanced into the inferior division. Arteriography again was again performed confirming the location of the arterial injury and pseudoaneurysm. The vessel measures approximately 2.7 mm. First, embolization was attempted using a Medtronic microvascular plug. Unfortunately, the plug could not be advanced due to the high tortuosity of the vessels without backing the catheter out. Therefore, the micro vascular plug was discarded. An initial attempt was made to embolize using a 4 x 15 mm Ruby low-profile detachable coil. However, the coil would not form within the artery without prolapsing into the large pseudoaneurysm sac. Therefore, therefore, this coil was recovered and set aside on the  back table. Utilizing an exchange length 0.014 wire, the cantata microcatheter was exchanged for a lantern microcatheter. A 3 mm pod detachable coil was successfully deployed in the inferior division of the segment 3 artery forming the initial coil pack. Subsequent coil embolization was performed utilizing the 4 x 15 mm Ruby low-profile detachable coil, a 30 cm low-profile packing coil and a 30 cm standard packing coil. This resulted in complete embolization of the inferior and superior divisions of the segment 3 hepatic artery. Follow-up arteriography demonstrates no further opacification  of the pseudoaneurysm. The catheters were removed. Hemostasis was attained with the assistance of a Celt arterial closure device. IMPRESSION: 1. Large arterial pseudoaneurysm arising from the inferior division of the segment 3 hepatic artery. There is associated intravasation through the hepatic parenchyma resulting in arteriovenous shunting into the left hepatic vein. 2. Successful coil embolization of the segment 3 hepatic artery with no further flow into the pseudoaneurysm. Electronically Signed   By: Malachy Moan M.D.   On: 02/09/2023 21:32   CT Angio Chest/Abd/Pel for Dissection W and/or Wo Contrast  Result Date: 02/09/2023 CLINICAL DATA:  Acute  aortic syndrome suspected. EXAM: CT ANGIOGRAPHY CHEST, ABDOMEN AND PELVIS TECHNIQUE: Non-contrast CT of the chest was initially obtained. Multidetector CT imaging through the chest, abdomen and pelvis was performed using the standard protocol during bolus administration of intravenous contrast. Multiplanar reconstructed images and MIPs were obtained and reviewed to evaluate the vascular anatomy. RADIATION DOSE REDUCTION: This exam was performed according to the departmental dose-optimization program which includes automated exposure control, adjustment of the mA and/or kV according to patient size and/or use of iterative reconstruction technique. CONTRAST:  75mL OMNIPAQUE  IOHEXOL 350 MG/ML SOLN COMPARISON:  MRI abdomen 12/03/2021, noncontrast CT 12/05/2022, ultrasound 02/09/2023 FINDINGS: CTA CHEST FINDINGS Cardiovascular: Noncontrast series demonstrates no intramural hematoma thoracic aorta. Contrast series demonstrates no aortic dissection or aneurysm. Mediastinum/Nodes: No axillary or supraclavicular adenopathy. No mediastinal or hilar adenopathy. No pericardial fluid. Esophagus normal. Lungs/Pleura: No suspicious pulmonary nodules. Normal pleural. Airways normal. Musculoskeletal: No aggressive osseous lesion. Review of the MIP images confirms the above findings. CTA ABDOMEN AND PELVIS FINDINGS VASCULAR Aorta: Normal caliber aorta without aneurysm, dissection, vasculitis or significant stenosis. Celiac: Patent without evidence of aneurysm, dissection, vasculitis or significant stenosis. SMA: Patent without evidence of aneurysm, dissection, vasculitis or significant stenosis. Renals: Both renal arteries are patent without evidence of aneurysm, dissection, vasculitis, fibromuscular dysplasia or significant stenosis. IMA: Patent without evidence of aneurysm, dissection, vasculitis or significant stenosis. Inflow: Patent without evidence of aneurysm, dissection, vasculitis or significant stenosis. Veins: No obvious venous abnormality within the limitations of this arterial phase study. Review of the MIP images confirms the above findings. NON-VASCULAR Hepatobiliary: There is an early enhancing vascular lesion within the lateral segment LEFT hepatic lobe (segment 2 and segment 3). Rounded vascular structure measuring 3.7 by 3.3 cm in total dimension (image 179/7). 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 No evidence of fistula on noncontrast MRI December 03, 2021 or noncontrast CT 12/05/2022. Pancreas: Pancreas is normal. No ductal dilatation. No  pancreatic inflammation. Spleen: Normal spleen Adrenals/urinary tract: Adrenal glands and kidneys are normal. The ureters and bladder normal. Stomach/Bowel: Stomach, small bowel, appendix, and cecum are normal. The colon and rectosigmoid colon are normal. Vascular/Lymphatic: No lymphadenopathy Reproductive: Prostate unremarkable Other: No free fluid. Musculoskeletal: No aggressive osseous lesion. Review of the MIP images confirms the above findings. IMPRESSION: CHEST IMPRESSION: 1. No evidence of aortic dissection or aneurysm. 2. No acute pulmonary parenchymal findings. PELVIS IMPRESSION: 1. No evidence of aortic dissection or aneurysm. 2. No acute findings in the abdomen pelvis. 3. 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 Recommend non emergent interventional radiology consultation. Dedicated ultrasound evaluation the lateral segment LEFT hepatic lobe may be valuable. Lesion appears new from MRI 12/03/2021 (noncontrast). Electronically Signed   By: Roseanne Reno  Amil Amen M.D.   On: 02/09/2023 15:42   US Abdomen Limited RUQ (LIVER/GB)  Result Date: 02/09/2023 CLINICAL DATA:  Right upper quadrant discomfort EXAM: ULTRASOUND ABDOMEN LIMITED RIGHT UPPER QUADRANT COMPARISON:  None Available. FINDINGS: Gallbladder: Mild gallbladder wall thickening measuring 4.2 mm. Mild pericholecystic fluid. No stones, sludge, or Murphy's sign. Common bile duct: Diameter: 2 mm Liver: Diffuse increased echogenicity. No focal mass. Portal vein is patent on color Doppler imaging with normal direction of blood flow towards the liver. Other: Mild ascites in this dialysis patient. The right kidney is echogenic. IMPRESSION: 1. Mild gallbladder wall thickening. Mild pericholecystic fluid is nonspecific in a patient with ascites. No stones, sludge, or Murphy's sign. 2. Diffuse increased echogenicity in the liver is nonspecific but often due to hepatic  steatosis. 3. Mild ascites in this dialysis patient. 4. The right kidney is echogenic consistent with medical renal disease. Electronically Signed   By: Gerome Sam III M.D.   On: 02/09/2023 12:10   DG Chest Portable 1 View  Result Date: 02/09/2023 CLINICAL DATA:  Chest pain EXAM: PORTABLE CHEST 1 VIEW COMPARISON:  Feb 04, 2023 FINDINGS: Stable cardiomegaly. Hila and mediastinum are normal. No pneumothorax. Mild bibasilar opacities. IMPRESSION: Mild bibasilar opacities could represent early infiltrate. Developing asymmetric edema is a possibility. Recommend clinical correlation. Electronically Signed   By: Gerome Sam III M.D.   On: 02/09/2023 10:57   [place] ASCITES (ABDOMEN LIMITED)  Result Date: 01/28/2023 CLINICAL DATA:  Evaluate for ascites. EXAM: LIMITED ABDOMEN ULTRASOUND FOR ASCITES TECHNIQUE: Limited ultrasound survey for ascites was performed in all four abdominal quadrants. COMPARISON:  CT abdomen pelvis 12/05/2022 FINDINGS: Small volume ascites demonstrated within the right upper quadrant, left upper quadrant, left lower and right lower quadrant. IMPRESSION: Small volume ascites. Electronically Signed   By: Annia Belt M.D.   On: 01/28/2023 10:25     TODAY-DAY OF DISCHARGE:  Subjective:   Roy Randolph today has no headache,no chest abdominal pain,no new weakness tingling or numbness, feels much better wants to go home today.   Objective:   Blood pressure (!) 146/91, pulse 80, temperature 99.8 F (37.7 C), temperature source Oral, resp. rate 15, height 5\' 9"  (1.753 m), weight 62.4 kg, SpO2 95 %.  Intake/Output Summary (Last 24 hours) at 02/14/2023 0858 Last data filed at 02/14/2023 0248 Gross per 24 hour  Intake 820 ml  Output 2500 ml  Net -1680 ml   Filed Weights   02/11/23 2259 02/13/23 0730 02/13/23 1143  Weight: 64.3 kg 64.9 kg 62.4 kg    Exam: Awake Alert, Oriented *3, No new F.N deficits, Normal affect [place].AT,PERRAL Supple Neck,No JVD, No cervical  lymphadenopathy appriciated.  Symmetrical Chest wall movement, Good air movement bilaterally, CTAB RRR,No Gallops,Rubs or new Murmurs, No Parasternal Heave +ve B.Sounds, Abd Soft, Non tender, No organomegaly appriciated, No rebound -guarding or rigidity. No Cyanosis, Clubbing or edema, No new Rash or bruise   PERTINENT RADIOLOGIC STUDIES: No results found.   PERTINENT LAB RESULTS: CBC: Recent Labs    02/13/23 0417 02/14/23 0638  WBC 12.2* 14.3*  HGB 9.2* 8.9*  HCT 27.2* 26.6*  PLT 168 187   CMET CMP     Component Value Date/Time   NA 131 (L) 02/14/2023 0638   NA 133 (L) 01/18/2022 1146   K 3.8 02/14/2023 0638   CL 90 (L) 02/14/2023 0638   CO2 25 02/14/2023 0638   GLUCOSE 115 (H) 02/14/2023 0638   BUN 54 (H) 02/14/2023 0638   BUN 26 (H)  01/18/2022 1146   CREATININE 6.89 (H) 02/14/2023 0638   CALCIUM 8.9 02/14/2023 0638   PROT 7.2 02/14/2023 0638   ALBUMIN 2.4 (L) 02/14/2023 0638   AST 69 (H) 02/14/2023 0638   ALT 76 (H) 02/14/2023 0638   ALKPHOS 228 (H) 02/14/2023 0638   BILITOT 1.6 (H) 02/14/2023 0638   GFRNONAA 9 (L) 02/14/2023 0638    GFR Estimated Creatinine Clearance: 11.8 mL/min (A) (by C-G formula based on SCr of 6.89 mg/dL (H)). No results for input(s): "LIPASE", "AMYLASE" in the last 72 hours. No results for input(s): "CKTOTAL", "CKMB", "CKMBINDEX", "TROPONINI" in the last 72 hours. Invalid input(s): "POCBNP" No results for input(s): "DDIMER" in the last 72 hours. No results for input(s): "HGBA1C" in the last 72 hours. No results for input(s): "CHOL", "HDL", "LDLCALC", "TRIG", "CHOLHDL", "LDLDIRECT" in the last 72 hours. No results for input(s): "TSH", "T4TOTAL", "T3FREE", "THYROIDAB" in the last 72 hours.  Invalid input(s): "FREET3" No results for input(s): "VITAMINB12", "FOLATE", "FERRITIN", "TIBC", "IRON", "RETICCTPCT" in the last 72 hours. Coags: No results for input(s): "INR" in the last 72 hours.  Invalid input(s):  "PT"  Microbiology: Recent Results (from the past 240 hour(s))  MRSA Next Gen by PCR, Nasal     Status: Abnormal   Collection Time: 02/09/23  6:31 PM   Specimen: Nasal Mucosa; Nasal Swab  Result Value Ref Range Status   MRSA by PCR Next Gen DETECTED (A) NOT DETECTED Final    Comment: (NOTE) The GeneXpert MRSA Assay (FDA approved for NASAL specimens only), is one component of a comprehensive MRSA colonization surveillance program. It is not intended to diagnose MRSA infection nor to guide or monitor treatment for MRSA infections. Test performance is not FDA approved in patients less than 71 years old. Performed at [facility], [address], [place], [place] [address]   Culture, blood (routine x 2)     Status: None (Preliminary result)   Collection Time: 02/11/23  5:35 AM   Specimen: BLOOD  Result Value Ref Range Status   Specimen Description   Final    BLOOD BLOOD RIGHT FOREARM Performed at [facility], [address], [place], [place] [address]    Special Requests   Final    BOTTLES DRAWN AEROBIC AND ANAEROBIC Blood Culture adequate volume Performed at [facility], [address] [place], [place] [address]    Culture   Final    NO GROWTH 3 DAYS Performed at [facility], [address], [place], [place] [address]    Report Status PENDING  Incomplete  Culture, blood (routine x 2)     Status: None (Preliminary result)   Collection Time: 02/11/23  5:47 AM   Specimen: BLOOD  Result Value Ref Range Status   Specimen Description   Final    BLOOD RIGHT UPPER ARM Performed at [facility], [address] [place], [place] [address]    Special Requests   Final    BOTTLES DRAWN AEROBIC AND ANAEROBIC Blood Culture adequate volume Performed at [facility], [address], [place], [place] [address]    Culture   Final    NO GROWTH 3 DAYS Performed at [facility], [address], [place], [place] [address]    Report Status PENDING  Incomplete  SARS Coronavirus 2 by RT PCR (hospital order, performed in [facility] hospital lab) *cepheid single result test* Anterior Nasal Swab     Status: None   Collection Time:  02/11/23 10:18 AM   Specimen: Anterior Nasal Swab  Result Value Ref Range Status   SARS Coronavirus 2 by RT PCR NEGATIVE NEGATIVE Final    Comment: (NOTE) SARS-CoV-2 target nucleic acids are NOT DETECTED.  The SARS-CoV-2 RNA is generally detectable in upper and lower respiratory specimens during the acute phase of infection. The lowest concentration of SARS-CoV-2 viral copies this assay can detect is 250 copies / mL. A negative result does not preclude SARS-CoV-2 infection and should not be used as the sole basis for treatment or other patient management decisions.  A negative result may occur with improper specimen collection / handling, submission of specimen other than nasopharyngeal swab, presence of viral mutation(s) within the areas targeted by this assay, and inadequate number of viral copies (<250 copies / mL). A negative result must be combined with clinical observations, patient history, and epidemiological information.  Fact Sheet for Patients:   RoadLapTop.co.za  Fact Sheet for Healthcare Providers: http://kim-miller.com/  This test is not yet approved or  cleared by the [place] FDA and has been authorized for detection and/or diagnosis of SARS-CoV-2 by FDA under an Emergency Use Authorization (EUA).  This EUA will remain in effect (meaning this test can be used) for the duration of the COVID-19 declaration under Section 564(b)(1) of the Act, 21 U.S.C. section 360bbb-3(b)(1), unless the authorization is terminated or revoked sooner.  Performed at [facility], [address] [place], [place] [address]     FURTHER DISCHARGE INSTRUCTIONS:  Get Medicines  reviewed and adjusted: Please take all your medications with you for your next visit with your Primary MD  Laboratory/radiological data: Please request your Primary MD to go over all hospital tests and procedure/radiological results at the follow up, please ask your Primary MD to get all Hospital records sent to his/her office.  In some cases, they will be blood work, cultures and biopsy results pending at the time of your discharge. Please request that your primary care M.D. goes through all the records of your hospital data and follows up on these results.  Also Note the following: If you experience worsening of your admission symptoms, develop shortness of breath, life threatening emergency, suicidal or homicidal thoughts you must seek medical attention immediately by calling 911 or calling your MD immediately  if symptoms less severe.  You must read complete instructions/literature along with all the possible adverse reactions/side effects for all the Medicines you take and that have been prescribed to you. Take any new Medicines after you have completely understood and accpet all the possible adverse reactions/side effects.   Do not drive when taking Pain medications or sleeping medications (Benzodaizepines)  Do not take more than prescribed Pain, Sleep and Anxiety Medications. It is not advisable to combine anxiety,sleep and pain medications without talking with your primary care practitioner  Special Instructions: If you have smoked or chewed Tobacco  in the last 2 yrs please stop smoking, stop any regular Alcohol  and or any Recreational drug use.  Wear Seat belts while driving.  Please note: You were cared for by a hospitalist during your hospital stay. Once you are discharged, your primary care physician will handle any further medical issues. Please note that NO REFILLS for any discharge medications will be authorized once you are discharged, as it is imperative that you return to  your primary care physician (or establish a relationship with a primary care physician if you do not have one) for your post hospital discharge needs so  that they can reassess your need for medications and monitor your lab values.  Total Time spent coordinating discharge including counseling, education and face to face time equals greater than 30 minutes.  SignedJeoffrey Massed 02/14/2023 8:58 AM

## 2023-02-14 NOTE — TOC Transition Note (Signed)
Transition of Care [facility]) - CM/SW Discharge Note   Patient Details  Name: Roy Randolph MRN: [id number] Date of Birth: 1976/05/30  Transition of Care [facility]) CM/SW Contact:  Gordy Clement, RN Phone Number: 02/14/2023, 9:22 AM   Clinical Narrative:    Patient to dc to home today.  Sister will transport   No additional TOC needs      Barriers to Discharge: Continued Medical Work up   Patient Goals and CMS Choice      Discharge Placement                         Discharge Plan and Services Additional resources added to the After Visit Summary for     Discharge Planning Services: CM Consult                                 Social Determinants of Health (SDOH) Interventions SDOH Screenings   Food Insecurity: No Food Insecurity (02/09/2023)  Housing: Low Risk  (02/09/2023)  Transportation Needs: No Transportation Needs (02/09/2023)  Utilities: Not At Risk (02/09/2023)  Depression (PHQ2-9): High Risk (12/12/2022)  Tobacco Use: High Risk (02/11/2023)     Readmission Risk Interventions    12/28/2021    1:23 PM  Readmission Risk Prevention Plan  Transportation Screening Complete  PCP or Specialist Appt within 5-7 Days Complete  Home Care Screening Complete  Medication Review (RN CM) Complete

## 2023-02-14 NOTE — Progress Notes (Signed)
D/C order noted. Contacted [facility] to advise clinic of pt's d/c today and that pt should resume care tomorrow.   Kylin Dubs Renal Navigator [phone] 

## 2023-02-14 NOTE — Progress Notes (Addendum)
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     Aranesp: Given: yes   Date and amount of last dose: 02/13/23    Last Hgb: 8.9 PRBC's Given: no Date/# of units: 0 ESA dose for discharge: mircera 60 mcg IV q 2 weeks  IV Iron dose at discharge: 0  Heparin change: none  for sp s/p embolization of hepatic artery branch 2 days ago   EDW Change: yes New EDW: 11  [place] Change: no  Access intervention/Change: no

## 2023-02-15 LAB — CULTURE, BLOOD (ROUTINE X 2)

## 2023-02-16 ENCOUNTER — Telehealth: Payer: Self-pay | Admitting: Nephrology

## 2023-02-16 LAB — CULTURE, BLOOD (ROUTINE X 2)
Culture: NO GROWTH
Special Requests: ADEQUATE
Special Requests: ADEQUATE

## 2023-02-16 NOTE — Telephone Encounter (Signed)
Transition of Care - Initial Contact from Inpatient Facility  Date of discharge: 02/14/24 Date of contact: 02/16/23  Method: Phone Spoke to: Patient  Patient contacted to discuss transition of care from recent inpatient hospitalization. Patient was admitted to [facility] from .5/14-5/17/24.. 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 ...  The discharge medication list was reviewed. Patient understands the changes and has no concerns.   Patient will return to his/her outpatient HD unit on: Mtts  at [facility] unit   No other concerns at this time.

## 2023-02-17 ENCOUNTER — Other Ambulatory Visit: Payer: Self-pay | Admitting: Interventional Radiology

## 2023-02-17 ENCOUNTER — Telehealth: Payer: Self-pay

## 2023-02-17 DIAGNOSIS — N186 End stage renal disease: Secondary | ICD-10-CM

## 2023-02-17 DIAGNOSIS — F321 Major depressive disorder, single episode, moderate: Secondary | ICD-10-CM

## 2023-02-17 DIAGNOSIS — K2970 Gastritis, unspecified, without bleeding: Secondary | ICD-10-CM

## 2023-02-17 DIAGNOSIS — I161 Hypertensive emergency: Secondary | ICD-10-CM

## 2023-02-17 MED ORDER — VITAMIN D (ERGOCALCIFEROL) 1.25 MG (50000 UNIT) PO CAPS: 50000.0000 [IU] | ORAL_CAPSULE | ORAL | 0 refills | Status: DC

## 2023-02-17 MED ORDER — PANTOPRAZOLE SODIUM 40 MG PO TBEC
40.0000 mg | DELAYED_RELEASE_TABLET | Freq: Every day | ORAL | 0 refills | Status: AC
Start: 2023-02-17 — End: ?

## 2023-02-17 MED ORDER — MIRTAZAPINE 7.5 MG PO TABS
7.5000 mg | ORAL_TABLET | Freq: Every day | ORAL | 1 refills | Status: DC
Start: 2023-02-17 — End: 2023-02-18

## 2023-02-17 NOTE — Telephone Encounter (Signed)
The nifedipine and Renvela are patient reported medications. I don't know if he's supposed to be taking Phoslo and the Renvela. The nifedipine looks like it was discontinued by the hospital team but I cannot find a reason. We may need to see him, however, I will route information to Dr. Laural Benes for her to review. I sent refills for the pantoprazole, mirtazapine, and vitamin D

## 2023-02-17 NOTE — Transitions of Care (Post Inpatient/ED Visit) (Signed)
02/17/2023  Name: Roy Randolph MRN: [id number] DOB: [birth year]  Today's TOC FU Call Status: Today's TOC FU Call Status:: Successful TOC FU Call Competed TOC FU Call Complete Date: 02/17/23  Transition Care Management Follow-up Telephone Call Date of Discharge: 02/14/23 Discharge Facility: [facility]) Type of Discharge: Inpatient Admission Primary Inpatient Discharge Diagnosis:: sepsis How have you been since you were released from the hospital?: Same (He said he still is in alot of pain but the doctors told him that he would be in pain for a couple of weeks. He was at dialysis at the time of this call and said he has just been laying down and resting.) Any questions or concerns?: Yes Patient Questions/Concerns:: He explained that he needs a sleep study because he never had one.  He also said that he was referred to ENT but they sent him a letter stating he needs to pay $200 up front. Patient Questions/Concerns Addressed: Notified Provider of Patient Questions/Concerns (message sent to Cheryll Dessert, referral coordinator inquiring if there is another option for ENT providers without the up front cost.)  Items Reviewed: Did you receive and understand the discharge instructions provided?: Yes Medications obtained,verified, and reconciled?: Yes (Medications Reviewed) (Med list reviewed. He needs refills of pantoprazole, nifedipine, phoslo, mirtazapine, renvela, vitamin D) Any new allergies since your discharge?: No Dietary orders reviewed?: Yes Type of Diet Ordered:: low sodium haert healthy .  1200 ml fluid restriction. Do you have support at home?: Yes  Medications Reviewed Today: Medications Reviewed Today     Reviewed by Shiela Mayer, CPhT (Pharmacy Technician) on 02/13/23 at 1244  Med List Status: Complete   Medication Order Taking? Sig Documenting Provider Last Dose Status Informant  acetaminophen (TYLENOL) 325 MG tablet [id number] Yes Take 2 tablets (650 mg total) by  mouth every 6 (six) hours as needed for fever or mild pain. Belva Agee, MD Past Week Active Self  amLODipine (NORVASC) 10 MG tablet [id number] Yes Take 1 tablet (10 mg total) by mouth daily. Ellison Carwin, MD Past Week Active            Med Note Shiela Mayer   Thu Feb 13, 2023 12:22 PM)    calcium acetate (PHOSLO) 667 MG capsule [id number] No Take 1 capsule (667 mg total) by mouth 3 (three) times daily with meals.  Patient not taking: Reported on 02/09/2023   Dellia Cloud, MD Not Taking Active Self           Med Note Shiela Mayer   Thu Feb 13, 2023 12:42 PM)    cloNIDine (CATAPRES) 0.3 MG tablet [id number] Yes Take 1 tablet (0.3 mg total) by mouth 3 (three) times daily. Jacalyn Lefevre, MD Past Week Active            Med Note Shiela Mayer   Thu Feb 13, 2023 12:20 PM)    Darbepoetin Alfa (ARANESP) 300 MCG/0.6ML SOSY injection [id number] No Inject 0.6 mLs (300 mcg total) into the vein every dialysis (Tu,Th,Sat).  Patient not taking: Reported on 02/13/2023   Belva Agee, MD Not Taking Active Self           Med Note August Albino Feb 13, 2023 12:42 PM)    labetalol (NORMODYNE) 200 MG tablet [id number] Yes Take 2 tablets (400 mg total) by mouth 2 (two) times daily. Ellison Carwin, MD Past Week Active            Med Note Randa Evens, [place] D  Thu Feb 13, 2023 12:21 PM)    mirtazapine (REMERON) 7.5 MG tablet [id number] Yes Take 1 tablet (7.5 mg total) by mouth at bedtime. Start after weaned off Zoloft. Marcine Matar, MD Past Week Active            Med Note August Albino Feb 13, 2023 12:20 PM)    multivitamin (RENA-VIT) TABS tablet [id number] No Take 1 tablet by mouth daily.  Patient not taking: Reported on 02/13/2023   [provider] Not Taking Active   NIFEdipine (ADALAT CC) 30 MG 24 hr tablet [id number] Yes Take 30 mg by mouth every 12 (twelve) hours. [provider] Past Week Active   pantoprazole (PROTONIX)  40 MG tablet [id number] Yes Take 1 tablet (40 mg total) by mouth daily. Ellison Carwin, MD Past Week Active            Med Note August Albino Feb 13, 2023 12:23 PM)    RENVELA 800 MG tablet [id number] Yes Take 800 mg by mouth 3 (three) times daily. [provider] Past Week Active            Med Note August Albino Feb 13, 2023 12:26 PM)    sertraline (ZOLOFT) 50 MG tablet [id number] No Take 1 tablet (50 mg total) by mouth daily.  Patient not taking: Reported on 02/09/2023   Ellison Carwin, MD Not Taking Active   torsemide [facility]) 100 MG tablet [id number] Yes Take 100 mg by mouth daily. Take after dialysis on dialysis days [provider] Past Week Active            Med Note Shiela Mayer   Thu Feb 13, 2023 12:22 PM)    Vitamin D, Ergocalciferol, (DRISDOL) 1.25 MG (50000 UNIT) CAPS capsule [id number] No Take 1 capsule (50,000 Units total) by mouth once a week.  Patient not taking: Reported on 02/09/2023   Virgina [place], [place] Not Taking Active            Med Note August Albino Feb 13, 2023 12:43 PM)              Home Care and Equipment/Supplies: Were Home Health Services Ordered?: No Any new equipment or medical supplies ordered?: No  Functional Questionnaire: Do you need assistance with bathing/showering or dressing?: No Do you need assistance with meal preparation?: No Do you need assistance with eating?: No Do you have difficulty maintaining continence: No Do you need assistance with getting out of bed/getting out of a chair/moving?: No Do you have difficulty managing or taking your medications?: No  Follow up appointments reviewed: PCP Follow-up appointment confirmed?: Yes Date of PCP follow-up appointment?: 02/28/23 Follow-up Provider: Gwinda Passe, NP.  He needs an appointment on Friday due to dialysis M/T/T/S and was willing to go to another clinic to be seen as soon as possible. Specialist Hospital Follow-up  appointment confirmed?: NA Do you need transportation to your follow-up appointment?: No Do you understand care options if your condition(s) worsen?: Yes-patient verbalized understanding    SIGNATURE  Robyne Peers, RN

## 2023-02-17 NOTE — Telephone Encounter (Signed)
From the discharge call:  He said he still is in alot of pain but the doctors told him that he would be in pain for a couple of weeks. He was at dialysis at the time of this call and said he has just been laying down and resting.  He attends dialysis: M/T/T/S/   He explained that he needs a sleep study because he never had one.  He said he never received a CPAP machine because he never had the sleep study.   He also said that he was referred to ENT but they sent him a letter stating he needs to pay $200 up front. - Arna Medici are there any other options for him?   Medications Reviewed: He needs refills of pantoprazole, nifedipine, phoslo, mirtazapine, renvela, vitamin D    follow-up appointment?: 02/28/23 Gwinda Passe, NP He needs an appointment on Friday due to dialysis M/T/T/S and was willing to go to another clinic to be seen as soon as possible.

## 2023-02-18 MED ORDER — RENVELA 800 MG PO TABS: 800.0000 mg | ORAL_TABLET | Freq: Three times a day (TID) | ORAL | 4 refills | Status: DC

## 2023-02-18 MED ORDER — MIRTAZAPINE 7.5 MG PO TABS
7.5000 mg | ORAL_TABLET | Freq: Every day | ORAL | 1 refills | Status: DC
Start: 2023-02-18 — End: 2023-04-30

## 2023-02-18 MED ORDER — NIFEDIPINE ER 30 MG PO TB24: 30.0000 mg | ORAL_TABLET | Freq: Two times a day (BID) | ORAL | 1 refills | Status: DC

## 2023-02-18 NOTE — Addendum Note (Signed)
Addended by: Jonah Blue B on: 02/18/2023 01:11 PM   Modules accepted: Orders

## 2023-02-19 ENCOUNTER — Encounter (HOSPITAL_COMMUNITY): Payer: Self-pay

## 2023-02-19 ENCOUNTER — Ambulatory Visit (HOSPITAL_COMMUNITY): Payer: Medicaid Other | Admitting: Student in an Organized Health Care Education/Training Program

## 2023-02-19 NOTE — Progress Notes (Deleted)
Psychiatric Initial Adult Assessment   Patient Identification: Roy Randolph MRN:  [id number] Date of Evaluation:  02/19/2023 Referral Source: *** Chief Complaint:  No chief complaint on file.  Visit Diagnosis: No diagnosis found.  History of Present Illness:  ***  Associated Signs/Symptoms: Depression Symptoms:  {DEPRESSION SYMPTOMS:20000} (Hypo) Manic Symptoms:  {BHH MANIC SYMPTOMS:22872} Anxiety Symptoms:  {BHH ANXIETY SYMPTOMS:22873} Psychotic Symptoms:  {BHH PSYCHOTIC SYMPTOMS:22874} PTSD Symptoms: {BHH PTSD SYMPTOMS:22875}  Past Psychiatric History: ***  Previous Psychotropic Medications: {YES/NO:21197}  Substance Abuse History in the last 12 months:  {yes no:314532}  Consequences of Substance Abuse: {BHH CONSEQUENCES OF SUBSTANCE ABUSE:22880}  Past Medical History:  Past Medical History:  Diagnosis Date   Abnormal EKG 10/10/2021   Alcohol use disorder in remission 11/14/2021   Elevated troponin 10/10/2021   ESRD on hemodialysis (HCC) 10/10/2021   History of migraine 11/14/2021   Hypertension    Hypertensive emergency due to non compliance 10/10/2021   Hypokalemia 10/10/2021   Left arm numbness and pain 10/10/2021   Marijuana use 10/10/2021   Noncompliance    NSAID long-term use 10/10/2021   PRES (posterior reversible encephalopathy syndrome)    Smoker 10/10/2021   Syncope, vasovagal 10/10/2021   Thrombocytopenia (HCC) 10/10/2021   Vision disturbance 11/14/2021    Past Surgical History:  Procedure Laterality Date   AV FISTULA PLACEMENT Left 01/01/2022   Procedure: LEFT ARM BRACHIOCEPHALIC ARTERIOVENOUS  FISTULA CREATION;  Surgeon: Maeola Harman, MD;  Location: [facility] OR;  Service: Vascular;  Laterality: Left;   IR ANGIOGRAM VISCERAL SELECTIVE  02/09/2023   IR ANGIOGRAM VISCERAL SELECTIVE  02/09/2023   IR ANGIOGRAM VISCERAL SELECTIVE  02/09/2023   IR EMBO ART  VEN HEMORR LYMPH EXTRAV  INC GUIDE ROADMAPPING  02/09/2023   IR FLUORO GUIDE CV LINE  RIGHT  12/24/2021   IR FLUORO GUIDE CV LINE RIGHT  12/27/2021   IR US GUIDE VASC ACCESS RIGHT  12/24/2021   IR US GUIDE VASC ACCESS RIGHT  02/09/2023   THORACOTOMY Right     Family Psychiatric History: ***  Family History:  Family History  Problem Relation Age of Onset   Hypertension Mother    Cervical cancer Mother    Breast cancer Sister    Sudden Cardiac Death Brother 8   Heart disease Maternal Grandmother    Cancer Maternal Grandmother     Social History:   Social History   Socioeconomic History   Marital status: Single    Spouse name: Not on file   Number of children: Not on file   Years of education: Not on file   Highest education level: Not on file  Occupational History   Not on file  Tobacco Use   Smoking status: Some Days    Packs/day: .3    Types: Cigarettes    Start date: 1998   Smokeless tobacco: Not on file   Tobacco comments:    Stopped in January  Vaping Use   Vaping Use: Never used  Substance and Sexual Activity   Alcohol use: Not Currently    Alcohol/week: 12.0 standard drinks of alcohol    Types: 12 Cans of beer per week    Comment: stopped heavy drinking 2021   Drug use: Yes    Types: Marijuana   Sexual activity: Not on file  Other Topics Concern   Not on file  Social History Narrative   Not on file   Social Determinants of Health   Financial Resource Strain: Not on file  Food Insecurity:  No Food Insecurity (02/09/2023)   Hunger Vital Sign    Worried About Running Out of Food in the Last Year: Never true    Ran Out of Food in the Last Year: Never true  Transportation Needs: No Transportation Needs (02/09/2023)   PRAPARE - Administrator, Civil Service (Medical): No    Lack of Transportation (Non-Medical): No  Physical Activity: Not on file  Stress: Not on file  Social Connections: Not on file    Additional Social History: ***  Allergies:   Allergies  Allergen Reactions   Cucumber Extract Anaphylaxis and Itching    Other Anaphylaxis, Itching, Swelling and Other (See Comments)    The patient CANNOT eat raw/fresh fruit or vegetables. Reaction starts with ears itching, then progresses from there. He can eat canned versions of these foods, however.   Tomato (Diagnostic) Anaphylaxis and Swelling   Wild Lettuce [Wild Lettuce Extract (Lactuca Virosa)] Anaphylaxis and Swelling   Hydralazine Hives, Itching and Other (See Comments)    SEVERE ITCHING    Metabolic Disorder Labs: Lab Results  Component Value Date   HGBA1C 4.7 (L) 10/10/2021   MPG 88.19 10/10/2021   No results found for: "PROLACTIN" Lab Results  Component Value Date   TRIG 370 (H) 12/02/2021   Lab Results  Component Value Date   TSH 1.002 10/10/2021    Therapeutic Level Labs: No results found for: "LITHIUM" No results found for: "CBMZ" No results found for: "VALPROATE"  Current Medications: Current Outpatient Medications  Medication Sig Dispense Refill   acetaminophen (TYLENOL) 325 MG tablet Take 2 tablets (650 mg total) by mouth every 6 (six) hours as needed for fever or mild pain. 30 tablet 0   amLODipine (NORVASC) 10 MG tablet Take 1 tablet (10 mg total) by mouth daily. 90 tablet 0   cloNIDine (CATAPRES) 0.3 MG tablet Take 1 tablet (0.3 mg total) by mouth 3 (three) times daily. 270 tablet 0   Darbepoetin Alfa (ARANESP) 300 MCG/0.6ML SOSY injection Inject 0.6 mLs (300 mcg total) into the vein every dialysis (Tu,Th,Sat). (Patient not taking: Reported on 02/13/2023) 1.68 mL 0   labetalol (NORMODYNE) 200 MG tablet Take 2 tablets (400 mg total) by mouth 2 (two) times daily. 240 tablet 0   mirtazapine (REMERON) 7.5 MG tablet Take 1 tablet (7.5 mg total) by mouth at bedtime. Start after weaned off Zoloft. 30 tablet 1   multivitamin (RENA-VIT) TABS tablet Take 1 tablet by mouth daily. (Patient not taking: Reported on 02/13/2023)     NIFEdipine (ADALAT CC) 30 MG 24 hr tablet Take 1 tablet (30 mg total) by mouth every 12 (twelve) hours. 180  tablet 1   oxyCODONE (OXY IR/ROXICODONE) 5 MG immediate release tablet Take 1 tablet (5 mg total) by mouth every 8 (eight) hours as needed for moderate pain. 15 tablet 0   pantoprazole (PROTONIX) 40 MG tablet Take 1 tablet (40 mg total) by mouth daily. 90 tablet 0   polyethylene glycol powder (GLYCOLAX/MIRALAX) 17 GM/SCOOP powder Take 17 g by mouth 3 (three) times daily. 238 g 0   RENVELA 800 MG tablet Take 1 tablet (800 mg total) by mouth 3 (three) times daily. 90 tablet 4   senna-docusate (SENOKOT-S) 8.6-50 MG tablet Take 2 tablets by mouth at bedtime. 15 tablet 0   torsemide (DEMADEX) 100 MG tablet Take 100 mg by mouth daily. Take after dialysis on dialysis days     Vitamin D, Ergocalciferol, (DRISDOL) 1.25 MG (50000 UNIT) CAPS capsule Take 1  capsule (50,000 Units total) by mouth once a week. 12 capsule 0   No current facility-administered medications for this visit.    Musculoskeletal: Strength & Muscle Tone: {desc; muscle tone:32375} Gait & Station: {PE GAIT ED MVHQ:46962} Patient leans: {Patient Leans:21022755}  Psychiatric Specialty Exam: Review of Systems  There were no vitals taken for this visit.There is no height or weight on file to calculate BMI.  General Appearance: {Appearance:22683}  Eye Contact:  {BHH EYE CONTACT:22684}  Speech:  {Speech:22685}  Volume:  {Volume (PAA):22686}  Mood:  {BHH MOOD:22306}  Affect:  {Affect (PAA):22687}  Thought Process:  {Thought Process (PAA):22688}  Orientation:  {BHH ORIENTATION (PAA):22689}  Thought Content:  {Thought Content:22690}  Suicidal Thoughts:  {ST/HT (PAA):22692}  Homicidal Thoughts:  {ST/HT (PAA):22692}  Memory:  {BHH MEMORY:22881}  Judgement:  {Judgement (PAA):22694}  Insight:  {Insight (PAA):22695}  Psychomotor Activity:  {Psychomotor (PAA):22696}  Concentration:  {Concentration:21399}  Recall:  {BHH GOOD/FAIR/POOR:22877}  Fund of Knowledge:{BHH GOOD/FAIR/POOR:22877}  Language: {BHH GOOD/FAIR/POOR:22877}  Akathisia:   {BHH YES OR NO:22294}  Handed:  {Handed:22697}  AIMS (if indicated):  {Desc; done/not:10129}  Assets:  {Assets (PAA):22698}  ADL's:  {BHH XBM'W:41324}  Cognition: {chl bhh cognition:304700322}  Sleep:  {BHH GOOD/FAIR/POOR:22877}   Screenings: GAD-7    Flowsheet Row Office Visit from 12/12/2022 in [facility] Office Visit from 12/14/2021 in [facility]  Total GAD-7 Score 14 12      PHQ2-9    Flowsheet Row Office Visit from 12/12/2022 in [facility] Office Visit from 02/15/2022 in [facility] Office Visit from 12/20/2021 in [facility] Office Visit from 12/14/2021 in [facility]  PHQ-2 Total Score 1 0 0 0  PHQ-9 Total Score 11 -- -- --      Flowsheet Row ED to Hosp-Admission (Discharged) from 02/11/2023 in [facility] ED to Hosp-Admission (Discharged) from 02/09/2023 in [place]IOVASC[facility] ICU ED from 12/05/2022 in [facility] Emergency Department at [facility]  C-SSRS RISK CATEGORY No Risk No Risk No Risk       Assessment and Plan: ***  Collaboration of Care: {BH OP Collaboration of Care:21014065}  Patient/Guardian was advised Release of Information must be obtained prior to any record release in order to collaborate their care with an outside provider. Patient/Guardian was advised if they have not already done so to contact the registration department to sign all necessary forms in order for [place] to release information regarding their care.   Consent: Patient/Guardian gives verbal consent for treatment and assignment of benefits for services provided during this visit. Patient/Guardian expressed understanding and agreed to proceed.   Lauro Franklin, MD 5/22/20247:34 AM

## 2023-02-19 NOTE — Telephone Encounter (Signed)
I called the patient and informed him of the refills that have been sent to his pharmacy.  I also informed him that Dr Laural Benes received his signed document to appeal the sleep study denial and he said he understood

## 2023-02-19 NOTE — Telephone Encounter (Signed)
The signed document from the patient has been received and given to Dr Laural Benes

## 2023-02-27 NOTE — Telephone Encounter (Signed)
Pt did not answer. Left VM.  

## 2023-02-28 ENCOUNTER — Encounter (INDEPENDENT_AMBULATORY_CARE_PROVIDER_SITE_OTHER): Payer: Self-pay | Admitting: Primary Care

## 2023-02-28 ENCOUNTER — Telehealth: Payer: Self-pay | Admitting: Internal Medicine

## 2023-02-28 ENCOUNTER — Ambulatory Visit (INDEPENDENT_AMBULATORY_CARE_PROVIDER_SITE_OTHER): Payer: Medicaid Other | Admitting: Primary Care

## 2023-02-28 VITALS — BP 151/86 | HR 82 | Resp 16 | Wt 144.8 lb

## 2023-02-28 DIAGNOSIS — N186 End stage renal disease: Secondary | ICD-10-CM

## 2023-02-28 DIAGNOSIS — I130 Hypertensive heart and chronic kidney disease with heart failure and stage 1 through stage 4 chronic kidney disease, or unspecified chronic kidney disease: Secondary | ICD-10-CM

## 2023-02-28 DIAGNOSIS — Z09 Encounter for follow-up examination after completed treatment for conditions other than malignant neoplasm: Secondary | ICD-10-CM | POA: Diagnosis not present

## 2023-02-28 DIAGNOSIS — H6123 Impacted cerumen, bilateral: Secondary | ICD-10-CM

## 2023-02-28 MED ORDER — DEBROX 6.5 % OT SOLN: 5.0000 [drp] | Freq: Two times a day (BID) | OTIC | 0 refills | Status: AC

## 2023-02-28 NOTE — Telephone Encounter (Signed)
Received call to triage from scheduler. Roy Passe, NP calling about patient stating BP 194/106 this morning during office visit. Patient had taken all of his BP medications, recheck during phone conversation was 151/86. Roy Randolph states patient is not experiencing any symptoms (no headache, SOB, CP). She is requesting appt for patient to see Roy Randolph to follow-up.  Patient overdue for follow-up (did not show for appt in December 2023). Patient had dialysis on Mondays, Tuesdays, Thursdays and Saturdays. Patient accepted appt with Roy Randolph on 03/19/23 at 10:00 AM.

## 2023-02-28 NOTE — Progress Notes (Unsigned)
[facility]   Subjective:   [name] is a 47 y.o. male presents for hospital follow up.  Admit date to the hospital was 02/11/23, patient was discharged from the hospital on 02/14/23, patient was admitted for: Sepsis due to undetermined organism . Presents with Bp 194/106 asymptomatic.  He stated he had just taken his medication prior to appointment.  Informed cardiologist of initial blood pressure and current blood pressure no concerns at this time encouraged to continue to take his medications.  He has not missed his last appointment with cardiology appointment made with Dr. Lynnette Randolph on 03/19/23 at 10:00 AM.  Made patient aware.  Patient asked how could he change providers he would like the writer to be his PCP for reasons he has given.  This is establishing care with a new PCP and hospital discharge follow-up. Patient has No headache, No chest pain, No abdominal pain - No Nausea, No new weakness tingling or numbness, No Cough - shortness of breath   Past Medical History:  Diagnosis Date   Abnormal EKG 10/10/2021   Alcohol use disorder in remission 11/14/2021   Elevated troponin 10/10/2021   ESRD on hemodialysis (HCC) 10/10/2021   History of migraine 11/14/2021   Hypertension    Hypertensive emergency due to non compliance 10/10/2021   Hypokalemia 10/10/2021   Left arm numbness and pain 10/10/2021   Marijuana use 10/10/2021   Noncompliance    NSAID long-term use 10/10/2021   PRES (posterior reversible encephalopathy syndrome)    Smoker 10/10/2021   Syncope, vasovagal 10/10/2021   Thrombocytopenia (HCC) 10/10/2021   Vision disturbance 11/14/2021     Allergies  Allergen Reactions   Cucumber Extract Anaphylaxis and Itching   Other Anaphylaxis, Itching, Swelling and Other (See Comments)    The patient CANNOT eat raw/fresh fruit or vegetables. Reaction starts with ears itching, then progresses from there. He can eat canned versions of these foods, however.    Tomato (Diagnostic) Anaphylaxis and Swelling   Wild Lettuce [Wild Lettuce Extract (Lactuca Virosa)] Anaphylaxis and Swelling   Hydralazine Hives, Itching and Other (See Comments)    SEVERE ITCHING      Current Outpatient Medications on File Prior to Visit  Medication Sig Dispense Refill   acetaminophen (TYLENOL) 325 MG tablet Take 2 tablets (650 mg total) by mouth every 6 (six) hours as needed for fever or mild pain. 30 tablet 0   amLODipine (NORVASC) 10 MG tablet Take 1 tablet (10 mg total) by mouth daily. 90 tablet 0   cloNIDine (CATAPRES) 0.3 MG tablet Take 1 tablet (0.3 mg total) by mouth 3 (three) times daily. 270 tablet 0   Darbepoetin Alfa (ARANESP) 300 MCG/0.6ML SOSY injection Inject 0.6 mLs (300 mcg total) into the vein every dialysis (Tu,Th,Sat). (Patient not taking: Reported on 02/13/2023) 1.68 mL 0   labetalol (NORMODYNE) 200 MG tablet Take 2 tablets (400 mg total) by mouth 2 (two) times daily. 240 tablet 0   mirtazapine (REMERON) 7.5 MG tablet Take 1 tablet (7.5 mg total) by mouth at bedtime. Start after weaned off Zoloft. 30 tablet 1   multivitamin (RENA-VIT) TABS tablet Take 1 tablet by mouth daily. (Patient not taking: Reported on 02/13/2023)     NIFEdipine (ADALAT CC) 30 MG 24 hr tablet Take 1 tablet (30 mg total) by mouth every 12 (twelve) hours. 180 tablet 1   oxyCODONE (OXY IR/ROXICODONE) 5 MG immediate release tablet Take 1 tablet (5 mg total) by mouth every 8 (eight) hours as needed for  moderate pain. 15 tablet 0   pantoprazole (PROTONIX) 40 MG tablet Take 1 tablet (40 mg total) by mouth daily. 90 tablet 0   polyethylene glycol powder (GLYCOLAX/MIRALAX) 17 GM/SCOOP powder Take 17 g by mouth 3 (three) times daily. 238 g 0   RENVELA 800 MG tablet Take 1 tablet (800 mg total) by mouth 3 (three) times daily. 90 tablet 4   senna-docusate (SENOKOT-S) 8.6-50 MG tablet Take 2 tablets by mouth at bedtime. 15 tablet 0   torsemide (DEMADEX) 100 MG tablet Take 100 mg by mouth daily.  Take after dialysis on dialysis days     Vitamin D, Ergocalciferol, (DRISDOL) 1.25 MG (50000 UNIT) CAPS capsule Take 1 capsule (50,000 Units total) by mouth once a week. 12 capsule 0   No current facility-administered medications on file prior to visit.   Review of System: Comprehensive ROS Pertinent positive and negative noted in HPI    Objective:  Blood Pressure (Abnormal) 194/106 (BP Location: Right Arm, Patient Position: Sitting, Cuff Size: Small)   Pulse 82   Respiration 16   Weight 144 lb 12.8 oz (65.7 kg)   Oxygen Saturation 96%   Body Mass Index 21.38 kg/m   Filed Weights   02/28/23 0920  Weight: 144 lb 12.8 oz (65.7 kg)   Physical Exam: General Appearance: Well nourished, in no apparent distress. Eyes: PERRLA, EOMs, conjunctiva no swelling or erythema Sinuses: No Frontal/maxillary tenderness ENT/Mouth: Ext aud canals clear, Right ear cerumen impaction causing decrease hearing normal.  Neck: Supple, thyroid normal.  Respiratory: Respiratory effort normal, BS equal bilaterally without rales, rhonchi, wheezing or stridor.  Cardio: RRR with no MRGs. Brisk peripheral pulses without edema.  Abdomen: Soft, + BS.  Very tender, guarding, grab writers hand when started to palpate  Lymphatics: Non tender without lymphadenopathy.  Musculoskeletal: Full ROM, 5/5 strength, normal gait.  Skin: Warm, dry without rashes, lesions, ecchymosis.  Neuro: Cranial nerves intact. Normal muscle tone, no cerebellar symptoms. Sensation intact.  Psych: Awake and oriented X 3, normal affect, Insight and Judgment appropriate.    Assessment:  Roy Randolph was seen today for hospitalization follow-up.  Diagnoses and all orders for this visit:  Hospital discharge follow-up Follow up with PCP in 1-2 weeks Please obtain CMP/CBC in one week-patient has a port will have labs drawn at dialysis He was made aware of and needed to  follow-up with interventional radiology. Repeat CT chest in 2  months-pleural nodularity-see below. 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   This note has been created with Education officer, environmental. Any transcriptional errors are unintentional.   Grayce Sessions, NP 02/28/2023, 9:54 AM

## 2023-02-28 NOTE — Telephone Encounter (Signed)
Pt c/o BP issue: STAT if pt c/o blurred vision, one-sided weakness or slurred speech  1. What are your last 5 BP readings? 194/106  2. Are you having any other symptoms (ex. Dizziness, headache, blurred vision, passed out)? No   3. What is your BP issue? Roy Randolph is calling to inform office of high BP and needs to be advised on what to do. Please advise

## 2023-03-07 ENCOUNTER — Ambulatory Visit (INDEPENDENT_AMBULATORY_CARE_PROVIDER_SITE_OTHER): Payer: Medicaid Other

## 2023-03-10 NOTE — Telephone Encounter (Signed)
Spoke to pt and rescheduled apt.

## 2023-03-12 ENCOUNTER — Telehealth: Payer: Self-pay | Admitting: Internal Medicine

## 2023-03-13 NOTE — Telephone Encounter (Signed)
Called & spoke to [place]. Informed that upon finding approval letter the date range is not listed. It only shows approval from 02/21/2023. Letter placed to be scanned into chart. Terry expressed verbal understanding.

## 2023-03-14 ENCOUNTER — Encounter (HOSPITAL_COMMUNITY): Payer: Self-pay

## 2023-03-14 ENCOUNTER — Ambulatory Visit (HOSPITAL_COMMUNITY): Payer: Medicaid Other

## 2023-03-17 NOTE — Progress Notes (Unsigned)
Cardiology Office Note:    Date:  03/17/2023   ID:  Roy Randolph, DOB [birth year], MRN [id number]  PCP:  Grayce Sessions, NP   [facility] Providers Cardiologist:  Alverda Skeans, MD Referring MD: Marcine Matar, MD   Chief Complaint/Reason for Referral:  HTN  PATIENT RESCHEDULED   ASSESSMENT:    1. Precordial pain   2. End stage kidney disease (HCC)   3. Essential hypertension   4. Anemia of chronic kidney failure, stage 5 (HCC)      PLAN:    In order of problems listed above: 1.  Chest pain: Resolved with regular dialysis.  2.  End-stage renal disease: This is being followed by other providers. 3.  Hypertension: Given very poorly controlled blood pressure will refer to pharmacy for further recommendations.  Consider referral to Dr. Duke Salvia for advanced hypertension clinic in the future if needed. 4.  Anemia of chronic disease: CBC recently was significantly abnormal.  Hemoglobin was around 9 which is acceptable.                    History of Present Illness:    FOCUSED PROBLEM LIST:   1.  End-stage renal disease on hemodialysis ([name]) 2.  Hypertension 3.  Anemia of chronic disease 4.  Ongoing tobacco abuse 5.  Prior alcohol abuse 6.  Noncompliance   May 2023: The patient is a 47 y.o. male with the indicated medical history here for emergency room follow-up.  The patient was seen recently in the emergency department due to chest pain.  At that time her blood pressure was 145/94.  Her cardiac biomarkers were mildly abnormal.  An EKG left ventricular hypertrophy with repolarization abnormalities.  A chest x-ray demonstrated mild cardiomegaly and mild central vascular congestion.  Her other laboratories were remarkable for creatinine of 7.31, hemoglobin of 9, and normal glucose level.  She was administered aspirin, as needed nitroglycerin, and clonidine.    When questioned about this episode further apparently this happened on Sunday night  into Monday.  This was during his birthday weekend when the patient ingested more fluid than he normally does.  Once he started dialysis on Tuesday following his presentation emergency department he has no longer had any chest discomfort.  Apparently the chest discomfort came on at rest and was associated with shortness of breath.  He has had no exertional angina, exertional dyspnea, or any problems with completing dialysis due to angina.  Plan: Continue current therapy.  Today: In the interim he was admitted in May 2024 with hypertensive emergency.  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  He was discharged on a regimen including amlodipine 10 mg, clonidine 0.3 mg 3 times daily, labetalol 400 mg twice daily, and nifedipine 30 mg twice daily.  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  He reached out to our office regarding elevated blood pressure.   Current Medications: No outpatient medications have been marked as taking for the 03/19/23 encounter (Appointment) with Orbie Pyo, MD.     Allergies:    Cucumber extract, Other, Tomato (diagnostic), Wild lettuce [wild lettuce extract (lactuca virosa)], and Hydralazine   Social History:   Social History   Tobacco Use   Smoking status: Some Days    Packs/day: .3    Types: Cigarettes    Start date: 1998   Tobacco comments:    Stopped in January  Vaping Use   Vaping Use: Never used  Substance Use Topics   Alcohol use: Not Currently    Alcohol/week: 12.0 standard drinks of alcohol    Types: 12 Cans of beer per week    Comment: stopped heavy drinking 2021   Drug use: Yes    Types: Marijuana     Family Hx: Family History  Problem Relation Age of Onset   Hypertension Mother    Cervical cancer Mother    Breast cancer Sister    Sudden Cardiac Death Brother 84   Heart disease Maternal Grandmother    Cancer Maternal  Grandmother      Review of Systems:   Please see the history of present illness.    All other systems reviewed and are negative.     EKGs/Labs/Other Test Reviewed:    EKG:  EKG performed January 01, 2022 that I personally reviewed demonstrates sinus rhythm with left ventricular hypertrophy and repolarization abnormalities   Prior CV studies:   TTE 2023 demonstrates an ejection fraction of 55% with mild left ventricular hypertrophy, grade 2 diastolic dysfunction, no significant valvular abnormalities.   Other studies Reviewed: Review of the additional studies/records demonstrates: MR abdomen 2023 without aortic atherosclerosis or aneurysm  Recent Labs: 02/12/2023: B Natriuretic Peptide 2,236.4; Magnesium 1.8 02/14/2023: ALT 76; BUN 54; Creatinine, Ser 6.89; Hemoglobin 8.9; Platelets 187; Potassium 3.8; Sodium 131   Recent Lipid Panel Lab Results  Component Value Date/Time   TRIG 370 (H) 12/02/2021 03:05 AM    Risk Assessment/Calculations:           Physical Exam:      Signed, Orbie Pyo, MD  03/17/2023 8:32 AM    [facility] [address] [place], [place], [place]  [address] Phone: [phone]; Fax: [phone]   Note:  This document was prepared using Dragon voice recognition software and may include unintentional dictation errors.

## 2023-03-19 ENCOUNTER — Ambulatory Visit (INDEPENDENT_AMBULATORY_CARE_PROVIDER_SITE_OTHER): Payer: Medicaid Other | Admitting: Internal Medicine

## 2023-03-19 DIAGNOSIS — D631 Anemia in chronic kidney disease: Secondary | ICD-10-CM

## 2023-03-19 DIAGNOSIS — N185 Chronic kidney disease, stage 5: Secondary | ICD-10-CM

## 2023-03-19 DIAGNOSIS — N186 End stage renal disease: Secondary | ICD-10-CM

## 2023-03-19 DIAGNOSIS — R072 Precordial pain: Secondary | ICD-10-CM

## 2023-03-19 DIAGNOSIS — I10 Essential (primary) hypertension: Secondary | ICD-10-CM

## 2023-03-27 NOTE — Progress Notes (Signed)
Cardiology Office Note:    Date:  04/02/2023   ID:  Roy Randolph, DOB 12-01-1975, MRN [id number]  PCP:  Grayce Sessions, NP   [facility] Providers Cardiologist:  Alverda Skeans, MD Referring MD: Marcine Matar, MD   Chief Complaint/Reason for Referral:  Cardiology follow up  PATIENT DID NOT APPEAR FOR APPOINTMENT   ASSESSMENT:    1. Precordial pain   2. End stage kidney disease (HCC)   3. Essential hypertension   4. Anemia of chronic kidney failure, stage 5 (HCC)     PLAN:    In order of problems listed above: 1.  Chest pain: Resolved with regular dialysis.   2.  End-stage renal disease: This is being followed by other providers. 3.  Hypertension: Given very poorly controlled blood pressure will refer to pharmacy for further recommendations.  Consider referral to Dr. Duke Salvia for advanced hypertension clinic in the future if needed. 4.  Anemia of chronic disease: CBC recently was significantly abnormal.  Hemoglobin was around 9 which is acceptable.          History of Present Illness:   FOCUSED PROBLEM LIST:   1.  End-stage renal disease on hemodialysis ([name]) 2.  Hypertension 3.  Anemia of chronic disease 4.  Ongoing tobacco abuse 5.  Prior alcohol abuse 6.  Noncompliance   May 2023: The patient is a 47 y.o. male with the indicated medical history here for emergency room follow-up.  The patient was seen recently in the emergency department due to chest pain.  At that time her blood pressure was 145/94.  Her cardiac biomarkers were mildly abnormal.  An EKG left ventricular hypertrophy with repolarization abnormalities.  A chest x-ray demonstrated mild cardiomegaly and mild central vascular congestion.  Her other laboratories were remarkable for creatinine of 7.31, hemoglobin of 9, and normal glucose level.  She was administered aspirin, as needed nitroglycerin, and clonidine.     When questioned about this episode further apparently this happened  on Sunday night into Monday.  This was during his birthday weekend when the patient ingested more fluid than he normally does.  Once he started dialysis on Tuesday following his presentation emergency department he has no longer had any chest discomfort.  Apparently the chest discomfort came on at rest and was associated with shortness of breath.  He has had no exertional angina, exertional dyspnea, or any problems with completing dialysis due to angina.  Plan: Continue current therapy.   Today: In the interim he was admitted in May 2024 with hypertensive emergency.  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  He was discharged on a regimen including amlodipine 10 mg, clonidine 0.3 mg 3 times daily, labetalol 400 mg twice daily, and nifedipine 30 mg twice daily.  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  He reached out to our office regarding elevated blood pressure.       Previous Medical History: Past Medical History:  Diagnosis Date   Abnormal EKG 10/10/2021   Alcohol use disorder in remission 11/14/2021   Elevated troponin 10/10/2021   ESRD on hemodialysis (HCC) 10/10/2021   History of migraine 11/14/2021   Hypertension    Hypertensive emergency due to non compliance 10/10/2021   Hypokalemia 10/10/2021   Left arm numbness and pain 10/10/2021   Marijuana use 10/10/2021   Noncompliance    NSAID long-term use 10/10/2021   PRES (posterior reversible  encephalopathy syndrome)    Smoker 10/10/2021   Syncope, vasovagal 10/10/2021   Thrombocytopenia (HCC) 10/10/2021   Vision disturbance 11/14/2021     Current Medications: No outpatient medications have been marked as taking for the 04/04/23 encounter (Office Visit) with Orbie Pyo, MD.     Allergies:    Cucumber extract, Other, Tomato (diagnostic), Wild lettuce [wild lettuce extract (lactuca virosa)], and Hydralazine    Social History:   Social History   Tobacco Use   Smoking status: Some Days    Packs/day: .3    Types: Cigarettes    Start date: 1998   Tobacco comments:    Stopped in January  Vaping Use   Vaping Use: Never used  Substance Use Topics   Alcohol use: Not Currently    Alcohol/week: 12.0 standard drinks of alcohol    Types: 12 Cans of beer per week    Comment: stopped heavy drinking 2021   Drug use: Yes    Types: Marijuana     Family Hx: Family History  Problem Relation Age of Onset   Hypertension Mother    Cervical cancer Mother    Breast cancer Sister    Sudden Cardiac Death Brother 38   Heart disease Maternal Grandmother    Cancer Maternal Grandmother      Review of Systems:   Please see the history of present illness.    All other systems reviewed and are negative.     EKGs/Labs/Other Test Reviewed:    EKG:    EKG Interpretation Date/Time:    Ventricular Rate:    PR Interval:    QRS Duration:    QT Interval:    QTC Calculation:   R Axis:      Text Interpretation:           Prior CV studies:  Cardiac Studies & Procedures       ECHOCARDIOGRAM  ECHOCARDIOGRAM COMPLETE 10/11/2021  Narrative ECHOCARDIOGRAM REPORT    Patient Name:   Roy Randolph Date of Exam: 10/11/2021 Medical Rec #:  [id number]          Height:       69.5 in Accession #:    [id number]         Weight:       170.0 lb Date of Birth:  April 26, 1976          BSA:          1.938 m Patient Age:    45 years           BP:           171/112 mmHg Patient Gender: M                  HR:           82 bpm. Exam Location:  Inpatient  Procedure: 2D Echo, Cardiac Doppler and Color Doppler  Indications:    SYNCOPE  History:        Patient has no prior history of Echocardiogram examinations. Abnormal ECG, Signs/Symptoms:Syncope; Risk Factors:Hypertension and Current Smoker. ELEV, TROPONIN / ETOH Tami Ribas.  Sonographer:    Festus Barren Referring Phys: [id number] PRANAV M  PATEL  IMPRESSIONS   1. Left ventricular ejection fraction, by estimation, is 55%. The left ventricle has normal function. The left ventricle has no regional wall motion abnormalities. There is mild left ventricular hypertrophy. Left ventricular diastolic parameters are consistent with Grade II diastolic dysfunction (pseudonormalization). 2. Right ventricular systolic function is normal. The right  ventricular size is normal. Tricuspid regurgitation signal is inadequate for assessing PA pressure. 3. Left atrial size was moderately dilated. 4. The mitral valve is normal in structure. Trivial mitral valve regurgitation. No evidence of mitral stenosis. 5. The aortic valve is tricuspid. Aortic valve regurgitation is trivial. No aortic stenosis is present. 6. The inferior vena cava is normal in size with greater than 50% respiratory variability, suggesting right atrial pressure of 3 mmHg.  FINDINGS Left Ventricle: Left ventricular ejection fraction, by estimation, is 55%. The left ventricle has normal function. The left ventricle has no regional wall motion abnormalities. The left ventricular internal cavity size was normal in size. There is mild left ventricular hypertrophy. Left ventricular diastolic parameters are consistent with Grade II diastolic dysfunction (pseudonormalization).  Right Ventricle: The right ventricular size is normal. No increase in right ventricular wall thickness. Right ventricular systolic function is normal. Tricuspid regurgitation signal is inadequate for assessing PA pressure.  Left Atrium: Left atrial size was moderately dilated.  Right Atrium: Right atrial size was normal in size.  Pericardium: There is no evidence of pericardial effusion.  Mitral Valve: The mitral valve is normal in structure. Trivial mitral valve regurgitation. No evidence of mitral valve stenosis. MV peak gradient, 45.4 mmHg. The mean mitral valve gradient is 18.0 mmHg.  Tricuspid Valve: The  tricuspid valve is normal in structure. Tricuspid valve regurgitation is not demonstrated.  Aortic Valve: The aortic valve is tricuspid. Aortic valve regurgitation is trivial. Aortic regurgitation PHT measures 1460 msec. No aortic stenosis is present. Aortic valve mean gradient measures 4.0 mmHg. Aortic valve peak gradient measures 7.7 mmHg. Aortic valve area, by VTI measures 3.25 cm.  Pulmonic Valve: The pulmonic valve was normal in structure. Pulmonic valve regurgitation is not visualized.  Aorta: The aortic root is normal in size and structure.  Venous: The inferior vena cava is normal in size with greater than 50% respiratory variability, suggesting right atrial pressure of 3 mmHg.  IAS/Shunts: No atrial level shunt detected by color flow Doppler.   LEFT VENTRICLE PLAX 2D LVIDd:         5.00 cm      Diastology LVIDs:         3.50 cm      LV e' medial:    8.55 cm/s LV PW:         1.30 cm      LV E/e' medial:  10.6 LV IVS:        1.30 cm      LV e' lateral:   9.95 cm/s LVOT diam:     2.20 cm      LV E/e' lateral: 9.1 LV SV:         92 LV SV Index:   47 LVOT Area:     3.80 cm  LV Volumes (MOD) LV vol d, MOD A2C: 94.7 ml LV vol d, MOD A4C: 112.0 ml LV vol s, MOD A2C: 37.6 ml LV vol s, MOD A4C: 45.8 ml LV SV MOD A2C:     57.1 ml LV SV MOD A4C:     112.0 ml LV SV MOD BP:      57.9 ml  RIGHT VENTRICLE RV S prime:     16.30 cm/s TAPSE (M-mode): 2.4 cm  LEFT ATRIUM             Index        RIGHT ATRIUM           Index LA diam:  4.10 cm 2.12 cm/m   RA Area:     13.00 cm LA Vol (A2C):   77.6 ml 40.05 ml/m  RA Volume:   26.70 ml  13.78 ml/m LA Vol (A4C):   81.9 ml 42.27 ml/m LA Biplane Vol: 80.0 ml 41.29 ml/m AORTIC VALVE                    PULMONIC VALVE AV Area (Vmax):    2.84 cm     PV Vmax:       0.88 m/s AV Area (Vmean):   2.77 cm     PV Peak grad:  3.1 mmHg AV Area (VTI):     3.25 cm AV Vmax:           139.00 cm/s AV Vmean:          94.900 cm/s AV VTI:             0.283 m AV Peak Grad:      7.7 mmHg AV Mean Grad:      4.0 mmHg LVOT Vmax:         104.00 cm/s LVOT Vmean:        69.200 cm/s LVOT VTI:          0.242 m LVOT/AV VTI ratio: 0.86 AI PHT:            1460 msec  AORTA Ao Root diam: 3.30 cm Ao Asc diam:  3.20 cm  MITRAL VALVE MV Area (PHT): 4.80 cm    SHUNTS MV Area VTI:   1.35 cm    Systemic VTI:  0.24 m MV Peak grad:  45.4 mmHg   Systemic Diam: 2.20 cm MV Mean grad:  18.0 mmHg MV Vmax:       3.37 m/s MV Vmean:      177.0 cm/s MV Decel Time: 158 msec MV E velocity: 90.70 cm/s MV A velocity: 72.80 cm/s MV E/A ratio:  1.25  Dalton McleanMD Electronically signed by Wilfred Lacy Signature Date/Time: 10/11/2021/4:29:31 PM    Final             Other studies Reviewed: Review of the additional studies/records demonstrates: MR abdomen 2023 without aortic atherosclerosis or aneurysm   Recent Labs: 02/12/2023: B Natriuretic Peptide 2,236.4; Magnesium 1.8 02/14/2023: ALT 76; BUN 54; Creatinine, Ser 6.89; Hemoglobin 8.9; Platelets 187; Potassium 3.8; Sodium 131   Recent Lipid Panel Lab Results  Component Value Date/Time   TRIG 370 (H) 12/02/2021 03:05 AM    Risk Assessment/Calculations:      Physical Exam:   Signed, Orbie Pyo, MD  04/02/2023 12:55 PM    [facility] [address] [place], [place], [place]  [address] Phone: [phone]; Fax: [phone]   Note:  This document was prepared using Dragon voice recognition software and may include unintentional dictation errors.

## 2023-03-28 ENCOUNTER — Ambulatory Visit (INDEPENDENT_AMBULATORY_CARE_PROVIDER_SITE_OTHER): Payer: Medicaid Other

## 2023-03-31 ENCOUNTER — Ambulatory Visit: Payer: Medicaid Other | Admitting: Internal Medicine

## 2023-03-31 ENCOUNTER — Ambulatory Visit (INDEPENDENT_AMBULATORY_CARE_PROVIDER_SITE_OTHER): Payer: Medicaid Other

## 2023-04-04 ENCOUNTER — Encounter: Payer: Self-pay | Admitting: Internal Medicine

## 2023-04-04 ENCOUNTER — Ambulatory Visit: Payer: Medicaid Other | Attending: Internal Medicine | Admitting: Internal Medicine

## 2023-04-04 DIAGNOSIS — N186 End stage renal disease: Secondary | ICD-10-CM

## 2023-04-04 DIAGNOSIS — I10 Essential (primary) hypertension: Secondary | ICD-10-CM

## 2023-04-04 DIAGNOSIS — D631 Anemia in chronic kidney disease: Secondary | ICD-10-CM

## 2023-04-04 DIAGNOSIS — N185 Chronic kidney disease, stage 5: Secondary | ICD-10-CM

## 2023-04-04 DIAGNOSIS — R072 Precordial pain: Secondary | ICD-10-CM

## 2023-04-07 ENCOUNTER — Ambulatory Visit (HOSPITAL_BASED_OUTPATIENT_CLINIC_OR_DEPARTMENT_OTHER): Payer: Medicaid Other | Attending: Internal Medicine | Admitting: Internal Medicine

## 2023-04-07 DIAGNOSIS — R0683 Snoring: Secondary | ICD-10-CM | POA: Diagnosis present

## 2023-04-07 DIAGNOSIS — G478 Other sleep disorders: Secondary | ICD-10-CM

## 2023-04-07 DIAGNOSIS — G4733 Obstructive sleep apnea (adult) (pediatric): Secondary | ICD-10-CM | POA: Diagnosis not present

## 2023-04-19 ENCOUNTER — Encounter: Payer: Self-pay | Admitting: Internal Medicine

## 2023-04-19 ENCOUNTER — Other Ambulatory Visit: Payer: Self-pay | Admitting: Internal Medicine

## 2023-04-19 DIAGNOSIS — G4733 Obstructive sleep apnea (adult) (pediatric): Secondary | ICD-10-CM

## 2023-04-19 DIAGNOSIS — R0683 Snoring: Secondary | ICD-10-CM

## 2023-04-19 NOTE — Procedures (Signed)
   Patient Name: Roy Randolph, Roy Randolph Date: 04/07/2023 Gender: Male D.O.B: 1976-07-14 Age (years): 67 Referring Provider: Jonah Blue MD Height (inches): 69 Interpreting Physician: Jetty Duhamel MD, ABSM Weight (lbs): 144 RPSGT: Rosette Reveal BMI: 21 MRN: [id number] Neck Size: 15.00  CLINICAL INFORMATION Sleep Study Type: NPSG Indication for sleep study: Hypertension Epworth Sleepiness Score: 14  SLEEP STUDY TECHNIQUE As per the AASM Manual for the Scoring of Sleep and Associated Events v2.3 (April 2016) with a hypopnea requiring 4% desaturations.  The channels recorded and monitored were frontal, central and occipital EEG, electrooculogram (EOG), submentalis EMG (chin), nasal and oral airflow, thoracic and abdominal wall motion, anterior tibialis EMG, snore microphone, electrocardiogram, and pulse oximetry.  MEDICATIONS Medications self-administered by patient taken the night of the study : none reported  SLEEP ARCHITECTURE The study was initiated at 10:31:43 PM and ended at 4:40:04 AM.  Sleep onset time was 7.7 minutes and the sleep efficiency was 91.7%. The total sleep time was 337.7 minutes.  Stage REM latency was 74.0 minutes.  The patient spent 7.7% of the night in stage N1 sleep, 72.5% in stage N2 sleep, 0.0% in stage N3 and 19.8% in REM.  Alpha intrusion was absent.  Supine sleep was 0.00%.  RESPIRATORY PARAMETERS The overall apnea/hypopnea index (AHI) was 27.7 per hour. There were 59 total apneas, including 58 obstructive, 1 central and 0 mixed apneas. There were 97 hypopneas and 29 RERAs.  The AHI during Stage REM sleep was 30.4 per hour.  AHI while supine was N/A per hour.  The mean oxygen saturation was 93.1%. The minimum SpO2 during sleep was 77.0%.  loud snoring was noted during this study.  CARDIAC DATA The 2 lead EKG demonstrated sinus rhythm. The mean heart rate was 75.8 beats per minute. Other EKG findings include: None.  LEG MOVEMENT  DATA The total PLMS were 0 with a resulting PLMS index of 0.0. Associated arousal with leg movement index was 0.0 .  IMPRESSIONS - Moderate obstructive sleep apnea occurred during this study (AHI = 27.7/h). - No significant central sleep apnea occurred during this study (CAI = 0.2/h). - Moderate oxygen desaturation was noted during this study (Min O2 = 77.0%, Mean 93.1%). - The patient snored with loud snoring volume. - No cardiac abnormalities were noted during this study. - Clinically significant periodic limb movements did not occur during sleep. No significant associated arousals.  DIAGNOSIS - Obstructive Sleep Apnea (G47.33)  RECOMMENDATIONS - Suggest CPAP titration sleep study or autopap. other options would be based on clinical judgment. - Be careful with alcohol, sedatives and other CNS depressants that may worsen sleep apnea and disrupt normal sleep architecture. - Sleep hygiene should be reviewed to assess factors that may improve sleep quality. - Weight management and regular exercise should be initiated or continued if appropriate.  [Electronically signed] 04/19/2023 12:06 PM  Jetty Duhamel MD, ABSM Diplomate, American Board of Sleep Medicine NPI: [id number]                          Jetty Duhamel Diplomate, American Board of Sleep Medicine  ELECTRONICALLY SIGNED ON:  04/19/2023, 12:04 PM [facility] PH: [phone]   FX: [phone] ACCREDITED BY THE AMERICAN ACADEMY OF SLEEP MEDICINE

## 2023-04-20 ENCOUNTER — Telehealth: Payer: Self-pay | Admitting: Internal Medicine

## 2023-04-20 NOTE — Telephone Encounter (Signed)
Phone call placed to patient today to go over the results of recent sleep study.  Confirmed identity using 2 patient identifiers including date of birth. Patient informed that the sleep study shows that he has moderate sleep apnea.  I explained to him what that is and how it is treated using CPAP machine.  Advised that I can send a prescription for the CPAP machine with AutoPap set to 5-20 cm H2O pressure to [facility] unless there is a different medical supplier he would prefer rxn sent to.  Pt agreeable to having rxn sent to [facility].  Advise that they will call him after they receive the rxn from me.  All questions answered.

## 2023-04-21 NOTE — Telephone Encounter (Signed)
Prescription was successfully faxed to [facility] on 04/21/2023.

## 2023-04-28 ENCOUNTER — Emergency Department (HOSPITAL_COMMUNITY): Payer: Medicaid Other

## 2023-04-28 ENCOUNTER — Encounter (HOSPITAL_COMMUNITY): Payer: Self-pay

## 2023-04-28 ENCOUNTER — Other Ambulatory Visit: Payer: Self-pay

## 2023-04-28 ENCOUNTER — Observation Stay (HOSPITAL_COMMUNITY)
Admission: EM | Admit: 2023-04-28 | Discharge: 2023-04-29 | Disposition: A | Discharge status: Home / Self Care | Payer: Medicaid Other | Attending: Internal Medicine | Admitting: Internal Medicine

## 2023-04-28 DIAGNOSIS — R3129 Other microscopic hematuria: Secondary | ICD-10-CM

## 2023-04-28 DIAGNOSIS — R1032 Left lower quadrant pain: Secondary | ICD-10-CM | POA: Diagnosis not present

## 2023-04-28 DIAGNOSIS — N186 End stage renal disease: Secondary | ICD-10-CM | POA: Diagnosis not present

## 2023-04-28 DIAGNOSIS — D500 Iron deficiency anemia secondary to blood loss (chronic): Secondary | ICD-10-CM | POA: Insufficient documentation

## 2023-04-28 DIAGNOSIS — R079 Chest pain, unspecified: Secondary | ICD-10-CM | POA: Diagnosis not present

## 2023-04-28 DIAGNOSIS — Z79899 Other long term (current) drug therapy: Secondary | ICD-10-CM | POA: Insufficient documentation

## 2023-04-28 DIAGNOSIS — R0789 Other chest pain: Principal | ICD-10-CM | POA: Insufficient documentation

## 2023-04-28 DIAGNOSIS — Z992 Dependence on renal dialysis: Secondary | ICD-10-CM | POA: Diagnosis not present

## 2023-04-28 DIAGNOSIS — R7989 Other specified abnormal findings of blood chemistry: Secondary | ICD-10-CM

## 2023-04-28 DIAGNOSIS — I120 Hypertensive chronic kidney disease with stage 5 chronic kidney disease or end stage renal disease: Secondary | ICD-10-CM | POA: Diagnosis not present

## 2023-04-28 LAB — CBC
HCT: 32.4 % — ABNORMAL LOW (ref 39.0–52.0)
Hemoglobin: 10.4 g/dL — ABNORMAL LOW (ref 13.0–17.0)
MCH: 28.7 pg (ref 26.0–34.0)
MCHC: 32.1 g/dL (ref 30.0–36.0)
MCV: 89.3 fL (ref 80.0–100.0)
Platelets: 200 10*3/uL (ref 150–400)
RBC: 3.63 MIL/uL — ABNORMAL LOW (ref 4.22–5.81)
RDW: 16.8 % — ABNORMAL HIGH (ref 11.5–15.5)
WBC: 6.8 10*3/uL (ref 4.0–10.5)
nRBC: 0 % (ref 0.0–0.2)

## 2023-04-28 LAB — TROPONIN I (HIGH SENSITIVITY)
Troponin I (High Sensitivity): 93 ng/L — ABNORMAL HIGH (ref <18)
Troponin I (High Sensitivity): 89 ng/L — ABNORMAL HIGH (ref <18)
Troponin I (High Sensitivity): 88 ng/L — ABNORMAL HIGH (ref <18)

## 2023-04-28 LAB — URINALYSIS, ROUTINE W REFLEX MICROSCOPIC
Bilirubin Urine: NEGATIVE
Glucose, UA: NEGATIVE mg/dL
Ketones, ur: NEGATIVE mg/dL
Leukocytes,Ua: NEGATIVE
Nitrite: NEGATIVE
Protein, ur: >=300 mg/dL — AB
RBC / HPF: >50 RBC/hpf (ref 0–5)
Specific Gravity, Urine: 1.016 (ref 1.005–1.030)
pH: 6 (ref 5.0–8.0)

## 2023-04-28 LAB — BASIC METABOLIC PANEL
Anion gap: 16 — ABNORMAL HIGH (ref 5–15)
BUN: 57 mg/dL — ABNORMAL HIGH (ref 6–20)
CO2: 24 mmol/L (ref 22–32)
Calcium: 9.7 mg/dL (ref 8.9–10.3)
Chloride: 98 mmol/L (ref 98–111)
Creatinine, Ser: 8.28 mg/dL — ABNORMAL HIGH (ref 0.61–1.24)
GFR, Estimated: 7 mL/min — ABNORMAL LOW (ref >60)
Glucose, Bld: 144 mg/dL — ABNORMAL HIGH (ref 70–99)
Potassium: 3.9 mmol/L (ref 3.5–5.1)
Sodium: 138 mmol/L (ref 135–145)

## 2023-04-28 MED ORDER — CLONIDINE HCL 0.2 MG PO TABS
0.3000 mg | ORAL_TABLET | Freq: Once | ORAL | Status: AC
  Administered 2023-04-28: 0.3 mg via ORAL
  Filled 2023-04-28: qty 1

## 2023-04-28 MED ORDER — LABETALOL HCL 200 MG PO TABS
400.0000 mg | ORAL_TABLET | Freq: Two times a day (BID) | ORAL | Status: DC
  Administered 2023-04-28 – 2023-04-29 (×2): 400 mg via ORAL
  Filled 2023-04-28 (×2): qty 2

## 2023-04-28 MED ORDER — AMLODIPINE BESYLATE 10 MG PO TABS
10.0000 mg | ORAL_TABLET | Freq: Every day | ORAL | Status: DC
  Administered 2023-04-29: 10 mg via ORAL
  Filled 2023-04-28: qty 1

## 2023-04-28 MED ORDER — CLONIDINE HCL 0.2 MG PO TABS
0.3000 mg | ORAL_TABLET | Freq: Three times a day (TID) | ORAL | Status: DC
  Administered 2023-04-28 – 2023-04-29 (×3): 0.3 mg via ORAL
  Filled 2023-04-28: qty 2
  Filled 2023-04-28: qty 1
  Filled 2023-04-28: qty 2

## 2023-04-28 MED ORDER — SEVELAMER CARBONATE 800 MG PO TABS
800.0000 mg | ORAL_TABLET | Freq: Three times a day (TID) | ORAL | Status: DC
  Administered 2023-04-28 – 2023-04-29 (×2): 800 mg via ORAL
  Filled 2023-04-28 (×2): qty 1

## 2023-04-28 MED ORDER — MIRTAZAPINE 15 MG PO TABS
7.5000 mg | ORAL_TABLET | Freq: Every day | ORAL | Status: DC
  Administered 2023-04-28: 7.5 mg via ORAL
  Filled 2023-04-28: qty 1

## 2023-04-28 MED ORDER — NIFEDIPINE ER OSMOTIC RELEASE 30 MG PO TB24: 30.0000 mg | ORAL_TABLET | Freq: Two times a day (BID) | ORAL | Status: DC

## 2023-04-28 NOTE — ED Notes (Addendum)
Pt being transferred to inpatient unit at this time via wheelchair with all belongings.

## 2023-04-28 NOTE — ED Provider Notes (Signed)
[employer] EMERGENCY DEPARTMENT AT [facility] Provider Note   CSN: [id number] Arrival date & time: 04/28/23  1047     History  Chief Complaint  Patient presents with   Flank Pain    Roy Randolph is a 47 y.o. male.  47 yo M with a chief complaints of left-sided chest discomfort and not feeling well.  He tells me that this been going on since yesterday.  He gets dialysis Monday Wednesday and Friday but felt too bad to go to dialysis and so came to the emergency department instead.  He does not feel like he is get significant fluid on him.  He has had a change in the color of his urine.  He has had some chronic left lower quadrant abdominal discomfort has been going on for some months now.  He denies fevers but is felt like maybe he has been hot and cold.  No cough or congestion.  Nothing seems to make the chest pain better or worse.   Flank Pain       Home Medications Prior to Admission medications   Medication Sig Start Date End Date Taking? Authorizing Provider  acetaminophen (TYLENOL) 325 MG tablet Take 2 tablets (650 mg total) by mouth every 6 (six) hours as needed for fever or mild pain. 01/02/22   Katsadouros, Vasilios, MD  amLODipine (NORVASC) 10 MG tablet Take 1 tablet (10 mg total) by mouth daily. 02/15/22   Ellison Carwin, MD  carbamide peroxide (DEBROX) 6.5 % OTIC solution Place 5 drops into the right ear 2 (two) times daily. 02/28/23   Grayce Sessions, NP  cloNIDine (CATAPRES) 0.3 MG tablet Take 1 tablet (0.3 mg total) by mouth 3 (three) times daily. 12/05/22   Jacalyn Lefevre, MD  Darbepoetin Alfa (ARANESP) 300 MCG/0.6ML SOSY injection Inject 0.6 mLs (300 mcg total) into the vein every dialysis (Tu,Th,Sat). Patient not taking: Reported on 02/13/2023 01/02/22   Belva Agee, MD  labetalol (NORMODYNE) 200 MG tablet Take 2 tablets (400 mg total) by mouth 2 (two) times daily. 02/15/22   Ellison Carwin, MD  mirtazapine (REMERON) 7.5 MG tablet Take 1 tablet  (7.5 mg total) by mouth at bedtime. Start after weaned off Zoloft. 02/18/23   Marcine Matar, MD  multivitamin (RENA-VIT) TABS tablet Take 1 tablet by mouth daily. Patient not taking: Reported on 02/13/2023    [provider]  NIFEdipine (ADALAT CC) 30 MG 24 hr tablet Take 1 tablet (30 mg total) by mouth every 12 (twelve) hours. 02/18/23   Marcine Matar, MD  oxyCODONE (OXY IR/ROXICODONE) 5 MG immediate release tablet Take 1 tablet (5 mg total) by mouth every 8 (eight) hours as needed for moderate pain. 02/14/23   Ghimire, Werner Lean, MD  pantoprazole (PROTONIX) 40 MG tablet Take 1 tablet (40 mg total) by mouth daily. 02/17/23   Marcine Matar, MD  polyethylene glycol powder (GLYCOLAX/MIRALAX) 17 GM/SCOOP powder Take 17 g by mouth 3 (three) times daily. 02/14/23   Ghimire, Werner Lean, MD  RENVELA 800 MG tablet Take 1 tablet (800 mg total) by mouth 3 (three) times daily. 02/18/23   Marcine Matar, MD  senna-docusate (SENOKOT-S) 8.6-50 MG tablet Take 2 tablets by mouth at bedtime. 02/14/23   Ghimire, Werner Lean, MD  torsemide (DEMADEX) 100 MG tablet Take 100 mg by mouth daily. Take after dialysis on dialysis days    [provider]  Vitamin D, Ergocalciferol, (DRISDOL) 1.25 MG (50000 UNIT) CAPS capsule Take 1 capsule (  50,000 Units total) by mouth once a week. 02/17/23   Marcine Matar, MD      Allergies    Cucumber extract, Other, Tomato (diagnostic), Wild lettuce [wild lettuce extract (lactuca virosa)], and Hydralazine    Review of Systems   Review of Systems  Genitourinary:  Positive for flank pain.    Physical Exam Updated Vital Signs BP (!) 167/99 (BP Location: Right Arm)   Pulse 80   Temp 98.2 F (36.8 C) (Oral)   Resp 16   Ht 5\' 9"  (1.753 m)   Wt 65.3 kg   SpO2 96%   BMI 21.27 kg/m  Physical Exam Vitals and nursing note reviewed.  Constitutional:      Appearance: He is well-developed.  HENT:     Head: Normocephalic and atraumatic.  Eyes:      Pupils: Pupils are equal, round, and reactive to light.  Neck:     Vascular: No JVD.  Cardiovascular:     Rate and Rhythm: Normal rate and regular rhythm.     Heart sounds: No murmur heard.    No friction rub. No gallop.  Pulmonary:     Effort: No respiratory distress.     Breath sounds: No wheezing.  Abdominal:     General: There is no distension.     Tenderness: There is no abdominal tenderness. There is no guarding or rebound.  Musculoskeletal:        General: Normal range of motion.     Cervical back: Normal range of motion and neck supple.     Comments: Left AV fistula with palpable thrill.  Skin:    Coloration: Skin is not pale.     Findings: No rash.  Neurological:     Mental Status: He is alert and oriented to person, place, and time.  Psychiatric:        Behavior: Behavior normal.     ED Results / Procedures / Treatments   Labs (all labs ordered are listed, but only abnormal results are displayed) Labs Reviewed  BASIC METABOLIC PANEL - Abnormal; Notable for the following components:      Result Value   Glucose, Bld 144 (*)    BUN 57 (*)    Creatinine, Ser 8.28 (*)    GFR, Estimated 7 (*)    Anion gap 16 (*)    All other components within normal limits  CBC - Abnormal; Notable for the following components:   RBC 3.63 (*)    Hemoglobin 10.4 (*)    HCT 32.4 (*)    RDW 16.8 (*)    All other components within normal limits  TROPONIN I (HIGH SENSITIVITY) - Abnormal; Notable for the following components:   Troponin I (High Sensitivity) 93 (*)    All other components within normal limits  URINALYSIS, ROUTINE W REFLEX MICROSCOPIC  TROPONIN I (HIGH SENSITIVITY)    EKG EKG Interpretation Date/Time:  Monday April 28 2023 13:00:04 EDT Ventricular Rate:  86 PR Interval:  138 QRS Duration:  96 QT Interval:  354 QTC Calculation: 423 R Axis:   104  Text Interpretation: Normal sinus rhythm Possible Left atrial enlargement Rightward axis Biventricular hypertrophy  Nonspecific ST and T wave abnormality Abnormal ECG ST morphology similar to 09/2021 LVH? Otherwise no significant change Confirmed by Melene Plan [phone]) on 04/28/2023 1:04:20 PM  Radiology CT Renal Stone Study  Result Date: 04/28/2023 CLINICAL DATA:  Abdominal/flank pain, stone suspected. EXAM: CT ABDOMEN AND PELVIS WITHOUT CONTRAST TECHNIQUE: Multidetector CT imaging of the  abdomen and pelvis was performed following the standard protocol without IV contrast. RADIATION DOSE REDUCTION: This exam was performed according to the departmental dose-optimization program which includes automated exposure control, adjustment of the mA and/or kV according to patient size and/or use of iterative reconstruction technique. COMPARISON:  CT angiogram dated 02/11/2023. FINDINGS: Lower chest: Mild bibasilar scarring and/or atelectasis. Hepatobiliary: Previously placed embolization material within the LEFT liver lobe, with cystic changes that developed a site of previous parenchymal hemorrhage. No acute findings are seen in the liver. Gallbladder appears normal.  No bile duct dilatation is seen. Pancreas: Unremarkable. No pancreatic ductal dilatation or surrounding inflammatory changes. Spleen: Normal in size without focal abnormality. Adrenals/Urinary Tract: Adrenal glands appear normal. Kidneys are unremarkable without suspicious mass, stone or hydronephrosis. No ureteral or bladder calculi are identified. Bladder is unremarkable, although decompressed limiting characterization. Stomach/Bowel: No dilated large or small bowel loops. No evidence of a focal or generalized bowel wall inflammation. Appendix appears normal, retrocecal in position. Stomach is unremarkable. Vascular/Lymphatic: No abdominal aortic aneurysm. Extensive aortic atherosclerosis and atherosclerosis of the pelvic vasculature. No enlarged lymph nodes are seen in the abdomen or pelvis. Reproductive: Prostate is unremarkable. Other: No free fluid or abscess  collection is seen. No free intraperitoneal air. Musculoskeletal: Visualized osseous structures of the abdomen and pelvis are unremarkable. IMPRESSION: 1. No acute findings within the abdomen or pelvis. No bowel obstruction or evidence of bowel wall inflammation. No evidence of acute solid organ abnormality. No renal or ureteral calculi. No hydronephrosis. No ascites. 2. Previously placed embolization material within the LEFT liver lobe, with expected post-embolization changes within the LEFT liver lobe. Aortic Atherosclerosis (ICD10-I70.0). Electronically Signed   By: Bary Richard M.D.   On: 04/28/2023 14:08   DG Chest 1 View  Result Date: 04/28/2023 CLINICAL DATA:  Pain EXAM: CHEST  1 VIEW COMPARISON:  Chest x-rays dated 02/11/2023 and 10/10/2021. FINDINGS: Heart size and mediastinal contours appear stable. There are increased interstitial markings bilaterally. No confluent opacity to suggest a consolidating pneumonia. No pleural effusion or pneumothorax is seen. Osseous structures about the chest are unremarkable. IMPRESSION: 1. Increased interstitial markings bilaterally, suspicious for interstitial pulmonary edema superimposed on chronic interstitial lung disease. 2. No evidence of consolidating pneumonia. Electronically Signed   By: Bary Richard M.D.   On: 04/28/2023 14:01    Procedures .Critical Care  Performed by: Melene Plan, DO Authorized by: Melene Plan, DO   Critical care provider statement:    Critical care time (minutes):  35   Critical care time was exclusive of:  Separately billable procedures and treating other patients   Critical care was time spent personally by me on the following activities:  Development of treatment plan with patient or surrogate, discussions with consultants, evaluation of patient's response to treatment, examination of patient, ordering and review of laboratory studies, ordering and review of radiographic studies, ordering and performing treatments and  interventions, pulse oximetry, re-evaluation of patient's condition and review of old charts   Care discussed with: admitting provider       Medications Ordered in ED Medications  cloNIDine (CATAPRES) tablet 0.3 mg (0.3 mg Oral Given 04/28/23 1314)    ED Course/ Medical Decision Making/ A&P                             Medical Decision Making Amount and/or Complexity of Data Reviewed Labs: ordered. Radiology: ordered. ECG/medicine tests: ordered.   47 yo M with a  chief complaints of not feeling well and some left-sided chest discomfort.  This been going on since yesterday.  Nothing seems to make it better or worse.  He says the left lower quadrant pain as well.  He also feels like his urine has changed color.  He gets dialysis Monday Wednesday and Friday.  Missed dialysis today because he did not feel well and came here instead.  Not clinically fluid overloaded on my exam.  No significant anemia, no significant electrolyte abnormality.  With him having chest pain we will obtain a troponin.  Looks like they have been chronically elevated in the past.  CT stone study to evaluate for nephrolithiasis with reported darkening of his urine.  Chest x-ray.  EKG.  Patient is troponins a bit higher than his baseline.  I discussed the case with cardiology on-call spoke with [facility].  He reviewed the EKG he recommended repeating.  He thought that the troponin could be elevated secondary to the patient's chronic renal dysfunction.  Felt reasonable to admit to medicine for serial troponins.  CT scan of the abdomen pelvis without obvious intra-abdominal pathology.  No kidney stones.  Chest x-ray independently interpreted by me without obvious focal infiltrate or pneumothorax.  The patients results and plan were reviewed and discussed.   Any x-rays performed were independently reviewed by myself.   Differential diagnosis were considered with the presenting HPI.  Medications  cloNIDine (CATAPRES) tablet  0.3 mg (0.3 mg Oral Given 04/28/23 1314)    Vitals:   04/28/23 1113 04/28/23 1118 04/28/23 1349  BP: (!) 192/114  (!) 167/99  Pulse: 90  80  Resp: 18  16  Temp: 98.2 F (36.8 C)  98.2 F (36.8 C)  TempSrc:   Oral  SpO2: 91%  96%  Weight:  65.3 kg   Height:  5\' 9"  (1.753 m)     Final diagnoses:  Nonspecific chest pain  LLQ abdominal pain  Troponin level elevated    Admission/ observation were discussed with the admitting physician, patient and/or family and they are comfortable with the plan.          Final Clinical Impression(s) / ED Diagnoses Final diagnoses:  Nonspecific chest pain  LLQ abdominal pain  Troponin level elevated    Rx / DC Orders ED Discharge Orders     None         Melene Plan, DO 04/28/23 1516

## 2023-04-28 NOTE — ED Notes (Signed)
Delay in transfer to unit at this time.  Awaiting transport; this RN unable to leave [facility] to assist with transport at current time.

## 2023-04-28 NOTE — H&P (Signed)
Date: 04/28/2023               Patient Name:  Roy Randolph MRN: [id number]  DOB: July 13, 1976 Age / Sex: 47 y.o., male   PCP: Grayce Sessions, NP         Medical Service: Internal Medicine Teaching Service         Attending Physician: Dr. Gust Rung, DO      First Contact: Dr. Laretta Bolster, MD Pager [phone]    Second Contact: Dr. Olegario Messier, MD Pager [phone]         After Hours (After 5p/  First Contact Pager: [phone]  weekends / holidays): Second Contact Pager: [phone]   SUBJECTIVE   Chief Complaint: Chest Pain   History of Present Illness:   Roy Randolph is a 47 year old male with past medical history of ESRD on HD MTTS, uncontrolled HTN, history of hepatic aneurysm s/p embolization who presents with chest pain. Pt states he is having left sided chest discomfort and is generally not feeling well. The pain is located in the mid-sternal area and is non-radiating. He denis any shortness of breath, back pain, nausea, or vomiting.   He is also reporting left lower quadrant pain. Pain is mild and intermittent.  Patient also has increased urinary frequency, and hematuria.  He denies blood clots in the urine.  He also denies penile discharge.  ED Course:  Meds:  Amlodipine 10mg   Clonidine .3mg   Labetalol 200mg   Remeron 7.5mg   Nifedipine 30mg   Oxycoodne 5mg   Protonix 40mg   Renvela 800mg   Torsemide 100mg   Past Medical History  ESRD on HD  Congestive heart failure Poorly Controlled Hypertension  Hx of hepatic artery aneurysm s/p embolization  Past Surgical History:  Procedure Laterality Date   AV FISTULA PLACEMENT Left 01/01/2022   Procedure: LEFT ARM BRACHIOCEPHALIC ARTERIOVENOUS  FISTULA CREATION;  Surgeon: Maeola Harman, MD;  Location: [facility] OR;  Service: Vascular;  Laterality: Left;   IR ANGIOGRAM VISCERAL SELECTIVE  02/09/2023   IR ANGIOGRAM VISCERAL SELECTIVE  02/09/2023   IR ANGIOGRAM VISCERAL SELECTIVE  02/09/2023   IR EMBO ART   VEN HEMORR LYMPH EXTRAV  INC GUIDE ROADMAPPING  02/09/2023   IR FLUORO GUIDE CV LINE RIGHT  12/24/2021   IR FLUORO GUIDE CV LINE RIGHT  12/27/2021   IR US GUIDE VASC ACCESS RIGHT  12/24/2021   IR US GUIDE VASC ACCESS RIGHT  02/09/2023   THORACOTOMY Right     Social:  Lives With: By himself Occupation: On disability Support: Friends in the area Level of Function: Independent in all ADLs and IADLs  PCP: Gwinda Passe, NP Substances: Remote history of smoking and using illicit substaces   Family History:   Mother - cervical cancer  Sister - breast cancer  Sudden cardiac death Brother   Allergies: Allergies as of 04/28/2023 - Review Complete 04/28/2023  Allergen Reaction Noted   Cucumber extract Anaphylaxis and Itching 11/30/2021   Other Anaphylaxis, Itching, Swelling, and Other (See Comments) 11/30/2021   Tomato (diagnostic) Anaphylaxis and Swelling 10/10/2021   Wild lettuce [wild lettuce extract (lactuca virosa)] Anaphylaxis and Swelling 10/10/2021   Hydralazine Hives, Itching, and Other (See Comments) 11/30/2021    Review of Systems: A complete ROS was negative except as per HPI.   OBJECTIVE:   Physical Exam: Blood pressure (!) 167/99, pulse 80, temperature 98.2 F (36.8 C), temperature source Oral, resp. rate 16, height 5\' 9"  (1.753 m), weight 65.3 kg, SpO2 96%.  Constitutional: Appears  older than stated age male, in no acute distress HENT: normocephalic atraumatic, mucous membranes moist Eyes: conjunctiva non-erythematous Neck: supple Cardiovascular: regular rate and rhythm, no m/r/g Pulmonary/Chest: normal work of breathing on room air, lungs clear to auscultation bilaterally Abdominal: soft, non-tender, non-distended MSK: normal bulk and tone Neurological: alert & oriented x 3, 5/5 strength in bilateral upper and lower extremities, normal gait Skin: warm and dry, LUE fistula palpable thrill Psych: normal mood and affect  Labs: CBC    Component Value Date/Time    WBC 6.8 04/28/2023 1127   RBC 3.63 (L) 04/28/2023 1127   HGB 10.4 (L) 04/28/2023 1127   HGB 9.0 (L) 01/18/2022 1146   HCT 32.4 (L) 04/28/2023 1127   HCT 28.1 (L) 01/18/2022 1146   PLT 200 04/28/2023 1127   PLT 227 01/18/2022 1146   MCV 89.3 04/28/2023 1127   MCV 82 01/18/2022 1146   MCH 28.7 04/28/2023 1127   MCHC 32.1 04/28/2023 1127   RDW 16.8 (H) 04/28/2023 1127   RDW 18.8 (H) 01/18/2022 1146   LYMPHSABS 0.8 02/14/2023 0638   MONOABS 2.6 (H) 02/14/2023 0638   EOSABS 0.1 02/14/2023 0638   BASOSABS 0.0 02/14/2023 0638     CMP     Component Value Date/Time   NA 138 04/28/2023 1127   NA 133 (L) 01/18/2022 1146   K 3.9 04/28/2023 1127   CL 98 04/28/2023 1127   CO2 24 04/28/2023 1127   GLUCOSE 144 (H) 04/28/2023 1127   BUN 57 (H) 04/28/2023 1127   BUN 26 (H) 01/18/2022 1146   CREATININE 8.28 (H) 04/28/2023 1127   CALCIUM 9.7 04/28/2023 1127   PROT 7.2 02/14/2023 0638   ALBUMIN 2.4 (L) 02/14/2023 0638   AST 69 (H) 02/14/2023 0638   ALT 76 (H) 02/14/2023 0638   ALKPHOS 228 (H) 02/14/2023 0638   BILITOT 1.6 (H) 02/14/2023 2130   GFRNONAA 7 (L) 04/28/2023 1127    Imaging:  EKG: personally reviewed my interpretation is normal sinus rhythm with nonspecific ST and T wave abnormalities..  ASSESSMENT & PLAN:   Assessment & Plan by Problem: Principal Problem:   Chest pain Active Problems:   Microscopic hematuria   Roy Randolph is a 47 y.o. person living with a history of ESRD, congestive heart failure who presented with chest pain and hematuria and admitted for chest pain.   Chest Pain On presentation in ED, he was reporting midsternal chest pain that is localized.  No recent trauma. He had EKG that showed normal sinus rhythm with nonspecific ST and T wave abnormality.  No evidence of ischemic changes concerning for an ACS etiology.  Initial troponin was elevated, but has flattened.  Cardiology consulted, and are not super impressed.  No need to trend troponin  since patient is no longer experiencing chest pain. -Monitor  2. Microscopic Hematuria  -Reporting 1 day history of dysuria and hematuria, without blood clots. Possible etiologies of microscopic hematuria includes stone in the bladder or the urethra or an infection.  There was evidence of > 50 rbc/pfp field.  CT renal stone was negative for renal or ureteral calculi. Seen by urology in 12/26/22 for hematuria, recommended point cytoscopy. Previous scrotal ultrasound showed bilateral testicular microlithiasis, left-sided varicocele, and small right sided testicular cyst.  -Monitor CBC  -Follow-up with urology outpatient.  3. ESRD on HDAnemia Patient is on dialysis 4 days a week, MTTS. Hemoglobin stable at 10.4, and anemia is secondary to chronic kidney disease. Euvolemic on exam. - monitor CBC  and BMP. - continue Renvela 800mg . - continue Torsemide 100mg   - Consult nephrology if he stays longer than 07/30.  4. Congestive Heart disease HTN  History of congestive heart failure, secondary to end-stage renal disease.  He is on multiple blood pressure medications.  Blood pressures have been stable.   -Continue amlodipine 10mg , clonidine .3mg , and Labetalol 200mg  .  Chronic conditions  5. GERD -Continue pantoprazole.  6. Metabolic bone disease -Continue Renvela.   Diet: Normal VTE: None IVF: None,None Code: Full  Prior to Admission Living Arrangement: Home, living by himself Anticipated Discharge Location: Home Barriers to Discharge: None    Signed: Laretta Bolster, M.D.  Internal Medicine Resident, PGY-1 [facility]  Pager: [phone] 5:48 PM, 04/28/2023   Please contact the on call pager after 5 pm and on weekends at [phone].**  04/28/2023, 5:07 PM

## 2023-04-28 NOTE — ED Notes (Signed)
ED TO INPATIENT HANDOFF REPORT  ED Nurse Name and Phone #: [phone]  S Name/Age/Gender Roy Randolph 47 y.o. male Room/Bed: 052C/052C  Code Status   Code Status: Full Code  Home/SNF/Other Home Patient oriented to: self, place, time, and situation Is this baseline? Yes   Triage Complete: Triage complete  Chief Complaint Chest pain [R07.9]  Triage Note Reports LLQ and L flank pain with hematuria that started yesterday. Patient is a dialysis patient.    Allergies Allergies  Allergen Reactions   Cucumber Extract Anaphylaxis and Itching   Other Anaphylaxis, Itching, Swelling and Other (See Comments)    The patient CANNOT eat raw/fresh fruit or vegetables. Reaction starts with ears itching, then progresses from there. He can eat canned versions of these foods, however.   Tomato (Diagnostic) Anaphylaxis and Swelling   Wild Lettuce [Wild Lettuce Extract (Lactuca Virosa)] Anaphylaxis and Swelling   Hydralazine Hives, Itching and Other (See Comments)    SEVERE ITCHING    Level of Care/Admitting Diagnosis ED Disposition     ED Disposition  Admit   Condition  --   Comment  Hospital Area: [facility] [[id number]]  Level of Care: Med-Surg [16]  May place patient in observation at [facility] or [facility] if equivalent level of care is available:: No  Covid Evaluation: Asymptomatic - no recent exposure (last 10 days) testing not required  Diagnosis: Chest pain [454098]  Admitting Physician: Silvio Pate  Attending Physician: Silvio Pate          B Medical/Surgery History Past Medical History:  Diagnosis Date   Abnormal EKG 10/10/2021   Alcohol use disorder in remission 11/14/2021   Elevated troponin 10/10/2021   ESRD on hemodialysis (HCC) 10/10/2021   History of migraine 11/14/2021   Hypertension    Hypertensive emergency due to non compliance 10/10/2021   Hypokalemia 10/10/2021   Left arm numbness and pain 10/10/2021    Marijuana use 10/10/2021   Noncompliance    NSAID long-term use 10/10/2021   PRES (posterior reversible encephalopathy syndrome)    Smoker 10/10/2021   Syncope, vasovagal 10/10/2021   Thrombocytopenia (HCC) 10/10/2021   Vision disturbance 11/14/2021   Past Surgical History:  Procedure Laterality Date   AV FISTULA PLACEMENT Left 01/01/2022   Procedure: LEFT ARM BRACHIOCEPHALIC ARTERIOVENOUS  FISTULA CREATION;  Surgeon: Maeola Harman, MD;  Location: [facility] OR;  Service: Vascular;  Laterality: Left;   IR ANGIOGRAM VISCERAL SELECTIVE  02/09/2023   IR ANGIOGRAM VISCERAL SELECTIVE  02/09/2023   IR ANGIOGRAM VISCERAL SELECTIVE  02/09/2023   IR EMBO ART  VEN HEMORR LYMPH EXTRAV  INC GUIDE ROADMAPPING  02/09/2023   IR FLUORO GUIDE CV LINE RIGHT  12/24/2021   IR FLUORO GUIDE CV LINE RIGHT  12/27/2021   IR US GUIDE VASC ACCESS RIGHT  12/24/2021   IR US GUIDE VASC ACCESS RIGHT  02/09/2023   THORACOTOMY Right      A IV Location/Drains/Wounds Patient Lines/Drains/Airways Status     Active Line/Drains/Airways     Name Placement date Placement time Site Days   Peripheral IV 04/28/23 20 G Anterior;Right Forearm 04/28/23  1612  Forearm  less than 1   Fistula / Graft Left Upper arm Arteriovenous fistula 01/01/22  1226  Upper arm  482            Intake/Output Last 24 hours No intake or output data in the 24 hours ending 04/28/23 1909  Labs/Imaging Results for orders placed or performed  during the hospital encounter of 04/28/23 (from the past 48 hour(s))  Urinalysis, Routine w reflex microscopic -Urine, Clean Catch     Status: Abnormal   Collection Time: 04/28/23 11:20 AM  Result Value Ref Range   Color, Urine AMBER (A) YELLOW    Comment: BIOCHEMICALS MAY BE AFFECTED BY COLOR   APPearance HAZY (A) CLEAR   Specific Gravity, Urine 1.016 1.005 - 1.030   pH 6.0 5.0 - 8.0   Glucose, UA NEGATIVE NEGATIVE mg/dL   Hgb urine dipstick LARGE (A) NEGATIVE   Bilirubin Urine NEGATIVE NEGATIVE    Ketones, ur NEGATIVE NEGATIVE mg/dL   Protein, ur >=161 (A) NEGATIVE mg/dL   Nitrite NEGATIVE NEGATIVE   Leukocytes,Ua NEGATIVE NEGATIVE   RBC / HPF >50 0 - 5 RBC/hpf   WBC, UA 11-20 0 - 5 WBC/hpf   Bacteria, UA RARE (A) NONE SEEN   Squamous Epithelial / HPF 0-5 0 - 5 /HPF    Comment: Performed at [facility], [address] [place], [place] [address]  Basic metabolic panel     Status: Abnormal   Collection Time: 04/28/23 11:27 AM  Result Value Ref Range   Sodium 138 135 - 145 mmol/L   Potassium 3.9 3.5 - 5.1 mmol/L   Chloride 98 98 - 111 mmol/L   CO2 24 22 - 32 mmol/L   Glucose, Bld 144 (H) 70 - 99 mg/dL    Comment: Glucose reference range applies only to samples taken after fasting for at least 8 hours.   BUN 57 (H) 6 - 20 mg/dL   Creatinine, Ser 5.40 (H) 0.61 - 1.24 mg/dL   Calcium 9.7 8.9 - 98.1 mg/dL   GFR, Estimated 7 (L) >60 mL/min    Comment: (NOTE) Calculated using the CKD-EPI Creatinine Equation (2021)    Anion gap 16 (H) 5 - 15    Comment: Performed at [facility], [address], [place], [place] [address]  CBC     Status: Abnormal   Collection Time: 04/28/23 11:27 AM  Result Value Ref Range   WBC 6.8 4.0 - 10.5 K/uL   RBC 3.63 (L) 4.22 - 5.81 MIL/uL   Hemoglobin 10.4 (L) 13.0 - 17.0 g/dL   HCT 82.9 (L) 56.2 - 13.0 %   MCV 89.3 80.0 - 100.0 fL   MCH 28.7 26.0 - 34.0 pg   MCHC 32.1 30.0 - 36.0 g/dL   RDW 86.5 (H) 78.4 - 69.6 %   Platelets 200 150 - 400 K/uL   nRBC 0.0 0.0 - 0.2 %    Comment: Performed at [facility], [address], [place], [place] [address]  Troponin I (High Sensitivity)     Status: Abnormal   Collection Time: 04/28/23 11:46 AM  Result Value Ref Range   Troponin I (High Sensitivity) 93 (H) <18 ng/L    Comment: (NOTE) Elevated high sensitivity troponin I (hsTnI) values and significant  changes across serial measurements may suggest ACS but many other  chronic and acute conditions are known to elevate hsTnI results.   Refer to the "Links" section for chest pain algorithms and additional  guidance. Performed at [facility], [address] [place], [place] [address]   Troponin I (High Sensitivity)     Status: Abnormal   Collection Time: 04/28/23  2:25 PM  Result Value Ref Range   Troponin I (High Sensitivity) 89 (H) <18 ng/L    Comment: (NOTE) Elevated high sensitivity troponin I (hsTnI)  values and significant  changes across serial measurements may suggest ACS but many other  chronic and acute conditions are known to elevate hsTnI results.  Refer to the "Links" section for chest pain algorithms and additional  guidance. Performed at [facility], [address], [place], [place] [address]   Troponin I (High Sensitivity)     Status: Abnormal   Collection Time: 04/28/23  5:34 PM  Result Value Ref Range   Troponin I (High Sensitivity) 88 (H) <18 ng/L    Comment: (NOTE) Elevated high sensitivity troponin I (hsTnI) values and significant  changes across serial measurements may suggest ACS but many other  chronic and acute conditions are known to elevate hsTnI results.  Refer to the "Links" section for chest pain algorithms and additional  guidance. Performed at [facility], [address], [place], [place] [address]    CT Renal Stone Study  Result Date: 04/28/2023 CLINICAL DATA:  Abdominal/flank pain, stone suspected. EXAM: CT ABDOMEN AND PELVIS WITHOUT CONTRAST TECHNIQUE: Multidetector CT imaging of the abdomen and pelvis was performed following the standard protocol without IV contrast. RADIATION DOSE REDUCTION: This exam was performed according to the departmental dose-optimization program which includes automated exposure control, adjustment of the mA and/or kV according to patient size and/or use of iterative reconstruction technique. COMPARISON:  CT angiogram dated 02/11/2023. FINDINGS: Lower chest: Mild bibasilar scarring and/or atelectasis. Hepatobiliary: Previously  placed embolization material within the LEFT liver lobe, with cystic changes that developed a site of previous parenchymal hemorrhage. No acute findings are seen in the liver. Gallbladder appears normal.  No bile duct dilatation is seen. Pancreas: Unremarkable. No pancreatic ductal dilatation or surrounding inflammatory changes. Spleen: Normal in size without focal abnormality. Adrenals/Urinary Tract: Adrenal glands appear normal. Kidneys are unremarkable without suspicious mass, stone or hydronephrosis. No ureteral or bladder calculi are identified. Bladder is unremarkable, although decompressed limiting characterization. Stomach/Bowel: No dilated large or small bowel loops. No evidence of a focal or generalized bowel wall inflammation. Appendix appears normal, retrocecal in position. Stomach is unremarkable. Vascular/Lymphatic: No abdominal aortic aneurysm. Extensive aortic atherosclerosis and atherosclerosis of the pelvic vasculature. No enlarged lymph nodes are seen in the abdomen or pelvis. Reproductive: Prostate is unremarkable. Other: No free fluid or abscess collection is seen. No free intraperitoneal air. Musculoskeletal: Visualized osseous structures of the abdomen and pelvis are unremarkable. IMPRESSION: 1. No acute findings within the abdomen or pelvis. No bowel obstruction or evidence of bowel wall inflammation. No evidence of acute solid organ abnormality. No renal or ureteral calculi. No hydronephrosis. No ascites. 2. Previously placed embolization material within the LEFT liver lobe, with expected post-embolization changes within the LEFT liver lobe. Aortic Atherosclerosis (ICD10-I70.0). Electronically Signed   By: Bary Richard M.D.   On: 04/28/2023 14:08   DG Chest 1 View  Result Date: 04/28/2023 CLINICAL DATA:  Pain EXAM: CHEST  1 VIEW COMPARISON:  Chest x-rays dated 02/11/2023 and 10/10/2021. FINDINGS: Heart size and mediastinal contours appear stable. There are increased interstitial  markings bilaterally. No confluent opacity to suggest a consolidating pneumonia. No pleural effusion or pneumothorax is seen. Osseous structures about the chest are unremarkable. IMPRESSION: 1. Increased interstitial markings bilaterally, suspicious for interstitial pulmonary edema superimposed on chronic interstitial lung disease. 2. No evidence of consolidating pneumonia. Electronically Signed   By: Bary Richard M.D.   On: 04/28/2023 14:01    Pending Labs [employer] (From admission, onward)     Start     Ordered  04/29/23 0500  Basic metabolic panel  Daily,   R      04/28/23 1724   04/29/23 0500  CBC  Daily,   R      04/28/23 1724   04/29/23 0500  CBC with Differential  Tomorrow morning,   R        04/28/23 1842            Vitals/Pain Today's Vitals   04/28/23 1113 04/28/23 1118 04/28/23 1349  BP: (!) 192/114  (!) 167/99  Pulse: 90  80  Resp: 18  16  Temp: 98.2 F (36.8 C)  98.2 F (36.8 C)  TempSrc:   Oral  SpO2: 91%  96%  Weight:  65.3 kg   Height:  5\' 9"  (1.753 m)   PainSc:  8      Isolation Precautions No active isolations  Medications Medications  amLODipine (NORVASC) tablet 10 mg (10 mg Oral Not Given 04/28/23 1744)  cloNIDine (CATAPRES) tablet 0.3 mg (0.3 mg Oral Given by Other 04/28/23 1849)  labetalol (NORMODYNE) tablet 400 mg (has no administration in time range)  mirtazapine (REMERON) tablet 7.5 mg (has no administration in time range)  sevelamer carbonate (RENVELA) tablet 800 mg (800 mg Oral Given by Other 04/28/23 1849)  cloNIDine (CATAPRES) tablet 0.3 mg (0.3 mg Oral Given 04/28/23 1314)    Mobility walks     Focused Assessments Renal Assessment Handoff:  Hemodialysis Schedule: Hemodialysis Schedule: Monday/Wednesday/Friday Last Hemodialysis date and time: Friday. Didn't go today because he wasn't feeling well.   Restricted appendage: left arm   R Recommendations: See Admitting Provider Note  Report given to:   Additional Notes: Pt  pleasant. A&Ox4, ambulatory, MWF dialysis w/ fistula to L arm.

## 2023-04-28 NOTE — ED Triage Notes (Signed)
Reports LLQ and L flank pain with hematuria that started yesterday. Patient is a dialysis patient.

## 2023-04-28 NOTE — ED Notes (Signed)
This RN will transport this pt to floor once [facility] picks up another pt in assignment.  Transport unavailable to assist.  This RN unable to leave unit until other pt is picked up due to need for monitoring on other pt in assignment.  Receiving RN sent secure chat about delay.

## 2023-04-29 DIAGNOSIS — R3129 Other microscopic hematuria: Secondary | ICD-10-CM | POA: Diagnosis not present

## 2023-04-29 DIAGNOSIS — Z992 Dependence on renal dialysis: Secondary | ICD-10-CM | POA: Diagnosis not present

## 2023-04-29 DIAGNOSIS — N186 End stage renal disease: Secondary | ICD-10-CM | POA: Diagnosis not present

## 2023-04-29 DIAGNOSIS — R079 Chest pain, unspecified: Secondary | ICD-10-CM | POA: Diagnosis not present

## 2023-04-29 NOTE — ED Notes (Signed)
Pt provided with juice and dietary dinner bag delivered prior.

## 2023-04-29 NOTE — Plan of Care (Signed)

## 2023-04-29 NOTE — TOC Initial Note (Signed)
Transition of Care [facility]) - Initial/Assessment Note    Patient Details  Name: Roy Randolph MRN: [id number] Date of Birth: [birth year]  [facility] CM/SW Contact:    Leone Haven, RN Phone Number: 04/29/2023, 9:54 AM  Clinical Narrative:                 From home with sister, pta self ambulatory, he has PCP and insurance on file. He states he is calling a ub[employer] to transport him home.  He will be getting his outpt dialysis today at 61.  He does not have any HH services in place or DME at home.  His sister is his support system.   Expected Discharge Plan: Home/Self Care Barriers to Discharge: No Barriers Identified   Patient Goals and CMS Choice Patient states their goals for this hospitalization and ongoing recovery are:: return home   Choice offered to / list presented to : NA      Expected Discharge Plan and Services In-house Referral: NA Discharge Planning Services: CM Consult Post Acute Care Choice: NA Living arrangements for the past 2 months: Single Family Home Expected Discharge Date: 04/29/23               DME Arranged: N/A DME Agency: NA       HH Arranged: NA          Prior Living Arrangements/Services Living arrangements for the past 2 months: Single Family Home Lives with:: Siblings (sister) Patient language and need for interpreter reviewed:: Yes Do you feel safe going back to the place where you live?: Yes      Need for Family Participation in Patient Care: Yes (Comment) Care giver support system in place?: Yes (comment)   Criminal Activity/Legal Involvement Pertinent to Current Situation/Hospitalization: No - Comment as needed  Activities of Daily Living      Permission Sought/Granted                  Emotional Assessment Appearance:: Appears stated age Attitude/Demeanor/Rapport: Engaged Affect (typically observed): Appropriate Orientation: : Oriented to Self, Oriented to Place, Oriented to  Time, Oriented to  Situation Alcohol / Substance Use: Not Applicable Psych Involvement: No (comment)  Admission diagnosis:  LLQ abdominal pain [R10.32] Troponin level elevated [R79.89] Chest pain [R07.9] Nonspecific chest pain [R07.9] Patient Active Problem List   Diagnosis Date Noted   Microscopic hematuria 04/28/2023   OSA (obstructive sleep apnea) 04/19/2023   Sepsis due to undetermined organism (HCC) 02/11/2023   Hypertensive emergency 02/09/2023   Moderate major depression (HCC) 12/14/2022   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 AT [address] [facility] [address] [place] [address] Phone: [phone] Fax: [phone]     Social Determinants of Health (SDOH) Social History: SDOH Screenings   Food Insecurity: No Food Insecurity (02/27/2023)  Housing: Low Risk  (02/09/2023)  Transportation Needs: Unmet Transportation Needs (02/27/2023)  Utilities: Not At  Risk (02/09/2023)  Depression (PHQ2-9): Medium Risk (02/28/2023)  Financial Resource Strain: High Risk (02/27/2023)  Social Connections: Socially Isolated (02/27/2023)  Tobacco Use: High Risk (04/28/2023)   SDOH Interventions:     Readmission Risk Interventions    12/28/2021    1:23 PM  Readmission Risk Prevention Plan  Transportation Screening Complete  PCP or Specialist Appt within 5-7 Days Complete  Home Care Screening Complete   Medication Review (RN CM) Complete

## 2023-04-29 NOTE — ED Notes (Signed)
Pt received for care at this time.  Bed in lowest position, wheels locked.  Call bell within reach.

## 2023-04-29 NOTE — TOC Transition Note (Signed)
Transition of Care [facility]) - CM/SW Discharge Note   Patient Details  Name: Roy Randolph MRN: [id number] Date of Birth: [birth year]  Transition [facility] Care [facility]) CM/SW Contact:  Leone Haven, RN Phone Number: 04/29/2023, 9:56 AM   Clinical Narrative:    For dc today, he has no needs.   Final next level of care: Home/Self Care Barriers to Discharge: No Barriers Identified   Patient Goals and CMS Choice   Choice offered to / list presented to : NA  Discharge Placement                         Discharge Plan and Services Additional resources added to the After Visit Summary for   In-house Referral: NA Discharge Planning Services: CM Consult Post Acute Care Choice: NA          DME Arranged: N/A DME Agency: NA       HH Arranged: NA          Social Determinants of Health (SDOH) Interventions SDOH Screenings   Food Insecurity: No Food Insecurity (02/27/2023)  Housing: Low Risk  (02/09/2023)  Transportation Needs: Unmet Transportation Needs (02/27/2023)  Utilities: Not At Risk (02/09/2023)  Depression (PHQ2-9): Medium Risk (02/28/2023)  Financial Resource Strain: High Risk (02/27/2023)  Social Connections: Socially Isolated (02/27/2023)  Tobacco Use: High Risk (04/28/2023)     Readmission Risk Interventions    12/28/2021    1:23 PM  Readmission Risk Prevention Plan  Transportation Screening Complete  PCP or Specialist Appt within 5-7 Days Complete  Home Care Screening Complete  Medication Review (RN CM) Complete

## 2023-04-29 NOTE — Discharge Summary (Addendum)
Name: Roy Randolph MRN: [id number] DOB: 02-Feb-1976 47 y.o. PCP: Grayce Sessions, NP  Date of Admission: 04/28/2023 11:05 AM Date of Discharge: 04/29/23 Attending Physician: Dr. Cleda Daub  Discharge Diagnosis: Principal Problem:   Chest pain Active Problems:   Microscopic hematuria    Discharge Medications: Allergies as of 04/29/2023       Reactions   Cucumber Extract Anaphylaxis, Itching   Other Anaphylaxis, Itching, Swelling, Other (See Comments)   The patient CANNOT eat raw/fresh fruit or vegetables. Reaction starts with ears itching, then progresses from there. He can eat canned versions of these foods, however.   Tomato (diagnostic) Anaphylaxis, Swelling   Wild Lettuce [wild Lettuce Extract (lactuca Virosa)] Anaphylaxis, Swelling   Hydralazine Hives, Itching, Other (See Comments)   SEVERE ITCHING        Medication List     STOP taking these medications    acetaminophen 325 MG tablet Commonly known as: TYLENOL   NIFEdipine 30 MG 24 hr tablet Commonly known as: ADALAT CC   oxyCODONE 5 MG immediate release tablet Commonly known as: Oxy IR/ROXICODONE       TAKE these medications    amLODipine 10 MG tablet Commonly known as: NORVASC Take 1 tablet (10 mg total) by mouth daily.   cloNIDine 0.3 MG tablet Commonly known as: CATAPRES Take 1 tablet (0.3 mg total) by mouth 3 (three) times daily.   Darbepoetin Alfa 300 MCG/0.6ML Sosy injection Commonly known as: ARANESP Inject 0.6 mLs (300 mcg total) into the vein every dialysis (Tu,Th,Sat).   Debrox 6.5 % OTIC solution Generic drug: carbamide peroxide Place 5 drops into the right ear 2 (two) times daily.   labetalol 200 MG tablet Commonly known as: NORMODYNE Take 2 tablets (400 mg total) by mouth 2 (two) times daily.   mirtazapine 7.5 MG tablet Commonly known as: REMERON Take 1 tablet (7.5 mg total) by mouth at bedtime. Start after weaned off Zoloft.   pantoprazole 40 MG tablet Commonly  known as: PROTONIX Take 1 tablet (40 mg total) by mouth daily.   polyethylene glycol powder 17 GM/SCOOP powder Commonly known as: GLYCOLAX/MIRALAX Take 17 g by mouth 3 (three) times daily.   Renvela 800 MG tablet Generic drug: sevelamer carbonate Take 1 tablet (800 mg total) by mouth 3 (three) times daily.   Senexon-S 8.6-50 MG tablet Generic drug: senna-docusate Take 2 tablets by mouth at bedtime.   torsemide 100 MG tablet Commonly known as: DEMADEX Take 100 mg by mouth See admin instructions. Take after dialysis on dialysis days Monday , Tuesday, Thursday and Friday   Vitamin D (Ergocalciferol) 1.25 MG (50000 UNIT) Caps capsule Commonly known as: DRISDOL Take 1 capsule (50,000 Units total) by mouth once a week.        Disposition and follow-up:   [name] was discharged from [facility] in Fair condition.  At the hospital follow up visit please address:  1.  Follow-up:  a.  Microscopic hematuria -Had resolved at the time of discharge. Urology recommending outpatient follow-up.  2.  Labs / imaging needed at time of follow-up: CBC and UA   Follow-up Appointments:  Follow-up Information     Grayce Sessions, NP Follow up on 05/16/2023.   Specialty: Internal Medicine Why: @9 :30am Contact information: [address] [place] [address] [phone]                 Hospital Course by problem list: #Chest Pain # Elevated troponin On presentation in  ED, he was reporting localized midsternal chest   He had EKG done, and was normal.  No evidence of ischemic changes concerning for an ACS etiology.  Initial troponin was elevated, but has flattened.  He is no longer complaining of chest pain.  # Microscopic Hematuria  -  Patient reported discolored urine.  UA revealed microscopic hematuria and pyuria. CT renal stone was normal.  No evidence of renal or ureteral calculi. Recommend following up with urology outpatient.  # ESRD  on HD chronic anemia Patient is on dialysis 4x days a week, MTTS. Hemoglobin stable at 10.4. Electrolytes are stable.  Euvolemic on exam  # Congestive Heart disease HTN  Blood pressures have been stable. -Continue amlodipine 10mg , clonidine .3mg , and Labetalol 200mg .  # GERD -Continue pantoprazole.   # Metabolic bone disease -Continue Renvela    Discharge Subjective: Patient evaluated at bedside this AM.  Discharge Exam:   BP (!) 193/122 (BP Location: Left Arm)   Pulse 89   Temp 97.9 F (36.6 C) (Oral)   Resp 20   Ht 5\' 9"  (1.753 m)   Wt 67.1 kg   SpO2 94%   BMI 21.85 kg/m   Constitutional: well-appearing not in acute distress. HENT: normocephalic atraumatic, mucous membranes moist Cardiovascular: regular rate and rhythm, no m/r/g Pulmonary/Chest: normal work of breathing on room air, lungs clear to auscultation bilaterally Abdominal: soft, non-tender, non-distended Skin: warm and dry Psych: Appropriate mood and affect.  Pertinent Labs, Studies, and Procedures:     Latest Ref Rng & Units 04/29/2023    3:04 AM 04/28/2023   11:27 AM 02/14/2023    6:38 AM  CBC  WBC 4.0 - 10.5 K/uL 5.6  6.8  14.3   Hemoglobin 13.0 - 17.0 g/dL 9.2  16.1  8.9   Hematocrit 39.0 - 52.0 % 28.4  32.4  26.6   Platelets 150 - 400 K/uL 166  200  187        Latest Ref Rng & Units 04/29/2023    3:04 AM 04/28/2023   11:27 AM 02/14/2023    6:38 AM  CMP  Glucose 70 - 99 mg/dL 98  096  045   BUN 6 - 20 mg/dL 61  57  54   Creatinine 0.61 - 1.24 mg/dL 4.09  8.11  9.14   Sodium 135 - 145 mmol/L 138  138  131   Potassium 3.5 - 5.1 mmol/L 3.5  3.9  3.8   Chloride 98 - 111 mmol/L 97  98  90   CO2 22 - 32 mmol/L 26  24  25    Calcium 8.9 - 10.3 mg/dL 9.2  9.7  8.9   Total Protein 6.5 - 8.1 g/dL   7.2   Total Bilirubin 0.3 - 1.2 mg/dL   1.6   Alkaline Phos 38 - 126 U/L   228   AST 15 - 41 U/L   69   ALT 0 - 44 U/L   76     CT Renal Stone Study  Result Date: 04/28/2023 CLINICAL DATA:   Abdominal/flank pain, stone suspected. EXAM: CT ABDOMEN AND PELVIS WITHOUT CONTRAST TECHNIQUE: Multidetector CT imaging of the abdomen and pelvis was performed following the standard protocol without IV contrast. RADIATION DOSE REDUCTION: This exam was performed according to the departmental dose-optimization program which includes automated exposure control, adjustment of the mA and/or kV according to patient size and/or use of iterative reconstruction technique. COMPARISON:  CT angiogram dated 02/11/2023. FINDINGS: Lower chest: Mild bibasilar scarring  and/or atelectasis. Hepatobiliary: Previously placed embolization material within the LEFT liver lobe, with cystic changes that developed a site of previous parenchymal hemorrhage. No acute findings are seen in the liver. Gallbladder appears normal.  No bile duct dilatation is seen. Pancreas: Unremarkable. No pancreatic ductal dilatation or surrounding inflammatory changes. Spleen: Normal in size without focal abnormality. Adrenals/Urinary Tract: Adrenal glands appear normal. Kidneys are unremarkable without suspicious mass, stone or hydronephrosis. No ureteral or bladder calculi are identified. Bladder is unremarkable, although decompressed limiting characterization. Stomach/Bowel: No dilated large or small bowel loops. No evidence of a focal or generalized bowel wall inflammation. Appendix appears normal, retrocecal in position. Stomach is unremarkable. Vascular/Lymphatic: No abdominal aortic aneurysm. Extensive aortic atherosclerosis and atherosclerosis of the pelvic vasculature. No enlarged lymph nodes are seen in the abdomen or pelvis. Reproductive: Prostate is unremarkable. Other: No free fluid or abscess collection is seen. No free intraperitoneal air. Musculoskeletal: Visualized osseous structures of the abdomen and pelvis are unremarkable. IMPRESSION: 1. No acute findings within the abdomen or pelvis. No bowel obstruction or evidence of bowel wall  inflammation. No evidence of acute solid organ abnormality. No renal or ureteral calculi. No hydronephrosis. No ascites. 2. Previously placed embolization material within the LEFT liver lobe, with expected post-embolization changes within the LEFT liver lobe. Aortic Atherosclerosis (ICD10-I70.0). Electronically Signed   By: Bary Richard M.D.   On: 04/28/2023 14:08   DG Chest 1 View  Result Date: 04/28/2023 CLINICAL DATA:  Pain EXAM: CHEST  1 VIEW COMPARISON:  Chest x-rays dated 02/11/2023 and 10/10/2021. FINDINGS: Heart size and mediastinal contours appear stable. There are increased interstitial markings bilaterally. No confluent opacity to suggest a consolidating pneumonia. No pleural effusion or pneumothorax is seen. Osseous structures about the chest are unremarkable. IMPRESSION: 1. Increased interstitial markings bilaterally, suspicious for interstitial pulmonary edema superimposed on chronic interstitial lung disease. 2. No evidence of consolidating pneumonia. Electronically Signed   By: Bary Richard M.D.   On: 04/28/2023 14:01     Discharge Instructions: Discharge Instructions     Diet - low sodium heart healthy   Complete by: As directed    Increase activity slowly   Complete by: As directed        Signed: Laretta Bolster, MD 04/29/2023, 11:20 AM   Pager: [phone]

## 2023-04-29 NOTE — Discharge Instructions (Addendum)
You were admitted for chest pain. Labs and imaging were normal. -Please continue to monitor. -Follow-up with your PCP if you have a crushing chest pain.  You were also admitted for blood in your urine. There was no infection in the urine. -Please follow-up with urology outpatient.  You are also on dialysis 4x days a week, MTTS.  -You have dialysis today.

## 2023-04-30 ENCOUNTER — Telehealth: Payer: Self-pay

## 2023-04-30 DIAGNOSIS — F321 Major depressive disorder, single episode, moderate: Secondary | ICD-10-CM

## 2023-04-30 DIAGNOSIS — I10 Essential (primary) hypertension: Secondary | ICD-10-CM

## 2023-04-30 MED ORDER — MIRTAZAPINE 7.5 MG PO TABS
7.5000 mg | ORAL_TABLET | Freq: Every day | ORAL | 1 refills | Status: AC
Start: 2023-04-30 — End: ?

## 2023-04-30 MED ORDER — AMLODIPINE BESYLATE 10 MG PO TABS: 10.0000 mg | ORAL_TABLET | Freq: Every day | ORAL | 0 refills | Status: AC

## 2023-04-30 MED ORDER — VITAMIN D (ERGOCALCIFEROL) 1.25 MG (50000 UNIT) PO CAPS: 50000.0000 [IU] | ORAL_CAPSULE | ORAL | 0 refills | Status: AC

## 2023-04-30 MED ORDER — RENVELA 800 MG PO TABS: 800.0000 mg | ORAL_TABLET | Freq: Three times a day (TID) | ORAL | 4 refills | Status: AC

## 2023-04-30 NOTE — Telephone Encounter (Signed)
Copied from CRM [id number]. Topic: General - Other >> Apr 30, 2023  9:36 AM Phill Myron wrote: The CPAP prescription that was successfully faxed to [facility] on 04/21/2023. Is missing the numerical pressure setting.Marland Kitchen which is a requirement for his insurance.  Fax # [phone] (Although NP Randa Evens is the PCP, It looks like Dr Laural Benes was over this matter)

## 2023-04-30 NOTE — Telephone Encounter (Signed)
Called & spoke to Roy Randolph at [facility]. Informed that the pressure setting was indeed sent. Informed that is is located in the comment section at [employer] 5 to 20 cm of water pressure. Roy Randolph stated that she was able to locate it and will inform the corresponding team to reprocess the order. No further questions at this time.

## 2023-04-30 NOTE — Telephone Encounter (Signed)
From [facility] call:  he said he still has " blood color" in his urine but otherwise, no complaints.   he said he is out of all medications except labetalol and clonidine and needs refills for all meds.  He did not have any questions about the med regime.  Follow-up appointment with Gwinda Passe, NP- 05/16/23

## 2023-04-30 NOTE — Telephone Encounter (Signed)
Yes ma'am, rxns sent.

## 2023-04-30 NOTE — Transitions of Care (Post Inpatient/ED Visit) (Signed)
   04/30/2023  Name: Roy Randolph MRN: [id number] DOB: Apr 09, 1976  Today's TOC FU Call Status: Today's TOC FU Call Status:: Successful TOC FU Call Competed TOC FU Call Complete Date: 04/30/23  Transition Care Management Follow-up Telephone Call Date of Discharge: 04/29/23 Discharge Facility: [facility]) Type of Discharge: Inpatient Admission Primary Inpatient Discharge Diagnosis:: chest pain How have you been since you were released from the hospital?: Better Any questions or concerns?: Yes Patient Questions/Concerns:: he said he still has " blood color" in his urine but otherwise, no complaints. Patient Questions/Concerns Addressed: Notified Provider of Patient Questions/Concerns  Items Reviewed: Did you receive and understand the discharge instructions provided?: Yes Medications obtained,verified, and reconciled?: Partial Review Completed Reason for Partial Mediation Review: he said he is out of all medications except labetalol and clonidine and needs refills for all meds.  He did not have any questions about the med regime. Any new allergies since your discharge?: No Dietary orders reviewed?: Yes Type of Diet Ordered:: heart healthy, low sodium, renal Do you have support at home?: Yes People in Home: sibling(s) Name of Support/Comfort Primary Source: his sister  Medications Reviewed Today: Medications Reviewed Today   Medications were not reviewed in this encounter     Home Care and Equipment/Supplies: Were Home Health Services Ordered?: No Any new equipment or medical supplies ordered?: No  Functional Questionnaire: Do you need assistance with bathing/showering or dressing?: No Do you need assistance with meal preparation?: Yes (He stated that he and his sister take turns preparing meals.) Do you need assistance with eating?: No Do you have difficulty maintaining continence: No Do you need assistance with getting out of bed/getting out of a chair/moving?:  No Do you have difficulty managing or taking your medications?: No  Follow up appointments reviewed: PCP Follow-up appointment confirmed?: Yes Date of PCP follow-up appointment?: 05/16/23 Follow-up Provider: Gwinda Passe, NP Specialist Hospital Follow-up appointment confirmed?: NA (no specialist appointment scheduled.  he has dialysis M/T/T/S) Do you need transportation to your follow-up appointment?: No Do you understand care options if your condition(s) worsen?: Yes-patient verbalized understanding    SIGNATURE Robyne Peers, RN

## 2023-04-30 NOTE — Telephone Encounter (Signed)
I called patient and informed him that rx have been sent to his pharmacy and I instructed him to call this clinic back if he has any further questions.  He was very Adult nurse

## 2023-05-16 ENCOUNTER — Inpatient Hospital Stay (INDEPENDENT_AMBULATORY_CARE_PROVIDER_SITE_OTHER): Payer: Medicaid Other | Admitting: Primary Care

## 2023-07-01 ENCOUNTER — Ambulatory Visit (INDEPENDENT_AMBULATORY_CARE_PROVIDER_SITE_OTHER): Payer: Medicaid Other | Admitting: Primary Care

## 2024-03-19 IMAGING — US IR FLUORO GUIDE CV LINE*R*
1 series · 1 of 1 positions shown · non-contrast
Comparison: None.

INDICATION: 45-year-old male with acute on chronic renal failure.

EXAM:
NON-TUNNELED CENTRAL VENOUS HEMODIALYSIS CATHETER PLACEMENT WITH
ULTRASOUND AND FLUOROSCOPIC GUIDANCE

[Series 1: ir fluoro guide cv line*right* · 1 of 1 slices shown]
[im 1/1]
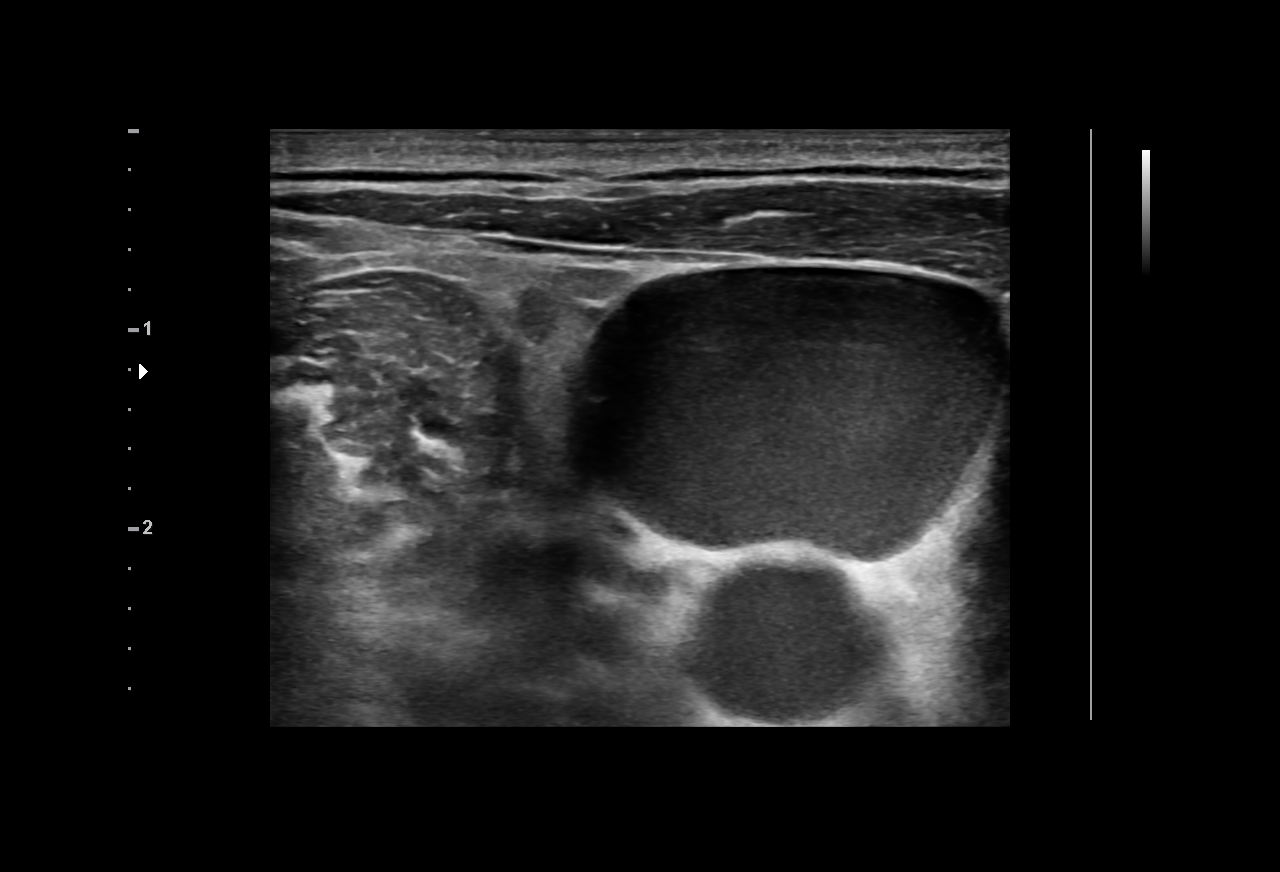

[1 of 1 positions shown; findings below may reference images not displayed]

MEDICATIONS:
None

FLUOROSCOPY TIME:  0 minutes, 6 seconds (1 mGy)

COMPLICATIONS:
None immediate.



After the overlying soft tissues were anesthetized, a small venotomy
incision was created and a micropuncture kit was utilized to access
the internal jugular vein. Real-time ultrasound guidance was
utilized for vascular access including the acquisition of a
permanent ultrasound image documenting patency of the accessed
vessel. The microwire was utilized to measure appropriate catheter
length.

A Rosen wire was advanced to the level of the IVC. Under
fluoroscopic guidance, the venotomy was serially dilated, ultimately
allowing placement of a 15 cm temporary Trialysis catheter with tip
ultimately terminating within the superior aspect of the right
atrium. Final catheter positioning was confirmed and documented with
a spot radiographic image. The catheter aspirates and flushes
normally. The catheter was flushed with appropriate volume heparin
dwells.

The catheter exit site was secured with a 0-Prolene retention
suture. A dressing was placed. The patient tolerated the procedure
well without immediate post procedural complication.
IMPRESSION: Successful placement of a right internal jugular approach 15 cm
temporary dialysis catheter with tip terminating with in the
superior aspect of the right atrium. The catheter is ready for
immediate use.

PLAN:
This catheter may be converted to a tunneled dialysis catheter at a
later date as indicated.

## 2024-03-19 IMAGING — DX DG CHEST 1V PORT
1 series · 1 of 1 positions shown · non-contrast
Comparison: November 30, 2021.

CLINICAL DATA: Hemoptysis.

EXAM:
PORTABLE CHEST 1 VIEW

[chest ap]
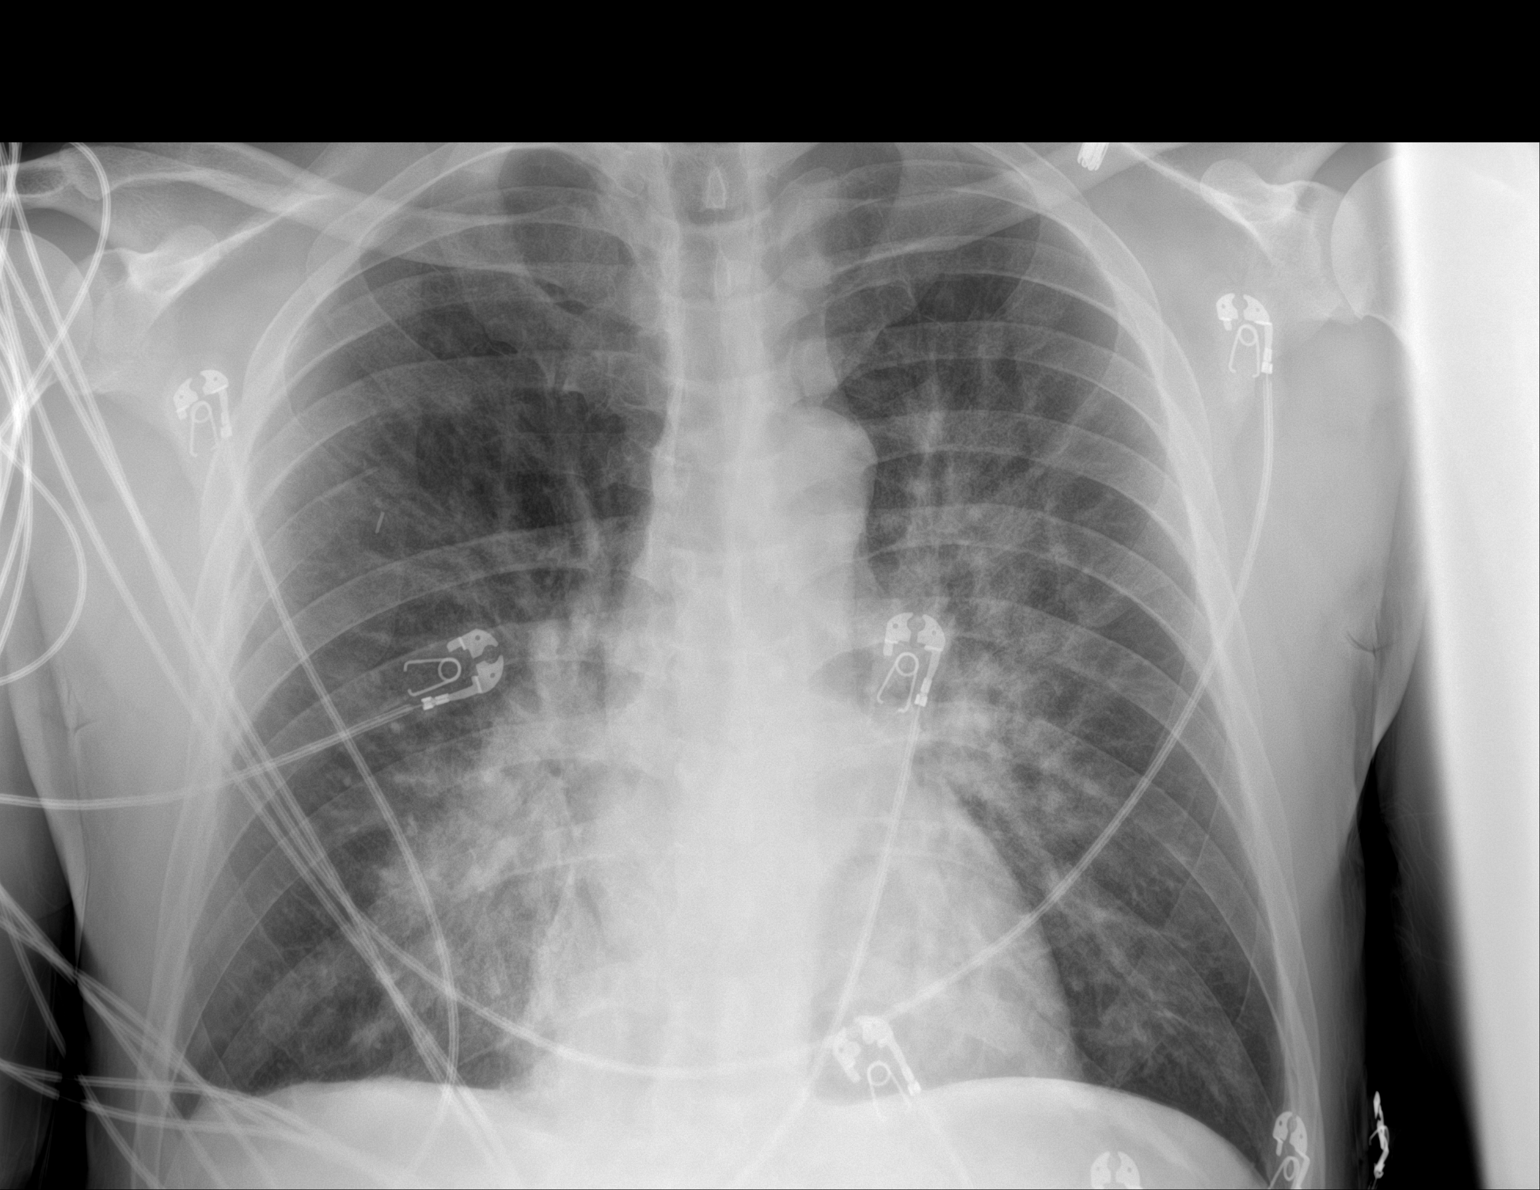

[1 of 1 positions shown; findings below may reference images not displayed]

FINDINGS: The heart size and mediastinal contours are within normal limits.
Interval development of left perihilar and right infrahilar
opacities are noted concerning for pneumonia. The visualized
skeletal structures are unremarkable.
IMPRESSION: Interval development of left perihilar and right infrahilar
opacities concerning for pneumonia. Followup PA and lateral chest
X-ray is recommended in 3-4 weeks following trial of antibiotic
therapy to ensure resolution and exclude underlying malignancy.

## 2024-04-25 IMAGING — DX DG CHEST 1V PORT
1 series · 1 of 1 positions shown · non-contrast
Comparison: Chest radiograph dated 12/26/2021.

CLINICAL DATA: Chest pain.

EXAM:
PORTABLE CHEST 1 VIEW

[chest ap]
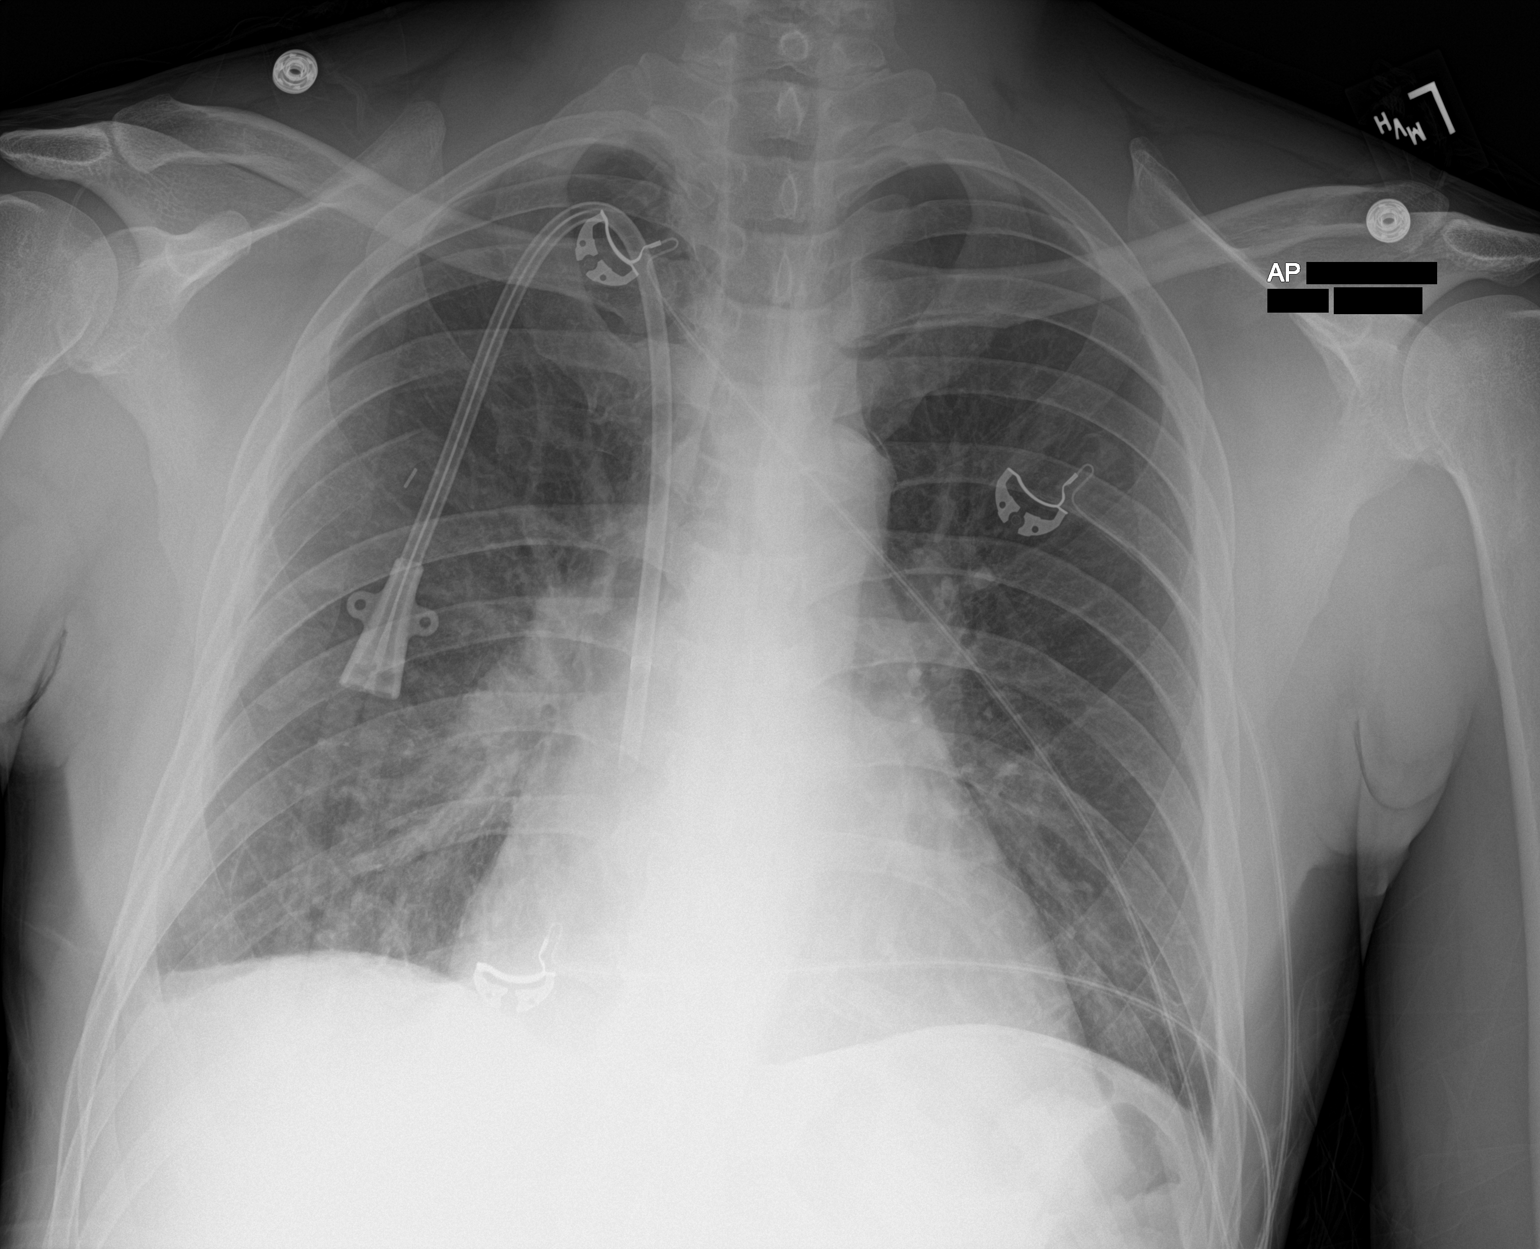

[1 of 1 positions shown; findings below may reference images not displayed]

FINDINGS: Dialysis catheter with tip at the cavoatrial junction. There is mild
cardiomegaly with mild central vascular congestion. Probable trace
right pleural effusion. No focal consolidation or pneumothorax. No
acute osseous pathology.
IMPRESSION: Mild cardiomegaly with mild central vascular congestion and probable
trace right pleural effusion.
